# Patient Record
Sex: Male | Born: 1945 | Race: White | Hispanic: No | Marital: Married | Smoking: Former smoker
Health system: Southern US, Community
[De-identification: ages and names within clinical notes are randomized; demographics above are authoritative.]

## PROBLEM LIST (undated history)

## (undated) DIAGNOSIS — Z8711 Personal history of peptic ulcer disease: Secondary | ICD-10-CM

## (undated) DIAGNOSIS — R195 Other fecal abnormalities: Secondary | ICD-10-CM

## (undated) DIAGNOSIS — B9681 Helicobacter pylori [H. pylori] as the cause of diseases classified elsewhere: Secondary | ICD-10-CM

## (undated) DIAGNOSIS — I255 Ischemic cardiomyopathy: Secondary | ICD-10-CM

## (undated) DIAGNOSIS — Z720 Tobacco use: Secondary | ICD-10-CM

## (undated) DIAGNOSIS — Z8739 Personal history of other diseases of the musculoskeletal system and connective tissue: Secondary | ICD-10-CM

## (undated) DIAGNOSIS — G4730 Sleep apnea, unspecified: Secondary | ICD-10-CM

## (undated) DIAGNOSIS — E785 Hyperlipidemia, unspecified: Secondary | ICD-10-CM

## (undated) DIAGNOSIS — I2510 Atherosclerotic heart disease of native coronary artery without angina pectoris: Secondary | ICD-10-CM

## (undated) DIAGNOSIS — K2970 Gastritis, unspecified, without bleeding: Secondary | ICD-10-CM

## (undated) DIAGNOSIS — T7840XA Allergy, unspecified, initial encounter: Secondary | ICD-10-CM

## (undated) DIAGNOSIS — K250 Acute gastric ulcer with hemorrhage: Secondary | ICD-10-CM

## (undated) DIAGNOSIS — G4733 Obstructive sleep apnea (adult) (pediatric): Secondary | ICD-10-CM

## (undated) DIAGNOSIS — I10 Essential (primary) hypertension: Secondary | ICD-10-CM

## (undated) DIAGNOSIS — R011 Cardiac murmur, unspecified: Secondary | ICD-10-CM

## (undated) DIAGNOSIS — F1020 Alcohol dependence, uncomplicated: Secondary | ICD-10-CM

## (undated) DIAGNOSIS — F418 Other specified anxiety disorders: Secondary | ICD-10-CM

## (undated) DIAGNOSIS — C4492 Squamous cell carcinoma of skin, unspecified: Secondary | ICD-10-CM

## (undated) DIAGNOSIS — J3089 Other allergic rhinitis: Secondary | ICD-10-CM

## (undated) DIAGNOSIS — Z8679 Personal history of other diseases of the circulatory system: Secondary | ICD-10-CM

## (undated) DIAGNOSIS — Z8614 Personal history of Methicillin resistant Staphylococcus aureus infection: Secondary | ICD-10-CM

## (undated) DIAGNOSIS — Z8774 Personal history of (corrected) congenital malformations of heart and circulatory system: Secondary | ICD-10-CM

## (undated) HISTORY — DX: Alcohol dependence, uncomplicated: F10.20

## (undated) HISTORY — PX: MOHS SURGERY: SUR867

## (undated) HISTORY — DX: Sleep apnea, unspecified: G47.30

## (undated) HISTORY — DX: Other fecal abnormalities: R19.5

## (undated) HISTORY — PX: COLONOSCOPY: SHX174

## (undated) HISTORY — DX: Allergy, unspecified, initial encounter: T78.40XA

## (undated) HISTORY — DX: Personal history of other diseases of the circulatory system: Z86.79

## (undated) HISTORY — DX: Essential (primary) hypertension: I10

## (undated) HISTORY — DX: Other allergic rhinitis: J30.89

## (undated) HISTORY — DX: Obstructive sleep apnea (adult) (pediatric): G47.33

## (undated) HISTORY — DX: Tobacco use: Z72.0

## (undated) HISTORY — DX: Gastritis, unspecified, without bleeding: K29.70

## (undated) HISTORY — DX: Personal history of peptic ulcer disease: Z87.11

## (undated) HISTORY — DX: Acute gastric ulcer with hemorrhage: K25.0

## (undated) HISTORY — DX: Personal history of other diseases of the musculoskeletal system and connective tissue: Z87.39

## (undated) HISTORY — DX: Helicobacter pylori (H. pylori) as the cause of diseases classified elsewhere: B96.81

## (undated) HISTORY — DX: Other specified anxiety disorders: F41.8

## (undated) HISTORY — DX: Hyperlipidemia, unspecified: E78.5

## (undated) HISTORY — DX: Cardiac murmur, unspecified: R01.1

## (undated) HISTORY — DX: Personal history of (corrected) congenital malformations of heart and circulatory system: Z87.74

## (undated) HISTORY — DX: Atherosclerotic heart disease of native coronary artery without angina pectoris: I25.10

## (undated) HISTORY — PX: OTHER SURGICAL HISTORY: SHX169

## (undated) HISTORY — DX: Personal history of Methicillin resistant Staphylococcus aureus infection: Z86.14

## (undated) HISTORY — DX: Ischemic cardiomyopathy: I25.5

## (undated) HISTORY — PX: UVULECTOMY: SHX2631

---
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
## 1964-10-05 HISTORY — PX: ASD REPAIR, OSTIUM PRIMUM: SHX1194

## 1964-10-05 HISTORY — PX: OTHER SURGICAL HISTORY: SHX169

## 1998-10-05 HISTORY — PX: PALATE / UVULA BIOPSY / EXCISION: SUR128

## 1999-10-06 DIAGNOSIS — K250 Acute gastric ulcer with hemorrhage: Secondary | ICD-10-CM

## 1999-10-06 HISTORY — DX: Acute gastric ulcer with hemorrhage: K25.0

## 2004-09-22 ENCOUNTER — Ambulatory Visit: Payer: Self-pay | Admitting: Internal Medicine

## 2007-07-12 ENCOUNTER — Ambulatory Visit: Payer: Self-pay | Admitting: Gastroenterology

## 2008-03-27 ENCOUNTER — Ambulatory Visit: Payer: Self-pay | Admitting: Internal Medicine

## 2010-12-18 ENCOUNTER — Ambulatory Visit: Payer: Self-pay | Admitting: Gastroenterology

## 2010-12-22 LAB — PATHOLOGY REPORT

## 2011-10-06 HISTORY — PX: EAR BIOPSY: SHX1480

## 2012-06-23 DIAGNOSIS — Z85828 Personal history of other malignant neoplasm of skin: Secondary | ICD-10-CM | POA: Insufficient documentation

## 2014-03-08 ENCOUNTER — Ambulatory Visit: Payer: Self-pay | Admitting: Gastroenterology

## 2014-10-15 ENCOUNTER — Encounter: Payer: Self-pay | Admitting: Internal Medicine

## 2014-10-15 ENCOUNTER — Ambulatory Visit (INDEPENDENT_AMBULATORY_CARE_PROVIDER_SITE_OTHER): Payer: Commercial Managed Care - HMO | Admitting: Internal Medicine

## 2014-10-15 VITALS — BP 130/80 | HR 90 | Temp 97.6°F | Resp 20 | Ht 71.85 in | Wt 221.8 lb

## 2014-10-15 DIAGNOSIS — C801 Malignant (primary) neoplasm, unspecified: Secondary | ICD-10-CM

## 2014-10-15 DIAGNOSIS — K5909 Other constipation: Secondary | ICD-10-CM | POA: Insufficient documentation

## 2014-10-15 DIAGNOSIS — Z720 Tobacco use: Secondary | ICD-10-CM | POA: Insufficient documentation

## 2014-10-15 DIAGNOSIS — Z9989 Dependence on other enabling machines and devices: Secondary | ICD-10-CM

## 2014-10-15 DIAGNOSIS — I10 Essential (primary) hypertension: Secondary | ICD-10-CM

## 2014-10-15 DIAGNOSIS — R739 Hyperglycemia, unspecified: Secondary | ICD-10-CM

## 2014-10-15 DIAGNOSIS — K5900 Constipation, unspecified: Secondary | ICD-10-CM

## 2014-10-15 DIAGNOSIS — G4733 Obstructive sleep apnea (adult) (pediatric): Secondary | ICD-10-CM

## 2014-10-15 DIAGNOSIS — E785 Hyperlipidemia, unspecified: Secondary | ICD-10-CM | POA: Insufficient documentation

## 2014-10-15 DIAGNOSIS — Z23 Encounter for immunization: Secondary | ICD-10-CM

## 2014-10-15 DIAGNOSIS — IMO0002 Reserved for concepts with insufficient information to code with codable children: Secondary | ICD-10-CM | POA: Insufficient documentation

## 2014-10-15 MED ORDER — ZOSTER VACCINE LIVE 19400 UNT/0.65ML ~~LOC~~ SOLR: 0.6500 mL | Freq: Once | SUBCUTANEOUS | Status: DC

## 2014-10-15 NOTE — Patient Instructions (Signed)
Please bring me a copy of your living will and health care power of attorney and DNR forms.

## 2014-10-15 NOTE — Progress Notes (Signed)
Patient ID: Mitchell Yagi., male   DOB: Oct 21, 1945, 69 y.o.   MRN: [id number]   Location:  [facility]  Code Status: Has living will and HCPOA.    No Known Allergies  Chief Complaint  Patient presents with  . Establish Care    HPI: Patient is a 69 y.o. white male seen in the office today to establish here.    His physician moved to administration so needs new physician.  Wanted internist/geriatrician.    No concerns, but needs referral to Dr. Allyson Sabal, et al, for f/u visit in Feb/March for skin lesion on leg and has new area on right forearm.Had squamous on nose, side of eye and left upper ear.    Has been on verapamil and hctz for many years for his blood pressure--probably 30-35 years.  Had been borderline at that point.    BP stays 130/80 or lower.    Hyperlipidemia--zocor just changed from vytorin previously.  Labs had been great on the vytorin.     Sleep apnea--diagnosed 9-10 years ago.  Uses his CPAP each night.  Starts at 4, but titrates up to 6.8-30mmHg.  Seems to require less sleep since using--6.5-7 hrs.    Smokes about 1 pack every 4 days.  Has held onto this.  Previously drank heavily.  Tries to avoid using strong pain medications for pain except with surgery.  Is wintering with wife at [place] to walk 3 miles per day while there.  Also golfs in good weather.  Otherwise sedentary.  Does use stairs instead of elevator.    Diet was good until Christmas--gained 5-6 lbs over the holidays.  Tries to watch it.  Loves sweets.  No diabetes in family.  Has run at the upper limits of normal with glucose typically.  Has never been over 130.  He would like to lose 10-15 lbs.  Cscope negative for polyps last year (7/15).  Was a diagnostic cscope b/c had polyps in the past--none on the last two.  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  Goes every 3 years recently.  Dr. Candace Cruise.    Review of Systems:  Review of Systems    HENT: Positive for tinnitus. Negative for hearing loss.        Saw ENT about this and was advised probably meniere's--forgets about it so "not that bad"  Eyes: Positive for blurred vision.       Time for visit--has been 4-5 years since contact Rx changed;  Has bad astigmatism  Respiratory: Positive for wheezing.        None recently  Cardiovascular: Positive for palpitations.       Hypertension  Gastrointestinal: Positive for constipation.       Uses senna--only constipated for the past couple of years  Musculoskeletal: Positive for back pain and joint pain.       Primarily right knee after walking when golfing;  Some aches too;  L5 disc protrusion (cannot lift heavy) and goes out and down right leg sometimes--sees chiropractor--had myelogram at [facility] many years ago.    Endo/Heme/Allergies: Positive for environmental allergies.       And typically gets sinus infection also  Psychiatric/Behavioral: Negative for depression and memory loss. The patient is not nervous/anxious.        Had a bout of depression over a year ago, sought help--used zoloft low dose, then wellbutrin which he is also using for smoking cessation   Past  Medical History  Diagnosis Date  . Hypertension   . Hyperlipidemia   . Obstructive sleep apnea     on CPAP  . Gastric ulcer, acute with hemorrhage 2001    Dr. Sharlet Salina, [facility]    Past Surgical History  Procedure Laterality Date  . Palate / uvula biopsy / excision  2000    Dr.Sprenhe [facility])  . Tonsillectomy and adenoidectomy  1959  . 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 Open heart surgery  1966    Dr. Annamaria Boots ([facility]); was PFO repair--has been normal in f/u    Social History:   reports that he has been smoking.  He has quit using smokeless tobacco. He reports that he does not drink alcohol or use illicit drugs.  Family History  Problem Relation Age of Onset  . Failure to thrive Mother   . Arthritis Mother     Medications: Patient's  Medications  New Prescriptions   No medications on file  Previous Medications   HYDROCHLOROTHIAZIDE (HYDRODIURIL) 25 MG TABLET    Take 25 mg by mouth daily.   SENNA (SENOKOT) 8.6 MG TABLET    Take 1 tablet by mouth at bedtime.   SIMVASTATIN (ZOCOR) 40 MG TABLET    Take 40 mg by mouth daily.   VERAPAMIL (CALAN-SR) 240 MG CR TABLET    Take 480 mg by mouth daily.  Modified Medications   No medications on file  Discontinued Medications   BUPROPION (WELLBUTRIN XL) 150 MG 24 HR TABLET    Take 40 mg by mouth daily.      Physical Exam: Filed Vitals:   10/15/14 0911  BP: 130/80  Pulse: 90  Temp: 97.6 F (36.4 C)  TempSrc: Oral  Resp: 20  Height: 5' 11.85" (1.825 m)  Weight: 221 lb 12.8 oz (100.608 kg)  SpO2: 96%  Physical Exam  Constitutional: He is oriented to person, place, and time. He appears well-developed and well-nourished. No distress.  HENT:  Head: Normocephalic and atraumatic.  Eyes: Pupils are equal, round, and reactive to light.  wears glasses  Cardiovascular: Normal rate, regular rhythm, normal heart sounds and intact distal pulses.   Pulmonary/Chest: Effort normal and breath sounds normal. No respiratory distress.  Abdominal: Bowel sounds are normal.  Musculoskeletal: Normal range of motion.  Neurological: He is alert and oriented to person, place, and time.  Psychiatric: He has a normal mood and affect.    Labs reviewed: Has every 6 mos;  Will need to check when records received. Gets PSA annually--has been normal.  Past Procedures: Need records  Assessment/Plan 1. Essential hypertension, benign -bp at goal with current therapy, cont to monitor  2. Squamous cell carcinoma -has new swelling on right forearm of unknown etiology -has had several excisions of squamous cells before -sees Dr. Ledell Peoples group - Ambulatory referral to Dermatology  3. Hyperlipidemia -cont zocor, probably needs FLP drawn, but unknown when last done until we get labs  4.  Hyperglycemia -will need f/u hba1c next draw -is working on diet and exercise while at the beach  5. Tobacco abuse -ongoing, smoking less than he once did, but just has not reached the point where he is ready to stop -continues on wellbutrin  6. Chronic constipation -bowels have been slow-moving with hard stools-he thinks maybe it's been from stool softener abuse over the years  7. Obstructive sleep apnea on CPAP - cont cpap at hs  8. Need for zoster vaccination - zoster vaccine live,  PF, (ZOSTAVAX) 79432 UNT/0.65ML injection; Inject 19,400 Units into the skin once.  Dispense: 1 each; Refill: 0  Labs/tests ordered: will order labs for before next visit when records received.  Next appt:  3 mos with labs before  [place], D.O. [facility] [address], [place] [address] Cell Phone (Mon-Fri 8am-5pm):  [phone] On Call:  [phone] & follow prompts after 5pm & weekends Office Phone:  [phone] Office Fax:  [phone]

## 2015-01-10 ENCOUNTER — Other Ambulatory Visit: Payer: Commercial Managed Care - HMO

## 2015-01-10 DIAGNOSIS — E785 Hyperlipidemia, unspecified: Secondary | ICD-10-CM

## 2015-01-10 DIAGNOSIS — I10 Essential (primary) hypertension: Secondary | ICD-10-CM

## 2015-01-10 DIAGNOSIS — R739 Hyperglycemia, unspecified: Secondary | ICD-10-CM

## 2015-01-11 LAB — COMPREHENSIVE METABOLIC PANEL
ALT: 19 IU/L (ref 0–44)
AST: 18 IU/L (ref 0–40)
Albumin/Globulin Ratio: 2.1 (ref 1.1–2.5)
Albumin: 4.5 g/dL (ref 3.6–4.8)
Alkaline Phosphatase: 58 IU/L (ref 39–117)
BUN/Creatinine Ratio: 10 (ref 10–22)
BUN: 10 mg/dL (ref 8–27)
Bilirubin Total: 0.6 mg/dL (ref 0.0–1.2)
CO2: 26 mmol/L (ref 18–29)
Calcium: 9.4 mg/dL (ref 8.6–10.2)
Chloride: 97 mmol/L (ref 97–108)
Creatinine, Ser: 1.01 mg/dL (ref 0.76–1.27)
GFR calc Af Amer: 87 mL/min/{1.73_m2} (ref >59)
GFR calc non Af Amer: 76 mL/min/{1.73_m2} (ref >59)
Globulin, Total: 2.1 g/dL (ref 1.5–4.5)
Glucose: 106 mg/dL — ABNORMAL HIGH (ref 65–99)
Potassium: 4.1 mmol/L (ref 3.5–5.2)
Sodium: 138 mmol/L (ref 134–144)
Total Protein: 6.6 g/dL (ref 6.0–8.5)

## 2015-01-11 LAB — LIPID PANEL
Chol/HDL Ratio: 3.6 ratio units (ref 0.0–5.0)
Cholesterol, Total: 153 mg/dL (ref 100–199)
HDL: 42 mg/dL (ref >39)
LDL Calculated: 71 mg/dL (ref 0–99)
Triglycerides: 202 mg/dL — ABNORMAL HIGH (ref 0–149)
VLDL Cholesterol Cal: 40 mg/dL (ref 5–40)

## 2015-01-11 LAB — HEMOGLOBIN A1C
Est. average glucose Bld gHb Est-mCnc: 131 mg/dL
Hgb A1c MFr Bld: 6.2 % — ABNORMAL HIGH (ref 4.8–5.6)

## 2015-01-14 ENCOUNTER — Ambulatory Visit
Admission: RE | Admit: 2015-01-14 | Discharge: 2015-01-14 | Disposition: A | Discharge status: Home / Self Care | Payer: Commercial Managed Care - HMO | Source: Ambulatory Visit | Attending: Internal Medicine | Admitting: Internal Medicine

## 2015-01-14 ENCOUNTER — Ambulatory Visit (INDEPENDENT_AMBULATORY_CARE_PROVIDER_SITE_OTHER): Payer: Commercial Managed Care - HMO | Admitting: Internal Medicine

## 2015-01-14 ENCOUNTER — Encounter: Payer: Self-pay | Admitting: Internal Medicine

## 2015-01-14 VITALS — BP 118/72 | HR 82 | Temp 97.8°F | Resp 20 | Ht 72.0 in | Wt 225.4 lb

## 2015-01-14 DIAGNOSIS — G4700 Insomnia, unspecified: Secondary | ICD-10-CM | POA: Diagnosis not present

## 2015-01-14 DIAGNOSIS — R739 Hyperglycemia, unspecified: Secondary | ICD-10-CM

## 2015-01-14 DIAGNOSIS — Z23 Encounter for immunization: Secondary | ICD-10-CM

## 2015-01-14 DIAGNOSIS — I10 Essential (primary) hypertension: Secondary | ICD-10-CM | POA: Diagnosis not present

## 2015-01-14 DIAGNOSIS — M25562 Pain in left knee: Secondary | ICD-10-CM

## 2015-01-14 DIAGNOSIS — E785 Hyperlipidemia, unspecified: Secondary | ICD-10-CM

## 2015-01-14 MED ORDER — ZOSTER VACCINE LIVE 19400 UNT/0.65ML ~~LOC~~ SOLR
0.6500 mL | Freq: Once | SUBCUTANEOUS | Status: DC
Start: 2015-01-14 — End: 2015-05-09

## 2015-01-14 NOTE — Patient Instructions (Addendum)
Try melatonin 10mg .   If ineffective, I will send in a prescription for trazodone.   Cut back on your sweets and starches which have elevated your sugar, triglycerides and caused you to gain weight.  I recommend you exercise 30 mins a day 5 days per week.

## 2015-01-14 NOTE — Progress Notes (Signed)
Patient ID: Mitchell Stephens., male   DOB: Jun 06, 1946, 69 y.o.   MRN: [id number]   Location:  [facility]  Goals of Care: Advanced Directive information Does patient have an advance directive?: No, Would patient like information on creating an advanced directive?: Yes - Educational materials given (at first visit, but not yet completed)   No Known Allergies  Chief Complaint  Patient presents with  . Medical Management of Chronic Issues    3 month follow-up,discuss labs (copy printed)    HPI: Patient is a 69 y.o. white male seen in the office today for his 3 month f/u visit.    Sugar average has been elevated for a long time.  Says he has gained instead of lost weight.  Had been doing 30 mins a day of walking, but hurt back and couldn't walk then after that.    Plays golf.  When goes down steps or down a hill.  Hurts laterally and beneath patella in left knee.  Goes down sideways.  Has had difficulty sleeping in the past.  Does not like ambien.  Used to take 1/2 a zolpidem.  Is going to take a trip to [place] and plans to sleep in truck bed.  Wonders about trazodone.  Does not want addictive meds.  Normally has sinus problems--says it's amazing he's made it through March which is unusual.  Sees [place].    Notes TG probably up related to candy and sweets at [place].     Review of Systems:  Review of Systems  Constitutional: Negative for fever and chills.  HENT: Negative for hearing loss.   Eyes: Negative for blurred vision.  Respiratory: Negative for shortness of breath.   Cardiovascular: Negative for chest pain and leg swelling.  Gastrointestinal: Negative for abdominal pain, constipation, blood in stool and melena.  Genitourinary: Negative for dysuria.  Musculoskeletal: Positive for joint pain.       Left knee laterally when going downhill  Neurological: Negative for dizziness and loss of consciousness.    Psychiatric/Behavioral: Positive for depression. Negative for memory loss. The patient is nervous/anxious.     Past Medical History  Diagnosis Date  . Hypertension   . Hyperlipidemia   . Obstructive sleep apnea     on CPAP  . Gastric ulcer, acute with hemorrhage 2001    Dr. Sharlet Salina, [facility]    Past Surgical History  Procedure Laterality Date  . Palate / uvula biopsy / excision  2000    Dr.Sprenhe [facility])  . Tonsillectomy and adenoidectomy  1959  . 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 Open heart surgery  1966    Dr. Annamaria Boots ([facility]); was PFO repair--has been normal in f/u    Social History:   reports that he has been smoking.  He has quit using smokeless tobacco. He reports that he does not drink alcohol or use illicit drugs.  Family History  Problem Relation Age of Onset  . Failure to thrive Mother   . Arthritis Mother     Medications: Patient's Medications  New Prescriptions   No medications on file  Previous Medications   BUPROPION (WELLBUTRIN XL) 300 MG 24 HR TABLET    Take 300 mg by mouth daily.   COLCHICINE 0.6 MG TABLET    Take by mouth.   HYDROCHLOROTHIAZIDE (HYDRODIURIL) 25 MG TABLET    Take 25 mg by mouth daily.   SENNA (SENOKOT) 8.6 MG TABLET  Take 2 tablets by mouth at bedtime.   SIMVASTATIN (ZOCOR) 40 MG TABLET    Take 40 mg by mouth daily.   VERAPAMIL (CALAN-SR) 240 MG CR TABLET    Take 480 mg by mouth daily.  Modified Medications   Modified Medication Previous Medication   ZOSTER VACCINE LIVE, PF, (ZOSTAVAX) 00762 UNT/0.65ML INJECTION zoster vaccine live, PF, (ZOSTAVAX) 26333 UNT/0.65ML injection      Inject 19,400 Units into the skin once.    Inject 19,400 Units into the skin once.  Discontinued Medications   No medications on file     Physical Exam: Filed Vitals:   01/14/15 1315  BP: 118/72  Pulse: 82  Temp: 97.8 F (36.6 C)  TempSrc: Oral  Resp: 20  Height: 6' (1.829 m)  Weight: 225 lb 6.4 oz (102.241 kg)  SpO2: 98%   Physical Exam  Constitutional: He is oriented to person, place, and time. He appears well-developed and well-nourished. No distress.  Cardiovascular: Normal rate, regular rhythm, normal heart sounds and intact distal pulses.   Pulmonary/Chest: Effort normal and breath sounds normal. No respiratory distress.  Abdominal: Bowel sounds are normal.  Musculoskeletal: Normal range of motion. He exhibits tenderness.  Lateral aspect of left knee over tibial plateau and distal to patella  Neurological: He is alert and oriented to person, place, and time.  Skin: Skin is warm and dry.     Labs reviewed: Basic Metabolic Panel:  Recent Labs  01/10/15 0920  NA 138  K 4.1  CL 97  CO2 26  GLUCOSE 106*  BUN 10  CREATININE 1.01  CALCIUM 9.4   Liver Function Tests:  Recent Labs  01/10/15 0920  AST 18  ALT 19  ALKPHOS 58  BILITOT 0.6  PROT 6.6   No results for input(s): LIPASE, AMYLASE in the last 8760 hours. No results for input(s): AMMONIA in the last 8760 hours. CBC: No results for input(s): WBC, NEUTROABS, HGB, HCT, MCV, PLT in the last 8760 hours. Lipid Panel:  Recent Labs  01/10/15 0920  CHOL 153  HDL 42  LDLCALC 71  TRIG 202*  CHOLHDL 3.6   Lab Results  Component Value Date   HGBA1C 6.2* 01/10/2015   Assessment/Plan 1. Hyperglycemia - counseled extensively on diet and exercise today--seems motivated to make changes - Hemoglobin A1c; Future - Basic metabolic panel; Future  2. Essential hypertension, benign -bp at goal on current therapy, no changes  3. Hyperlipidemia -TG elevated this time after Easter candy -going to work on his dietary changes, add more salmon and exercise regularly - Lipid panel; Future  4. Left lateral knee pain - worst with walking downhill or down steps -?osteophytes vs. meniscus - DG Knee Complete 4 Views Left; Future  5. Insomnia -advised to try melatonin 10mg , but if ineffective, I will prescribe him trazodone for sleep when he  goes camping  6. Need for zoster vaccination -Rx given again today b/c  - zoster vaccine live, PF, (ZOSTAVAX) 54562 UNT/0.65ML injection; Inject 19,400 Units into the skin once.  Dispense: 1 each; Refill: 0    Labs/tests ordered: Next appt:  Mitchell Pinedo L. Connelly [place], D.O. [facility] [address], [place] [address] Cell Phone (Mon-Fri 8am-5pm):  [phone] On Call:  [phone] & follow prompts after 5pm & weekends Office Phone:  [phone] Office Fax:  [phone]

## 2015-02-07 ENCOUNTER — Telehealth: Payer: Self-pay

## 2015-02-07 DIAGNOSIS — Z0100 Encounter for examination of eyes and vision without abnormal findings: Secondary | ICD-10-CM

## 2015-02-07 NOTE — Telephone Encounter (Signed)
Patient needs referral to eye doctor, patient is already established with Dr.Allen Romine at [facility]

## 2015-02-11 ENCOUNTER — Other Ambulatory Visit: Payer: Self-pay

## 2015-02-11 MED ORDER — SIMVASTATIN 40 MG PO TABS: 40.0000 mg | ORAL_TABLET | Freq: Every day | ORAL | Status: DC

## 2015-02-11 MED ORDER — HYDROCHLOROTHIAZIDE 25 MG PO TABS: 25.0000 mg | ORAL_TABLET | Freq: Every day | ORAL | Status: DC

## 2015-02-11 MED ORDER — BUPROPION HCL ER (XL) 300 MG PO TB24: 300.0000 mg | ORAL_TABLET | Freq: Every day | ORAL | Status: DC

## 2015-02-11 MED ORDER — VERAPAMIL HCL ER 240 MG PO TBCR: 480.0000 mg | EXTENDED_RELEASE_TABLET | Freq: Every day | ORAL | Status: DC

## 2015-02-22 ENCOUNTER — Other Ambulatory Visit: Payer: Self-pay | Admitting: *Deleted

## 2015-02-22 MED ORDER — VERAPAMIL HCL ER 240 MG PO TBCR: 480.0000 mg | EXTENDED_RELEASE_TABLET | Freq: Every day | ORAL | Status: DC

## 2015-02-22 NOTE — Telephone Encounter (Signed)
Patient needed a local pharmacy supply due to [facility] sending his medication to wrong address.

## 2015-03-21 ENCOUNTER — Telehealth: Payer: Self-pay | Admitting: *Deleted

## 2015-03-21 MED ORDER — TRAZODONE HCL 50 MG PO TABS
ORAL_TABLET | ORAL | Status: DC
Start: 2015-03-21 — End: 2015-05-09

## 2015-03-21 NOTE — Telephone Encounter (Signed)
We can send trazodone 50mg  po qhs prn insomnia to his pharmacy [id number] with 1 refill.

## 2015-03-21 NOTE — Telephone Encounter (Signed)
Rx faxed to pharmacy and patient notified.

## 2015-03-21 NOTE — Telephone Encounter (Signed)
Patient called and stated that he is not sleeping. He has tried natural sleep aid-Melantonin with no relief. Stated that he is going out of town and wants to try the Trazodone. Stated that it was discussed at last appointment. Please Advise.

## 2015-05-06 ENCOUNTER — Other Ambulatory Visit: Payer: Commercial Managed Care - HMO

## 2015-05-06 DIAGNOSIS — E785 Hyperlipidemia, unspecified: Secondary | ICD-10-CM

## 2015-05-06 DIAGNOSIS — R739 Hyperglycemia, unspecified: Secondary | ICD-10-CM

## 2015-05-07 LAB — LIPID PANEL
Chol/HDL Ratio: 2.9 ratio units (ref 0.0–5.0)
Cholesterol, Total: 118 mg/dL (ref 100–199)
HDL: 41 mg/dL (ref >39)
LDL Calculated: 56 mg/dL (ref 0–99)
Triglycerides: 103 mg/dL (ref 0–149)
VLDL Cholesterol Cal: 21 mg/dL (ref 5–40)

## 2015-05-07 LAB — BASIC METABOLIC PANEL
BUN/Creatinine Ratio: 19 (ref 10–22)
BUN: 18 mg/dL (ref 8–27)
CO2: 24 mmol/L (ref 18–29)
Calcium: 9.6 mg/dL (ref 8.6–10.2)
Chloride: 99 mmol/L (ref 97–108)
Creatinine, Ser: 0.96 mg/dL (ref 0.76–1.27)
GFR calc Af Amer: 93 mL/min/{1.73_m2} (ref >59)
GFR calc non Af Amer: 80 mL/min/{1.73_m2} (ref >59)
Glucose: 111 mg/dL — ABNORMAL HIGH (ref 65–99)
Potassium: 3.9 mmol/L (ref 3.5–5.2)
Sodium: 140 mmol/L (ref 134–144)

## 2015-05-07 LAB — HEMOGLOBIN A1C
Est. average glucose Bld gHb Est-mCnc: 123 mg/dL
Hgb A1c MFr Bld: 5.9 % — ABNORMAL HIGH (ref 4.8–5.6)

## 2015-05-09 ENCOUNTER — Ambulatory Visit (INDEPENDENT_AMBULATORY_CARE_PROVIDER_SITE_OTHER): Payer: Commercial Managed Care - HMO | Admitting: Internal Medicine

## 2015-05-09 ENCOUNTER — Encounter: Payer: Self-pay | Admitting: Internal Medicine

## 2015-05-09 VITALS — BP 122/70 | HR 79 | Temp 97.9°F | Resp 16 | Ht 72.0 in | Wt 212.0 lb

## 2015-05-09 DIAGNOSIS — Z23 Encounter for immunization: Secondary | ICD-10-CM

## 2015-05-09 DIAGNOSIS — M79661 Pain in right lower leg: Secondary | ICD-10-CM | POA: Diagnosis not present

## 2015-05-09 DIAGNOSIS — G4700 Insomnia, unspecified: Secondary | ICD-10-CM | POA: Diagnosis not present

## 2015-05-09 DIAGNOSIS — Z0000 Encounter for general adult medical examination without abnormal findings: Secondary | ICD-10-CM | POA: Diagnosis not present

## 2015-05-09 DIAGNOSIS — R739 Hyperglycemia, unspecified: Secondary | ICD-10-CM | POA: Diagnosis not present

## 2015-05-09 DIAGNOSIS — G4733 Obstructive sleep apnea (adult) (pediatric): Secondary | ICD-10-CM

## 2015-05-09 DIAGNOSIS — Z9989 Dependence on other enabling machines and devices: Secondary | ICD-10-CM

## 2015-05-09 DIAGNOSIS — I10 Essential (primary) hypertension: Secondary | ICD-10-CM | POA: Diagnosis not present

## 2015-05-09 DIAGNOSIS — E785 Hyperlipidemia, unspecified: Secondary | ICD-10-CM

## 2015-05-09 MED ORDER — BUPROPION HCL ER (XL) 300 MG PO TB24: 300.0000 mg | ORAL_TABLET | Freq: Every day | ORAL | Status: DC

## 2015-05-09 MED ORDER — COLCHICINE 0.6 MG PO TABS: ORAL_TABLET | ORAL | Status: DC

## 2015-05-09 MED ORDER — SIMVASTATIN 40 MG PO TABS: 40.0000 mg | ORAL_TABLET | Freq: Every day | ORAL | Status: DC

## 2015-05-09 MED ORDER — VERAPAMIL HCL ER 240 MG PO TBCR: 480.0000 mg | EXTENDED_RELEASE_TABLET | Freq: Every day | ORAL | Status: DC

## 2015-05-09 MED ORDER — TRAZODONE HCL 50 MG PO TABS: ORAL_TABLET | ORAL | Status: DC

## 2015-05-09 MED ORDER — HYDROCHLOROTHIAZIDE 25 MG PO TABS: 25.0000 mg | ORAL_TABLET | Freq: Every day | ORAL | Status: DC

## 2015-05-09 MED ORDER — ZOSTER VACCINE LIVE 19400 UNT/0.65ML ~~LOC~~ SOLR: 0.6500 mL | Freq: Once | SUBCUTANEOUS | Status: DC

## 2015-05-09 NOTE — Progress Notes (Signed)
Passed clock drawing 
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
## 2015-05-14 ENCOUNTER — Ambulatory Visit
Admission: RE | Admit: 2015-05-14 | Discharge: 2015-05-14 | Disposition: A | Discharge status: Home / Self Care | Payer: Commercial Managed Care - HMO | Source: Ambulatory Visit | Attending: Internal Medicine | Admitting: Internal Medicine

## 2015-05-14 DIAGNOSIS — M79661 Pain in right lower leg: Secondary | ICD-10-CM

## 2015-08-27 ENCOUNTER — Ambulatory Visit (INDEPENDENT_AMBULATORY_CARE_PROVIDER_SITE_OTHER): Payer: Commercial Managed Care - HMO | Admitting: Nurse Practitioner

## 2015-08-27 ENCOUNTER — Encounter: Payer: Self-pay | Admitting: Nurse Practitioner

## 2015-08-27 ENCOUNTER — Telehealth: Payer: Self-pay

## 2015-08-27 VITALS — BP 128/76 | HR 82 | Temp 98.0°F | Resp 18 | Ht 72.0 in | Wt 222.2 lb

## 2015-08-27 DIAGNOSIS — Z23 Encounter for immunization: Secondary | ICD-10-CM

## 2015-08-27 DIAGNOSIS — M25561 Pain in right knee: Secondary | ICD-10-CM | POA: Diagnosis not present

## 2015-08-27 MED ORDER — TRAZODONE HCL 50 MG PO TABS: ORAL_TABLET | ORAL | Status: DC

## 2015-08-27 NOTE — Telephone Encounter (Signed)
Called patient spoke with him about his having an x-ray of his knee done before any injections or anything else could be done he said he would follow [name]'s instructions and if it did not help he would call us back for another appointment. [name] was verbally informed of this.

## 2015-08-27 NOTE — Progress Notes (Signed)
Patient ID: Mitchell Frame., male   DOB: 09/22/1946, 69 y.o.   MRN: [id number]    PCP: Hollace Kinnier, DO  Advanced Directive information Does patient have an advance directive?: Yes  No Known Allergies  Chief Complaint  Patient presents with  . Acute Visit    Patient c/o Knee pain has had for about 3 weeks and is geting worse   . OTHER    came also for flu shot today     HPI: Patient is a 69 y.o. male seen in the office today for right knee pain. Unsure of initial injury but reports he was playing golf and had to stop after 7 holes. Very active normally but stopping him from doing activity. Does not remember popping and intense pain at any point. Knee pain has been going on for about a month.  Hurting on the lateral aspect of knee. Hard to go up stairs and hard to go down stairs. Feels like the knee catches and has increased pain. Does not feel unstable but feels like knee is hyperextended compared to left leg. Still able to walk and sitting in the chair without pain Has tired antiinflammatory off and on for the first 5 days but not routinely does not feel like it helped   Review of Systems:  Review of Systems  Musculoskeletal: Positive for arthralgias (right knee pain). Negative for back pain, joint swelling and gait problem.  All other systems reviewed and are negative.   Past Medical History  Diagnosis Date  . Hypertension   . Hyperlipidemia   . Obstructive sleep apnea     on CPAP  . Gastric ulcer, acute with hemorrhage 2001    Dr. Sharlet Salina, [facility]   Past Surgical History  Procedure Laterality Date  . Palate / uvula biopsy / excision  2000    Dr.Sprenhe [facility])  . Tonsillectomy and adenoidectomy  1959  . 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 Open heart surgery  1966    Dr. Annamaria Boots ([facility]); was PFO repair--has been normal in f/u   Social History:   reports that he has been smoking.  He has quit using smokeless tobacco. He reports that he does  not drink alcohol or use illicit drugs.  Family History  Problem Relation Age of Onset  . Failure to thrive Mother   . Arthritis Mother     Medications: Patient's Medications  New Prescriptions   No medications on file  Previous Medications   BUPROPION (WELLBUTRIN XL) 300 MG 24 HR TABLET    Take 1 tablet (300 mg total) by mouth daily.   COLCHICINE 0.6 MG TABLET    1 by mouth daily as needed for acute gout attack   HYDROCHLOROTHIAZIDE (HYDRODIURIL) 25 MG TABLET    Take 1 tablet (25 mg total) by mouth daily.   SENNA (SENOKOT) 8.6 MG TABLET    Take 2 tablets by mouth at bedtime.   SIMVASTATIN (ZOCOR) 40 MG TABLET    Take 1 tablet (40 mg total) by mouth daily.   TRAZODONE (DESYREL) 50 MG TABLET    Take one tablet by mouth at bedtime as needed for insomnia   VERAPAMIL (CALAN-SR) 240 MG CR TABLET    Take 2 tablets (480 mg total) by mouth daily.   ZOSTER VACCINE LIVE, PF, (ZOSTAVAX) 60454 UNT/0.65ML INJECTION    Inject 19,400 Units into the skin once.  Modified Medications   No medications on file  Discontinued Medications  No medications on file     Physical Exam:  Filed Vitals:   08/27/15 1540  BP: 128/76  Pulse: 82  Temp: 98 F (36.7 C)  TempSrc: Oral  Resp: 18  Height: 6' (1.829 m)  Weight: 222 lb 3.2 oz (100.789 kg)  SpO2: 98%   Body mass index is 30.13 kg/(m^2).  Physical Exam  Constitutional: He is oriented to person, place, and time. He appears well-developed and well-nourished. No distress.  Musculoskeletal:       Right knee: He exhibits normal range of motion, no swelling, no effusion, no ecchymosis, no deformity, no erythema, normal alignment, no LCL laxity, no bony tenderness and normal meniscus.  Point tenderness noted to lateral lower knee, no crepitus noted to knee  Neurological: He is alert and oriented to person, place, and time.  Skin: He is not diaphoretic.    Labs reviewed: Basic Metabolic Panel:  Recent Labs  01/10/15 0920 05/06/15 1045  NA  138 140  K 4.1 3.9  CL 97 99  CO2 26 24  GLUCOSE 106* 111*  BUN 10 18  CREATININE 1.01 0.96  CALCIUM 9.4 9.6   Liver Function Tests:  Recent Labs  01/10/15 0920  AST 18  ALT 19  ALKPHOS 58  BILITOT 0.6  PROT 6.6  ALBUMIN 4.5   No results for input(s): LIPASE, AMYLASE in the last 8760 hours. No results for input(s): AMMONIA in the last 8760 hours. CBC: No results for input(s): WBC, NEUTROABS, HGB, HCT, MCV, PLT in the last 8760 hours. Lipid Panel:  Recent Labs  01/10/15 0920 05/06/15 1045  CHOL 153 118  HDL 42 41  LDLCALC 71 56  TRIG 202* 103  CHOLHDL 3.6 2.9   TSH: No results for input(s): TSH in the last 8760 hours. A1C: Lab Results  Component Value Date   HGBA1C 5.9* 05/06/2015     Assessment/Plan 1. Need for prophylactic vaccination and inoculation against influenza - Flu Vaccine QUAD 36+ mos PF IM (Fluarix & Fluzone Quad PF)   2. Knee pain, acute, right Has been ongoing for ~3-4 weeks. No known injury, possibility due to overuse.  -may use aleve twice daily for 7 days -ice ~20 mins twice daily -to use muscle rubs as needed (after ice) -discussed possible PT but pt not interested at this time for strength/stretching  -discussed with Dr Mariea Clonts pts PCP, to get xray of knee to evaluate but pt would like to hold off on this at this time and let us know if pain persist.     [name] K. Harle Battiest  [facility] [phone] 8 am - 5 pm) [phone] (after hours)

## 2015-08-27 NOTE — Patient Instructions (Signed)
To use aleve twice daily routinely for 1 week Ice knee twice daily for ~20 mins Active stretching to leg but don't over use To use muscle rubs as needed  Knee Pain Knee pain is a very common symptom and can have many causes. Knee pain often goes away when you follow your health care provider's instructions for relieving pain and discomfort at home. However, knee pain can develop into a condition that needs treatment. Some conditions may include:  Arthritis caused by wear and tear (osteoarthritis).  Arthritis caused by swelling and irritation (rheumatoid arthritis or gout).  A cyst or growth in your knee.  An infection in your knee joint.  An injury that will not heal.  Damage, swelling, or irritation of the tissues that support your knee (torn ligaments or tendinitis). If your knee pain continues, additional tests may be ordered to diagnose your condition. Tests may include X-rays or other imaging studies of your knee. You may also need to have fluid removed from your knee. Treatment for ongoing knee pain depends on the cause, but treatment may include:  Medicines to relieve pain or swelling.  Steroid injections in your knee.  Physical therapy.  Surgery. HOME CARE INSTRUCTIONS  Take medicines only as directed by your health care provider.  Rest your knee and keep it raised (elevated) while you are resting.  Do not do things that cause or worsen pain.  Avoid high-impact activities or exercises, such as running, jumping rope, or doing jumping jacks.  Apply ice to the knee area:  Put ice in a plastic bag.  Place a towel between your skin and the bag.  Leave the ice on for 20 minutes, 2-3 times a day.  Ask your health care provider if you should wear an elastic knee support.  Keep a pillow under your knee when you sleep.  Lose weight if you are overweight. Extra weight can put pressure on your knee.  Do not use any tobacco products, including cigarettes, chewing  tobacco, or electronic cigarettes. If you need help quitting, ask your health care provider. Smoking may slow the healing of any bone and joint problems that you may have. SEEK MEDICAL CARE IF:  Your knee pain continues, changes, or gets worse.  You have a fever along with knee pain.  Your knee buckles or locks up.  Your knee becomes more swollen. SEEK IMMEDIATE MEDICAL CARE IF:   Your knee joint feels hot to the touch.  You have chest pain or trouble breathing.   This information is not intended to replace advice given to you by your health care provider. Make sure you discuss any questions you have with your health care provider.   Document Released: 07/19/2007 Document Revised: 10/12/2014 Document Reviewed: 05/07/2014 Elsevier Interactive Patient Education [employer].

## 2015-11-11 ENCOUNTER — Ambulatory Visit: Payer: Commercial Managed Care - HMO | Admitting: Internal Medicine

## 2016-01-01 ENCOUNTER — Telehealth: Payer: Self-pay

## 2016-01-01 NOTE — Telephone Encounter (Signed)
Submitted [facility] referral through [employer]  Auth# [id number] Valid 01/02/16 - 06/30/16 For 6 visits Dr. Kelly Splinter patient and left a detailed message that this was taken care of and left authorization information for the patient.

## 2016-01-01 NOTE — Telephone Encounter (Signed)
Patient called and left message indicating he needs a referral to Dr.Lupton. Patient already established and has a pending appointment on 01-08-16. Dorothy (referral coordinator informed)

## 2016-01-09 ENCOUNTER — Ambulatory Visit (INDEPENDENT_AMBULATORY_CARE_PROVIDER_SITE_OTHER): Payer: Commercial Managed Care - HMO | Admitting: Internal Medicine

## 2016-01-09 ENCOUNTER — Encounter: Payer: Self-pay | Admitting: Internal Medicine

## 2016-01-09 VITALS — BP 116/72 | HR 79 | Temp 97.7°F | Resp 20 | Ht 72.0 in | Wt 215.8 lb

## 2016-01-09 DIAGNOSIS — C801 Malignant (primary) neoplasm, unspecified: Secondary | ICD-10-CM | POA: Diagnosis not present

## 2016-01-09 DIAGNOSIS — M25541 Pain in joints of right hand: Secondary | ICD-10-CM

## 2016-01-09 DIAGNOSIS — R739 Hyperglycemia, unspecified: Secondary | ICD-10-CM | POA: Diagnosis not present

## 2016-01-09 DIAGNOSIS — Z720 Tobacco use: Secondary | ICD-10-CM | POA: Diagnosis not present

## 2016-01-09 DIAGNOSIS — Z23 Encounter for immunization: Secondary | ICD-10-CM

## 2016-01-09 DIAGNOSIS — M79641 Pain in right hand: Secondary | ICD-10-CM

## 2016-01-09 DIAGNOSIS — F325 Major depressive disorder, single episode, in full remission: Secondary | ICD-10-CM | POA: Diagnosis not present

## 2016-01-09 DIAGNOSIS — E785 Hyperlipidemia, unspecified: Secondary | ICD-10-CM

## 2016-01-09 DIAGNOSIS — M25549 Pain in joints of unspecified hand: Secondary | ICD-10-CM | POA: Insufficient documentation

## 2016-01-09 DIAGNOSIS — IMO0002 Reserved for concepts with insufficient information to code with codable children: Secondary | ICD-10-CM

## 2016-01-09 DIAGNOSIS — I10 Essential (primary) hypertension: Secondary | ICD-10-CM | POA: Diagnosis not present

## 2016-01-09 MED ORDER — ZOSTER VACCINE LIVE 19400 UNT/0.65ML ~~LOC~~ SOLR: 0.6500 mL | Freq: Once | SUBCUTANEOUS | Status: DC
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
## 2016-01-10 LAB — CBC WITH DIFFERENTIAL/PLATELET
Basophils Absolute: 0.1 10*3/uL (ref 0.0–0.2)
Basos: 1 %
EOS (ABSOLUTE): 0.2 10*3/uL (ref 0.0–0.4)
Eos: 3 %
Hematocrit: 43.1 % (ref 37.5–51.0)
Hemoglobin: 14.8 g/dL (ref 12.6–17.7)
Immature Grans (Abs): 0 10*3/uL (ref 0.0–0.1)
Immature Granulocytes: 0 %
Lymphocytes Absolute: 1.9 10*3/uL (ref 0.7–3.1)
Lymphs: 29 %
MCH: 31 pg (ref 26.6–33.0)
MCHC: 34.3 g/dL (ref 31.5–35.7)
MCV: 90 fL (ref 79–97)
Monocytes Absolute: 0.6 10*3/uL (ref 0.1–0.9)
Monocytes: 9 %
Neutrophils Absolute: 3.6 10*3/uL (ref 1.4–7.0)
Neutrophils: 58 %
Platelets: 245 10*3/uL (ref 150–379)
RBC: 4.77 x10E6/uL (ref 4.14–5.80)
RDW: 13.6 % (ref 12.3–15.4)
WBC: 6.3 10*3/uL (ref 3.4–10.8)

## 2016-01-10 LAB — COMPREHENSIVE METABOLIC PANEL
ALT: 12 IU/L (ref 0–44)
AST: 13 IU/L (ref 0–40)
Albumin/Globulin Ratio: 2.3 — ABNORMAL HIGH (ref 1.2–2.2)
Albumin: 4.5 g/dL (ref 3.5–4.8)
Alkaline Phosphatase: 52 IU/L (ref 39–117)
BUN/Creatinine Ratio: 15 (ref 10–24)
BUN: 15 mg/dL (ref 8–27)
Bilirubin Total: 0.6 mg/dL (ref 0.0–1.2)
CO2: 24 mmol/L (ref 18–29)
Calcium: 9.8 mg/dL (ref 8.6–10.2)
Chloride: 99 mmol/L (ref 96–106)
Creatinine, Ser: 1.02 mg/dL (ref 0.76–1.27)
GFR calc Af Amer: 86 mL/min/{1.73_m2} (ref >59)
GFR calc non Af Amer: 74 mL/min/{1.73_m2} (ref >59)
Globulin, Total: 2 g/dL (ref 1.5–4.5)
Glucose: 102 mg/dL — ABNORMAL HIGH (ref 65–99)
Potassium: 4.4 mmol/L (ref 3.5–5.2)
Sodium: 139 mmol/L (ref 134–144)
Total Protein: 6.5 g/dL (ref 6.0–8.5)

## 2016-01-10 LAB — HEMOGLOBIN A1C
Est. average glucose Bld gHb Est-mCnc: 126 mg/dL
Hgb A1c MFr Bld: 6 % — ABNORMAL HIGH (ref 4.8–5.6)

## 2016-01-10 LAB — LIPID PANEL
Chol/HDL Ratio: 3 ratio units (ref 0.0–5.0)
Cholesterol, Total: 128 mg/dL (ref 100–199)
HDL: 43 mg/dL (ref >39)
LDL Calculated: 58 mg/dL (ref 0–99)
Triglycerides: 133 mg/dL (ref 0–149)
VLDL Cholesterol Cal: 27 mg/dL (ref 5–40)

## 2016-01-13 ENCOUNTER — Telehealth: Payer: Self-pay | Admitting: *Deleted

## 2016-01-13 DIAGNOSIS — M25541 Pain in joints of right hand: Secondary | ICD-10-CM

## 2016-01-13 NOTE — Telephone Encounter (Signed)
Patient called and stated that he needs a referral to Dr. Earnestine Leys (Orthopedic)  in [place] [phone] for his swelling in his joints in his hands. He stated that this was discussed in last OV. Please Advise.

## 2016-01-14 NOTE — Telephone Encounter (Signed)
The referral is there.  I see it.

## 2016-01-14 NOTE — Telephone Encounter (Signed)
I put the referral in in that same encounter I sent you.  Unless it disappeared.
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
## 2016-01-14 NOTE — Telephone Encounter (Signed)
Confused as to why this was forwarded to me there was no referral placed. If this was forwarded in order for me to do a referral that one needs to be entered into the system please.  Thank you

## 2016-01-17 DIAGNOSIS — M19049 Primary osteoarthritis, unspecified hand: Secondary | ICD-10-CM | POA: Insufficient documentation

## 2016-01-27 ENCOUNTER — Other Ambulatory Visit: Payer: Self-pay | Admitting: Internal Medicine

## 2016-02-06 ENCOUNTER — Encounter: Payer: Self-pay | Admitting: *Deleted

## 2016-02-20 ENCOUNTER — Encounter: Payer: Self-pay | Admitting: Internal Medicine

## 2016-03-18 ENCOUNTER — Telehealth: Payer: Self-pay | Admitting: *Deleted

## 2016-03-18 NOTE — Telephone Encounter (Signed)
Patient called and left message on voicemail stating that he needed a referral to [facility] [phone]. For a routine visit.   I called patient back and he stated to disregard the message because the visit was a NO CHARGE visit.

## 2016-03-26 ENCOUNTER — Other Ambulatory Visit: Payer: Self-pay | Admitting: Internal Medicine

## 2016-04-08 ENCOUNTER — Telehealth: Payer: Self-pay | Admitting: *Deleted

## 2016-04-08 NOTE — Telephone Encounter (Signed)
That is fine.  Give him a 21 day supply.

## 2016-04-08 NOTE — Telephone Encounter (Signed)
Spoke with Leroy Sea the pharmacist , if stated that he would take care of the order.

## 2016-04-08 NOTE — Telephone Encounter (Signed)
Patient called regarding having his Wellbutrin  XL 300 mg medication, he stated that he forgot this medication and he is on vacation in [place] for 21 mores days and requested if this medication can be called or faxed to the pharmacy in [place]. Please Advise!

## 2016-05-11 ENCOUNTER — Telehealth: Payer: Self-pay

## 2016-05-11 DIAGNOSIS — H6123 Impacted cerumen, bilateral: Secondary | ICD-10-CM

## 2016-05-11 NOTE — Telephone Encounter (Signed)
If he's agreeable, he can be seen here for exam and ear irrigation/cerumen removal.

## 2016-05-11 NOTE — Telephone Encounter (Signed)
.  left message to have patient return my call.  

## 2016-05-11 NOTE — Telephone Encounter (Signed)
Message left on triage voicemail: patient is returning Dee's phone call

## 2016-05-11 NOTE — Telephone Encounter (Signed)
Spoke with patient and he would like referral to [facility] they have been cleaning out his ear for years and they can also address the sinus infection. Per pt they are a mile from him and we are 22 miles.  Referral entered

## 2016-05-11 NOTE — Telephone Encounter (Signed)
Message left on triage voicemail: Patient with wax impaction in bot ears and ongoing sinus infection. Patient would like a referral to [facility] [place].  I reviewed chart and patient not seen recently for these concerns. Please advise if patient needs OV here first or if ok to place referral order.

## 2016-05-20 ENCOUNTER — Other Ambulatory Visit: Payer: Self-pay | Admitting: Internal Medicine

## 2016-05-22 ENCOUNTER — Encounter: Payer: Self-pay | Admitting: Internal Medicine

## 2016-05-22 ENCOUNTER — Ambulatory Visit (INDEPENDENT_AMBULATORY_CARE_PROVIDER_SITE_OTHER): Payer: Commercial Managed Care - HMO | Admitting: Internal Medicine

## 2016-05-22 VITALS — HR 79 | Temp 97.7°F | Wt 215.0 lb

## 2016-05-22 DIAGNOSIS — H6123 Impacted cerumen, bilateral: Secondary | ICD-10-CM

## 2016-05-22 MED ORDER — BUPROPION HCL ER (XL) 300 MG PO TB24: 300.0000 mg | ORAL_TABLET | Freq: Every day | ORAL | 0 refills | Status: DC

## 2016-05-22 NOTE — Progress Notes (Signed)
Patient ID: Mitchell Frame., male   DOB: [birth year], 70 y.o.   MRN: [id number] Patient came in today for removal of ear wax in both ears at a nurse visit--flushing was performed.

## 2016-06-09 ENCOUNTER — Other Ambulatory Visit: Payer: Self-pay | Admitting: Internal Medicine

## 2016-07-10 ENCOUNTER — Telehealth: Payer: Self-pay

## 2016-07-10 ENCOUNTER — Ambulatory Visit (INDEPENDENT_AMBULATORY_CARE_PROVIDER_SITE_OTHER): Payer: Commercial Managed Care - HMO

## 2016-07-10 DIAGNOSIS — Z23 Encounter for immunization: Secondary | ICD-10-CM

## 2016-07-10 MED ORDER — ZOSTER VACCINE LIVE 19400 UNT/0.65ML ~~LOC~~ SUSR: 0.6500 mL | Freq: Once | SUBCUTANEOUS | 0 refills | Status: AC

## 2016-07-10 NOTE — Telephone Encounter (Signed)
RX printed and given to patient

## 2016-07-29 ENCOUNTER — Telehealth: Payer: Self-pay | Admitting: Internal Medicine

## 2016-07-29 NOTE — Telephone Encounter (Signed)
left msg asking if pt can schedule AWV at 9:30 on 11/3 after OV. VDM (Dee-Dee)

## 2016-07-31 ENCOUNTER — Encounter: Payer: Commercial Managed Care - HMO | Admitting: Internal Medicine

## 2016-08-07 ENCOUNTER — Ambulatory Visit (INDEPENDENT_AMBULATORY_CARE_PROVIDER_SITE_OTHER): Payer: Commercial Managed Care - HMO | Admitting: Internal Medicine

## 2016-08-07 ENCOUNTER — Encounter: Payer: Self-pay | Admitting: Internal Medicine

## 2016-08-07 VITALS — BP 140/70 | HR 69 | Temp 97.6°F | Ht 72.0 in | Wt 211.0 lb

## 2016-08-07 DIAGNOSIS — I10 Essential (primary) hypertension: Secondary | ICD-10-CM | POA: Diagnosis not present

## 2016-08-07 DIAGNOSIS — Z6828 Body mass index (BMI) 28.0-28.9, adult: Secondary | ICD-10-CM

## 2016-08-07 DIAGNOSIS — E782 Mixed hyperlipidemia: Secondary | ICD-10-CM

## 2016-08-07 DIAGNOSIS — R739 Hyperglycemia, unspecified: Secondary | ICD-10-CM

## 2016-08-07 DIAGNOSIS — Z0000 Encounter for general adult medical examination without abnormal findings: Secondary | ICD-10-CM

## 2016-08-07 DIAGNOSIS — Z23 Encounter for immunization: Secondary | ICD-10-CM | POA: Diagnosis not present

## 2016-08-07 DIAGNOSIS — Z720 Tobacco use: Secondary | ICD-10-CM | POA: Diagnosis not present

## 2016-08-07 LAB — LIPID PANEL
Cholesterol: 125 mg/dL (ref 125–200)
HDL: 45 mg/dL (ref >=40)
LDL Cholesterol: 60 mg/dL (ref <130)
Total CHOL/HDL Ratio: 2.8 Ratio (ref <=5.0)
Triglycerides: 100 mg/dL (ref <150)
VLDL: 20 mg/dL (ref <30)

## 2016-08-07 LAB — CBC WITH DIFFERENTIAL/PLATELET
Basophils Absolute: 74 cells/uL (ref 0–200)
Basophils Relative: 1 %
Eosinophils Absolute: 296 cells/uL (ref 15–500)
Eosinophils Relative: 4 %
HCT: 42.9 % (ref 38.5–50.0)
Hemoglobin: 14.6 g/dL (ref 13.2–17.1)
Lymphocytes Relative: 26 %
Lymphs Abs: 1924 cells/uL (ref 850–3900)
MCH: 31.4 pg (ref 27.0–33.0)
MCHC: 34 g/dL (ref 32.0–36.0)
MCV: 92.3 fL (ref 80.0–100.0)
MPV: 10.2 fL (ref 7.5–12.5)
Monocytes Absolute: 592 cells/uL (ref 200–950)
Monocytes Relative: 8 %
Neutro Abs: 4514 cells/uL (ref 1500–7800)
Neutrophils Relative %: 61 %
Platelets: 268 10*3/uL (ref 140–400)
RBC: 4.65 MIL/uL (ref 4.20–5.80)
RDW: 13.5 % (ref 11.0–15.0)
WBC: 7.4 10*3/uL (ref 3.8–10.8)

## 2016-08-07 LAB — COMPLETE METABOLIC PANEL WITH GFR
ALT: 13 U/L (ref 9–46)
AST: 16 U/L (ref 10–35)
Albumin: 4.4 g/dL (ref 3.6–5.1)
Alkaline Phosphatase: 40 U/L (ref 40–115)
BUN: 22 mg/dL (ref 7–25)
CO2: 24 mmol/L (ref 20–31)
Calcium: 9.5 mg/dL (ref 8.6–10.3)
Chloride: 102 mmol/L (ref 98–110)
Creat: 1.04 mg/dL (ref 0.70–1.18)
GFR, Est African American: 83 mL/min (ref >=60)
GFR, Est Non African American: 72 mL/min (ref >=60)
Glucose, Bld: 95 mg/dL (ref 65–99)
Potassium: 3.8 mmol/L (ref 3.5–5.3)
Sodium: 138 mmol/L (ref 135–146)
Total Bilirubin: 0.7 mg/dL (ref 0.2–1.2)
Total Protein: 6.8 g/dL (ref 6.1–8.1)

## 2016-08-07 LAB — HEMOGLOBIN A1C
Hgb A1c MFr Bld: 5.5 % (ref <5.7)
Mean Plasma Glucose: 111 mg/dL

## 2016-08-07 NOTE — Progress Notes (Signed)
Location:  [facility] clinic Provider: Arren Laminack L. Mariea Clonts, D.O., C.M.D.  Patient Care Team: Gayland Curry, DO as PCP - General (Geriatric Medicine) [name], PA as Referring Physician (Dermatology) [name], MD as Referring Physician (Otolaryngology)  Extended Emergency Contact Information Primary Emergency Contact: [facility] S Address: [address]          [place], [place] [address] [name] of [place] Phone: [phone] Mobile Phone: [phone] Relation: Spouse  Code Status: DNR Goals of Care: Advanced Directive information Advanced Directives 08/07/2016  Does patient have an advance directive? Yes  Type of Advance Directive Living will  Does patient want to make changes to advanced directive? -  Copy of advanced directive(s) in chart? Yes  Would patient like information on creating an advanced directive? -   Chief Complaint  Patient presents with  . Annual Exam    physical exam  . MMSE    30/30 passed clock    HPI: Patient is a 70 y.o. male seen in today for an annual wellness exam.    Depression screen [facility] 2/9 08/07/2016 05/09/2015 01/14/2015  Decreased Interest 0 0 0  Down, Depressed, Hopeless 0 0 0  PHQ - 2 Score 0 0 0    Fall Risk  08/07/2016 01/09/2016 08/27/2015 05/09/2015 01/14/2015  Falls in the past year? No No No No No   MMSE - Mini Mental State Exam 08/07/2016 05/09/2015  Orientation to time 5 4  Orientation to Place 5 5  Registration 3 3  Attention/ Calculation 5 5  Recall 3 1  Language- name 2 objects 2 2  Language- repeat 1 1  Language- follow 3 step command 3 3  Language- read & follow direction 1 1  Write a sentence 1 1  Copy design 1 1  Total score 30 27  passed.   Health Maintenance  Topic Date Due  . Hepatitis C Screening  12-01-1945  . TETANUS/TDAP  07/31/1964  . ZOSTAVAX  07/31/2005  . PNA vac Low Risk Adult (2 of 2 - PPSV23) 10/05/2014  . COLONOSCOPY  03/22/2019  . INFLUENZA VACCINE  Completed    Functional Status Survey: Is the patient deaf or have difficulty hearing?: No (aware of some hearing loss (with a lot of other noise)) Does the patient have difficulty seeing, even when wearing glasses/contacts?: No Does the patient have difficulty concentrating, remembering, or making decisions?: No Does the patient have difficulty walking or climbing stairs?: No Does the patient have difficulty dressing or bathing?: No Does the patient have difficulty doing errands alone such as visiting a doctor's office or shopping?: No Current Exercise Habits: The patient has a physically strenous job, but has no regular exercise apart from work. Exercise limited by: None identified  Diet?  Does follow a balanced diet No exam data present  Had an eye exam in May of this year Hearing:  Slight hearing loss in public places but does not feel like he needs hearing aides at this point.   Dentition:  No problems  Still smokes occasionally.  Does not want to quit just yet. He says he will by the next visit.  Past Medical History:  Diagnosis Date  . Depression with anxiety   . Gastric ulcer, acute with hemorrhage 2001   Dr. Sharlet Salina, [facility]  . Helicobacter pylori gastritis   . Heme positive stool   . History of gout   . History of peptic ulcer disease   . History of PSVT (paroxysmal supraventricular tachycardia)   .  Hyperlipidemia   . Hypertension   . Obstructive sleep apnea    on CPAP  . Perennial allergic rhinitis   . Tobacco abuse     Past Surgical History:  Procedure Laterality Date  . ASD REPAIR, OSTIUM PRIMUM  1966  . 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 open heart surgery  1966   Dr. Annamaria Boots ([facility]); was PFO repair--has been normal in f/u  . PALATE / UVULA BIOPSY / EXCISION  2000   Dr.Sprenhe [facility])  . TONSILLECTOMY AND ADENOIDECTOMY  1959    Family History  Problem Relation Age of Onset  . Failure to thrive Mother   . Arthritis Mother     Social History    Social History  . Marital status: Married    Spouse name: N/A  . Number of children: N/A  . Years of education: N/A   Social History Main Topics  . Smoking status: Current Some Day Smoker    Years: 40.00  . Smokeless tobacco: Former Systems developer  . Alcohol use No     Comment: previously did drink alcohol heavily  . Drug use: No     Comment: likes to avoid pain meds, no illicit drugs used  . Sexual activity: Not Asked   Other Topics Concern  . None   Social History Narrative   Drinks caffeine   Married in [place] in house   Two story home, 2 live in home   [place] and does yardwork   Has living will and [place]       reports that he has been smoking.  He has smoked for the past 40.00 years. He has quit using smokeless tobacco. He reports that he does not drink alcohol or use drugs.  No Known Allergies    Medication List       Accurate as of 08/07/16  9:35 AM. Always use your most recent med list.          buPROPion 300 MG 24 hr tablet Commonly known as:  WELLBUTRIN XL Take 1 tablet (300 mg total) by mouth daily.   colchicine 0.6 MG tablet 1 by mouth daily as needed for acute gout attack   hydrochlorothiazide 25 MG tablet Commonly known as:  HYDRODIURIL TAKE 1 TABLET EVERY DAY   meloxicam 15 MG tablet Commonly known as:  MOBIC   senna 8.6 MG tablet Commonly known as:  SENOKOT Take 2 tablets by mouth at bedtime.   simvastatin 40 MG tablet Commonly known as:  ZOCOR TAKE 1 TABLET EVERY DAY   traZODone 50 MG tablet Commonly known as:  DESYREL TAKE 1 TABLET AT BEDTIME AS NEEDED  FOR  INSOMNIA   verapamil 240 MG CR tablet Commonly known as:  CALAN-SR TAKE 2 TABLETS EVERY DAY      Review of Systems:  Review of Systems  Constitutional: Negative for chills, fever and malaise/fatigue.  HENT: Positive for hearing loss. Negative for congestion.   Eyes: Negative for blurred vision.       Glasses  Respiratory: Negative for cough and  shortness of breath.   Cardiovascular: Negative for chest pain, palpitations and leg swelling.  Gastrointestinal: Negative for abdominal pain, blood in stool, constipation, heartburn and melena.  Genitourinary: Negative for dysuria, frequency and urgency.  Musculoskeletal: Negative for falls, joint pain and myalgias.  Skin: Negative for itching and rash.  Neurological: Negative for dizziness, loss of consciousness and weakness.  Endo/Heme/Allergies: Does not bruise/bleed  easily.  Psychiatric/Behavioral: Negative for depression and memory loss. The patient does not have insomnia.     Physical Exam: Vitals:   08/07/16 0855  BP: 140/70  Pulse: 69  Temp: 97.6 F (36.4 C)  TempSrc: Oral  SpO2: 96%  Weight: 211 lb (95.7 kg)  Height: 6' (1.829 m)   Body mass index is 28.62 kg/m. Physical Exam  Constitutional: He is oriented to person, place, and time. He appears well-developed and well-nourished. No distress.  HENT:  Head: Normocephalic and atraumatic.  Right Ear: External ear normal.  Left Ear: External ear normal.  Nose: Nose normal.  Mouth/Throat: Oropharynx is clear and moist. No oropharyngeal exudate.  Eyes: Conjunctivae and EOM are normal. Pupils are equal, round, and reactive to light.  glasses  Neck: Normal range of motion. Neck supple. No JVD present.  Cardiovascular: Normal rate, regular rhythm, normal heart sounds and intact distal pulses.   No murmur heard. Pulmonary/Chest: Effort normal and breath sounds normal. No respiratory distress.  Abdominal: Soft. Bowel sounds are normal. He exhibits no distension. There is no tenderness.  Musculoskeletal: Normal range of motion. He exhibits no edema or tenderness.  Lymphadenopathy:    He has no cervical adenopathy.  Neurological: He is alert and oriented to person, place, and time. He has normal reflexes.  Skin: Skin is warm and dry. Capillary refill takes less than 2 seconds.  Psychiatric: He has a normal mood and affect.  His behavior is normal. Judgment and thought content normal.    Labs reviewed: Basic Metabolic Panel:  Recent Labs  01/09/16 0825  NA 139  K 4.4  CL 99  CO2 24  GLUCOSE 102*  BUN 15  CREATININE 1.02  CALCIUM 9.8   Liver Function Tests:  Recent Labs  01/09/16 0825  AST 13  ALT 12  ALKPHOS 52  BILITOT 0.6  PROT 6.5  ALBUMIN 4.5   No results for input(s): LIPASE, AMYLASE in the last 8760 hours. No results for input(s): AMMONIA in the last 8760 hours. CBC:  Recent Labs  01/09/16 0825  WBC 6.3  NEUTROABS 3.6  HCT 43.1  MCV 90  PLT 245   Lipid Panel:  Recent Labs  01/09/16 0825  CHOL 128  HDL 43  LDLCALC 58  TRIG 133  CHOLHDL 3.0   Lab Results  Component Value Date   HGBA1C 6.0 (H) 01/09/2016    Assessment/Plan 1. Medicare annual wellness visit, initial -performed today, see hpi and flowsheets -pneumovax given today and pt completed record release again for his records from [facility] to be sent here   2. Essential hypertension, benign - bp at goal so cont hctz, verapamil  - EKG 12-Lead - DNR (Do Not Resuscitate) - COMPLETE METABOLIC PANEL WITH GFR  3. Hyperglycemia - f/u labs to see where this stands -I extensively encouraged him to begin a walking program for weight loss and improvements in glucose and lipids - DNR (Do Not Resuscitate) - COMPLETE METABOLIC PANEL WITH GFR - Hemoglobin A1c - Lipid panel  4. Mixed hyperlipidemia - f/u labs, cont simvastatin  - DNR (Do Not Resuscitate) - Lipid panel  5. Tobacco abuse - reviewed with him today--he is not ready to quit immediately but does hope to quit by his next visit - DNR (Do Not Resuscitate) - CBC with Differential/Platelet - COMPLETE METABOLIC PANEL WITH GFR  6. Body mass index 28.0-28.9, adult - see hyperglycemia - DNR (Do Not Resuscitate) - CBC with Differential/Platelet - COMPLETE METABOLIC PANEL WITH GFR -  Hemoglobin A1c - Lipid panel  7. Need for prophylactic  vaccination against Streptococcus pneumoniae (pneumococcus) - Pneumococcal polysaccharide vaccine 23-valent greater than or equal to 2yo subcutaneous/IM  Labs/tests ordered:   Orders Placed This Encounter  Procedures  . Pneumococcal polysaccharide vaccine 23-valent greater than or equal to 2yo subcutaneous/IM  . CBC with Differential/Platelet  . COMPLETE METABOLIC PANEL WITH GFR    [facility]  . Hemoglobin A1c  . Lipid panel    Order Specific Question:   Has the patient fasted?    Answer:   Yes  . DNR (Do Not Resuscitate)    Reviewed with pt 08/07/16    Order Specific Question:   In the event of cardiac or respiratory ARREST    Answer:   Do not call a "code blue"    Order Specific Question:   In the event of cardiac or respiratory ARREST    Answer:   Do not perform Intubation, CPR, defibrillation or ACLS    Order Specific Question:   In the event of cardiac or respiratory ARREST    Answer:   Use medication by any route, position, wound care, and other measures to relive pain and suffering. May use oxygen, suction and manual treatment of airway obstruction as needed for comfort.  . EKG 12-Lead   Next appt:  6 mos for med mgt  Jayleana Colberg L. Tashari Schoenfelder, D.O. [facility] [address], [place] [address] Cell Phone (Mon-Fri 8am-5pm):  [phone] On Call:  [phone] & follow prompts after 5pm & weekends Office Phone:  [phone] Office Fax:  [phone]

## 2016-08-07 NOTE — Patient Instructions (Addendum)
Please begin walking with a goal of 30 minutes per day 5 days per week.  Also, continue to cut back on cigarettes and try to quit by the time I see you again in 6 months.  Fat and Cholesterol Restricted Diet Getting too much fat and cholesterol in your diet may cause health problems. Following this diet helps keep your fat and cholesterol at normal levels. This can keep you from getting sick. WHAT TYPES OF FAT SHOULD I CHOOSE?  Choose monosaturated and polyunsaturated fats. These are found in foods such as olive oil, canola oil, flaxseeds, walnuts, almonds, and seeds.  Eat more omega-3 fats. Good choices include salmon, mackerel, sardines, tuna, flaxseed oil, and ground flaxseeds.  Limit saturated fats. These are in animal products such as meats, butter, and cream. They can also be in plant products such as palm oil, palm kernel oil, and coconut oil.   Avoid foods with partially hydrogenated oils in them. These contain trans fats. Examples of foods that have trans fats are stick margarine, some tub margarines, cookies, crackers, and other baked goods. WHAT GENERAL GUIDELINES DO I NEED TO FOLLOW?   Check food labels. Look for the words "trans fat" and "saturated fat."  When preparing a meal:  Fill half of your plate with vegetables and green salads.  Fill one fourth of your plate with whole grains. Look for the word "whole" as the first word in the ingredient list.  Fill one fourth of your plate with lean protein foods.  Limit fruit to two servings a day. Choose fruit instead of juice.  Eat more foods with soluble fiber. Examples of foods with this type of fiber are apples, broccoli, carrots, beans, peas, and barley. Try to get 20-30 g (grams) of fiber per day.  Eat more home-cooked foods. Eat less at restaurants and buffets.  Limit or avoid alcohol.  Limit foods high in starch and sugar.  Limit fried foods.  Cook foods without frying them. Baking, boiling, grilling, and broiling  are all great options.  Lose weight if you are overweight. Losing even a small amount of weight can help your overall health. It can also help prevent diseases such as diabetes and heart disease. WHAT FOODS CAN I EAT? Grains Whole grains, such as whole wheat or whole grain breads, crackers, cereals, and pasta. Unsweetened oatmeal, bulgur, barley, quinoa, or brown rice. Corn or whole wheat flour tortillas. Vegetables Fresh or frozen vegetables (raw, steamed, roasted, or grilled). Green salads. Fruits All fresh, canned (in natural juice), or frozen fruits. Meat and Other Protein Products Ground beef (85% or leaner), grass-fed beef, or beef trimmed of fat. Skinless chicken or [place]. Ground chicken or [place]. Pork trimmed of fat. All fish and seafood. Eggs. Dried beans, peas, or lentils. Unsalted nuts or seeds. Unsalted canned or dry beans. Dairy Low-fat dairy products, such as skim or 1% milk, 2% or reduced-fat cheeses, low-fat ricotta or cottage cheese, or plain low-fat yogurt. Fats and Oils Tub margarines without trans fats. Light or reduced-fat mayonnaise and salad dressings. Avocado. Olive, canola, sesame, or safflower oils. Natural peanut or almond butter (choose ones without added sugar and oil). The items listed above may not be a complete list of recommended foods or beverages. Contact your dietitian for more options. WHAT FOODS ARE NOT RECOMMENDED? Grains White bread. White pasta. White rice. Cornbread. Bagels, pastries, and croissants. Crackers that contain trans fat. Vegetables White potatoes. Corn. Creamed or fried vegetables. Vegetables in a cheese sauce. Fruits Dried fruits. Canned  fruit in light or heavy syrup. Fruit juice. Meat and Other Protein Products Fatty cuts of meat. Ribs, chicken wings, bacon, sausage, bologna, salami, chitterlings, fatback, hot dogs, bratwurst, and packaged luncheon meats. Liver and organ meats. Dairy Whole or 2% milk, cream, half-and-half, and cream  cheese. Whole milk cheeses. Whole-fat or sweetened yogurt. Full-fat cheeses. Nondairy creamers and whipped toppings. Processed cheese, cheese spreads, or cheese curds. Sweets and Desserts Corn syrup, sugars, honey, and molasses. Candy. Jam and jelly. Syrup. Sweetened cereals. Cookies, pies, cakes, donuts, muffins, and ice cream. Fats and Oils Butter, stick margarine, lard, shortening, ghee, or bacon fat. Coconut, palm kernel, or palm oils. Beverages Alcohol. Sweetened drinks (such as sodas, lemonade, and fruit drinks or punches). The items listed above may not be a complete list of foods and beverages to avoid. Contact your dietitian for more information.   This information is not intended to replace advice given to you by your health care provider. Make sure you discuss any questions you have with your health care provider.   Document Released: 03/22/2012 Document Revised: 10/12/2014 Document Reviewed: 12/21/2013 Elsevier Interactive Patient Education [employer].

## 2016-08-10 ENCOUNTER — Encounter: Payer: Self-pay | Admitting: *Deleted

## 2016-08-31 ENCOUNTER — Other Ambulatory Visit: Payer: Self-pay | Admitting: *Deleted

## 2016-08-31 MED ORDER — SIMVASTATIN 40 MG PO TABS: ORAL_TABLET | ORAL | 3 refills | Status: DC

## 2016-08-31 NOTE — Telephone Encounter (Signed)
Patient requested Rx to be faxed to [facility] 

## 2016-10-05 DIAGNOSIS — I219 Acute myocardial infarction, unspecified: Secondary | ICD-10-CM

## 2016-10-05 HISTORY — DX: Acute myocardial infarction, unspecified: I21.9

## 2016-10-07 ENCOUNTER — Telehealth: Payer: Self-pay | Admitting: *Deleted

## 2016-10-07 MED ORDER — HYDROCHLOROTHIAZIDE 25 MG PO TABS: ORAL_TABLET | ORAL | 1 refills | Status: DC

## 2016-10-07 NOTE — Telephone Encounter (Signed)
Patient stated that he needs a Rx for Pre-Medication for dental surgery -Amoxicillin 250mg  #4 take one hour prior to dental procedure. Also needed a refill on his HCTZ (faxed) Needs faxed to [facility]. Please Advise.

## 2016-10-08 ENCOUNTER — Encounter: Payer: Self-pay | Admitting: Internal Medicine

## 2016-10-08 DIAGNOSIS — Z8774 Personal history of (corrected) congenital malformations of heart and circulatory system: Secondary | ICD-10-CM | POA: Insufficient documentation

## 2016-10-08 HISTORY — DX: Personal history of (corrected) congenital malformations of heart and circulatory system: Z87.74

## 2016-10-08 MED ORDER — AMOXICILLIN 250 MG PO CAPS: ORAL_CAPSULE | ORAL | 0 refills | Status: DC

## 2016-10-08 NOTE — Telephone Encounter (Signed)
These Rxs are fine to fill.  He has a history of an atrial septal defect that was repaired many years ago so he does actually still require the antibiotic prophylaxis.

## 2016-10-08 NOTE — Telephone Encounter (Signed)
HCTZ was sent yesterday, amoxicillin sent today

## 2016-11-25 ENCOUNTER — Other Ambulatory Visit: Payer: Self-pay

## 2016-11-25 DIAGNOSIS — M5414 Radiculopathy, thoracic region: Secondary | ICD-10-CM | POA: Diagnosis not present

## 2016-11-25 DIAGNOSIS — M9902 Segmental and somatic dysfunction of thoracic region: Secondary | ICD-10-CM | POA: Diagnosis not present

## 2016-11-25 MED ORDER — SIMVASTATIN 40 MG PO TABS: ORAL_TABLET | ORAL | 2 refills | Status: DC

## 2016-11-25 MED ORDER — MELOXICAM 15 MG PO TABS
15.0000 mg | ORAL_TABLET | ORAL | 0 refills | Status: DC | PRN
Start: 2016-11-25 — End: 2017-02-22

## 2016-11-25 MED ORDER — VERAPAMIL HCL ER 240 MG PO TBCR: 480.0000 mg | EXTENDED_RELEASE_TABLET | Freq: Every day | ORAL | 2 refills | Status: DC

## 2016-11-25 MED ORDER — BUPROPION HCL ER (XL) 300 MG PO TB24: 300.0000 mg | ORAL_TABLET | Freq: Every day | ORAL | 2 refills | Status: DC

## 2016-11-25 MED ORDER — HYDROCHLOROTHIAZIDE 25 MG PO TABS: ORAL_TABLET | ORAL | 2 refills | Status: DC

## 2016-11-25 MED ORDER — TRAZODONE HCL 50 MG PO TABS: ORAL_TABLET | ORAL | 2 refills | Status: DC

## 2016-11-25 NOTE — Telephone Encounter (Signed)
Patient called requesting refill for all medications to [facility] EXCEPT Wellbutrin to be sent to [facility]. Patient requested 90 day supply

## 2016-11-27 DIAGNOSIS — M9902 Segmental and somatic dysfunction of thoracic region: Secondary | ICD-10-CM | POA: Diagnosis not present

## 2016-11-27 DIAGNOSIS — M5414 Radiculopathy, thoracic region: Secondary | ICD-10-CM | POA: Diagnosis not present

## 2016-12-01 DIAGNOSIS — M5414 Radiculopathy, thoracic region: Secondary | ICD-10-CM | POA: Diagnosis not present

## 2016-12-01 DIAGNOSIS — M9902 Segmental and somatic dysfunction of thoracic region: Secondary | ICD-10-CM | POA: Diagnosis not present

## 2017-02-11 ENCOUNTER — Ambulatory Visit (INDEPENDENT_AMBULATORY_CARE_PROVIDER_SITE_OTHER): Payer: Medicare HMO | Admitting: Internal Medicine

## 2017-02-11 ENCOUNTER — Encounter: Payer: Self-pay | Admitting: Internal Medicine

## 2017-02-11 VITALS — BP 110/80 | HR 71 | Temp 97.5°F | Wt 226.0 lb

## 2017-02-11 DIAGNOSIS — Z8774 Personal history of (corrected) congenital malformations of heart and circulatory system: Secondary | ICD-10-CM | POA: Diagnosis not present

## 2017-02-11 DIAGNOSIS — I10 Essential (primary) hypertension: Secondary | ICD-10-CM

## 2017-02-11 DIAGNOSIS — Z9989 Dependence on other enabling machines and devices: Secondary | ICD-10-CM | POA: Diagnosis not present

## 2017-02-11 DIAGNOSIS — Z716 Tobacco abuse counseling: Secondary | ICD-10-CM | POA: Diagnosis not present

## 2017-02-11 DIAGNOSIS — G4733 Obstructive sleep apnea (adult) (pediatric): Secondary | ICD-10-CM

## 2017-02-11 DIAGNOSIS — Z720 Tobacco use: Secondary | ICD-10-CM

## 2017-02-11 DIAGNOSIS — R635 Abnormal weight gain: Secondary | ICD-10-CM | POA: Diagnosis not present

## 2017-02-11 DIAGNOSIS — K5909 Other constipation: Secondary | ICD-10-CM

## 2017-02-11 NOTE — Patient Instructions (Signed)
Continue off cigarettes!  Glad you are recently breathing better!

## 2017-02-11 NOTE — Progress Notes (Signed)
Location:  [facility] clinic Provider:  Inell Mimbs L. Mariea Clonts, D.O., C.M.D.  Code Status: DNR Goals of Care:  Advanced Directives 02/11/2017  Does Patient Have a Medical Advance Directive? Yes  Type of Paramedic of [place];Living will  Does patient want to make changes to medical advance directive? -  Copy of [place] in Chart? Yes  Would patient like information on creating a medical advance directive? -   Chief Complaint  Patient presents with  . Medical Management of Chronic Issues    79mth follow-up    HPI: Patient is a 71 y.o. male seen today for medical management of chronic diseases.    He's worried about his wife's palsy of her nerve after neck surgery.  Otherwise spirits are good.    His weight is up, but he has not smoked since November of last year.  Had trended down and then got a cold and quit.  Up 15 lbs in 6 months.  Has been eating what he wants lately.  He is active in terms of doing all of his own yard work, using a Chiropractor not riding mower, but only walking about 2x per week.  Always takes stairs not elevator/escalators.  Says he's constipated off and on.  Has begun a regime of miralax in his morning coffee for 3-4 days.  He did warm prune juice and put miralax in it.  Had a period of 3-4 days w/o a BM--then very hard.  Discussed hydration, physical activity.  Has not been walking like he should be.  Senokot had stopped working.    Knees hurt a little, not badly.  Bother him going up steps.  No falls.  Says not as limber.  Skin is thin and bruises more easily.  Using his CPAP for OSA--did not fill Rx for nasal cpap pillows and tubing due to wear and tear.    He has a h/o ASD repair many years ago at [facility].  There were initially some concerns about his conduction system.  He's had zero problems since in this regard.    Has not smoked since Nov last year.  Says he can't guarantee he'll never smoke again.  We reviewed his current  improved respiratory status (he's less winded when mowing).  He is aware of other benefits including reduced risk of cancer and heart disease and lung disease.    He is due 2020 for f/u colon cancer screening.  Past Medical History:  Diagnosis Date  . Depression with anxiety   . Gastric ulcer, acute with hemorrhage 2001   Dr. Sharlet Salina, [facility]  . Helicobacter pylori gastritis   . Heme positive stool   . History of gout   . History of peptic ulcer disease   . History of PSVT (paroxysmal supraventricular tachycardia)   . Hyperlipidemia   . Hypertension   . Obstructive sleep apnea    on CPAP  . Perennial allergic rhinitis   . Tobacco abuse     Past Surgical History:  Procedure Laterality Date  . ASD REPAIR, OSTIUM PRIMUM  1966  . 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 open heart surgery  1966   Dr. Annamaria Boots ([facility]); was PFO repair--has been normal in f/u  . PALATE / UVULA BIOPSY / EXCISION  2000   Dr.Sprenhe [facility])  . TONSILLECTOMY AND ADENOIDECTOMY  1959    Allergies  Allergen Reactions  . Pollen Extract     Allergies as of 02/11/2017  Reactions   Pollen Extract       Medication List       Accurate as of 02/11/17 10:49 AM. Always use your most recent med list.          amoxicillin 250 MG capsule Commonly known as:  AMOXIL Take 4 tablets 1 hour prior to dental procedure   buPROPion 300 MG 24 hr tablet Commonly known as:  WELLBUTRIN XL Take 1 tablet (300 mg total) by mouth daily.   colchicine 0.6 MG tablet 1 by mouth daily as needed for acute gout attack   hydrochlorothiazide 25 MG tablet Commonly known as:  HYDRODIURIL Take one tablet by mouth once daily   meloxicam 15 MG tablet Commonly known as:  MOBIC Take 1 tablet (15 mg total) by mouth as needed for pain.   senna 8.6 MG tablet Commonly known as:  SENOKOT Take 2 tablets by mouth at bedtime.   simvastatin 40 MG tablet Commonly known as:  ZOCOR Take one tablet by mouth once  daily   traZODone 50 MG tablet Commonly known as:  DESYREL TAKE 1 TABLET AT BEDTIME AS NEEDED  FOR  INSOMNIA   verapamil 240 MG CR tablet Commonly known as:  CALAN-SR Take 2 tablets (480 mg total) by mouth daily.       Review of Systems:  Review of Systems  Constitutional: Negative for chills, diaphoresis, fever, malaise/fatigue and weight loss.  HENT: Negative for congestion and hearing loss.   Respiratory: Negative for cough and shortness of breath.        Not smoking since late last year  Cardiovascular: Negative for chest pain, palpitations and leg swelling.  Gastrointestinal: Positive for constipation. Negative for abdominal pain, blood in stool, diarrhea, heartburn, melena, nausea and vomiting.  Genitourinary: Negative for dysuria.  Musculoskeletal: Positive for joint pain. Negative for falls.  Skin: Negative for itching and rash.  Neurological: Negative for dizziness, loss of consciousness and weakness.  Endo/Heme/Allergies: Bruises/bleeds easily.  Psychiatric/Behavioral: Negative for depression and memory loss. The patient is not nervous/anxious and does not have insomnia.     Health Maintenance  Topic Date Due  . Hepatitis C Screening  [birth year]  . TETANUS/TDAP  07/31/1964  . INFLUENZA VACCINE  05/05/2017  . COLONOSCOPY  03/22/2019  . PNA vac Low Risk Adult  Completed    Physical Exam: Vitals:   02/11/17 1030  BP: 110/80  Pulse: 71  Temp: 97.5 F (36.4 C)  TempSrc: Oral  SpO2: 97%  Weight: 226 lb (102.5 kg)   Body mass index is 30.65 kg/m. Physical Exam  Constitutional: He is oriented to person, place, and time. He appears well-developed and well-nourished. No distress.  HENT:  Head: Normocephalic and atraumatic.  Eyes:  glasses  Cardiovascular: Normal rate, regular rhythm and intact distal pulses.   Murmur heard. Pulmonary/Chest: Effort normal and breath sounds normal. No respiratory distress.  Abdominal: Soft. Bowel sounds are normal. He  exhibits no distension and no mass. There is no tenderness. There is no rebound and no guarding. No hernia.  Musculoskeletal: Normal range of motion.  Neurological: He is alert and oriented to person, place, and time.  Skin: Skin is warm and dry. Capillary refill takes less than 2 seconds.  Psychiatric: He has a normal mood and affect. His behavior is normal. Judgment and thought content normal.    Labs reviewed: Basic Metabolic Panel:  Recent Labs  08/07/16 1005  NA 138  K 3.8  CL 102  CO2 24  GLUCOSE 95  BUN 22  CREATININE 1.04  CALCIUM 9.5   Liver Function Tests:  Recent Labs  08/07/16 1005  AST 16  ALT 13  ALKPHOS 40  BILITOT 0.7  PROT 6.8  ALBUMIN 4.4   No results for input(s): LIPASE, AMYLASE in the last 8760 hours. No results for input(s): AMMONIA in the last 8760 hours. CBC:  Recent Labs  08/07/16 1005  WBC 7.4  NEUTROABS 4,514  HGB 14.6  HCT 42.9  MCV 92.3  PLT 268   Lipid Panel:  Recent Labs  08/07/16 1005  CHOL 125  HDL 45  LDLCALC 60  TRIG 100  CHOLHDL 2.8   Lab Results  Component Value Date   HGBA1C 5.5 08/07/2016    Assessment/Plan 1. Encounter for smoking cessation counseling -continues off cigarettes, spent more than 3 minutes today discussing this  2. Essential hypertension, benign -bp well controlled on hctz, verapamil - COMPLETE METABOLIC PANEL WITH GFR; Future  3. Chronic constipation -cont new miralax and prune juice regimen, but encouraged better hydration with water and increased cardiovascular/aerobic activity  4. Obstructive sleep apnea on CPAP -cont CPAP therapy--new Rx for replacement nasal pillows and tubing was provided  5. Weight gain - up 15 lbs in 6 months due to "eating what he wanted" - CBC with Differential/Platelet; Future - COMPLETE METABOLIC PANEL WITH GFR; Future - Lipid panel; Future - Hemoglobin A1c; Future  6. History of atrial septal defect repair - no further problems since besides slight  murmur on exam  Labs/tests ordered:   Orders Placed This Encounter  Procedures  . CBC with Differential/Platelet    Standing Status:   Future    Standing Expiration Date:   02/11/2018  . COMPLETE METABOLIC PANEL WITH GFR    Standing Status:   Future    Standing Expiration Date:   02/11/2018  . Lipid panel    Standing Status:   Future    Standing Expiration Date:   02/11/2018  . Hemoglobin A1c    Standing Status:   Future    Standing Expiration Date:   02/11/2018   Next appt:  6 mos CPE, AWV    Natha Guin L. Rueben Kassim, D.O. [facility] [address], [place] [address] Cell Phone (Mon-Fri 8am-5pm):  [phone] On Call:  [phone] & follow prompts after 5pm & weekends Office Phone:  [phone] Office Fax:  [phone]

## 2017-02-22 ENCOUNTER — Ambulatory Visit (INDEPENDENT_AMBULATORY_CARE_PROVIDER_SITE_OTHER): Payer: Medicare HMO | Admitting: Internal Medicine

## 2017-02-22 ENCOUNTER — Encounter: Payer: Self-pay | Admitting: Internal Medicine

## 2017-02-22 VITALS — BP 130/80 | HR 84 | Temp 97.9°F | Wt 226.0 lb

## 2017-02-22 DIAGNOSIS — J301 Allergic rhinitis due to pollen: Secondary | ICD-10-CM | POA: Diagnosis not present

## 2017-02-22 DIAGNOSIS — J0100 Acute maxillary sinusitis, unspecified: Secondary | ICD-10-CM | POA: Diagnosis not present

## 2017-02-22 DIAGNOSIS — J209 Acute bronchitis, unspecified: Secondary | ICD-10-CM

## 2017-02-22 MED ORDER — BUPROPION HCL ER (XL) 150 MG PO TB24: 150.0000 mg | ORAL_TABLET | Freq: Every day | ORAL | 3 refills | Status: DC

## 2017-02-22 MED ORDER — HYDROCOD POLST-CPM POLST ER 10-8 MG/5ML PO SUER: 5.0000 mL | Freq: Every evening | ORAL | 0 refills | Status: DC | PRN

## 2017-02-22 MED ORDER — AZITHROMYCIN 250 MG PO TABS: ORAL_TABLET | ORAL | 0 refills | Status: DC

## 2017-02-22 NOTE — Patient Instructions (Signed)
Push fluids--drink plenty of water.

## 2017-02-22 NOTE — Progress Notes (Signed)
Location:  [facility] clinic Provider: Lucine Bilski L. Mariea Clonts, D.O., C.M.D.  Code Status: DNR Goals of Care:  Advanced Directives 02/11/2017  Does Patient Have a Medical Advance Directive? Yes  Type of Paramedic of [place];Living will  Does patient want to make changes to medical advance directive? -  Copy of [place] in Chart? Yes  Would patient like information on creating a medical advance directive? -     Chief Complaint  Patient presents with  . Acute Visit    head, chest congestion, cough and sinus pressure x2weeks    HPI: Patient is a 71 y.o. male seen today for an acute visit for head and chest congestion, cough and sinus pressure x 2 weeks.  Some sore throat and drainage.  Felt like he couldn't get any oxygen.  Much better today.  Head still stopped up--he's pretty sure it's a sinus infection.  Has some pollen allergy, but this time of year, he gets a little asthma.  He's also been working outside.  It started with a tickle in the chest and progressed to a full fledged cold--did airborne, zinc, mucinex.    Wants to taper down his wellbutrin to off if he can tolerate.  It helped him to quit smoking.  He hasn't been depressed in 3 years.  Past Medical History:  Diagnosis Date  . Depression with anxiety   . Gastric ulcer, acute with hemorrhage 2001   Dr. Sharlet Salina, [facility]  . Helicobacter pylori gastritis   . Heme positive stool   . History of gout   . History of peptic ulcer disease   . History of PSVT (paroxysmal supraventricular tachycardia)   . Hyperlipidemia   . Hypertension   . Obstructive sleep apnea    on CPAP  . Perennial allergic rhinitis   . Tobacco abuse     Past Surgical History:  Procedure Laterality Date  . ASD REPAIR, OSTIUM PRIMUM  1966  . 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 open heart surgery  1966   Dr. Annamaria Boots ([facility]); was PFO repair--has been normal in f/u  . PALATE / UVULA BIOPSY / EXCISION   2000   Dr.Sprenhe [facility])  . TONSILLECTOMY AND ADENOIDECTOMY  1959    Allergies  Allergen Reactions  . Pollen Extract     Allergies as of 02/22/2017      Reactions   Pollen Extract       Medication List       Accurate as of 02/22/17  3:22 PM. Always use your most recent med list.          amoxicillin 250 MG capsule Commonly known as:  AMOXIL Take 4 tablets 1 hour prior to dental procedure   buPROPion 300 MG 24 hr tablet Commonly known as:  WELLBUTRIN XL Take 1 tablet (300 mg total) by mouth daily.   colchicine 0.6 MG tablet 1 by mouth daily as needed for acute gout attack   hydrochlorothiazide 25 MG tablet Commonly known as:  HYDRODIURIL Take one tablet by mouth once daily   meloxicam 15 MG tablet Commonly known as:  MOBIC Take 1 tablet (15 mg total) by mouth as needed for pain.   senna 8.6 MG tablet Commonly known as:  SENOKOT Take 2 tablets by mouth at bedtime.   simvastatin 40 MG tablet Commonly known as:  ZOCOR Take one tablet by mouth once daily   traZODone 50 MG tablet Commonly known as:  DESYREL TAKE  1 TABLET AT BEDTIME AS NEEDED  FOR  INSOMNIA   verapamil 240 MG CR tablet Commonly known as:  CALAN-SR Take 2 tablets (480 mg total) by mouth daily.       Review of Systems:  Review of Systems  Constitutional: Negative for chills, fever and malaise/fatigue.  HENT: Positive for congestion, ear pain, sinus pain and sore throat.        Right ear with dried secretions   Eyes: Negative for blurred vision.  Respiratory: Positive for cough, sputum production, shortness of breath and wheezing. Negative for stridor.   Cardiovascular: Negative for chest pain, palpitations and leg swelling.  Gastrointestinal: Negative for abdominal pain.  Genitourinary: Negative for dysuria.  Musculoskeletal: Negative for falls.  Skin: Negative for itching and rash.  Neurological: Negative for dizziness, loss of consciousness and weakness.  Endo/Heme/Allergies: Does not  bruise/bleed easily.  Psychiatric/Behavioral: Negative for depression and memory loss.    Health Maintenance  Topic Date Due  . Hepatitis C Screening  1946/05/17  . TETANUS/TDAP  07/31/1964  . INFLUENZA VACCINE  05/05/2017  . COLONOSCOPY  03/22/2019  . PNA vac Low Risk Adult  Completed    Physical Exam: Vitals:   02/22/17 1501  BP: 130/80  Pulse: 84  Temp: 97.9 F (36.6 C)  TempSrc: Oral  SpO2: 96%  Weight: 226 lb (102.5 kg)   Body mass index is 30.65 kg/m. Physical Exam  Constitutional: He is oriented to person, place, and time. He appears well-developed and well-nourished. No distress.  HENT:  Head: Normocephalic and atraumatic.  Right Ear: External ear normal.  Left Ear: External ear normal.  Nose: Nose normal.  Mouth/Throat: No oropharyngeal exudate.  Dry with postnasal drip; right ear with dried discharge  Eyes: Conjunctivae and EOM are normal. Pupils are equal, round, and reactive to light.  Neck: Neck supple. No JVD present. No thyromegaly present.  Cardiovascular: Normal rate, regular rhythm, normal heart sounds and intact distal pulses.   Pulmonary/Chest: Effort normal. No respiratory distress. He has no wheezes. He has no rales.  rhonchi  Musculoskeletal: Normal range of motion.  Lymphadenopathy:    He has cervical adenopathy.  Neurological: He is alert and oriented to person, place, and time.  Skin: Skin is warm and dry.  Psychiatric: He has a normal mood and affect.    Labs reviewed: Basic Metabolic Panel:  Recent Labs  08/07/16 1005  NA 138  K 3.8  CL 102  CO2 24  GLUCOSE 95  BUN 22  CREATININE 1.04  CALCIUM 9.5   Liver Function Tests:  Recent Labs  08/07/16 1005  AST 16  ALT 13  ALKPHOS 40  BILITOT 0.7  PROT 6.8  ALBUMIN 4.4   No results for input(s): LIPASE, AMYLASE in the last 8760 hours. No results for input(s): AMMONIA in the last 8760 hours. CBC:  Recent Labs  08/07/16 1005  WBC 7.4  NEUTROABS 4,514  HGB 14.6  HCT  42.9  MCV 92.3  PLT 268   Lipid Panel:  Recent Labs  08/07/16 1005  CHOL 125  HDL 45  LDLCALC 60  TRIG 100  CHOLHDL 2.8   Lab Results  Component Value Date   HGBA1C 5.5 08/07/2016   Assessment/Plan 1. Acute non-recurrent maxillary sinusitis - azithromycin (ZITHROMAX) 250 MG tablet; 2 tabs today and 1 tab daily for 4 days  Dispense: 6 tablet; Refill: 0 - push fluids -if no better in a couple weeks, call back--will consider singulair or zyrtec -also given one bottle  of tussionex to help with sleep from cough -add humidity to cpap at hs  2. Seasonal allergic rhinitis due to pollen - does not want allergy meds unless absolutely necessary  3. Acute bronchitis, unspecified organism - azithromycin (ZITHROMAX) 250 MG tablet; 2 tabs today and 1 tab daily for 4 days  Dispense: 6 tablet; Refill: 0  Labs/tests ordered:  Next appt:  08/05/2017  Kyliyah Stirn L. Monnie Anspach, D.O. [facility] [address], [place] [address] Cell Phone (Mon-Fri 8am-5pm):  [phone] On Call:  [phone] & follow prompts after 5pm & weekends Office Phone:  [phone] Office Fax:  [phone]

## 2017-02-26 ENCOUNTER — Other Ambulatory Visit: Payer: Self-pay | Admitting: Internal Medicine

## 2017-03-19 ENCOUNTER — Other Ambulatory Visit: Payer: Self-pay | Admitting: Internal Medicine

## 2017-03-26 ENCOUNTER — Other Ambulatory Visit: Payer: Self-pay | Admitting: *Deleted

## 2017-03-26 MED ORDER — MELOXICAM 15 MG PO TABS: 15.0000 mg | ORAL_TABLET | ORAL | 0 refills | Status: DC | PRN

## 2017-03-26 NOTE — Telephone Encounter (Signed)
[facility]

## 2017-04-05 ENCOUNTER — Other Ambulatory Visit: Payer: Self-pay | Admitting: Internal Medicine

## 2017-04-08 ENCOUNTER — Encounter: Payer: Self-pay | Admitting: Nurse Practitioner

## 2017-04-08 ENCOUNTER — Telehealth: Payer: Self-pay

## 2017-04-08 ENCOUNTER — Ambulatory Visit (INDEPENDENT_AMBULATORY_CARE_PROVIDER_SITE_OTHER): Payer: Medicare HMO | Admitting: Nurse Practitioner

## 2017-04-08 VITALS — BP 116/68 | HR 62 | Temp 98.1°F | Resp 17 | Ht 72.0 in | Wt 226.0 lb

## 2017-04-08 DIAGNOSIS — R0789 Other chest pain: Secondary | ICD-10-CM

## 2017-04-08 NOTE — Telephone Encounter (Signed)
Noted, will see pt today, will need to get EKG

## 2017-04-08 NOTE — Telephone Encounter (Signed)
Patient called c/o of chest pain   1. Where is the pain located? Center of chest, only happens when mowing grass (push mower)  2. Does the pain radiate? (left arm or jaw), No   3. Any associated symptoms like shortness of breath,sweating,indigestion, or anxiety? Slight SOB    4. If patient with history of myocardial infarction/coronary artery disease does the pain feel like your previous episode/heart attack? No   5. How long have you had the pain? Since Spring of 2018  Scheduled appointment for today with [name], NP   I will forward your responses to your provider and call you with instructions, if your symptoms persist or progress call 911 to seek immediate medical attention.

## 2017-04-08 NOTE — Progress Notes (Signed)
Careteam: Patient Care Team: Gayland Curry, DO as PCP - General (Geriatric Medicine) [name], [place] as Referring Physician (Dermatology) [name], MD as Referring Physician (Otolaryngology)  Advanced Directive information Does Patient Have a Medical Advance Directive?: Yes, Type of Advance Directive: [place];Living will  Allergies  Allergen Reactions  . Pollen Extract     Chief Complaint  Patient presents with  . Acute Visit    Pt is being seen due to having chest discomfort (center mass) when he mowes the yard since spring 2018. Discomfort does not occur any other time.      HPI: Patient is a 71 y.o. male seen in the office today due to chest discomfort.  Pt with hx of chronic sinusitis,hyperlipidemia, gout, htn. Pt states he has not been able to get his old records to our office despite multiple attempts from our office and himself. He has even went up to the practice and they would not give them to him.  Pt reports Hx of PFO at birth was corrected in the 6s. Also reports Hx of "abnormal tricuspid valve and enlarged heart".  He states he has Hx of tachycardia when he was drinking but quit ETOH 10 years ago and no tachycardia since. Stopped smoking in November of last year. Has had cardiologist in the past but he has since retired.   Pt has been having chest discomfort that he noticed starting in March of this year when he was mowing the grass. He notes he became short of breath with chest pain causing him to stop mowing and then it would go away.  Pushed on through many times but is always aware of it.  No dizziness, lightheadness, no diaphoresis  Does not have any radiating pain.  No routine exercise so mowing the grass is the most physical activity that he gets  Walks up and down the stairs at home. Does not get short of breath when he does this.  Taking ASA  81 mgEC daily  No chest congestion. Finally over his "crud"  No current  discomfort  Review of Systems:  Review of Systems  Constitutional: Negative for chills, diaphoresis, fever, malaise/fatigue and weight loss.  HENT: Negative for congestion and hearing loss.   Respiratory: Positive for shortness of breath (when mowing). Negative for cough.        Not smoking since late last year  Cardiovascular: Positive for chest pain (chest discomfort. ). Negative for palpitations and leg swelling.  Gastrointestinal: Negative for abdominal pain, blood in stool, constipation, diarrhea, heartburn, melena, nausea and vomiting.  Genitourinary: Negative for dysuria.  Musculoskeletal: Negative for falls and joint pain.  Skin: Negative for itching and rash.  Neurological: Negative for dizziness, loss of consciousness and weakness.  Endo/Heme/Allergies: Bruises/bleeds easily.  Psychiatric/Behavioral: Negative for depression and memory loss. The patient is not nervous/anxious and does not have insomnia.    Past Medical History:  Diagnosis Date  . Depression with anxiety   . Gastric ulcer, acute with hemorrhage 2001   Dr. Sharlet Salina, [facility]  . Helicobacter pylori gastritis   . Heme positive stool   . History of gout   . History of peptic ulcer disease   . History of PSVT (paroxysmal supraventricular tachycardia)   . Hyperlipidemia   . Hypertension   . Obstructive sleep apnea    on CPAP  . Perennial allergic rhinitis   . Tobacco abuse    Past Surgical History:  Procedure Laterality Date  . ASD REPAIR,  OSTIUM PRIMUM  1966  . 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 open heart surgery  1966   Dr. Annamaria Boots ([facility]); was PFO repair--has been normal in f/u  . PALATE / UVULA BIOPSY / EXCISION  2000   Dr.Sprenhe [facility])  . TONSILLECTOMY AND ADENOIDECTOMY  1959   Social History:   reports that he quit smoking about 19 months ago. He quit after 40.00 years of use. He has quit using smokeless tobacco. He reports that he does not drink alcohol or use drugs.  Family  History  Problem Relation Age of Onset  . Failure to thrive Mother   . Arthritis Mother     Medications: Patient's Medications  New Prescriptions   No medications on file  Previous Medications   BUPROPION (WELLBUTRIN XL) 300 MG 24 HR TABLET    Take 300 mg by mouth daily.   HYDROCHLOROTHIAZIDE (HYDRODIURIL) 25 MG TABLET    Take one tablet by mouth once daily   MELOXICAM (MOBIC) 15 MG TABLET    Take 1 tablet (15 mg total) by mouth as needed for pain.   SENNA (SENOKOT) 8.6 MG TABLET    Take 2 tablets by mouth at bedtime.   SIMVASTATIN (ZOCOR) 40 MG TABLET    Take one tablet by mouth once daily   TRAZODONE (DESYREL) 50 MG TABLET    TAKE 1 TABLET AT BEDTIME AS NEEDED  FOR  INSOMNIA   VERAPAMIL (CALAN-SR) 240 MG CR TABLET    Take 2 tablets (480 mg total) by mouth daily.  Modified Medications   No medications on file  Discontinued Medications   AMOXICILLIN (AMOXIL) 250 MG CAPSULE    Take 4 tablets 1 hour prior to dental procedure   AZITHROMYCIN (ZITHROMAX) 250 MG TABLET    2 tabs today and 1 tab daily for 4 days   BUPROPION (WELLBUTRIN XL) 150 MG 24 HR TABLET    Take 1 tablet (150 mg total) by mouth daily.   CHLORPHENIRAMINE-HYDROCODONE (TUSSIONEX PENNKINETIC ER) 10-8 MG/5ML SUER    Take 5 mLs by mouth at bedtime as needed for cough.   COLCHICINE 0.6 MG TABLET    1 by mouth daily as needed for acute gout attack     Physical Exam:  Vitals:   04/08/17 1521  BP: 116/68  Pulse: 62  Resp: 17  Temp: 98.1 F (36.7 C)  TempSrc: Oral  SpO2: 98%  Weight: 226 lb (102.5 kg)  Height: 6' (1.829 m)   Body mass index is 30.65 kg/m.  Physical Exam  Constitutional: He is oriented to person, place, and time. He appears well-developed and well-nourished. No distress.  HENT:  Head: Normocephalic and atraumatic.  Eyes:  glasses  Cardiovascular: Normal rate, regular rhythm and intact distal pulses.   Murmur heard.  Systolic murmur is present with a grade of 1/6  Pulmonary/Chest: Effort  normal and breath sounds normal. No respiratory distress.  Abdominal: Soft. Bowel sounds are normal. He exhibits no distension and no mass. There is no tenderness. There is no rebound and no guarding. No hernia.  Musculoskeletal: Normal range of motion.  Neurological: He is alert and oriented to person, place, and time.  Skin: Skin is warm and dry. Capillary refill takes less than 2 seconds.  Psychiatric: He has a normal mood and affect. His behavior is normal. Judgment and thought content normal.    Labs reviewed: Basic Metabolic Panel:  Recent Labs  08/07/16 1005  NA 138  K 3.8  CL 102  CO2 24  GLUCOSE 95  BUN 22  CREATININE 1.04  CALCIUM 9.5   Liver Function Tests:  Recent Labs  08/07/16 1005  AST 16  ALT 13  ALKPHOS 40  BILITOT 0.7  PROT 6.8  ALBUMIN 4.4   No results for input(s): LIPASE, AMYLASE in the last 8760 hours. No results for input(s): AMMONIA in the last 8760 hours. CBC:  Recent Labs  08/07/16 1005  WBC 7.4  NEUTROABS 4,514  HGB 14.6  HCT 42.9  MCV 92.3  PLT 268   Lipid Panel:  Recent Labs  08/07/16 1005  CHOL 125  HDL 45  LDLCALC 60  TRIG 100  CHOLHDL 2.8   TSH: No results for input(s): TSH in the last 8760 hours. A1C: Lab Results  Component Value Date   HGBA1C 5.5 08/07/2016     Assessment/Plan 1. Chest discomfort -chest pains since March of 2018, hx of PFO s/p repair, abnormal EKG, and murmur. Pt reports he was following with cardiology in the past but since his cardiologist retired and he did not find a new cardiologist to follow up.  -now with chest discomfort with activity.  - EKG 12-Lead without ischemia at this time but with poor R wave progression and first degree block. - Ambulatory referral to Cardiology due to chest discomfort with activity. We do not have pts records of previous EKG or Echos however states he has "enlarged heart" -to not push through pain, education given to seek medical attention if persist or  worsen. To limit strenuous activity at this time.   Carlos American. Harle Battiest  [facility] [phone] 8 am - 5 pm) [phone] (after hours)

## 2017-04-09 ENCOUNTER — Telehealth: Payer: Self-pay

## 2017-04-09 NOTE — Telephone Encounter (Signed)
Message left on clinical intake voicemail:   Patient called to check on status of Cardiology referral, Dr.Gollan [facility] in [place])   Referral order placed yesterday, not marked as urgent, confirmed with [name]. Patient aware. Patient states he lives in [place] and prefers Dr.Gollan   Dorothy (referral coordinator), I tried to add this information to the referral and got an alert that I do not have security to edit referral

## 2017-04-12 NOTE — Telephone Encounter (Signed)
Noted.  I see he was referred to cardiology in [place].

## 2017-04-22 DIAGNOSIS — M5414 Radiculopathy, thoracic region: Secondary | ICD-10-CM | POA: Diagnosis not present

## 2017-04-22 DIAGNOSIS — M9901 Segmental and somatic dysfunction of cervical region: Secondary | ICD-10-CM | POA: Diagnosis not present

## 2017-04-22 DIAGNOSIS — M5413 Radiculopathy, cervicothoracic region: Secondary | ICD-10-CM | POA: Diagnosis not present

## 2017-04-22 DIAGNOSIS — M9902 Segmental and somatic dysfunction of thoracic region: Secondary | ICD-10-CM | POA: Diagnosis not present

## 2017-04-26 DIAGNOSIS — M9902 Segmental and somatic dysfunction of thoracic region: Secondary | ICD-10-CM | POA: Diagnosis not present

## 2017-04-26 DIAGNOSIS — M5413 Radiculopathy, cervicothoracic region: Secondary | ICD-10-CM | POA: Diagnosis not present

## 2017-04-26 DIAGNOSIS — M5414 Radiculopathy, thoracic region: Secondary | ICD-10-CM | POA: Diagnosis not present

## 2017-04-26 DIAGNOSIS — M9901 Segmental and somatic dysfunction of cervical region: Secondary | ICD-10-CM | POA: Diagnosis not present

## 2017-06-03 NOTE — Progress Notes (Signed)
Cardiology Office Note  Date:  06/04/2017   ID:  Mitchell Everett., DOB 15-May-1946, MRN [id number]  PCP:  Gayland Curry, DO   Chief Complaint  Patient presents with  . OTHER    Chest discomfort. Meds reviewed verbally with pt.    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, Prior episodes of tachycardia associated with alcohol, none recently asd repair, age 30 Who presents by referral from Hollace Kinnier for evaluation of chest pain symptoms  He reports typically very active with no complaints Last year height in [place] with no symptoms  In March 2018 started developing exertional chest pain with shortness of breath Several times had to stop himself to catch his breath, weight for chest discomfort to resolve Always exertional related  Had episodes April him a May Chest pain episodes with breathing seemed to improve in the summer  He is concerned about underlying cardiac disease  No regular exercise program, does push mowing for activity  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  PSH:    Past Surgical History:  Procedure Laterality Date  . ASD REPAIR, OSTIUM PRIMUM  1966  . 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 open heart surgery  1966   Dr. Annamaria Boots ([facility]); was PFO  repair--has been normal in f/u  . PALATE / UVULA BIOPSY / EXCISION  2000   Dr.Sprenhe [facility])  . TONSILLECTOMY AND ADENOIDECTOMY  1959    Current Outpatient Prescriptions  Medication Sig Dispense Refill  . buPROPion (WELLBUTRIN XL) 300 MG 24 hr tablet Take 300 mg by mouth daily.    . hydrochlorothiazide (HYDRODIURIL) 25 MG tablet Take one tablet by mouth once daily 90 tablet 2  . meloxicam (MOBIC) 15 MG tablet Take 1 tablet (15 mg total) by mouth as needed for pain. 90 tablet 0  . senna (SENOKOT) 8.6 MG tablet Take 2 tablets by mouth at bedtime.    . simvastatin (ZOCOR) 40 MG tablet Take one tablet by mouth once daily 90 tablet 2  . traZODone (DESYREL) 50 MG tablet TAKE 1 TABLET AT BEDTIME AS NEEDED  FOR  INSOMNIA 90 tablet 2  . verapamil (CALAN-SR) 240 MG CR tablet Take 2 tablets (480 mg total) by mouth daily. 180 tablet 2   No current facility-administered medications for this visit.      Allergies:   Pollen extract   Social History:  The patient  reports that he quit smoking about 21 months ago. He quit after 40.00 years of use. He has quit using smokeless tobacco. He reports that he does not drink alcohol or use drugs.   Family History:   family history includes Arthritis in his mother; Failure to thrive in his mother.    Review of Systems: Review of Systems  Constitutional: Negative.   Respiratory: Negative.   Cardiovascular: Positive for chest pain.  Gastrointestinal: Negative.  Musculoskeletal: Negative.   Neurological: Negative.   Psychiatric/Behavioral: Negative.   All other systems reviewed and are negative.    PHYSICAL EXAM: VS:  BP 134/81 (BP Location: Right Arm, Patient Position: Sitting, Cuff Size: Large)   Pulse 79   Ht 6' (1.829 m)   Wt 221 lb 8 oz (100.5 kg)   BMI 30.04 kg/m  , BMI Body mass index is 30.04 kg/m. GEN: Well nourished, well developed, in no acute distress  HEENT: normal  Neck: no JVD, carotid bruits, or masses Cardiac: RRR; no murmurs,  rubs, or gallops,no edema  Respiratory:  clear to auscultation bilaterally, normal work of breathing GI: soft, nontender, nondistended, + BS MS: no deformity or atrophy  Skin: warm and dry, no rash Neuro:  Strength and sensation are intact Psych: euthymic mood, full affect    Recent Labs: 08/07/2016: ALT 13; BUN 22; Creat 1.04; Hemoglobin 14.6; Platelets 268; Potassium 3.8; Sodium 138    Lipid Panel Lab Results  Component Value Date   CHOL 125 08/07/2016   HDL 45 08/07/2016   LDLCALC 60 08/07/2016   TRIG 100 08/07/2016      Wt Readings from Last 3 Encounters:  06/04/17 221 lb 8 oz (100.5 kg)  04/08/17 226 lb (102.5 kg)  02/22/17 226 lb (102.5 kg)       ASSESSMENT AND PLAN:  Chest pain, unspecified type - Plan: EKG 12-Lead Symptoms concerning for stable angina Risk factors including long history of smoking, age, hyperlipidemia Moderate risk of coronary ischemia Discussed various options, after long discussion recommended treadmill Myoview This will be scheduled next week, we'll call him with the results Recommended he call us if his chest pain symptoms return, change in intensity, frequency  Tobacco abuse - Plan: EKG 12-Lead Reports that he stopped smoking November 2017   recommended continued smoking cessation  Hyperglycemia - Plan: EKG 12-Lead We have encouraged continued exercise, careful diet management in an effort to lose weight.  Essential hypertension, benign - Plan: EKG 12-Lead Blood pressure is well controlled on today's visit. No changes made to the medications.  Mixed hyperlipidemia - Plan: EKG 12-Lead Cholesterol is at goal on the current lipid regimen. No changes to the medications were made.  History of atrial septal defect repair - Plan: EKG 12-Lead  repair at age 47 . No indication of residual defect  Obstructive sleep apnea on CPAP - Plan: EKG 12-Lead   Disposition:   F/U  As needed    Total encounter time more than 45 minutes  Greater  than 50% was spent in counseling and coordination of care with the patient \   Orders Placed This Encounter  Procedures  . EKG 12-Lead     Signed, Esmond Plants, M.D., Ph.D. 06/04/2017  [place], [place]

## 2017-06-04 ENCOUNTER — Encounter: Payer: Self-pay | Admitting: Cardiovascular Disease

## 2017-06-04 ENCOUNTER — Ambulatory Visit (INDEPENDENT_AMBULATORY_CARE_PROVIDER_SITE_OTHER): Payer: Medicare HMO | Admitting: Cardiovascular Disease

## 2017-06-04 VITALS — BP 134/81 | HR 79 | Ht 72.0 in | Wt 221.5 lb

## 2017-06-04 DIAGNOSIS — I10 Essential (primary) hypertension: Secondary | ICD-10-CM

## 2017-06-04 DIAGNOSIS — E782 Mixed hyperlipidemia: Secondary | ICD-10-CM

## 2017-06-04 DIAGNOSIS — Z8774 Personal history of (corrected) congenital malformations of heart and circulatory system: Secondary | ICD-10-CM | POA: Diagnosis not present

## 2017-06-04 DIAGNOSIS — Z9989 Dependence on other enabling machines and devices: Secondary | ICD-10-CM

## 2017-06-04 DIAGNOSIS — R739 Hyperglycemia, unspecified: Secondary | ICD-10-CM | POA: Diagnosis not present

## 2017-06-04 DIAGNOSIS — R079 Chest pain, unspecified: Secondary | ICD-10-CM

## 2017-06-04 DIAGNOSIS — G4733 Obstructive sleep apnea (adult) (pediatric): Secondary | ICD-10-CM | POA: Diagnosis not present

## 2017-06-04 DIAGNOSIS — Z720 Tobacco use: Secondary | ICD-10-CM

## 2017-06-04 MED ORDER — VERAPAMIL HCL ER 180 MG PO TBCR: 180.0000 mg | EXTENDED_RELEASE_TABLET | Freq: Two times a day (BID) | ORAL | 3 refills | Status: DC

## 2017-06-04 NOTE — Patient Instructions (Addendum)
Medication Instructions:   Decrease verapamil to 180 mg twice daily  Labwork:  No new labs needed  Testing/Procedures:  We will order a treadmill myoview for chest pain Mitchell Stephens caregiver has ordered a Stress Test with nuclear imaging. The purpose of this test is to evaluate the blood supply to your heart muscle. This procedure is referred to as a "Non-Invasive Stress Test." This is because other than having an IV started in your vein, nothing is inserted or "invades" your body. Cardiac stress tests are done to find areas of poor blood flow to the heart by determining the extent of coronary artery disease (CAD). Some patients exercise on a treadmill, which naturally increases the blood flow to your heart, while others who are  unable to walk on a treadmill due to physical limitations have a pharmacologic/chemical stress agent called Lexiscan . This medicine will mimic walking on a treadmill by temporarily increasing your coronary blood flow.   Please note: these test may take anywhere between 2-4 hours to complete  PLEASE REPORT TO [facility] AT THE FIRST DESK WILL DIRECT YOU WHERE TO GO  Date of Procedure:_____Wednesday, September 19______  Arrival Time for Procedure:_____7:15 am_____________  Instructions regarding medication:    How to prepare for your Myoview test:  1. Do not eat or drink after midnight 2. No caffeine for 24 hours prior to test 3. No smoking 24 hours prior to test. 4. Your medication may be taken with water.   5.  Please wear a short sleeve shirt. 6. No perfume, cologne or lotion. 7. Wear comfortable walking shoes. No heels!   Follow-Up: It was a pleasure seeing you in the office today. Please call us if you have new issues that need to be addressed before your next appt.  [phone]  Your physician wants you to follow-up in: as needed  If you need a refill on your cardiac medications before your next  appointment, please call your pharmacy.    Cardiac Nuclear Scan A cardiac nuclear scan is a test that measures blood flow to the heart when a person is resting and when he or she is exercising. The test looks for problems such as:  Not enough blood reaching a portion of the heart.  The heart muscle not working normally.  You may need this test if:  You have heart disease.  You have had abnormal lab results.  You have had heart surgery or angioplasty.  You have chest pain.  You have shortness of breath.  In this test, a radioactive dye (tracer) is injected into your bloodstream. After the tracer has traveled to your heart, an imaging device is used to measure how much of the tracer is absorbed by or distributed to various areas of your heart. This procedure is usually done at a hospital and takes 2-4 hours. Tell a health care provider about:  Any allergies you have.  All medicines you are taking, including vitamins, herbs, eye drops, creams, and over-the-counter medicines.  Any problems you or family members have had with the use of anesthetic medicines.  Any blood disorders you have.  Any surgeries you have had.  Any medical conditions you have.  Whether you are pregnant or may be pregnant. What are the risks? Generally, this is a safe procedure. However, problems may occur, including:  Serious chest pain and heart attack. This is only a risk if the stress portion of the test is done.  Rapid heartbeat.  Sensation of warmth in your chest. This usually passes quickly.  What happens before the procedure?  Ask your health care provider about changing or stopping your regular medicines. This is especially important if you are taking diabetes medicines or blood thinners.  Remove your jewelry on the day of the procedure. What happens during the procedure?  An IV tube will be inserted into one of your veins.  Your health care provider will inject a small amount of  radioactive tracer through the tube.  You will wait for 20-40 minutes while the tracer travels through your bloodstream.  Your heart activity will be monitored with an electrocardiogram (ECG).  You will lie down on an exam table.  Images of your heart will be taken for about 15-20 minutes.  You may be asked to exercise on a treadmill or stationary bike. While you exercise, your heart's activity will be monitored with an ECG, and your blood pressure will be checked. If you are unable to exercise, you may be given a medicine to increase blood flow to parts of your heart.  When blood flow to your heart has peaked, a tracer will again be injected through the IV tube.  After 20-40 minutes, you will get back on the exam table and have more images taken of your heart.  When the procedure is over, your IV tube will be removed. The procedure may vary among health care providers and hospitals. Depending on the type of tracer used, scans may need to be repeated 3-4 hours later. What happens after the procedure?  Unless your health care provider tells you otherwise, you may return to your normal schedule, including diet, activities, and medicines.  Unless your health care provider tells you otherwise, you may increase your fluid intake. This will help flush the contrast dye from your body. Drink enough fluid to keep your urine clear or pale yellow.  It is up to you to get your test results. Ask your health care provider, or the department that is doing the test, when your results will be ready. Summary  A cardiac nuclear scan measures the blood flow to the heart when a person is resting and when he or she is exercising.  You may need this test if you are at risk for heart disease.  Tell your health care provider if you are pregnant.  Unless your health care provider tells you otherwise, increase your fluid intake. This will help flush the contrast dye from your body. Drink enough fluid to keep  your urine clear or pale yellow. This information is not intended to replace advice given to you by your health care provider. Make sure you discuss any questions you have with your health care provider. Document Released: 10/16/2004 Document Revised: 09/23/2016 Document Reviewed: 08/30/2013 Elsevier Interactive Patient Education  2017 [employer].

## 2017-06-23 ENCOUNTER — Ambulatory Visit
Admission: RE | Admit: 2017-06-23 | Discharge: 2017-06-23 | Disposition: A | Discharge status: Home / Self Care | Payer: Medicare HMO | Source: Ambulatory Visit | Attending: Cardiovascular Disease | Admitting: Cardiovascular Disease

## 2017-06-23 DIAGNOSIS — R9439 Abnormal result of other cardiovascular function study: Secondary | ICD-10-CM | POA: Insufficient documentation

## 2017-06-23 DIAGNOSIS — R079 Chest pain, unspecified: Secondary | ICD-10-CM | POA: Diagnosis not present

## 2017-06-23 LAB — NM MYOCAR MULTI W/SPECT W/WALL MOTION / EF
CHL CUP NUCLEAR SDS: 3
CHL CUP NUCLEAR SRS: 21
CHL CUP RESTING HR STRESS: 99 {beats}/min
CSEPED: 6 min
CSEPEDS: 0 s
CSEPEW: 7 METS
CSEPHR: 99 %
CSEPPHR: 148 {beats}/min
LV dias vol: 105 mL (ref 62–150)
LV sys vol: 39 mL
SSS: 24
TID: 1.21

## 2017-06-23 MED ORDER — TECHNETIUM TC 99M TETROFOSMIN IV KIT
13.0000 | PACK | Freq: Once | INTRAVENOUS | Status: AC | PRN
  Administered 2017-06-23: 13.8 via INTRAVENOUS

## 2017-06-23 MED ORDER — TECHNETIUM TC 99M TETROFOSMIN IV KIT
30.0000 | PACK | Freq: Once | INTRAVENOUS | Status: AC | PRN
  Administered 2017-06-23: 31.45 via INTRAVENOUS

## 2017-06-24 ENCOUNTER — Telehealth: Payer: Self-pay | Admitting: Cardiovascular Disease

## 2017-06-24 DIAGNOSIS — R9439 Abnormal result of other cardiovascular function study: Secondary | ICD-10-CM

## 2017-06-24 DIAGNOSIS — Z01810 Encounter for preprocedural cardiovascular examination: Secondary | ICD-10-CM

## 2017-06-24 NOTE — Telephone Encounter (Signed)
Patient given results of stress test. He agreed to go ahead and have heart cath scheduled. Patient will go to [facility] tomorrow for lab work and CXR. Patient verbalized understanding to the following instructions. Message sent to precert.    [facility] Cardiac Cath Instructions   You are scheduled for a Cardiac Cath on:_____9/26/18_____  Please arrive at _7:30am__am on the day of your procedure  Please expect a call from our [facility] to pre-register you  Do not eat/drink anything after midnight  Someone will need to drive you home  It is recommended someone be with you for the first 24 hours after your procedure  Wear clothes that are easy to get on/off and wear slip on shoes if possible   Medications bring a current list of all medications with you  _X_ You may take all of your medications the morning of your procedure with enough water to swallow safely   Day of your procedure: Arrive at the [facility] entrance.  Free valet service is available.  After entering the [facility] please check-in at the registration desk (1st desk on your right) to receive your armband. After receiving your armband someone will escort you to the cardiac cath/special procedures waiting area.  The usual length of stay after your procedure is about 2 to 3 hours.  This can vary.  If you have any questions, please call our office at [phone], or you may call the cardiac cath lab at [facility] directly at [phone]

## 2017-06-24 NOTE — Telephone Encounter (Signed)
Pt returning our call for test he did   Please call back

## 2017-06-25 ENCOUNTER — Ambulatory Visit
Admission: RE | Admit: 2017-06-25 | Discharge: 2017-06-25 | Disposition: A | Discharge status: Home / Self Care | Payer: Medicare HMO | Source: Ambulatory Visit | Attending: Cardiovascular Disease | Admitting: Cardiovascular Disease

## 2017-06-25 ENCOUNTER — Other Ambulatory Visit
Admission: RE | Admit: 2017-06-25 | Discharge: 2017-06-25 | Disposition: A | Discharge status: Home / Self Care | Payer: Medicare HMO | Source: Ambulatory Visit | Attending: Cardiovascular Disease | Admitting: Cardiovascular Disease

## 2017-06-25 ENCOUNTER — Ambulatory Visit (INDEPENDENT_AMBULATORY_CARE_PROVIDER_SITE_OTHER): Payer: Medicare HMO | Admitting: *Deleted

## 2017-06-25 DIAGNOSIS — R918 Other nonspecific abnormal finding of lung field: Secondary | ICD-10-CM | POA: Diagnosis not present

## 2017-06-25 DIAGNOSIS — R9439 Abnormal result of other cardiovascular function study: Secondary | ICD-10-CM

## 2017-06-25 DIAGNOSIS — Z01810 Encounter for preprocedural cardiovascular examination: Secondary | ICD-10-CM | POA: Insufficient documentation

## 2017-06-25 DIAGNOSIS — Z23 Encounter for immunization: Secondary | ICD-10-CM | POA: Diagnosis not present

## 2017-06-25 LAB — BASIC METABOLIC PANEL
ANION GAP: 11 (ref 5–15)
BUN: 15 mg/dL (ref 6–20)
CHLORIDE: 100 mmol/L — AB (ref 101–111)
CO2: 24 mmol/L (ref 22–32)
Calcium: 9.2 mg/dL (ref 8.9–10.3)
Creatinine, Ser: 0.93 mg/dL (ref 0.61–1.24)
GFR calc Af Amer: >60 mL/min (ref >60)
GFR calc non Af Amer: >60 mL/min (ref >60)
GLUCOSE: 117 mg/dL — AB (ref 65–99)
POTASSIUM: 3.3 mmol/L — AB (ref 3.5–5.1)
Sodium: 135 mmol/L (ref 135–145)

## 2017-06-25 LAB — CBC WITH DIFFERENTIAL/PLATELET
BASOS ABS: 0.1 10*3/uL (ref 0–0.1)
Basophils Relative: 1 %
Eosinophils Absolute: 0.3 10*3/uL (ref 0–0.7)
Eosinophils Relative: 5 %
HCT: 39.6 % — ABNORMAL LOW (ref 40.0–52.0)
HEMOGLOBIN: 14 g/dL (ref 13.0–18.0)
LYMPHS ABS: 2.2 10*3/uL (ref 1.0–3.6)
LYMPHS PCT: 30 %
MCH: 31.8 pg (ref 26.0–34.0)
MCHC: 35.4 g/dL (ref 32.0–36.0)
MCV: 89.9 fL (ref 80.0–100.0)
Monocytes Absolute: 0.6 10*3/uL (ref 0.2–1.0)
Monocytes Relative: 7 %
NEUTROS ABS: 4.4 10*3/uL (ref 1.4–6.5)
NEUTROS PCT: 57 %
Platelets: 233 10*3/uL (ref 150–440)
RBC: 4.4 MIL/uL (ref 4.40–5.90)
RDW: 13.8 % (ref 11.5–14.5)
WBC: 7.6 10*3/uL (ref 3.8–10.6)

## 2017-06-25 LAB — PROTIME-INR
INR: 1.01
Prothrombin Time: 13.2 seconds (ref 11.4–15.2)

## 2017-06-29 ENCOUNTER — Other Ambulatory Visit: Payer: Self-pay | Admitting: Cardiovascular Disease

## 2017-06-29 DIAGNOSIS — L821 Other seborrheic keratosis: Secondary | ICD-10-CM | POA: Diagnosis not present

## 2017-06-29 DIAGNOSIS — D229 Melanocytic nevi, unspecified: Secondary | ICD-10-CM | POA: Diagnosis not present

## 2017-06-29 DIAGNOSIS — I200 Unstable angina: Secondary | ICD-10-CM

## 2017-06-29 DIAGNOSIS — L814 Other melanin hyperpigmentation: Secondary | ICD-10-CM | POA: Diagnosis not present

## 2017-06-30 ENCOUNTER — Encounter
Admission: RE | Disposition: A | Discharge status: Home / Self Care | Payer: Self-pay | Source: Ambulatory Visit | Attending: Cardiovascular Disease

## 2017-06-30 ENCOUNTER — Other Ambulatory Visit: Payer: Self-pay | Admitting: Cardiovascular Disease

## 2017-06-30 ENCOUNTER — Ambulatory Visit
Admission: RE | Admit: 2017-06-30 | Discharge: 2017-06-30 | Disposition: A | Discharge status: Home / Self Care | Payer: Medicare HMO | Source: Ambulatory Visit | Attending: Cardiovascular Disease | Admitting: Cardiovascular Disease

## 2017-06-30 DIAGNOSIS — G4733 Obstructive sleep apnea (adult) (pediatric): Secondary | ICD-10-CM | POA: Insufficient documentation

## 2017-06-30 DIAGNOSIS — I2582 Chronic total occlusion of coronary artery: Secondary | ICD-10-CM | POA: Insufficient documentation

## 2017-06-30 DIAGNOSIS — I200 Unstable angina: Secondary | ICD-10-CM

## 2017-06-30 DIAGNOSIS — Z8774 Personal history of (corrected) congenital malformations of heart and circulatory system: Secondary | ICD-10-CM | POA: Diagnosis not present

## 2017-06-30 DIAGNOSIS — E785 Hyperlipidemia, unspecified: Secondary | ICD-10-CM | POA: Diagnosis not present

## 2017-06-30 DIAGNOSIS — Z87891 Personal history of nicotine dependence: Secondary | ICD-10-CM | POA: Diagnosis not present

## 2017-06-30 DIAGNOSIS — I10 Essential (primary) hypertension: Secondary | ICD-10-CM | POA: Diagnosis not present

## 2017-06-30 DIAGNOSIS — I2510 Atherosclerotic heart disease of native coronary artery without angina pectoris: Secondary | ICD-10-CM | POA: Diagnosis present

## 2017-06-30 DIAGNOSIS — I25110 Atherosclerotic heart disease of native coronary artery with unstable angina pectoris: Secondary | ICD-10-CM | POA: Insufficient documentation

## 2017-06-30 DIAGNOSIS — R9439 Abnormal result of other cardiovascular function study: Secondary | ICD-10-CM | POA: Diagnosis present

## 2017-06-30 HISTORY — PX: LEFT HEART CATH AND CORONARY ANGIOGRAPHY: CATH118249

## 2017-06-30 LAB — CARDIAC CATHETERIZATION: Cath EF Quantitative: 40 %

## 2017-06-30 SURGERY — LEFT HEART CATH AND CORONARY ANGIOGRAPHY
Anesthesia: Moderate Sedation | Laterality: Left

## 2017-06-30 SURGERY — LEFT HEART CATH
Anesthesia: Moderate Sedation

## 2017-06-30 MED ORDER — MIDAZOLAM HCL 2 MG/2ML IJ SOLN
INTRAMUSCULAR | Status: AC
  Filled 2017-06-30: qty 2

## 2017-06-30 MED ORDER — FENTANYL CITRATE (PF) 100 MCG/2ML IJ SOLN
INTRAMUSCULAR | Status: AC
  Filled 2017-06-30: qty 2

## 2017-06-30 MED ORDER — SODIUM CHLORIDE 0.9% FLUSH: 3.0000 mL | Freq: Two times a day (BID) | INTRAVENOUS | Status: DC

## 2017-06-30 MED ORDER — ASPIRIN 81 MG PO CHEW
81.0000 mg | CHEWABLE_TABLET | ORAL | Status: AC
Start: 2017-07-01 — End: 2017-06-30
  Administered 2017-06-30: 81 mg via ORAL

## 2017-06-30 MED ORDER — MIDAZOLAM HCL 2 MG/2ML IJ SOLN
INTRAMUSCULAR | Status: DC | PRN
  Administered 2017-06-30: 1 mg via INTRAVENOUS

## 2017-06-30 MED ORDER — ONDANSETRON HCL 4 MG/2ML IJ SOLN: 4.0000 mg | Freq: Four times a day (QID) | INTRAMUSCULAR | Status: DC | PRN

## 2017-06-30 MED ORDER — ACETAMINOPHEN 325 MG PO TABS
ORAL_TABLET | ORAL | Status: AC
  Filled 2017-06-30: qty 2

## 2017-06-30 MED ORDER — ACETAMINOPHEN 325 MG PO TABS
650.0000 mg | ORAL_TABLET | ORAL | Status: DC | PRN
  Administered 2017-06-30: 650 mg via ORAL

## 2017-06-30 MED ORDER — SODIUM CHLORIDE 0.9 % WEIGHT BASED INFUSION: 1.0000 mL/kg/h | INTRAVENOUS | Status: DC

## 2017-06-30 MED ORDER — FENTANYL CITRATE (PF) 100 MCG/2ML IJ SOLN
INTRAMUSCULAR | Status: DC | PRN
  Administered 2017-06-30: 25 ug via INTRAVENOUS

## 2017-06-30 MED ORDER — HEPARIN (PORCINE) IN NACL 2-0.9 UNIT/ML-% IJ SOLN
INTRAMUSCULAR | Status: AC
  Filled 2017-06-30: qty 500

## 2017-06-30 MED ORDER — ASPIRIN 81 MG PO CHEW
CHEWABLE_TABLET | ORAL | Status: AC
  Filled 2017-06-30: qty 1

## 2017-06-30 MED ORDER — SODIUM CHLORIDE 0.9 % WEIGHT BASED INFUSION
3.0000 mL/kg/h | INTRAVENOUS | Status: AC
  Administered 2017-06-30: 3 mL/kg/h via INTRAVENOUS

## 2017-06-30 MED ORDER — METOPROLOL SUCCINATE ER 25 MG PO TB24: 25.0000 mg | ORAL_TABLET | Freq: Every day | ORAL | 3 refills | Status: DC

## 2017-06-30 MED ORDER — SODIUM CHLORIDE 0.9% FLUSH: 3.0000 mL | INTRAVENOUS | Status: DC | PRN

## 2017-06-30 MED ORDER — SODIUM CHLORIDE 0.9 % IV SOLN: 250.0000 mL | INTRAVENOUS | Status: DC | PRN

## 2017-06-30 MED ORDER — IOPAMIDOL (ISOVUE-300) INJECTION 61%
INTRAVENOUS | Status: DC | PRN
  Administered 2017-06-30: 125 mL via INTRA_ARTERIAL

## 2017-06-30 SURGICAL SUPPLY — 10 items
CATH INFINITI 5FR ANG PIGTAIL (CATHETERS) ×2 IMPLANT
CATH INFINITI 5FR JL4 (CATHETERS) ×2 IMPLANT
CATH INFINITI JR4 5F (CATHETERS) ×2 IMPLANT
DEVICE CLOSURE MYNXGRIP 5F (Vascular Products) ×2 IMPLANT
KIT MANI 3VAL PERCEP (MISCELLANEOUS) ×2 IMPLANT
NEEDLE PERC 18GX7CM (NEEDLE) ×2 IMPLANT
NEEDLE SMART 18G ACCESS (NEEDLE) ×2 IMPLANT
PACK CARDIAC CATH (CUSTOM PROCEDURE TRAY) ×2 IMPLANT
SHEATH AVANTI 5FR X 11CM (SHEATH) ×2 IMPLANT
WIRE EMERALD 3MM-J .035X150CM (WIRE) ×2 IMPLANT

## 2017-06-30 NOTE — Discharge Instructions (Signed)
1. Adding low dose Metoprolol to start TODAY - pick up from your pharmacy 2. Cardiac Rehab - expect a call from Cardiac Rehab to schedule visits  Dr. Rockey Situ recommends waiting to drive for 5 days post procedure

## 2017-07-01 NOTE — H&P (Signed)
H&P Addendum, precardiac catheterization  Patient was seen and evaluated prior to Cardiac catheterization procedure Symptoms, prior testing details again confirmed with the patient Patient examined, no significant change from prior exam Lab work reviewed in detail personally by myself Patient understands risk and benefit of the procedure, willing to proceed  Signed, Tim Gollan, MD, Ph.D [facility]    

## 2017-07-06 ENCOUNTER — Encounter: Payer: Self-pay | Admitting: Nurse Practitioner

## 2017-07-06 ENCOUNTER — Ambulatory Visit (INDEPENDENT_AMBULATORY_CARE_PROVIDER_SITE_OTHER): Payer: Medicare HMO | Admitting: Nurse Practitioner

## 2017-07-06 VITALS — BP 124/70 | HR 74 | Ht 73.0 in | Wt 219.5 lb

## 2017-07-06 DIAGNOSIS — I25118 Atherosclerotic heart disease of native coronary artery with other forms of angina pectoris: Secondary | ICD-10-CM | POA: Diagnosis not present

## 2017-07-06 DIAGNOSIS — E782 Mixed hyperlipidemia: Secondary | ICD-10-CM | POA: Diagnosis not present

## 2017-07-06 DIAGNOSIS — I10 Essential (primary) hypertension: Secondary | ICD-10-CM | POA: Diagnosis not present

## 2017-07-06 DIAGNOSIS — I255 Ischemic cardiomyopathy: Secondary | ICD-10-CM

## 2017-07-06 MED ORDER — VERAPAMIL HCL ER 240 MG PO TBCR: 240.0000 mg | EXTENDED_RELEASE_TABLET | Freq: Every day | ORAL | 6 refills | Status: DC

## 2017-07-06 MED ORDER — NITROGLYCERIN 0.4 MG SL SUBL: 0.4000 mg | SUBLINGUAL_TABLET | SUBLINGUAL | 6 refills | Status: DC | PRN

## 2017-07-06 MED ORDER — METOPROLOL SUCCINATE ER 50 MG PO TB24: 50.0000 mg | ORAL_TABLET | Freq: Every day | ORAL | 6 refills | Status: DC

## 2017-07-06 MED ORDER — ISOSORBIDE MONONITRATE ER 30 MG PO TB24: 15.0000 mg | ORAL_TABLET | Freq: Every day | ORAL | 6 refills | Status: DC

## 2017-07-06 NOTE — Progress Notes (Signed)
Office Visit    Patient Name: Mitchell Stephens. Date of Encounter: 07/06/2017  Primary Care Provider:  Gayland Curry, DO Primary Cardiologist:  Johnny Bridge, MD   Chief Complaint    71 year old male with recent finding of coronary artery disease and ischemic cardiomyopathy and prior history of hypertension, hyperlipidemia, PSVT, atrial septal defect repair, and sleep apnea, who presents for follow-up.  Past Medical History    Past Medical History:  Diagnosis Date  . Coronary artery disease    a. 06/2017 MV: EF 45%, large inf and apical defect w/o ischemia;  b. 06/2017 Cath: LM nl, LAD 30ost, LCX 50p, OM1/2 ok, RCA 100p w/ L->R collats. EF 35%.  . Depression with anxiety   . Gastric ulcer, acute with hemorrhage 2001   Dr. Sharlet Salina, [facility]  . H/O congenital atrial septal defect (ASD) repair   . Helicobacter pylori gastritis   . Heme positive stool   . History of gout   . History of peptic ulcer disease   . History of PSVT (paroxysmal supraventricular tachycardia)   . Hyperlipidemia   . Hypertension   . Ischemic cardiomyopathy    a. 06/2017 LV gram: EF 35%.  . Obstructive sleep apnea    on CPAP  . Perennial allergic rhinitis   . Tobacco abuse    Past Surgical History:  Procedure Laterality Date  . ASD REPAIR, OSTIUM PRIMUM  1966  . 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 LEFT HEART CATH AND CORONARY ANGIOGRAPHY Left 06/30/2017   Procedure: LEFT HEART CATH AND CORONARY ANGIOGRAPHY;  Surgeon: Minna Merritts, MD;  Location: [place] [facility];  Service: Cardiovascular;  Laterality: Left;  . open heart surgery  1966   Dr. Annamaria Boots ([facility]); was PFO repair--has been normal in f/u  . PALATE / UVULA BIOPSY / EXCISION  2000   Dr.Sprenhe [facility])  . TONSILLECTOMY AND ADENOIDECTOMY  1959    Allergies  Allergies  Allergen Reactions  . Pollen Extract Other (See Comments)    Stuffy nose/itchy throat (Seasonal allergies)    History of Present Illness      71 year old male with the above complex past medical history including remote atrial septal defect repair, hypertension, hyperlipidemia, sleep apnea, and remote tobacco and alcohol use. He was recently evaluated in clinic and due to exertional angina and dyspnea. Stress testing was undertaken and was abnormal with a finding of a large area of inferior apical scar without ischemia. EF was 45%. 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 EF 35%. Following catheterization, he was placed on beta blocker therapy and his verapamil dose was supposed to be reduced from 480 mg daily to 360 mg daily. He has not yet picked up the prescription for 360 mg and instead has been continuing to take 480 mg. He is tolerating beta blocker therapy. He has not been having any significant chest pain but also has not been pushing himself. He did note dyspnea on exertion on a few occasions since his catheterization but nothing particularly profound. He has many questions today and we have spent 1 hour discussing his case and answering questions. His wife is with him as well. He denies palpitations, PND, orthopnea, dizziness, syncope, edema, or early satiety.  Home Medications    Prior to Admission medications   Medication Sig Start Date End Date Taking? Authorizing Provider  aspirin EC 81 MG tablet Take 81 mg by  mouth daily.   Yes [provider]  buPROPion (WELLBUTRIN XL) 300 MG 24 hr tablet Take 300 mg by mouth daily.   Yes [provider]  hydrochlorothiazide (HYDRODIURIL) 25 MG tablet Take one tablet by mouth once daily 11/25/16  Yes Reed, Tiffany L, DO  meloxicam (MOBIC) 15 MG tablet Take 1 tablet (15 mg total) by mouth as needed for pain. Patient taking differently: Take 15 mg by mouth daily.  03/26/17  Yes Reed, Tiffany L, DO  metoprolol succinate (TOPROL-XL) 50 MG 24 hr tablet Take 1 tablet (50 mg total) by mouth  daily. 07/06/17  Yes Rogelia Mire, NP  naphazoline-glycerin (CLEAR EYES) 0.012-0.2 % SOLN Place 1-2 drops into both eyes 4 (four) times daily as needed (for redness).   Yes [provider]  oxymetazoline (AFRIN) 0.05 % nasal spray Place 1 spray into both nostrils 2 (two) times daily as needed for congestion.   Yes [provider]  senna (SENOKOT) 8.6 MG tablet Take 2 tablets by mouth at bedtime.   Yes [provider]  simvastatin (ZOCOR) 40 MG tablet Take one tablet by mouth once daily Patient taking differently: Take 40 mg by mouth at bedtime. Take one tablet by mouth once daily 11/25/16  Yes Reed, Tiffany L, DO  traZODone (DESYREL) 50 MG tablet TAKE 1 TABLET AT BEDTIME AS NEEDED  FOR  INSOMNIA Patient taking differently: Take 50 mg by mouth at bedtime as needed for sleep. TAKE 1 TABLET AT BEDTIME AS NEEDED  FOR  INSOMNIA 11/25/16  Yes Reed, Tiffany L, DO  verapamil (CALAN-SR) 240 MG CR tablet Take 1 tablet (240 mg total) by mouth daily. 07/06/17  Yes Rogelia Mire, NP  isosorbide mononitrate (IMDUR) 30 MG 24 hr tablet Take 0.5 tablets (15 mg total) by mouth daily. 07/06/17 10/04/17  Rogelia Mire, NP  nitroGLYCERIN (NITROSTAT) 0.4 MG SL tablet Place 1 tablet (0.4 mg total) under the tongue every 5 (five) minutes as needed for chest pain. 07/06/17 07/31/17  Rogelia Mire, NP    Review of Systems    He has had some dyspnea on exertion. He has not had recurrence of chest discomfort and denies PND, orthopnea, dizziness, syncope, edema, early satiety, or palpitations.  All other systems reviewed and are otherwise negative except as noted above.  Physical Exam    VS:  BP 124/70 (BP Location: Left Arm, Patient Position: Sitting, Cuff Size: Normal)   Pulse 74   Ht 6\' 1"  (1.854 m)   Wt 219 lb 8 oz (99.6 kg)   BMI 28.96 kg/m  , BMI Body mass index is 28.96 kg/m. GEN: Well nourished, well developed, in no acute distress.  HEENT: normal.  Neck:  Supple, no JVD, carotid bruits, or masses. Cardiac: RRR, no murmurs, rubs, or gallops. No clubbing, cyanosis, edema.  Radials/DP/PT 2+ and equal bilaterally. Right groin catheterization site is ecchymotic without bleeding, bruit, or hematoma. Respiratory:  Respirations regular and unlabored, clear to auscultation bilaterally. GI: Soft, nontender, nondistended, BS + x 4. MS: no deformity or atrophy. Skin: warm and dry, no rash. Neuro:  Strength and sensation are intact. Psych: Normal affect.  Accessory Clinical Findings    ECG - Regular sinus rhythm, 74, prior inferior infarct, no acute ST or T changes.  Assessment & Plan    1.  Coronary artery disease/stable angina: Patient was recently found to have an occluded right coronary artery with left-to-right collaterals. He does have moderate LV dysfunction with EF of 35% by  left ventricular. Since his catheterization, he has not had any chest discomfort but has noticed some dyspnea on exertion. In the setting of cardiomyopathy, we are in the process of transitioning off of calcium channel blocker therapy on to beta blocker therapy. I will increase his Toprol-XL to 50 mg today. In so doing, I have asked him to reduce his verapamil 240 mg. He remains on aspirin and statin therapy. I'm adding low-dose long-acting nitrate therapy in the setting of dyspnea on exertion. I have also provided him a prescription for short-acting nitroglycerin glycerin. If patient develops recurrent angina despite maximal medical therapy over time, we can consider referral to Dr. Martinique or [place] Lack for PCI of chronic total occlusion.    2. Ischemic Cardiomyopathy: EF 35% by ventriculogram. He is euvolemic on exam. As above, titrating beta blocker while we titrate his calcium channel blocker down with a plan to discontinue. I will plan to see him back in approximately 3 weeks at which point we can hopefully discontinue calcium channel blocker, titrating beta blocker further, and  add ARB. Provided he tolerates that and labs are stable, we can look add spironolactone in the future. Ultimately, he will require follow-up echocardiography to reevaluate LV function once he has been on maximal medical therapy.  We discussed the importance of daily weights, sodium restriction, medication compliance, and symptom reporting and he verbalizes understanding.     3. Essential hypertension: Stable.  4. Hyperlipidemia: LDL of 60 in November 2017. He remains on statin therapy.  5. Morbid obesity: Patient is interested in exercise regimen I have encouraged him to walk regularly.  6. Disposition: We spent a total of 1 hour discussing his previous symptoms, stress test findings, catheterization results and anatomy, LV dysfunction, heart failure education, prognosis, and future plans for therapy.  I will see him back in a few weeks for further titration of medications.   Murray Hodgkins, NP 07/06/2017, 5:59 PM

## 2017-07-06 NOTE — Patient Instructions (Signed)
Medication Instructions:  Please DECREASE Verapamil to 240 mg once daily Please INCREASE Toprol to 50 mg once daily Please START Imdur 15 mg once daily (1/2 tab)  Please pick up Nitro SL to be used as needed for chest pain  Labwork: None  Testing/Procedures: None  Follow-Up: 3-4 weeks    If you need a refill on your cardiac medications before your next appointment, please call your pharmacy.

## 2017-07-27 ENCOUNTER — Encounter: Payer: Self-pay | Admitting: Nurse Practitioner

## 2017-07-27 ENCOUNTER — Ambulatory Visit (INDEPENDENT_AMBULATORY_CARE_PROVIDER_SITE_OTHER): Payer: Medicare HMO | Admitting: Nurse Practitioner

## 2017-07-27 VITALS — BP 102/70 | HR 61 | Ht 72.0 in | Wt 214.5 lb

## 2017-07-27 DIAGNOSIS — I255 Ischemic cardiomyopathy: Secondary | ICD-10-CM

## 2017-07-27 DIAGNOSIS — E785 Hyperlipidemia, unspecified: Secondary | ICD-10-CM | POA: Diagnosis not present

## 2017-07-27 DIAGNOSIS — I10 Essential (primary) hypertension: Secondary | ICD-10-CM

## 2017-07-27 DIAGNOSIS — I25118 Atherosclerotic heart disease of native coronary artery with other forms of angina pectoris: Secondary | ICD-10-CM

## 2017-07-27 MED ORDER — LOSARTAN POTASSIUM 25 MG PO TABS: 25.0000 mg | ORAL_TABLET | Freq: Every day | ORAL | 3 refills | Status: DC

## 2017-07-27 MED ORDER — METOPROLOL SUCCINATE ER 100 MG PO TB24: 100.0000 mg | ORAL_TABLET | Freq: Every day | ORAL | 3 refills | Status: DC

## 2017-07-27 NOTE — Patient Instructions (Signed)
Medication Instructions:  Please STOP verapamil Please INCREASE Toprol to 100 mg once daily Please START Losartan 25 mg once daily  Labwork: Nurse visit for BP check and BMET in 1 week  Testing/Procedures: None  Follow-Up: 4-6 weeks with Ignacia Bayley, NP or Dr. Rockey Situ  If you need a refill on your cardiac medications before your next appointment, please call your pharmacy.

## 2017-07-27 NOTE — Progress Notes (Addendum)
Office Visit    Patient Name: Mitchell Stephens. Date of Encounter: 07/27/2017  Primary Care Provider:  Gayland Curry, DO Primary Cardiologist:  Johnny Bridge, MD   Chief Complaint    71 y/o ? with recent finding of CAD and ischemic cardia myopathy and prior history of hypertension, hyperlipidemia, PSVT, atrial septal defect status post repair, and sleep apnea, who presents for follow-up.  Past Medical History    Past Medical History:  Diagnosis Date  . Coronary artery disease    a. 06/2017 MV: EF 45%, large inf and apical defect w/o ischemia;  b. 06/2017 Cath: LM nl, LAD 30ost, LCX 50p, OM1/2 ok, RCA 100p w/ L->R collats. EF 35%.  . Depression with anxiety   . Gastric ulcer, acute with hemorrhage 2001   Dr. Sharlet Salina, [facility]  . H/O congenital atrial septal defect (ASD) repair   . Helicobacter pylori gastritis   . Heme positive stool   . History of gout   . History of peptic ulcer disease   . History of PSVT (paroxysmal supraventricular tachycardia)   . Hyperlipidemia   . Hypertension   . Ischemic cardiomyopathy    a. 06/2017 LV gram: EF 35%.  . Obstructive sleep apnea    on CPAP  . Perennial allergic rhinitis   . Tobacco abuse    Past Surgical History:  Procedure Laterality Date  . ASD REPAIR, OSTIUM PRIMUM  1966  . 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 LEFT HEART CATH AND CORONARY ANGIOGRAPHY Left 06/30/2017   Procedure: LEFT HEART CATH AND CORONARY ANGIOGRAPHY;  Surgeon: Minna Merritts, MD;  Location: [place] [facility];  Service: Cardiovascular;  Laterality: Left;  . open heart surgery  1966   Dr. Annamaria Boots ([facility]); was PFO repair--has been normal in f/u  . PALATE / UVULA BIOPSY / EXCISION  2000   Dr.Sprenhe [facility])  . TONSILLECTOMY AND ADENOIDECTOMY  1959    Allergies  Allergies  Allergen Reactions  . Pollen Extract Other (See Comments)    Stuffy nose/itchy throat (Seasonal allergies)    History of Present Illness    71 year old ?  with the above complex past medical history including remote atrial septal defect repair, hypertension, hyperlipidemia, sleep apnea, and remote tobacco and alcohol abuse.  Recent evaluation for exertional angina and dyspnea included stress testing which showed a large area of inferior apical scar without ischemia and an EF of 45%.  Diagnostic catheterization then showed an occluded proximal right coronary artery with left to right collaterals and EF of 35%.  Following this finding, we have been working to switch him from high dose of verapamil, which he had been taking in the past for PSVT, over to beta-blocker therapy.  I saw him on October 2, at which time he was for the most part doing well but was having some exertional angina.  I added low-dose nitrate therapy and titrate his beta-blocker while reducing his calcium channel blocker dose.  Since his last visit, he has done reasonably well.  He is walking most days of the week, often at 4 mph.  It is not uncommon after walking for about 1/2 mile or so, for him to experience some substernal chest discomfort.  In that case, he will reduce his pace very slightly with resolution of chest discomfort and just a few minutes.  He will complete his exercise, which is typically walking for about 1-1/2 miles.  At more normal levels of activity, he  has not been having any symptoms or limitations.  He denies PND, orthopnea, dizziness, syncope, edema, or early satiety.  His weight has been stable at home.  Home Medications    Prior to Admission medications   Medication Sig Start Date End Date Taking? Authorizing Provider  aspirin EC 81 MG tablet Take 81 mg by mouth daily.   Yes [provider]  buPROPion (WELLBUTRIN XL) 300 MG 24 hr tablet Take 300 mg by mouth daily.   Yes [provider]  hydrochlorothiazide (HYDRODIURIL) 25 MG tablet Take one tablet by mouth once daily 11/25/16  Yes Reed, Tiffany L, DO  isosorbide mononitrate (IMDUR) 30 MG 24 hr  tablet Take 0.5 tablets (15 mg total) by mouth daily. 07/06/17 10/04/17 Yes Rogelia Mire, NP  meloxicam (MOBIC) 15 MG tablet Take 1 tablet (15 mg total) by mouth as needed for pain. Patient taking differently: Take 15 mg by mouth daily.  03/26/17  Yes Reed, Tiffany L, DO  metoprolol succinate (TOPROL-XL) 100 MG 24 hr tablet Take 1 tablet (100 mg total) by mouth daily. 07/27/17  Yes Rogelia Mire, NP  naphazoline-glycerin (CLEAR EYES) 0.012-0.2 % SOLN Place 1-2 drops into both eyes 4 (four) times daily as needed (for redness).   Yes [provider]  nitroGLYCERIN (NITROSTAT) 0.4 MG SL tablet Place 1 tablet (0.4 mg total) under the tongue every 5 (five) minutes as needed for chest pain. 07/06/17 07/31/17 Yes Rogelia Mire, NP  oxymetazoline (AFRIN) 0.05 % nasal spray Place 1 spray into both nostrils 2 (two) times daily as needed for congestion.   Yes [provider]  senna (SENOKOT) 8.6 MG tablet Take 2 tablets by mouth at bedtime.   Yes [provider]  simvastatin (ZOCOR) 40 MG tablet Take one tablet by mouth once daily Patient taking differently: Take 40 mg by mouth at bedtime. Take one tablet by mouth once daily 11/25/16  Yes Reed, Tiffany L, DO  traZODone (DESYREL) 50 MG tablet TAKE 1 TABLET AT BEDTIME AS NEEDED  FOR  INSOMNIA Patient taking differently: Take 50 mg by mouth at bedtime as needed for sleep. TAKE 1 TABLET AT BEDTIME AS NEEDED  FOR  INSOMNIA 11/25/16  Yes Reed, Tiffany L, DO  losartan (COZAAR) 25 MG tablet Take 1 tablet (25 mg total) by mouth daily. 07/27/17 10/25/17  Rogelia Mire, NP    Review of Systems    He has noted exertional angina at high levels of activity.  He otherwise denies dyspnea on exertion, palpitations, PND, orthopnea, dizziness, syncope, edema, or early satiety.  All other systems reviewed and are otherwise negative except as noted above.  Physical Exam    VS:  BP 102/70 (BP Location: Left Arm, Patient  Position: Sitting, Cuff Size: Normal)   Pulse 61   Ht 6' (1.829 m)   Wt 214 lb 8 oz (97.3 kg)   BMI 29.09 kg/m  , BMI Body mass index is 29.09 kg/m. GEN: Well nourished, well developed, in no acute distress.  HEENT: normal.  Neck: Supple, no JVD, carotid bruits, or masses. Cardiac: RRR, no murmurs, rubs, or gallops. No clubbing, cyanosis, edema.  Radials/DP/PT 2+ and equal bilaterally.  Respiratory:  Respirations regular and unlabored, clear to auscultation bilaterally. GI: Soft, nontender, nondistended, BS + x 4. MS: no deformity or atrophy. Skin: warm and dry, no rash. Neuro:  Strength and sensation are intact. Psych: Normal affect.  Accessory Clinical Findings    BMET in 1 wk w/ BP check  ECG: Regular sinus rhythm, 61, first-degree AV block, IVCD.  No acute changes.  Assessment & Plan    1.  Coronary artery disease/unstable angina: Patient recently found to have an occluded right coronary artery with left-to-right collaterals.  He has moderate LV dysfunction with an EF 35% by left ventriculography.  At his last visit, he reported some dyspnea on exertion and I added low-dose nitrate therapy.  He has had resolution of dyspnea on exertion and is walking up to 4 mph over a period of about an hour and 15 minutes or so.  When he hits the top speed, he does sometimes have exertional angina and will slow down some.  He has not had any symptoms at rest or at more normal levels of activity.  I am going to titrate his beta-blocker today and discontinue verapamil.  Continue current dose of nitrate therapy given soft blood pressure.  He is advised to contact us for any worsening of angina as his occluded right coronary artery is likely amenable for CTO PCI per Drs. [place] and [place].  He will begin cardiac rehab at [facility] later this week.  2.  Ischemic cardiomyopathy: EF 35% by ventriculogram.  Euvolemic on exam.  He is not having dyspnea on exertion.  As above, I will titrate  beta-blocker further today and also discontinue verapamil altogether.  Adding low-dose losartan.  Plan to follow-up a basic metabolic panel and blood pressure check in 1 week.  If both are stable, I would look to add Spironolactone therapy next.  If pressure remains stable on ARB and spinal lactone, we can look to switch him over to [facility] for EF of less than 40% with long-term plan of following up echocardiogram once on optimal medical therapy.  3.  Essential hypertension: Blood pressure is soft today.  4.  Hyperlipidemia: LDL of 60 November 2017.  He remains on statin therapy.  5.  Morbid obesity: He is walking regularly and will enroll in cardiac rehab as of this Thursday.  6.  Disposition: Basic metabolic panel and blood pressure check in 1 week.  Look to add spironolactone if stable.  Follow-up in clinic in 4-6 weeks.  Murray Hodgkins, NP 07/27/2017, 6:15 PM

## 2017-07-29 ENCOUNTER — Encounter: Payer: Medicare HMO | Attending: Cardiovascular Disease | Admitting: *Deleted

## 2017-07-29 VITALS — Ht 72.5 in | Wt 220.3 lb

## 2017-07-29 DIAGNOSIS — I5022 Chronic systolic (congestive) heart failure: Secondary | ICD-10-CM | POA: Diagnosis not present

## 2017-07-29 DIAGNOSIS — I214 Non-ST elevation (NSTEMI) myocardial infarction: Secondary | ICD-10-CM | POA: Diagnosis not present

## 2017-07-29 NOTE — Patient Instructions (Signed)
Patient Instructions  Patient Details  Name: Mitchell Stephens. MRN: [id number] Date of Birth: 10-Dec-1945 Referring Provider:  Minna Merritts, MD  Below are the personal goals you chose as well as exercise and nutrition goals. Our goal is to help you keep on track towards obtaining and maintaining your goals. We will be discussing your progress on these goals with you throughout the program.  Initial Exercise Prescription:     Initial Exercise Prescription - 07/29/17 1400      Date of Initial Exercise RX and Referring Provider   Date 07/29/17   Referring Provider Gollan     Treadmill   MPH 2.4   Grade 0   Minutes 15   METs 2.8     Recumbant Bike   Level 3   RPM 60   Watts 28   Minutes 15   METs 2.8     T5 Nustep   Level 3   SPM 80   Minutes 15   METs 2.8     Prescription Details   Frequency (times per week) 3   Duration Progress to 45 minutes of aerobic exercise without signs/symptoms of physical distress     Intensity   THRR 40-80% of Max Heartrate 97-131   Ratings of Perceived Exertion 11-13   Perceived Dyspnea 0-4     Resistance Training   Training Prescription Yes   Weight 4 lb   Reps 10-15      Exercise Goals: Frequency: Be able to perform aerobic exercise three times per week working toward 3-5 days per week.  Intensity: Work with a perceived exertion of 11 (fairly light) - 15 (hard) as tolerated. Follow your new exercise prescription and watch for changes in prescription as you progress with the program. Changes will be reviewed with you when they are made.  Duration: You should be able to do 30 minutes of continuous aerobic exercise in addition to a 5 minute warm-up and a 5 minute cool-down routine.  Nutrition Goals: Your personal nutrition goals will be established when you do your nutrition analysis with the dietician.  The following are nutrition guidelines to follow: Cholesterol < 200mg /day Sodium < 1500mg /day Fiber: Men over 50  yrs - 30 grams per day  Personal Goals:     Personal Goals and Risk Factors at Admission - 07/29/17 1420      Core Components/Risk Factors/Patient Goals on Admission    Weight Management Yes;Weight Loss   Intervention Obesity: Provide education and appropriate resources to help participant work on and attain dietary goals.;Weight Management/Obesity: Establish reasonable short term and long term weight goals.   Admit Weight 220 lb 4.8 oz (99.9 kg)   Expected Outcomes Short Term: Continue to assess and modify interventions until short term weight is achieved;Long Term: Adherence to nutrition and physical activity/exercise program aimed toward attainment of established weight goal;Weight Loss: Understanding of general recommendations for a balanced deficit meal plan, which promotes 1-2 lb weight loss per week and includes a negative energy balance of [phone] kcal/d;Understanding recommendations for meals to include 15-35% energy as protein, 25-35% energy from fat, 35-60% energy from carbohydrates, less than 200mg  of dietary cholesterol, 20-35 gm of total fiber daily;Understanding of distribution of calorie intake throughout the day with the consumption of 4-5 meals/snacks   Heart Failure Yes   Intervention Provide a combined exercise and nutrition program that is supplemented with education, support and counseling about heart failure. Directed toward relieving symptoms such as shortness of breath, decreased exercise tolerance, and  extremity edema.   Expected Outcomes Improve functional capacity of life;Short term: Attendance in program 2-3 days a week with increased exercise capacity. Reported lower sodium intake. Reported increased fruit and vegetable intake. Reports medication compliance.;Short term: Daily weights obtained and reported for increase. Utilizing diuretic protocols set by physician.;Long term: Adoption of self-care skills and reduction of barriers for early signs and symptoms recognition  and intervention leading to self-care maintenance.   Hypertension Yes   Intervention Provide education on lifestyle modifcations including regular physical activity/exercise, weight management, moderate sodium restriction and increased consumption of fresh fruit, vegetables, and low fat dairy, alcohol moderation, and smoking cessation.;Monitor prescription use compliance.   Expected Outcomes Short Term: Continued assessment and intervention until BP is < 140/65mm HG in hypertensive participants. < 130/94mm HG in hypertensive participants with diabetes, heart failure or chronic kidney disease.;Long Term: Maintenance of blood pressure at goal levels.   Lipids Yes   Intervention Provide education and support for participant on nutrition & aerobic/resistive exercise along with prescribed medications to achieve LDL 70mg , HDL >40mg .   Expected Outcomes Short Term: Participant states understanding of desired cholesterol values and is compliant with medications prescribed. Participant is following exercise prescription and nutrition guidelines.;Long Term: Cholesterol controlled with medications as prescribed, with individualized exercise RX and with personalized nutrition plan. Value goals: LDL < 70mg , HDL > 40 mg.   Stress Yes  Financial, a lot has happened this past year with his health and his wife's. Insurance is proving to be difficult to deal with and is causing lots of stress   Intervention Offer individual and/or small group education and counseling on adjustment to heart disease, stress management and health-related lifestyle change. Teach and support self-help strategies.;Refer participants experiencing significant psychosocial distress to appropriate mental health specialists for further evaluation and treatment. When possible, include family members and significant others in education/counseling sessions.   Expected Outcomes Short Term: Participant demonstrates changes in health-related behavior,  relaxation and other stress management skills, ability to obtain effective social support, and compliance with psychotropic medications if prescribed.;Long Term: Emotional wellbeing is indicated by absence of clinically significant psychosocial distress or social isolation.      Tobacco Use Initial Evaluation: History  Smoking Status  . Former Smoker  . Years: 40.00  . Quit date: 09/04/2015  Smokeless Tobacco  . Former Systems developer    Exercise Goals and Review:     Exercise Goals    Row Name 07/29/17 1430             Exercise Goals   Increase Physical Activity Yes       Intervention Provide advice, education, support and counseling about physical activity/exercise needs.;Develop an individualized exercise prescription for aerobic and resistive training based on initial evaluation findings, risk stratification, comorbidities and participant's personal goals.       Expected Outcomes Achievement of increased cardiorespiratory fitness and enhanced flexibility, muscular endurance and strength shown through measurements of functional capacity and personal statement of participant.       Increase Strength and Stamina Yes       Intervention Provide advice, education, support and counseling about physical activity/exercise needs.;Develop an individualized exercise prescription for aerobic and resistive training based on initial evaluation findings, risk stratification, comorbidities and participant's personal goals.       Expected Outcomes Achievement of increased cardiorespiratory fitness and enhanced flexibility, muscular endurance and strength shown through measurements of functional capacity and personal statement of participant.       Able to understand and use  rate of perceived exertion (RPE) scale Yes       Intervention Provide education and explanation on how to use RPE scale       Expected Outcomes Short Term: Able to use RPE daily in rehab to express subjective intensity level;Long Term:   Able to use RPE to guide intensity level when exercising independently       Able to understand and use Dyspnea scale Yes       Intervention Provide education and explanation on how to use Dyspnea scale       Expected Outcomes Short Term: Able to use Dyspnea scale daily in rehab to express subjective sense of shortness of breath during exertion;Long Term: Able to use Dyspnea scale to guide intensity level when exercising independently       Knowledge and understanding of Target Heart Rate Range (THRR) Yes       Intervention Provide education and explanation of THRR including how the numbers were predicted and where they are located for reference       Expected Outcomes Short Term: Able to state/look up THRR;Long Term: Able to use THRR to govern intensity when exercising independently;Short Term: Able to use daily as guideline for intensity in rehab       Able to check pulse independently Yes       Intervention Provide education and demonstration on how to check pulse in carotid and radial arteries.;Review the importance of being able to check your own pulse for safety during independent exercise       Expected Outcomes Short Term: Able to explain why pulse checking is important during independent exercise;Long Term: Able to check pulse independently and accurately       Understanding of Exercise Prescription Yes       Intervention Provide education, explanation, and written materials on patient's individual exercise prescription       Expected Outcomes Short Term: Able to explain program exercise prescription;Long Term: Able to explain home exercise prescription to exercise independently          Copy of goals given to participant.

## 2017-07-29 NOTE — Progress Notes (Signed)
Cardiac Individual Treatment Plan  Patient Details  Name: Mitchell Stephens. MRN: [id number] Date of Birth: 03-Mar-1946 Referring Provider:     Cardiac Rehab from 07/29/2017 in [facility] Cardiac and Pulmonary Rehab  Referring Provider  Gollan      Initial Encounter Date:    Cardiac Rehab from 07/29/2017 in [facility] Cardiac and Pulmonary Rehab  Date  07/29/17  Referring Provider  Rockey Situ      Visit Diagnosis: Heart failure, chronic systolic (HCC)  NSTEMI (non-ST elevated myocardial infarction) (New Site)  Patient's Home Medications on Admission:  Current Outpatient Prescriptions:  .  aspirin EC 81 MG tablet, Take 81 mg by mouth daily., Disp: , Rfl:  .  buPROPion (WELLBUTRIN XL) 300 MG 24 hr tablet, Take 300 mg by mouth daily., Disp: , Rfl:  .  hydrochlorothiazide (HYDRODIURIL) 25 MG tablet, Take one tablet by mouth once daily, Disp: 90 tablet, Rfl: 2 .  isosorbide mononitrate (IMDUR) 30 MG 24 hr tablet, Take 0.5 tablets (15 mg total) by mouth daily., Disp: 30 tablet, Rfl: 6 .  losartan (COZAAR) 25 MG tablet, Take 1 tablet (25 mg total) by mouth daily., Disp: 90 tablet, Rfl: 3 .  meloxicam (MOBIC) 15 MG tablet, Take 1 tablet (15 mg total) by mouth as needed for pain. (Patient taking differently: Take 15 mg by mouth daily. ), Disp: 90 tablet, Rfl: 0 .  metoprolol succinate (TOPROL-XL) 100 MG 24 hr tablet, Take 1 tablet (100 mg total) by mouth daily., Disp: 90 tablet, Rfl: 3 .  metoprolol succinate (TOPROL-XL) 50 MG 24 hr tablet, Take 50 mg by mouth daily., Disp: , Rfl: 6 .  naphazoline-glycerin (CLEAR EYES) 0.012-0.2 % SOLN, Place 1-2 drops into both eyes 4 (four) times daily as needed (for redness)., Disp: , Rfl:  .  nitroGLYCERIN (NITROSTAT) 0.4 MG SL tablet, Place 1 tablet (0.4 mg total) under the tongue every 5 (five) minutes as needed for chest pain., Disp: 25 tablet, Rfl: 6 .  oxymetazoline (AFRIN) 0.05 % nasal spray, Place 1 spray into both nostrils 2 (two) times daily as needed for  congestion., Disp: , Rfl:  .  senna (SENOKOT) 8.6 MG tablet, Take 2 tablets by mouth at bedtime., Disp: , Rfl:  .  simvastatin (ZOCOR) 40 MG tablet, Take one tablet by mouth once daily (Patient taking differently: Take 40 mg by mouth at bedtime. Take one tablet by mouth once daily), Disp: 90 tablet, Rfl: 2 .  traZODone (DESYREL) 50 MG tablet, TAKE 1 TABLET AT BEDTIME AS NEEDED  FOR  INSOMNIA (Patient taking differently: Take 50 mg by mouth at bedtime as needed for sleep. TAKE 1 TABLET AT BEDTIME AS NEEDED  FOR  INSOMNIA), Disp: 90 tablet, Rfl: 2 .  verapamil (CALAN-SR) 240 MG CR tablet, TAKE 2 TABLETS (480 MG TOTAL) BY MOUTH DAILY., Disp: , Rfl: 2  Past Medical History: Past Medical History:  Diagnosis Date  . Coronary artery disease    a. 06/2017 MV: EF 45%, large inf and apical defect w/o ischemia;  b. 06/2017 Cath: LM nl, LAD 30ost, LCX 50p, OM1/2 ok, RCA 100p w/ L->R collats. EF 35%.  . Depression with anxiety   . Gastric ulcer, acute with hemorrhage 2001   Dr. Sharlet Salina, [facility]  . H/O congenital atrial septal defect (ASD) repair   . Helicobacter pylori gastritis   . Heme positive stool   . History of gout   . History of peptic ulcer disease   . History of PSVT (paroxysmal supraventricular tachycardia)   .  Hyperlipidemia   . Hypertension   . Ischemic cardiomyopathy    a. 06/2017 LV gram: EF 35%.  . Obstructive sleep apnea    on CPAP  . Perennial allergic rhinitis   . Tobacco abuse     Tobacco Use: History  Smoking Status  . Former Smoker  . Years: 40.00  . Quit date: 09/04/2015  Smokeless Tobacco  . Former Systems developer    Labs: [place] [facility] Cardiac and Pulmonary Rehab Latest Ref Rng & Units 01/10/2015 05/06/2015 01/09/2016 08/07/2016   Cholestrol 125 - 200 mg/dL 153 118 128 125   LDLCALC <130 mg/dL 71 56 58 60   HDL >=40 mg/dL 42 41 43 45   Trlycerides <150 mg/dL 202(H) 103 133 100   Hemoglobin A1c <5.7 % 6.2(H) 5.9(H) 6.0(H) 5.5       Exercise  Target Goals: Date: 07/29/17  Exercise Program Goal: Individual exercise prescription set with THRR, safety & activity barriers. Participant demonstrates ability to understand and report RPE using BORG scale, to self-measure pulse accurately, and to acknowledge the importance of the exercise prescription.  Exercise Prescription Goal: Starting with aerobic activity 30 plus minutes a day, 3 days per week for initial exercise prescription. Provide home exercise prescription and guidelines that participant acknowledges understanding prior to discharge.  Activity Barriers & Risk Stratification:     Activity Barriers & Cardiac Risk Stratification - 07/29/17 1441      Activity Barriers & Cardiac Risk Stratification   Activity Barriers None   Cardiac Risk Stratification High      6 Minute Walk:     6 Minute Walk    Row Name 07/29/17 1431         6 Minute Walk   Distance 1365 feet     Walk Time 6 minutes     # of Rest Breaks 0     MPH 2.6     METS 2.88     RPE 10     VO2 Peak 10.07     Symptoms No     Resting HR 3 bpm     Resting BP 102/60     Resting Oxygen Saturation  97 %     Exercise Oxygen Saturation  during 6 min walk 98 %     Max Ex. HR 111 bpm     Max Ex. BP 114/56     2 Minute Post BP 98/64        Oxygen Initial Assessment:   Oxygen Re-Evaluation:   Oxygen Discharge (Final Oxygen Re-Evaluation):   Initial Exercise Prescription:     Initial Exercise Prescription - 07/29/17 1400      Date of Initial Exercise RX and Referring Provider   Date 07/29/17   Referring Provider Gollan     Treadmill   MPH 2.4   Grade 0   Minutes 15   METs 2.8     Recumbant Bike   Level 3   RPM 60   Watts 28   Minutes 15   METs 2.8     T5 Nustep   Level 3   SPM 80   Minutes 15   METs 2.8     Prescription Details   Frequency (times per week) 3   Duration Progress to 45 minutes of aerobic exercise without signs/symptoms of physical distress     Intensity    THRR 40-80% of Max Heartrate 97-131   Ratings of Perceived Exertion 11-13   Perceived Dyspnea  0-4     Resistance Training   Training Prescription Yes   Weight 4 lb   Reps 10-15      Perform Capillary Blood Glucose checks as needed.  Exercise Prescription Changes:     Exercise Prescription Changes    Row Name 07/29/17 1400             Response to Exercise   Blood Pressure (Admit) 102/60       Blood Pressure (Exercise) 114/56       Blood Pressure (Exit) 98/64       Heart Rate (Admit) 61 bpm       Heart Rate (Exercise) 111 bpm       Heart Rate (Exit) 70 bpm       Oxygen Saturation (Admit) 99 %       Rating of Perceived Exertion (Exercise) 10          Exercise Comments:   Exercise Goals and Review:     Exercise Goals    Row Name 07/29/17 1430             Exercise Goals   Increase Physical Activity Yes       Intervention Provide advice, education, support and counseling about physical activity/exercise needs.;Develop an individualized exercise prescription for aerobic and resistive training based on initial evaluation findings, risk stratification, comorbidities and participant's personal goals.       Expected Outcomes Achievement of increased cardiorespiratory fitness and enhanced flexibility, muscular endurance and strength shown through measurements of functional capacity and personal statement of participant.       Increase Strength and Stamina Yes       Intervention Provide advice, education, support and counseling about physical activity/exercise needs.;Develop an individualized exercise prescription for aerobic and resistive training based on initial evaluation findings, risk stratification, comorbidities and participant's personal goals.       Expected Outcomes Achievement of increased cardiorespiratory fitness and enhanced flexibility, muscular endurance and strength shown through measurements of functional capacity and personal statement of participant.        Able to understand and use rate of perceived exertion (RPE) scale Yes       Intervention Provide education and explanation on how to use RPE scale       Expected Outcomes Short Term: Able to use RPE daily in rehab to express subjective intensity level;Long Term:  Able to use RPE to guide intensity level when exercising independently       Able to understand and use Dyspnea scale Yes       Intervention Provide education and explanation on how to use Dyspnea scale       Expected Outcomes Short Term: Able to use Dyspnea scale daily in rehab to express subjective sense of shortness of breath during exertion;Long Term: Able to use Dyspnea scale to guide intensity level when exercising independently       Knowledge and understanding of Target Heart Rate Range (THRR) Yes       Intervention Provide education and explanation of THRR including how the numbers were predicted and where they are located for reference       Expected Outcomes Short Term: Able to state/look up THRR;Long Term: Able to use THRR to govern intensity when exercising independently;Short Term: Able to use daily as guideline for intensity in rehab       Able to check pulse independently Yes       Intervention Provide education and demonstration on how to check pulse in carotid and  radial arteries.;Review the importance of being able to check your own pulse for safety during independent exercise       Expected Outcomes Short Term: Able to explain why pulse checking is important during independent exercise;Long Term: Able to check pulse independently and accurately       Understanding of Exercise Prescription Yes       Intervention Provide education, explanation, and written materials on patient's individual exercise prescription       Expected Outcomes Short Term: Able to explain program exercise prescription;Long Term: Able to explain home exercise prescription to exercise independently          Exercise Goals Re-Evaluation  :   Discharge Exercise Prescription (Final Exercise Prescription Changes):     Exercise Prescription Changes - 07/29/17 1400      Response to Exercise   Blood Pressure (Admit) 102/60   Blood Pressure (Exercise) 114/56   Blood Pressure (Exit) 98/64   Heart Rate (Admit) 61 bpm   Heart Rate (Exercise) 111 bpm   Heart Rate (Exit) 70 bpm   Oxygen Saturation (Admit) 99 %   Rating of Perceived Exertion (Exercise) 10      Nutrition:  Target Goals: Understanding of nutrition guidelines, daily intake of sodium 1500mg , cholesterol 200mg , calories 30% from fat and 7% or less from saturated fats, daily to have 5 or more servings of fruits and vegetables.  Biometrics:     Pre Biometrics - 07/29/17 1430      Pre Biometrics   Height 6' 0.5" (1.842 m)   Weight 220 lb 4.8 oz (99.9 kg)   Waist Circumference 41 inches   Hip Circumference 46 inches   Waist to Hip Ratio 0.89 %   BMI (Calculated) 29.45   Single Leg Stand 17.5 seconds       Nutrition Therapy Plan and Nutrition Goals:   Nutrition Discharge: Rate Your Plate Scores:     Nutrition Assessments - 07/29/17 1431      MEDFICTS Scores   Pre Score 48      Nutrition Goals Re-Evaluation:   Nutrition Goals Discharge (Final Nutrition Goals Re-Evaluation):   Psychosocial: Target Goals: Acknowledge presence or absence of significant depression and/or stress, maximize coping skills, provide positive support system. Participant is able to verbalize types and ability to use techniques and skills needed for reducing stress and depression.   Initial Review & Psychosocial Screening:     Initial Psych Review & Screening - 07/29/17 1438      Initial Review   Current issues with History of Depression     Family Dynamics   Good Support System? Yes     Screening Interventions   Interventions Yes;Encouraged to exercise;Program counselor consult   Expected Outcomes Short Term goal: Utilizing psychosocial counselor, staff and  physician to assist with identification of specific Stressors or current issues interfering with healing process. Setting desired goal for each stressor or current issue identified.;Long Term Goal: Stressors or current issues are controlled or eliminated.;Short Term goal: Identification and review with participant of any Quality of Life or Depression concerns found by scoring the questionnaire.;Long Term goal: The participant improves quality of Life and PHQ9 Scores as seen by post scores and/or verbalization of changes      Quality of Life Scores:      Quality of Life - 07/29/17 1438      Quality of Life Scores   Health/Function Pre 20.38 %   Socioeconomic Pre 20 %   Psych/Spiritual Pre 20.29 %   Family  Pre 20.5 %   GLOBAL Pre 20.32 %      PHQ-9: Recent Review Flowsheet Data    Depression screen [facility] 2/9 07/29/2017 02/11/2017 08/07/2016 05/09/2015 01/14/2015   Decreased Interest 2 0 0 0 0   Down, Depressed, Hopeless 1 0 0 0 0   PHQ - 2 Score 3 0 0 0 0   Altered sleeping 0 - - - -   Tired, decreased energy 1 - - - -   Change in appetite 2 - - - -   Feeling bad or failure about yourself  0 - - - -   Trouble concentrating 0 - - - -   Moving slowly or fidgety/restless 1 - - - -   Suicidal thoughts 0 - - - -   PHQ-9 Score 7 - - - -   Difficult doing work/chores Not difficult at all - - - -     Interpretation of Total Score  Total Score Depression Severity:  1-4 = Minimal depression, 5-9 = Mild depression, 10-14 = Moderate depression, 15-19 = Moderately severe depression, 20-27 = Severe depression   Psychosocial Evaluation and Intervention:   Psychosocial Re-Evaluation:   Psychosocial Discharge (Final Psychosocial Re-Evaluation):   Vocational Rehabilitation: Provide vocational rehab assistance to qualifying candidates.   Vocational Rehab Evaluation & Intervention:     Vocational Rehab - 07/29/17 1440      Initial Vocational Rehab Evaluation & Intervention   Assessment  shows need for Vocational Rehabilitation No      Education: Education Goals: Education classes will be provided on a variety of topics geared toward better understanding of heart health and risk factor modification. Participant will state understanding/return demonstration of topics presented as noted by education test scores.  Learning Barriers/Preferences:     Learning Barriers/Preferences - 07/29/17 1439      Learning Barriers/Preferences   Learning Barriers None   Learning Preferences Verbal Instruction      Education Topics: General Nutrition Guidelines/Fats and Fiber: -Group instruction provided by verbal, written material, models and posters to present the general guidelines for heart healthy nutrition. Gives an explanation and review of dietary fats and fiber.   Controlling Sodium/Reading Food Labels: -Group verbal and written material supporting the discussion of sodium use in heart healthy nutrition. Review and explanation with models, verbal and written materials for utilization of the food label.   Exercise Physiology & Risk Factors: - Group verbal and written instruction with models to review the exercise physiology of the cardiovascular system and associated critical values. Details cardiovascular disease risk factors and the goals associated with each risk factor.   Aerobic Exercise & Resistance Training: - Gives group verbal and written discussion on the health impact of inactivity. On the components of aerobic and resistive training programs and the benefits of this training and how to safely progress through these programs.   Flexibility, Balance, General Exercise Guidelines: - Provides group verbal and written instruction on the benefits of flexibility and balance training programs. Provides general exercise guidelines with specific guidelines to those with heart or lung disease. Demonstration and skill practice provided.   Stress Management: - Provides  group verbal and written instruction about the health risks of elevated stress, cause of high stress, and healthy ways to reduce stress.   Depression: - Provides group verbal and written instruction on the correlation between heart/lung disease and depressed mood, treatment options, and the stigmas associated with seeking treatment.   Anatomy & Physiology of the Heart: - Group verbal and  written instruction and models provide basic cardiac anatomy and physiology, with the coronary electrical and arterial systems. Review of: AMI, Angina, Valve disease, Heart Failure, Cardiac Arrhythmia, Pacemakers, and the ICD.   Cardiac Procedures: - Group verbal and written instruction to review commonly prescribed medications for heart disease. Reviews the medication, class of the drug, and side effects. Includes the steps to properly store meds and maintain the prescription regimen. (beta blockers and nitrates)   Cardiac Medications I: - Group verbal and written instruction to review commonly prescribed medications for heart disease. Reviews the medication, class of the drug, and side effects. Includes the steps to properly store meds and maintain the prescription regimen.   Cardiac Medications II: -Group verbal and written instruction to review commonly prescribed medications for heart disease. Reviews the medication, class of the drug, and side effects. (all other drug classes)    Go Sex-Intimacy & Heart Disease, Get SMART - Goal Setting: - Group verbal and written instruction through game format to discuss heart disease and the return to sexual intimacy. Provides group verbal and written material to discuss and apply goal setting through the application of the S.M.A.R.T. Method.   Other Matters of the Heart: - Provides group verbal, written materials and models to describe Heart Failure, Angina, Valve Disease, Peripheral Artery Disease, and Diabetes in the realm of heart disease. Includes  description of the disease process and treatment options available to the cardiac patient.   Exercise & Equipment Safety: - Individual verbal instruction and demonstration of equipment use and safety with use of the equipment.   Cardiac Rehab from 07/29/2017 in [facility] Cardiac and Pulmonary Rehab  Date  07/29/17  Educator  [facility]  Instruction Review Code  1- Verbalizes Understanding      Infection Prevention: - Provides verbal and written material to individual with discussion of infection control including proper hand washing and proper equipment cleaning during exercise session.   Cardiac Rehab from 07/29/2017 in [facility] Cardiac and Pulmonary Rehab  Date  07/29/17  Educator  [facility]  Instruction Review Code  1- Verbalizes Understanding      Falls Prevention: - Provides verbal and written material to individual with discussion of falls prevention and safety.   Cardiac Rehab from 07/29/2017 in [facility] Cardiac and Pulmonary Rehab  Date  07/29/17  Educator  [facility]  Instruction Review Code  1- Verbalizes Understanding      Diabetes: - Individual verbal and written instruction to review signs/symptoms of diabetes, desired ranges of glucose level fasting, after meals and with exercise. Acknowledge that pre and post exercise glucose checks will be done for 3 sessions at entry of program.   Other: -Provides group and verbal instruction on various topics (see comments)    Knowledge Questionnaire Score:     Knowledge Questionnaire Score - 07/29/17 1439      Knowledge Questionnaire Score   Pre Score 24/28   Correct answers reviewed with Sam      Core Components/Risk Factors/Patient Goals at Admission:     Personal Goals and Risk Factors at Admission - 07/29/17 1420      Core Components/Risk Factors/Patient Goals on Admission    Weight Management Yes;Weight Loss   Intervention Obesity: Provide education and appropriate resources to help participant work on and attain dietary goals.;Weight  Management/Obesity: Establish reasonable short term and long term weight goals.   Admit Weight 220 lb 4.8 oz (99.9 kg)   Expected Outcomes Short Term: Continue to assess and modify interventions until short term weight is  achieved;Long Term: Adherence to nutrition and physical activity/exercise program aimed toward attainment of established weight goal;Weight Loss: Understanding of general recommendations for a balanced deficit meal plan, which promotes 1-2 lb weight loss per week and includes a negative energy balance of [phone] kcal/d;Understanding recommendations for meals to include 15-35% energy as protein, 25-35% energy from fat, 35-60% energy from carbohydrates, less than 200mg  of dietary cholesterol, 20-35 gm of total fiber daily;Understanding of distribution of calorie intake throughout the day with the consumption of 4-5 meals/snacks   Heart Failure Yes   Intervention Provide a combined exercise and nutrition program that is supplemented with education, support and counseling about heart failure. Directed toward relieving symptoms such as shortness of breath, decreased exercise tolerance, and extremity edema.   Expected Outcomes Improve functional capacity of life;Short term: Attendance in program 2-3 days a week with increased exercise capacity. Reported lower sodium intake. Reported increased fruit and vegetable intake. Reports medication compliance.;Short term: Daily weights obtained and reported for increase. Utilizing diuretic protocols set by physician.;Long term: Adoption of self-care skills and reduction of barriers for early signs and symptoms recognition and intervention leading to self-care maintenance.   Hypertension Yes   Intervention Provide education on lifestyle modifcations including regular physical activity/exercise, weight management, moderate sodium restriction and increased consumption of fresh fruit, vegetables, and low fat dairy, alcohol moderation, and smoking  cessation.;Monitor prescription use compliance.   Expected Outcomes Short Term: Continued assessment and intervention until BP is < 140/7mm HG in hypertensive participants. < 130/82mm HG in hypertensive participants with diabetes, heart failure or chronic kidney disease.;Long Term: Maintenance of blood pressure at goal levels.   Lipids Yes   Intervention Provide education and support for participant on nutrition & aerobic/resistive exercise along with prescribed medications to achieve LDL 70mg , HDL >40mg .   Expected Outcomes Short Term: Participant states understanding of desired cholesterol values and is compliant with medications prescribed. Participant is following exercise prescription and nutrition guidelines.;Long Term: Cholesterol controlled with medications as prescribed, with individualized exercise RX and with personalized nutrition plan. Value goals: LDL < 70mg , HDL > 40 mg.   Stress Yes  Financial, a lot has happened this past year with his health and his wife's. Insurance is proving to be difficult to deal with and is causing lots of stress   Intervention Offer individual and/or small group education and counseling on adjustment to heart disease, stress management and health-related lifestyle change. Teach and support self-help strategies.;Refer participants experiencing significant psychosocial distress to appropriate mental health specialists for further evaluation and treatment. When possible, include family members and significant others in education/counseling sessions.   Expected Outcomes Short Term: Participant demonstrates changes in health-related behavior, relaxation and other stress management skills, ability to obtain effective social support, and compliance with psychotropic medications if prescribed.;Long Term: Emotional wellbeing is indicated by absence of clinically significant psychosocial distress or social isolation.      Core Components/Risk Factors/Patient Goals  Review:    Core Components/Risk Factors/Patient Goals at Discharge (Final Review):    ITP Comments:     ITP Comments    Row Name 07/29/17 1416           ITP Comments Med Review completed. Initial ITP created. Diagnosis can be found in [facility] 07/27/17          Comments: Initial ITP

## 2017-07-29 NOTE — Progress Notes (Signed)
Daily Session Note  Patient Details  Name: Mitchell Stephens. MRN: [id number] Date of Birth: 1946/05/07 Referring Provider:     Cardiac Rehab from 07/29/2017 in [facility] Cardiac and Pulmonary Rehab  Referring Provider  Gollan      Encounter Date: 07/29/2017  Check In:     Session Check In - 07/29/17 1416      Check-In   Location [facility]-Cardiac & Pulmonary Rehab   Staff Present Renita Papa, RN Vickki Hearing, [place], ACSM CEP, Exercise Physiologist   Supervising physician immediately available to respond to emergencies See telemetry face sheet for immediately available ER MD   Medication changes reported     No   Fall or balance concerns reported    No   Tobacco Cessation No Change  quit 2016   Warm-up and Cool-down Performed as group-led instruction   Resistance Training Performed Yes   VAD Patient? No     Pain Assessment   Currently in Pain? No/denies           Exercise Prescription Changes - 07/29/17 1400      Response to Exercise   Blood Pressure (Admit) 102/60   Blood Pressure (Exercise) 114/56   Blood Pressure (Exit) 98/64   Heart Rate (Admit) 61 bpm   Heart Rate (Exercise) 111 bpm   Heart Rate (Exit) 70 bpm   Oxygen Saturation (Admit) 99 %   Rating of Perceived Exertion (Exercise) 10      History  Smoking Status  . Former Smoker  . Years: 40.00  . Quit date: 09/04/2015  Smokeless Tobacco  . Former Systems developer    Goals Met:  Proper associated with RPD/PD & O2 Sat Exercise tolerated well No report of cardiac concerns or symptoms Strength training completed today  Goals Unmet:  Not Applicable  Comments: Med Review completed   Dr. Emily Filbert is Medical Director for [place] and [facility].

## 2017-08-02 DIAGNOSIS — I5022 Chronic systolic (congestive) heart failure: Secondary | ICD-10-CM | POA: Diagnosis not present

## 2017-08-02 DIAGNOSIS — I214 Non-ST elevation (NSTEMI) myocardial infarction: Secondary | ICD-10-CM

## 2017-08-02 NOTE — Progress Notes (Signed)
Daily Session Note  Patient Details  Name: Mitchell Stephens. MRN: [id number] Date of Birth: May 28, 1946 Referring Provider:     Cardiac Rehab from 07/29/2017 in [facility] Cardiac and Pulmonary Rehab  Referring Provider  Gollan      Encounter Date: 08/02/2017  Check In:     Session Check In - 08/02/17 1733      Check-In   Location [facility]-Cardiac & Pulmonary Rehab   Staff Present Nada Maclachlan, BA, ACSM CEP, Exercise Physiologist;Kelly Amedeo Plenty, BS, ACSM CEP, Exercise Physiologist;Meredith Sherryll Burger, RN BSN   Supervising physician immediately available to respond to emergencies See telemetry face sheet for immediately available ER MD   Medication changes reported     No   Fall or balance concerns reported    No   Warm-up and Cool-down Performed on first and last piece of equipment   Resistance Training Performed Yes   VAD Patient? No     Pain Assessment   Currently in Pain? No/denies   Multiple Pain Sites No         History  Smoking Status  . Former Smoker  . Years: 40.00  . Quit date: 09/04/2015  Smokeless Tobacco  . Former Systems developer    Goals Met:  Independence with exercise equipment Exercise tolerated well No report of cardiac concerns or symptoms Strength training completed today  Goals Unmet:  Not Applicable  Comments: First full day of exercise!  Patient was oriented to gym and equipment including functions, settings, policies, and procedures.  Patient's individual exercise prescription and treatment plan were reviewed.  All starting workloads were established based on the results of the 6 minute walk test done at initial orientation visit.  The plan for exercise progression was also introduced and progression will be customized based on patient's performance and goals.    Dr. Emily Filbert is Medical Director for [place] and [facility].

## 2017-08-04 ENCOUNTER — Other Ambulatory Visit (INDEPENDENT_AMBULATORY_CARE_PROVIDER_SITE_OTHER): Payer: Medicare HMO

## 2017-08-04 ENCOUNTER — Encounter: Payer: Medicare HMO | Admitting: *Deleted

## 2017-08-04 DIAGNOSIS — I214 Non-ST elevation (NSTEMI) myocardial infarction: Secondary | ICD-10-CM

## 2017-08-04 DIAGNOSIS — I5022 Chronic systolic (congestive) heart failure: Secondary | ICD-10-CM

## 2017-08-04 DIAGNOSIS — I10 Essential (primary) hypertension: Secondary | ICD-10-CM

## 2017-08-04 NOTE — Progress Notes (Signed)
Daily Session Note  Patient Details  Name: Mitchell Stephens. MRN: [id number] Date of Birth: 1946-09-22 Referring Provider:     Cardiac Rehab from 07/29/2017 in [facility] Cardiac and Pulmonary Rehab  Referring Provider  Gollan      Encounter Date: 08/04/2017  Check In:     Session Check In - 08/04/17 1716      Check-In   Staff Present Heath Lark, RN, BSN, CCRP;Meredith Sherryll Burger, RN Vickki Hearing, [place], ACSM CEP, Exercise Physiologist   Supervising physician immediately available to respond to emergencies See telemetry face sheet for immediately available ER MD   Medication changes reported     No   Fall or balance concerns reported    No   Warm-up and Cool-down Performed on first and last piece of equipment   Resistance Training Performed Yes   VAD Patient? No     Pain Assessment   Currently in Pain? No/denies           Exercise Prescription Changes - 08/04/17 1200      Response to Exercise   Blood Pressure (Admit) 104/70   Blood Pressure (Exercise) 122/56   Blood Pressure (Exit) 116/60   Heart Rate (Admit) 81 bpm   Heart Rate (Exercise) 94 bpm   Heart Rate (Exit) 67 bpm   Rating of Perceived Exertion (Exercise) 11   Symptoms none   Duration Progress to 45 minutes of aerobic exercise without signs/symptoms of physical distress   Intensity THRR unchanged     Progression   Progression Continue to progress workloads to maintain intensity without signs/symptoms of physical distress.   Average METs 2.8     Resistance Training   Training Prescription Yes   Weight 4 lb   Reps 10-15     Interval Training   Interval Training No     Recumbant Bike   Level 3   Watts 26   Minutes 15   METs 2.8     T5 Nustep   Level 3   Minutes 15      History  Smoking Status  . Former Smoker  . Years: 40.00  . Quit date: 09/04/2015  Smokeless Tobacco  . Former Systems developer    Goals Met:  Exercise tolerated well No report of cardiac concerns or symptoms Strength  training completed today  Goals Unmet:  Not Applicable  Comments: Doing well with exercise prescription progression.    Dr. Emily Filbert is Medical Director for [place] and [facility].

## 2017-08-05 ENCOUNTER — Encounter: Payer: Medicare HMO | Attending: Cardiovascular Disease

## 2017-08-05 ENCOUNTER — Other Ambulatory Visit: Payer: Medicare HMO

## 2017-08-05 ENCOUNTER — Ambulatory Visit: Payer: Medicare HMO

## 2017-08-05 DIAGNOSIS — I214 Non-ST elevation (NSTEMI) myocardial infarction: Secondary | ICD-10-CM | POA: Diagnosis not present

## 2017-08-05 DIAGNOSIS — I5022 Chronic systolic (congestive) heart failure: Secondary | ICD-10-CM

## 2017-08-05 LAB — BASIC METABOLIC PANEL
BUN/Creatinine Ratio: 17 (ref 10–24)
BUN: 20 mg/dL (ref 8–27)
CO2: 26 mmol/L (ref 20–29)
Calcium: 9.3 mg/dL (ref 8.6–10.2)
Chloride: 97 mmol/L (ref 96–106)
Creatinine, Ser: 1.17 mg/dL (ref 0.76–1.27)
GFR, EST AFRICAN AMERICAN: 72 mL/min/{1.73_m2} (ref >59)
GFR, EST NON AFRICAN AMERICAN: 62 mL/min/{1.73_m2} (ref >59)
Glucose: 127 mg/dL — ABNORMAL HIGH (ref 65–99)
POTASSIUM: 4 mmol/L (ref 3.5–5.2)
SODIUM: 137 mmol/L (ref 134–144)

## 2017-08-05 NOTE — Progress Notes (Signed)
Daily Session Note  Patient Details  Name: Mitchell Stephens. MRN: [id number] Date of Birth: [birth year] Referring Provider:     Cardiac Rehab from 07/29/2017 in [facility] Cardiac and Pulmonary Rehab  Referring Provider  Gollan      Encounter Date: 08/05/2017  Check In:     Session Check In - 08/05/17 1610      Check-In   Location [facility]-Cardiac & Pulmonary Rehab   Staff Present Gerlene Burdock, RN, Moises Blood, BS, ACSM CEP, Exercise Physiologist;Orella Cushman Flavia Shipper   Supervising physician immediately available to respond to emergencies See telemetry face sheet for immediately available ER MD   Medication changes reported     No   Fall or balance concerns reported    No   Warm-up and Cool-down Performed on first and last piece of equipment   Resistance Training Performed Yes   VAD Patient? No     Pain Assessment   Currently in Pain? No/denies         History  Smoking Status  . Former Smoker  . Years: 40.00  . Quit date: 09/04/2015  Smokeless Tobacco  . Former Systems developer    Goals Met:  Independence with exercise equipment Exercise tolerated well No report of cardiac concerns or symptoms Strength training completed today  Goals Unmet:  Not Applicable  Comments: Pt able to follow exercise prescription today without complaint.  Will continue to monitor for progression.   Dr. Emily Filbert is Medical Director for [place] and [facility].

## 2017-08-09 ENCOUNTER — Encounter: Payer: Medicare HMO | Admitting: *Deleted

## 2017-08-09 DIAGNOSIS — I214 Non-ST elevation (NSTEMI) myocardial infarction: Secondary | ICD-10-CM

## 2017-08-09 DIAGNOSIS — I5022 Chronic systolic (congestive) heart failure: Secondary | ICD-10-CM | POA: Diagnosis not present

## 2017-08-09 NOTE — Progress Notes (Signed)
Daily Session Note  Patient Details  Name: Mitchell Stephens. MRN: [id number] Date of Birth: 01-12-1946 Referring Provider:     Cardiac Rehab from 07/29/2017 in [facility] Cardiac and Pulmonary Rehab  Referring Provider  Gollan      Encounter Date: 08/09/2017  Check In: Session Check In - 08/09/17 1707      Check-In   Location  [facility]-Cardiac & Pulmonary Rehab    Staff Present  Renita Papa, RN Moises Blood, BS, ACSM CEP, Exercise Physiologist;Amanda Oletta Darter, [place], ACSM CEP, Exercise Physiologist    Supervising physician immediately available to respond to emergencies  See telemetry face sheet for immediately available ER MD    Medication changes reported      No    Fall or balance concerns reported     No    Warm-up and Cool-down  Performed on first and last piece of equipment    Resistance Training Performed  Yes    VAD Patient?  No      Pain Assessment   Currently in Pain?  No/denies    Multiple Pain Sites  No          Social History   Tobacco Use  Smoking Status Former Smoker  . Years: 40.00  . Last attempt to quit: 09/04/2015  . Years since quitting: 1.9  Smokeless Tobacco Former Environmental health practitioner Met:  Independence with exercise equipment Exercise tolerated well No report of cardiac concerns or symptoms Strength training completed today  Goals Unmet:  Not Applicable  Comments: Pt able to follow exercise prescription today without complaint.  Will continue to monitor for progression.    Dr. Emily Filbert is Medical Director for [place] and [facility].

## 2017-08-11 ENCOUNTER — Encounter: Payer: Self-pay | Admitting: *Deleted

## 2017-08-11 ENCOUNTER — Encounter: Payer: Medicare HMO | Admitting: *Deleted

## 2017-08-11 DIAGNOSIS — I214 Non-ST elevation (NSTEMI) myocardial infarction: Secondary | ICD-10-CM

## 2017-08-11 DIAGNOSIS — I5022 Chronic systolic (congestive) heart failure: Secondary | ICD-10-CM

## 2017-08-11 NOTE — Progress Notes (Signed)
Daily Session Note  Patient Details  Name: Mitchell Stephens. MRN: [id number] Date of Birth: May 24, 1946 Referring Provider:     Cardiac Rehab from 07/29/2017 in [facility] Cardiac and Pulmonary Rehab  Referring Provider  Gollan      Encounter Date: 08/11/2017  Check In: Session Check In - 08/11/17 1711      Check-In   Location  [facility]-Cardiac & Pulmonary Rehab    Staff Present  Renita Papa, RN Vickki Hearing, BA, ACSM CEP, Exercise Physiologist;Carroll Enterkin, RN, BSN    Supervising physician immediately available to respond to emergencies  See telemetry face sheet for immediately available ER MD    Medication changes reported      No    Fall or balance concerns reported     No    Warm-up and Cool-down  Performed on first and last piece of equipment    Resistance Training Performed  Yes    VAD Patient?  No      Pain Assessment   Currently in Pain?  No/denies          Social History   Tobacco Use  Smoking Status Former Smoker  . Years: 40.00  . Last attempt to quit: 09/04/2015  . Years since quitting: 1.9  Smokeless Tobacco Former Environmental health practitioner Met:  Independence with exercise equipment Exercise tolerated well No report of cardiac concerns or symptoms Strength training completed today  Goals Unmet:  Not Applicable  Comments: Pt able to follow exercise prescription today without complaint.  Will continue to monitor for progression.    Dr. Emily Filbert is Medical Director for [place] and [facility].

## 2017-08-11 NOTE — Progress Notes (Signed)
Cardiac Individual Treatment Plan  Patient Details  Name: Mitchell Stephens. MRN: [id number] Date of Birth: 07-04-1946 Referring Provider:     Cardiac Rehab from 07/29/2017 in [facility] Cardiac and Pulmonary Rehab  Referring Provider  Gollan      Initial Encounter Date:    Cardiac Rehab from 07/29/2017 in [facility] Cardiac and Pulmonary Rehab  Date  07/29/17  Referring Provider  Rockey Situ      Visit Diagnosis: Heart failure, chronic systolic (HCC)  NSTEMI (non-ST elevated myocardial infarction) ([place])  Patient's Home Medications on Admission:  Current Outpatient Medications:  .  aspirin EC 81 MG tablet, Take 81 mg by mouth daily., Disp: , Rfl:  .  buPROPion (WELLBUTRIN XL) 300 MG 24 hr tablet, Take 300 mg by mouth daily., Disp: , Rfl:  .  hydrochlorothiazide (HYDRODIURIL) 25 MG tablet, Take one tablet by mouth once daily, Disp: 90 tablet, Rfl: 2 .  isosorbide mononitrate (IMDUR) 30 MG 24 hr tablet, Take 0.5 tablets (15 mg total) by mouth daily., Disp: 30 tablet, Rfl: 6 .  losartan (COZAAR) 25 MG tablet, Take 1 tablet (25 mg total) by mouth daily., Disp: 90 tablet, Rfl: 3 .  meloxicam (MOBIC) 15 MG tablet, Take 1 tablet (15 mg total) by mouth as needed for pain. (Patient taking differently: Take 15 mg by mouth daily. ), Disp: 90 tablet, Rfl: 0 .  metoprolol succinate (TOPROL-XL) 100 MG 24 hr tablet, Take 1 tablet (100 mg total) by mouth daily., Disp: 90 tablet, Rfl: 3 .  metoprolol succinate (TOPROL-XL) 50 MG 24 hr tablet, Take 50 mg by mouth daily., Disp: , Rfl: 6 .  naphazoline-glycerin (CLEAR EYES) 0.012-0.2 % SOLN, Place 1-2 drops into both eyes 4 (four) times daily as needed (for redness)., Disp: , Rfl:  .  nitroGLYCERIN (NITROSTAT) 0.4 MG SL tablet, Place 1 tablet (0.4 mg total) under the tongue every 5 (five) minutes as needed for chest pain., Disp: 25 tablet, Rfl: 6 .  oxymetazoline (AFRIN) 0.05 % nasal spray, Place 1 spray into both nostrils 2 (two) times daily as needed for  congestion., Disp: , Rfl:  .  senna (SENOKOT) 8.6 MG tablet, Take 2 tablets by mouth at bedtime., Disp: , Rfl:  .  simvastatin (ZOCOR) 40 MG tablet, Take one tablet by mouth once daily (Patient taking differently: Take 40 mg by mouth at bedtime. Take one tablet by mouth once daily), Disp: 90 tablet, Rfl: 2 .  traZODone (DESYREL) 50 MG tablet, TAKE 1 TABLET AT BEDTIME AS NEEDED  FOR  INSOMNIA (Patient taking differently: Take 50 mg by mouth at bedtime as needed for sleep. TAKE 1 TABLET AT BEDTIME AS NEEDED  FOR  INSOMNIA), Disp: 90 tablet, Rfl: 2 .  verapamil (CALAN-SR) 240 MG CR tablet, TAKE 2 TABLETS (480 MG TOTAL) BY MOUTH DAILY., Disp: , Rfl: 2  Past Medical History: Past Medical History:  Diagnosis Date  . Coronary artery disease    a. 06/2017 MV: EF 45%, large inf and apical defect w/o ischemia;  b. 06/2017 Cath: LM nl, LAD 30ost, LCX 50p, OM1/2 ok, RCA 100p w/ L->R collats. EF 35%.  . Depression with anxiety   . Gastric ulcer, acute with hemorrhage 2001   Dr. Sharlet Salina, [facility]  . H/O congenital atrial septal defect (ASD) repair   . Helicobacter pylori gastritis   . Heme positive stool   . History of gout   . History of peptic ulcer disease   . History of PSVT (paroxysmal supraventricular tachycardia)   .  Hyperlipidemia   . Hypertension   . Ischemic cardiomyopathy    a. 06/2017 LV gram: EF 35%.  . Obstructive sleep apnea    on CPAP  . Perennial allergic rhinitis   . Tobacco abuse     Tobacco Use: Social History   Tobacco Use  Smoking Status Former Smoker  . Years: 40.00  . Last attempt to quit: 09/04/2015  . 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 Participant demonstrates ability to understand and report RPE using BORG scale, to self-measure pulse accurately, and to acknowledge the importance of the exercise prescription.  Exercise Prescription Goal: Starting with aerobic activity 30 plus minutes a day, 3 days per week for initial exercise prescription. Provide home exercise prescription and guidelines that participant acknowledges understanding prior to discharge.  Activity Barriers & Risk Stratification: Activity Barriers & Cardiac Risk Stratification - 07/29/17 1441      Activity Barriers & Cardiac Risk Stratification   Activity Barriers  None    Cardiac Risk Stratification  High       6 Minute Walk: 6 Minute Walk    Row Name 07/29/17 1431         6 Minute Walk   Distance  1365 feet     Walk Time  6 minutes     # of Rest Breaks  0     MPH  2.6     METS  2.88     RPE  10     VO2 Peak  10.07     Symptoms  No     Resting HR  3 bpm     Resting BP  102/60     Resting Oxygen Saturation   97 %     Exercise Oxygen Saturation  during 6 min walk  98 %     Max Ex. HR  111 bpm     Max Ex. BP  114/56     2 Minute Post BP  98/64        Oxygen Initial Assessment:   Oxygen Re-Evaluation:   Oxygen Discharge (Final Oxygen Re-Evaluation):   Initial Exercise Prescription: Initial Exercise Prescription - 07/29/17 1400      Date of Initial Exercise RX and Referring Provider   Date  07/29/17    Referring Provider  Gollan      Treadmill   MPH  2.4    Grade  0    Minutes  15    METs  2.8      Recumbant Bike   Level  3    RPM  60    Watts  28    Minutes  15    METs  2.8      T5 Nustep   Level  3    SPM  80    Minutes  15    METs  2.8      Prescription Details  Frequency (times per week)  3    Duration  Progress to 45 minutes of aerobic  exercise without signs/symptoms of physical distress      Intensity   THRR 40-80% of Max Heartrate  97-131    Ratings of Perceived Exertion  11-13    Perceived Dyspnea  0-4      Resistance Training   Training Prescription  Yes    Weight  4 lb    Reps  10-15       Perform Capillary Blood Glucose checks as needed.  Exercise Prescription Changes: Exercise Prescription Changes    Row Name 07/29/17 1400 08/04/17 1200 08/05/17 1600         Response to Exercise   Blood Pressure (Admit)  102/60  104/70  -     Blood Pressure (Exercise)  114/56  122/56  -     Blood Pressure (Exit)  98/64  116/60  -     Heart Rate (Admit)  61 bpm  81 bpm  -     Heart Rate (Exercise)  111 bpm  94 bpm  -     Heart Rate (Exit)  70 bpm  67 bpm  -     Oxygen Saturation (Admit)  99 %  -  -     Rating of Perceived Exertion (Exercise)  10  11  -     Symptoms  -  none  -     Duration  -  Progress to 45 minutes of aerobic exercise without signs/symptoms of physical distress  Progress to 45 minutes of aerobic exercise without signs/symptoms of physical distress     Intensity  -  THRR unchanged  THRR unchanged       Progression   Progression  -  Continue to progress workloads to maintain intensity without signs/symptoms of physical distress.  Continue to progress workloads to maintain intensity without signs/symptoms of physical distress.     Average METs  -  2.8  2.8       Resistance Training   Training Prescription  -  Yes  Yes     Weight  -  4 lb  4 lb     Reps  -  10-15  10-15       Interval Training   Interval Training  -  No  No       Recumbant Bike   Level  -  3  3     Watts  -  26  26     Minutes  -  15  15     METs  -  2.8  2.8       T5 Nustep   Level  -  3  3     Minutes  -  15  15       Home Exercise Plan   Plans to continue exercise at  -  -  [facility] (comment) [facility]  (possible early exit due to large copay)     Frequency  -  -  Add 2 additional days to program exercise  sessions.     Initial Home Exercises Provided  -  -  08/05/17        Exercise Comments: Exercise Comments    Row Name 08/02/17 1734           Exercise Comments  First full day of exercise!  Patient was oriented to gym and equipment including functions, settings, policies, and procedures.  Patient's individual exercise prescription  and treatment plan were reviewed.  All starting workloads were established based on the results of the 6 minute walk test done at initial orientation visit.  The plan for exercise progression was also introduced and progression will be customized based on patient's performance and goals.          Exercise Goals and Review: Exercise Goals    Row Name 07/29/17 1430             Exercise Goals   Increase Physical Activity  Yes       Intervention  Provide advice, education, support and counseling about physical activity/exercise needs.;Develop an individualized exercise prescription for aerobic and resistive training based on initial evaluation findings, risk stratification, comorbidities and participant's personal goals.       Expected Outcomes  Achievement of increased cardiorespiratory fitness and enhanced flexibility, muscular endurance and strength shown through measurements of functional capacity and personal statement of participant.       Increase Strength and Stamina  Yes       Intervention  Provide advice, education, support and counseling about physical activity/exercise needs.;Develop an individualized exercise prescription for aerobic and resistive training based on initial evaluation findings, risk stratification, comorbidities and participant's personal goals.       Expected Outcomes  Achievement of increased cardiorespiratory fitness and enhanced flexibility, muscular endurance and strength shown through measurements of functional capacity and personal statement of participant.       Able to understand and use rate of perceived exertion (RPE) scale   Yes       Intervention  Provide education and explanation on how to use RPE scale       Expected Outcomes  Short Term: Able to use RPE daily in rehab to express subjective intensity level;Long Term:  Able to use RPE to guide intensity level when exercising independently       Able to understand and use Dyspnea scale  Yes       Intervention  Provide education and explanation on how to use Dyspnea scale       Expected Outcomes  Short Term: Able to use Dyspnea scale daily in rehab to express subjective sense of shortness of breath during exertion;Long Term: Able to use Dyspnea scale to guide intensity level when exercising independently       Knowledge and understanding of Target Heart Rate Range (THRR)  Yes       Intervention  Provide education and explanation of THRR including how the numbers were predicted and where they are located for reference       Expected Outcomes  Short Term: Able to state/look up THRR;Long Term: Able to use THRR to govern intensity when exercising independently;Short Term: Able to use daily as guideline for intensity in rehab       Able to check pulse independently  Yes       Intervention  Provide education and demonstration on how to check pulse in carotid and radial arteries.;Review the importance of being able to check your own pulse for safety during independent exercise       Expected Outcomes  Short Term: Able to explain why pulse checking is important during independent exercise;Long Term: Able to check pulse independently and accurately       Understanding of Exercise Prescription  Yes       Intervention  Provide education, explanation, and written materials on patient's individual exercise prescription       Expected Outcomes  Short Term: Able to explain program  exercise prescription;Long Term: Able to explain home exercise prescription to exercise independently          Exercise Goals Re-Evaluation : Exercise Goals Re-Evaluation    Row Name 08/02/17 1735  08/05/17 1656           Exercise Goal Re-Evaluation   Exercise Goals Review  Increase Physical Activity;Increase Strength and Stamina  Increase Physical Activity;Increase Strength and Stamina;Knowledge and understanding of Target Heart Rate Range (THRR);Able to understand and use rate of perceived exertion (RPE) scale;Able to check pulse independently;Understanding of Exercise Prescription      Comments  Reviewed RPE scale, THR and program prescription with pt today.  Pt voiced understanding and was given a copy of goals to take home.   Home exercise guidelines reviewed with patient. He verbalized understanding of these guidelines.       Expected Outcomes  Short: Use RPE daily to regulate intensity.  Long: Follow program prescription in THR  Short: Add 1-2 days of exercise outside of class. Start interval training before possible early exit (due to copay) Long: Safely and independently exercise at community facility.          Discharge Exercise Prescription (Final Exercise Prescription Changes): Exercise Prescription Changes - 08/05/17 1600      Response to Exercise   Duration  Progress to 45 minutes of aerobic exercise without signs/symptoms of physical distress    Intensity  THRR unchanged      Progression   Progression  Continue to progress workloads to maintain intensity without signs/symptoms of physical distress.    Average METs  2.8      Resistance Training   Training Prescription  Yes    Weight  4 lb    Reps  10-15      Interval Training   Interval Training  No      Recumbant Bike   Level  3    Watts  26    Minutes  15    METs  2.8      T5 Nustep   Level  3    Minutes  15      Home Exercise Plan   Plans to continue exercise at  [facility] (comment) [facility]  (possible early exit due to large copay)   [facility]  (possible early exit due to large copay)   Frequency  Add 2 additional days to program exercise sessions.    Initial Home Exercises Provided  08/05/17        Nutrition:  Target Goals: Understanding of nutrition guidelines, daily intake of sodium <1555m, cholesterol <2014m calories 30% from fat and 7% or less from saturated fats, daily to have 5 or more servings of fruits and vegetables.  Biometrics: Pre Biometrics - 07/29/17 1430      Pre Biometrics   Height  6' 0.5" (1.842 m)    Weight  220 lb 4.8 oz (99.9 kg)    Waist Circumference  41 inches    Hip Circumference  46 inches    Waist to Hip Ratio  0.89 %    BMI (Calculated)  29.45    Single Leg Stand  17.5 seconds        Nutrition Therapy Plan and Nutrition Goals:   Nutrition Discharge: Rate Your Plate Scores: Nutrition Assessments - 07/29/17 1431      MEDFICTS Scores   Pre Score  48       Nutrition Goals Re-Evaluation:   Nutrition Goals Discharge (Final Nutrition Goals Re-Evaluation):   Psychosocial: Target  Goals: Acknowledge presence or absence of significant depression and/or stress, maximize coping skills, provide positive support system. Participant is able to verbalize types and ability to use techniques and skills needed for reducing stress and depression.   Initial Review & Psychosocial Screening: Initial Psych Review & Screening - 07/29/17 1438      Initial Review   Current issues with  History of Depression      Family Dynamics   Good Support System?  Yes      Screening Interventions   Interventions  Yes;Encouraged to exercise;Program counselor consult    Expected Outcomes  Short Term goal: Utilizing psychosocial counselor, staff and physician to assist with identification of specific Stressors or current issues interfering with healing process. Setting desired goal for each stressor or current issue identified.;Long Term Goal: Stressors or current issues are controlled or eliminated.;Short Term goal: Identification and review with participant of any Quality of Life or Depression concerns found by scoring the questionnaire.;Long Term goal: The  participant improves quality of Life and PHQ9 Scores as seen by post scores and/or verbalization of changes       Quality of Life Scores:  Quality of Life - 07/29/17 1438      Quality of Life Scores   Health/Function Pre  20.38 %    Socioeconomic Pre  20 %    Psych/Spiritual Pre  20.29 %    Family Pre  20.5 %    GLOBAL Pre  20.32 %       PHQ-9: Recent Review Flowsheet Data    Depression screen [facility] 2/9 07/29/2017 02/11/2017 08/07/2016 05/09/2015 01/14/2015   Decreased Interest 2 0 0 0 0   Down, Depressed, Hopeless 1 0 0 0 0   PHQ - 2 Score 3 0 0 0 0   Altered sleeping 0 - - - -   Tired, decreased energy 1 - - - -   Change in appetite 2 - - - -   Feeling bad or failure about yourself  0 - - - -   Trouble concentrating 0 - - - -   Moving slowly or fidgety/restless 1 - - - -   Suicidal thoughts 0 - - - -   PHQ-9 Score 7 - - - -   Difficult doing work/chores Not difficult at all - - - -     Interpretation of Total Score  Total Score Depression Severity:  1-4 = Minimal depression, 5-9 = Mild depression, 10-14 = Moderate depression, 15-19 = Moderately severe depression, 20-27 = Severe depression   Psychosocial Evaluation and Intervention: Psychosocial Evaluation - 08/02/17 1706      Psychosocial Evaluation & Interventions   Interventions  Encouraged to exercise with the program and follow exercise prescription;Relaxation education    Comments  Counselor met with Mr. Schewe (Sam) today for initial psychosocial evaluation.  He turned 71 years old this past Saturday.  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  Sam has a strong support system with a spouse of 66 years; a daughter and a sister who live close by; and he is actively involved in his local church.  Sam reports being in fairly good health other than his heart; sleeps well and has a good appetite.  Sam admits to a history of  depression 7 years ago; but has been in a more positive mood until this last cardiac event.  He admits to mild depressive  symptoms currently as he is adjusting to this news of being inoperable.  He has had a stressful past year with a spouse who has had (2) surgeries and now his own health.  Sam is also concerned about finances with a very high co-pay in this program - so he doesn't think he will be able to complete it.  He would like a baseline before he is released to know what to expect in exercising on his own with his heart condition.  He is a member at the [employer] and plans to exercise there 3-5 times/week once he leaves this program.  Counselor provided Sam some depression education and resources if needed in the future.      Expected Outcomes  Sam will benefit from consistent exercise to achieve his stated goals to get a "baseline" before released here.  He also will benefit from counseling resources if his depressive symptoms worsen - counselor provided info on this for Sam.         Psychosocial Re-Evaluation:   Psychosocial Discharge (Final Psychosocial Re-Evaluation):   Vocational Rehabilitation: Provide vocational rehab assistance to qualifying candidates.   Vocational Rehab Evaluation & Intervention: Vocational Rehab - 07/29/17 1440      Initial Vocational Rehab Evaluation & Intervention   Assessment shows need for Vocational Rehabilitation  No       Education: Education Goals: Education classes will be provided on a variety of topics geared toward better understanding of heart health and risk factor modification. Participant will state understanding/return demonstration of topics presented as noted by education test scores.  Learning Barriers/Preferences: Learning Barriers/Preferences - 07/29/17 1439      Learning Barriers/Preferences   Learning Barriers  None    Learning Preferences  Verbal Instruction       Education Topics: General Nutrition Guidelines/Fats and  Fiber: -Group instruction provided by verbal, written material, models and posters to present the general guidelines for heart healthy nutrition. Gives an explanation and review of dietary fats and fiber.   Controlling Sodium/Reading Food Labels: -Group verbal and written material supporting the discussion of sodium use in heart healthy nutrition. Review and explanation with models, verbal and written materials for utilization of the food label.   Exercise Physiology & Risk Factors: - Group verbal and written instruction with models to review the exercise physiology of the cardiovascular system and associated critical values. Details cardiovascular disease risk factors and the goals associated with each risk factor.   Cardiac Rehab from 08/09/2017 in [facility] Cardiac and Pulmonary Rehab  Date  08/02/17  Educator  [facility]  Instruction Review Code  1- Verbalizes Understanding      Aerobic Exercise & Resistance Training: - Gives group verbal and written discussion on the health impact of inactivity. On the components of aerobic and resistive training programs and the benefits of this training and how to safely progress through these programs.   Cardiac Rehab from 08/09/2017 in [facility] Cardiac and Pulmonary Rehab  Date  08/04/17  Educator  [facility]  Instruction Review Code  1- Verbalizes Understanding      Flexibility, Balance, General Exercise Guidelines: - Provides group verbal and written instruction on the benefits of flexibility and balance training programs. Provides general exercise guidelines with specific guidelines to those with heart or lung disease. Demonstration and skill practice provided.   Cardiac Rehab from 08/09/2017 in [facility] Cardiac and Pulmonary Rehab  Date  08/09/17  Educator  [facility]  Instruction Review Code  1- Verbalizes Understanding  Stress Management: - Provides group verbal and written instruction about the health risks of elevated stress, cause of high stress, and healthy ways  to reduce stress.   Depression: - Provides group verbal and written instruction on the correlation between heart/lung disease and depressed mood, treatment options, and the stigmas associated with seeking treatment.   Anatomy & Physiology of the Heart: - Group verbal and written instruction and models provide basic cardiac anatomy and physiology, with the coronary electrical and arterial systems. Review of: AMI, Angina, Valve disease, Heart Failure, Cardiac Arrhythmia, Pacemakers, and the ICD.   Cardiac Procedures: - Group verbal and written instruction to review commonly prescribed medications for heart disease. Reviews the medication, class of the drug, and side effects. Includes the steps to properly store meds and maintain the prescription regimen. (beta blockers and nitrates)   Cardiac Medications I: - Group verbal and written instruction to review commonly prescribed medications for heart disease. Reviews the medication, class of the drug, and side effects. Includes the steps to properly store meds and maintain the prescription regimen.   Cardiac Medications II: -Group verbal and written instruction to review commonly prescribed medications for heart disease. Reviews the medication, class of the drug, and side effects. (all other drug classes)    Go Sex-Intimacy & Heart Disease, Get SMART - Goal Setting: - Group verbal and written instruction through game format to discuss heart disease and the return to sexual intimacy. Provides group verbal and written material to discuss and apply goal setting through the application of the S.M.A.R.T. Method.   Other Matters of the Heart: - Provides group verbal, written materials and models to describe Heart Failure, Angina, Valve Disease, Peripheral Artery Disease, and Diabetes in the realm of heart disease. Includes description of the disease process and treatment options available to the cardiac patient.   Exercise & Equipment Safety: -  Individual verbal instruction and demonstration of equipment use and safety with use of the equipment.   Cardiac Rehab from 08/09/2017 in [facility] Cardiac and Pulmonary Rehab  Date  07/29/17  Educator  [facility]  Instruction Review Code  1- Verbalizes Understanding      Infection Prevention: - Provides verbal and written material to individual with discussion of infection control including proper hand washing and proper equipment cleaning during exercise session.   Cardiac Rehab from 08/09/2017 in [facility] Cardiac and Pulmonary Rehab  Date  07/29/17  Educator  [facility]  Instruction Review Code  1- Verbalizes Understanding      Falls Prevention: - Provides verbal and written material to individual with discussion of falls prevention and safety.   Cardiac Rehab from 08/09/2017 in [facility] Cardiac and Pulmonary Rehab  Date  07/29/17  Educator  [facility]  Instruction Review Code  1- Verbalizes Understanding      Diabetes: - Individual verbal and written instruction to review signs/symptoms of diabetes, desired ranges of glucose level fasting, after meals and with exercise. Acknowledge that pre and post exercise glucose checks will be done for 3 sessions at entry of program.   Other: -Provides group and verbal instruction on various topics (see comments)    Knowledge Questionnaire Score: Knowledge Questionnaire Score - 07/29/17 1439      Knowledge Questionnaire Score   Pre Score  24/28  Correct answers reviewed with Sam   Correct answers reviewed with Sam      Core Components/Risk Factors/Patient Goals at Admission: Personal Goals and Risk Factors at Admission - 07/29/17 1420      Core Components/Risk Factors/Patient  Goals on Admission    Weight Management  Yes;Weight Loss    Intervention  Obesity: Provide education and appropriate resources to help participant work on and attain dietary goals.;Weight Management/Obesity: Establish reasonable short term and long term weight goals.    Admit Weight  220 lb  4.8 oz (99.9 kg)    Expected Outcomes  Short Term: Continue to assess and modify interventions until short term weight is achieved;Long Term: Adherence to nutrition and physical activity/exercise program aimed toward attainment of established weight goal;Weight Loss: Understanding of general recommendations for a balanced deficit meal plan, which promotes 1-2 lb weight loss per week and includes a negative energy balance of [phone] kcal/d;Understanding recommendations for meals to include 15-35% energy as protein, 25-35% energy from fat, 35-60% energy from carbohydrates, less than 254m of dietary cholesterol, 20-35 gm of total fiber daily;Understanding of distribution of calorie intake throughout the day with the consumption of 4-5 meals/snacks    Heart Failure  Yes    Intervention  Provide a combined exercise and nutrition program that is supplemented with education, support and counseling about heart failure. Directed toward relieving symptoms such as shortness of breath, decreased exercise tolerance, and extremity edema.    Expected Outcomes  Improve functional capacity of life;Short term: Attendance in program 2-3 days a week with increased exercise capacity. Reported lower sodium intake. Reported increased fruit and vegetable intake. Reports medication compliance.;Short term: Daily weights obtained and reported for increase. Utilizing diuretic protocols set by physician.;Long term: Adoption of self-care skills and reduction of barriers for early signs and symptoms recognition and intervention leading to self-care maintenance.    Hypertension  Yes    Intervention  Provide education on lifestyle modifcations including regular physical activity/exercise, weight management, moderate sodium restriction and increased consumption of fresh fruit, vegetables, and low fat dairy, alcohol moderation, and smoking cessation.;Monitor prescription use compliance.    Expected Outcomes  Short Term: Continued assessment  and intervention until BP is < 140/976mHG in hypertensive participants. < 130/8041mG in hypertensive participants with diabetes, heart failure or chronic kidney disease.;Long Term: Maintenance of blood pressure at goal levels.    Lipids  Yes    Intervention  Provide education and support for participant on nutrition & aerobic/resistive exercise along with prescribed medications to achieve LDL <57m11mDL >40mg57m Expected Outcomes  Short Term: Participant states understanding of desired cholesterol values and is compliant with medications prescribed. Participant is following exercise prescription and nutrition guidelines.;Long Term: Cholesterol controlled with medications as prescribed, with individualized exercise RX and with personalized nutrition plan. Value goals: LDL < 57mg,64m > 40 mg.    Stress  Yes Financial, a lot has happened this past year with his health and his wife's. Insurance is proving to be difficult to deal with and is causing lots of stress   Financial, a lot has happened this past year with his health and his wife's. Insurance is proving to be difficult to deal with and is causing lots of stress   Intervention  Offer individual and/or small group education and counseling on adjustment to heart disease, stress management and health-related lifestyle change. Teach and support self-help strategies.;Refer participants experiencing significant psychosocial distress to appropriate mental health specialists for further evaluation and treatment. When possible, include family members and significant others in education/counseling sessions.    Expected Outcomes  Short Term: Participant demonstrates changes in health-related behavior, relaxation and other stress management skills, ability to obtain effective social support, and compliance with psychotropic  medications if prescribed.;Long Term: Emotional wellbeing is indicated by absence of clinically significant psychosocial distress or social  isolation.       Core Components/Risk Factors/Patient Goals Review:    Core Components/Risk Factors/Patient Goals at Discharge (Final Review):    ITP Comments: ITP Comments    Row Name 07/29/17 1416 08/11/17 0601         ITP Comments  Med Review completed. Initial ITP created. Diagnosis can be found in [facility] 07/27/17  30 day review. Continue with ITP unless directed changes per Medical Director review.  New to program         Comments:

## 2017-08-12 ENCOUNTER — Other Ambulatory Visit: Payer: Medicare HMO

## 2017-08-12 ENCOUNTER — Ambulatory Visit (INDEPENDENT_AMBULATORY_CARE_PROVIDER_SITE_OTHER): Payer: Medicare HMO

## 2017-08-12 VITALS — BP 120/62 | HR 62 | Temp 97.8°F | Ht 73.0 in | Wt 218.0 lb

## 2017-08-12 DIAGNOSIS — R635 Abnormal weight gain: Secondary | ICD-10-CM | POA: Diagnosis not present

## 2017-08-12 DIAGNOSIS — I10 Essential (primary) hypertension: Secondary | ICD-10-CM | POA: Diagnosis not present

## 2017-08-12 DIAGNOSIS — Z0000 Encounter for general adult medical examination without abnormal findings: Secondary | ICD-10-CM

## 2017-08-12 MED ORDER — TETANUS-DIPHTH-ACELL PERTUSSIS 5-2.5-18.5 LF-MCG/0.5 IM SUSP: 0.5000 mL | Freq: Once | INTRAMUSCULAR | 0 refills | Status: AC

## 2017-08-12 NOTE — Patient Instructions (Addendum)
[name] , Thank you for taking time to come for your Medicare Wellness Visit. I appreciate your ongoing commitment to your health goals. Please review the following plan we discussed and let me know if I can assist you in the future.   Screening recommendations/referrals: Colonoscopy up to date. Due 03/22/2019 Recommended yearly ophthalmology/optometry visit for glaucoma screening and checkup Recommended yearly dental visit for hygiene and checkup  Vaccinations: Influenza vaccine up to date. Due 2019 fall season Pneumococcal vaccine up to date Tdap vaccine due, prescription sent to pharmacy Shingles vaccine up to date. Waiting to get the second shingles vaccine  Advanced directives: In Chart  Conditions/risks identified: None  Next appointment: Dr. Mariea Clonts 08/16/2017 @ 9am  Preventive Care 72 Years and Older, Male Preventive care refers to lifestyle choices and visits with your health care provider that can promote health and wellness. What does preventive care include?  A yearly physical exam. This is also called an annual well check.  Dental exams once or twice a year.  Routine eye exams. Ask your health care provider how often you should have your eyes checked.  Personal lifestyle choices, including:  Daily care of your teeth and gums.  Regular physical activity.  Eating a healthy diet.  Avoiding tobacco and drug use.  Limiting alcohol use.  Practicing safe sex.  Taking low doses of aspirin every day.  Taking vitamin and mineral supplements as recommended by your health care provider. What happens during an annual well check? The services and screenings done by your health care provider during your annual well check will depend on your age, overall health, lifestyle risk factors, and family history of disease. Counseling  Your health care provider may ask you questions about your:  Alcohol use.  Tobacco use.  Drug use.  Emotional well-being.  Home  and relationship well-being.  Sexual activity.  Eating habits.  History of falls.  Memory and ability to understand (cognition).  Work and work Statistician. Screening  You may have the following tests or measurements:  Height, weight, and BMI.  Blood pressure.  Lipid and cholesterol levels. These may be checked every 5 years, or more frequently if you are over 55 years old.  Skin check.  Lung cancer screening. You may have this screening every year starting at age 10 if you have a 30-pack-year history of smoking and currently smoke or have quit within the past 15 years.  Fecal occult blood test (FOBT) of the stool. You may have this test every year starting at age 39.  Flexible sigmoidoscopy or colonoscopy. You may have a sigmoidoscopy every 5 years or a colonoscopy every 10 years starting at age 37.  Prostate cancer screening. Recommendations will vary depending on your family history and other risks.  Hepatitis C blood test.  Hepatitis B blood test.  Sexually transmitted disease (STD) testing.  Diabetes screening. This is done by checking your blood sugar (glucose) after you have not eaten for a while (fasting). You may have this done every 1-3 years.  Abdominal aortic aneurysm (AAA) screening. You may need this if you are a current or former smoker.  Osteoporosis. You may be screened starting at age 10 if you are at high risk. Talk with your health care provider about your test results, treatment options, and if necessary, the need for more tests. Vaccines  Your health care provider may recommend certain vaccines, such as:  Influenza vaccine. This is recommended every year.  Tetanus, diphtheria, and acellular pertussis (Tdap, Td)  vaccine. You may need a Td booster every 10 years.  Zoster vaccine. You may need this after age 58.  Pneumococcal 13-valent conjugate (PCV13) vaccine. One dose is recommended after age 65.  Pneumococcal polysaccharide (PPSV23) vaccine.  One dose is recommended after age 16. Talk to your health care provider about which screenings and vaccines you need and how often you need them. This information is not intended to replace advice given to you by your health care provider. Make sure you discuss any questions you have with your health care provider. Document Released: 10/18/2015 Document Revised: 06/10/2016 Document Reviewed: 07/23/2015 Elsevier Interactive Patient Education  2017 [place] Prevention in the Home Falls can cause injuries. They can happen to people of all ages. There are many things you can do to make your home safe and to help prevent falls. What can I do on the outside of my home?  Regularly fix the edges of walkways and driveways and fix any cracks.  Remove anything that might make you trip as you walk through a door, such as a raised step or threshold.  Trim any bushes or trees on the path to your home.  Use bright outdoor lighting.  Clear any walking paths of anything that might make someone trip, such as rocks or tools.  Regularly check to see if handrails are loose or broken. Make sure that both sides of any steps have handrails.  Any raised decks and porches should have guardrails on the edges.  Have any leaves, snow, or ice cleared regularly.  Use sand or salt on walking paths during winter.  Clean up any spills in your garage right away. This includes oil or grease spills. What can I do in the bathroom?  Use night lights.  Install grab bars by the toilet and in the tub and shower. Do not use towel bars as grab bars.  Use non-skid mats or decals in the tub or shower.  If you need to sit down in the shower, use a plastic, non-slip stool.  Keep the floor dry. Clean up any water that spills on the floor as soon as it happens.  Remove soap buildup in the tub or shower regularly.  Attach bath mats securely with double-sided non-slip rug tape.  Do not have throw rugs and other  things on the floor that can make you trip. What can I do in the bedroom?  Use night lights.  Make sure that you have a light by your bed that is easy to reach.  Do not use any sheets or blankets that are too big for your bed. They should not hang down onto the floor.  Have a firm chair that has side arms. You can use this for support while you get dressed.  Do not have throw rugs and other things on the floor that can make you trip. What can I do in the kitchen?  Clean up any spills right away.  Avoid walking on wet floors.  Keep items that you use a lot in easy-to-reach places.  If you need to reach something above you, use a strong step stool that has a grab bar.  Keep electrical cords out of the way.  Do not use floor polish or wax that makes floors slippery. If you must use wax, use non-skid floor wax.  Do not have throw rugs and other things on the floor that can make you trip. What can I do with my stairs?  Do not leave  any items on the stairs.  Make sure that there are handrails on both sides of the stairs and use them. Fix handrails that are broken or loose. Make sure that handrails are as long as the stairways.  Check any carpeting to make sure that it is firmly attached to the stairs. Fix any carpet that is loose or worn.  Avoid having throw rugs at the top or bottom of the stairs. If you do have throw rugs, attach them to the floor with carpet tape.  Make sure that you have a light switch at the top of the stairs and the bottom of the stairs. If you do not have them, ask someone to add them for you. What else can I do to help prevent falls?  Wear shoes that:  Do not have high heels.  Have rubber bottoms.  Are comfortable and fit you well.  Are closed at the toe. Do not wear sandals.  If you use a stepladder:  Make sure that it is fully opened. Do not climb a closed stepladder.  Make sure that both sides of the stepladder are locked into place.  Ask  someone to hold it for you, if possible.  Clearly mark and make sure that you can see:  Any grab bars or handrails.  First and last steps.  Where the edge of each step is.  Use tools that help you move around (mobility aids) if they are needed. These include:  Canes.  Walkers.  Scooters.  Crutches.  Turn on the lights when you go into a dark area. Replace any light bulbs as soon as they burn out.  Set up your furniture so you have a clear path. Avoid moving your furniture around.  If any of your floors are uneven, fix them.  If there are any pets around you, be aware of where they are.  Review your medicines with your doctor. Some medicines can make you feel dizzy. This can increase your chance of falling. Ask your doctor what other things that you can do to help prevent falls. This information is not intended to replace advice given to you by your health care provider. Make sure you discuss any questions you have with your health care provider. Document Released: 07/18/2009 Document Revised: 02/27/2016 Document Reviewed: 10/26/2014 Elsevier Interactive Patient Education  2017 [employer].

## 2017-08-12 NOTE — Progress Notes (Signed)
Subjective:   Mitchell Stephens. is a 71 y.o. male who presents for Medicare Annual/Subsequent preventive examination  Last AWV-08/07/2016    Objective:    Vitals: BP 120/62 (BP Location: Right Arm, Patient Position: Sitting)   Pulse 62   Temp 97.8 F (36.6 C) (Oral)   Ht 6\' 1"  (1.854 m)   Wt 218 lb (98.9 kg)   SpO2 97%   BMI 28.76 kg/m   Body mass index is 28.76 kg/m.  Tobacco Social History   Tobacco Use  Smoking Status Former Smoker  . Years: 40.00  . Last attempt to quit: 09/04/2015  . Years since quitting: 1.9  Smokeless Tobacco Former Engineer, structural given: Not Answered   Past Medical History:  Diagnosis Date  . Coronary artery disease    a. 06/2017 MV: EF 45%, large inf and apical defect w/o ischemia;  b. 06/2017 Cath: LM nl, LAD 30ost, LCX 50p, OM1/2 ok, RCA 100p w/ L->R collats. EF 35%.  . Depression with anxiety   . Gastric ulcer, acute with hemorrhage 2001   Dr. Sharlet Salina, [facility]  . H/O congenital atrial septal defect (ASD) repair   . Helicobacter pylori gastritis   . Heme positive stool   . History of gout   . History of peptic ulcer disease   . History of PSVT (paroxysmal supraventricular tachycardia)   . Hyperlipidemia   . Hypertension   . Ischemic cardiomyopathy    a. 06/2017 LV gram: EF 35%.  . Obstructive sleep apnea    on CPAP  . Perennial allergic rhinitis   . Tobacco abuse    Past Surgical History:  Procedure Laterality Date  . ASD REPAIR, OSTIUM PRIMUM  1966  . 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 open heart surgery  1966   Dr. Annamaria Boots ([facility]); was PFO repair--has been normal in f/u  . PALATE / UVULA BIOPSY / EXCISION  2000   Dr.Sprenhe [facility])  . TONSILLECTOMY AND ADENOIDECTOMY  1959   Family History  Problem Relation Age of Onset  . Failure to thrive Mother   . Arthritis Mother    Social History   Substance and Sexual Activity  Sexual Activity Yes    Outpatient Encounter Medications as of 08/12/2017    Medication Sig  . aspirin EC 81 MG tablet Take 81 mg by mouth daily.  Marland Kitchen buPROPion (WELLBUTRIN XL) 300 MG 24 hr tablet Take 300 mg by mouth daily.  . hydrochlorothiazide (HYDRODIURIL) 25 MG tablet Take one tablet by mouth once daily  . isosorbide mononitrate (IMDUR) 30 MG 24 hr tablet Take 0.5 tablets (15 mg total) by mouth daily.  Marland Kitchen losartan (COZAAR) 25 MG tablet Take 1 tablet (25 mg total) by mouth daily.  . meloxicam (MOBIC) 15 MG tablet Take 1 tablet (15 mg total) by mouth as needed for pain. (Patient taking differently: Take 15 mg by mouth daily. )  . metoprolol succinate (TOPROL-XL) 100 MG 24 hr tablet Take 1 tablet (100 mg total) by mouth daily.  . naphazoline-glycerin (CLEAR EYES) 0.012-0.2 % SOLN Place 1-2 drops into both eyes 4 (four) times daily as needed (for redness).  Marland Kitchen senna (SENOKOT) 8.6 MG tablet Take 2 tablets by mouth at bedtime.  . simvastatin (ZOCOR) 40 MG tablet Take one tablet by mouth once daily (Patient taking differently: Take 40 mg by mouth at bedtime. Take one tablet by mouth once daily)  . Tdap (BOOSTRIX) 5-2.5-18.5 LF-MCG/0.5 injection Inject 0.5  mLs once for 1 dose into the muscle.  . traZODone (DESYREL) 50 MG tablet TAKE 1 TABLET AT BEDTIME AS NEEDED  FOR  INSOMNIA (Patient taking differently: Take 50 mg by mouth at bedtime as needed for sleep. TAKE 1 TABLET AT BEDTIME AS NEEDED  FOR  INSOMNIA)  . [DISCONTINUED] Tdap (BOOSTRIX) 5-2.5-18.5 LF-MCG/0.5 injection Inject 0.5 mLs once into the muscle.  . nitroGLYCERIN (NITROSTAT) 0.4 MG SL tablet Place 1 tablet (0.4 mg total) under the tongue every 5 (five) minutes as needed for chest pain.  . [DISCONTINUED] metoprolol succinate (TOPROL-XL) 50 MG 24 hr tablet Take 50 mg by mouth daily.  . [DISCONTINUED] oxymetazoline (AFRIN) 0.05 % nasal spray Place 1 spray into both nostrils 2 (two) times daily as needed for congestion.  . [DISCONTINUED] verapamil (CALAN-SR) 240 MG CR tablet TAKE 2 TABLETS (480 MG TOTAL) BY MOUTH DAILY.    No facility-administered encounter medications on file as of 08/12/2017.     Activities of Daily Living In your present state of health, do you have any difficulty performing the following activities: 08/12/2017  Hearing? N  Vision? N  Difficulty concentrating or making decisions? Y  Walking or climbing stairs? N  Dressing or bathing? N  Doing errands, shopping? N  Preparing Food and eating ? N  Using the Toilet? N  In the past six months, have you accidently leaked urine? N  Do you have problems with loss of bowel control? N  Managing your Medications? N  Managing your Finances? N  Housekeeping or managing your Housekeeping? N  Some recent data might be hidden    Patient Care Team: Gayland Curry, DO as PCP - General (Geriatric Medicine) [name], [place] as Referring Physician (Dermatology) [name], MD as Referring Physician (Otolaryngology)   Assessment:      Exercise Activities and Dietary recommendations Current Exercise Habits: Structured exercise class, Type of exercise: Other - see comments(Cardiac rehab), Time (Minutes): 30, Frequency (Times/Week): 3, Weekly Exercise (Minutes/Week): 90, Intensity: Mild, Exercise limited by: cardiac condition(s)  Goals    None     Fall Risk Fall Risk  08/12/2017 07/29/2017 04/08/2017 02/11/2017 08/07/2016  Falls in the past year? No No No No No   Depression Screen PHQ 2/9 Scores 08/12/2017 07/29/2017 02/11/2017 08/07/2016  PHQ - 2 Score 1 3 0 0  PHQ- 9 Score - 7 - -    Cognitive Function MMSE - Mini Mental State Exam 08/12/2017 08/07/2016 05/09/2015  Orientation to time 4 5 4   Orientation to Place 5 5 5   Registration 3 3 3   Attention/ Calculation 5 5 5   Recall 3 3 1   Language- name 2 objects 2 2 2   Language- repeat 1 1 1   Language- follow 3 step command 3 3 3   Language- read & follow direction 1 1 1   Write a sentence 1 1 1   Copy design 1 1 1   Total score 29 30 27         Immunization History  Administered  Date(s) Administered  . Influenza, High Dose Seasonal PF 06/25/2017  . Influenza,inj,Quad PF,6+ Mos 08/27/2015, 07/10/2016  . Pneumococcal Conjugate-13 10/05/2013  . Pneumococcal Polysaccharide-23 08/07/2016  . Zoster Recombinat (Shingrix) 07/06/2017   Screening Tests Health Maintenance  Topic Date Due  . Hepatitis C Screening  10-05-1946  . TETANUS/TDAP  07/31/1964  . COLONOSCOPY  03/22/2019  . INFLUENZA VACCINE  Completed  . PNA vac Low Risk Adult  Completed      Plan:  I have personally reviewed  and addressed the Medicare Annual Wellness questionnaire and have noted the following in the patient's chart:  A. Medical and social history B. Use of alcohol, tobacco or illicit drugs  C. Current medications and supplements D. Functional ability and status E.  Nutritional status F.  Physical activity G. Advance directives H. List of other physicians I.  Hospitalizations, surgeries, and ER visits in previous 12 months J.  [place] to include hearing, vision, cognitive, depression L. Referrals and appointments - none  In addition, I have reviewed and discussed with patient certain preventive protocols, quality metrics, and best practice recommendations. A written personalized care plan for preventive services as well as general preventive health recommendations were provided to patient.  See attached scanned questionnaire for additional information.   Signed,   Rich Reining, RN Nurse Health Advisor   Quick Notes   Health Maintenance: tDap prescription sent to pharmacy. Pt waiting on second Shingrix vaccine.     Abnormal Screen: MMSE 29/30 Passed clock drawing     Patient Concerns: None     Nurse Concerns: None

## 2017-08-13 LAB — CBC WITH DIFFERENTIAL/PLATELET
Basophils Absolute: 91 cells/uL (ref 0–200)
Basophils Relative: 1.3 %
Eosinophils Absolute: 280 cells/uL (ref 15–500)
Eosinophils Relative: 4 %
HCT: 39.6 % (ref 38.5–50.0)
Hemoglobin: 13.9 g/dL (ref 13.2–17.1)
Lymphs Abs: 1666 cells/uL (ref 850–3900)
MCH: 31.5 pg (ref 27.0–33.0)
MCHC: 35.1 g/dL (ref 32.0–36.0)
MCV: 89.8 fL (ref 80.0–100.0)
MPV: 10 fL (ref 7.5–12.5)
Monocytes Relative: 8.1 %
Neutro Abs: 4396 cells/uL (ref 1500–7800)
Neutrophils Relative %: 62.8 %
Platelets: 233 10*3/uL (ref 140–400)
RBC: 4.41 10*6/uL (ref 4.20–5.80)
RDW: 12.5 % (ref 11.0–15.0)
Total Lymphocyte: 23.8 %
WBC mixed population: 567 cells/uL (ref 200–950)
WBC: 7 10*3/uL (ref 3.8–10.8)

## 2017-08-13 LAB — COMPLETE METABOLIC PANEL WITH GFR
AG Ratio: 1.8 (calc) (ref 1.0–2.5)
ALT: 16 U/L (ref 9–46)
AST: 16 U/L (ref 10–35)
Albumin: 4 g/dL (ref 3.6–5.1)
Alkaline phosphatase (APISO): 39 U/L — ABNORMAL LOW (ref 40–115)
BUN: 23 mg/dL (ref 7–25)
CO2: 29 mmol/L (ref 20–32)
Calcium: 8.9 mg/dL (ref 8.6–10.3)
Chloride: 102 mmol/L (ref 98–110)
Creat: 1.15 mg/dL (ref 0.70–1.18)
GFR, Est African American: 73 mL/min/{1.73_m2} (ref >=60)
GFR, Est Non African American: 63 mL/min/{1.73_m2} (ref >=60)
Globulin: 2.2 g/dL (calc) (ref 1.9–3.7)
Glucose, Bld: 108 mg/dL — ABNORMAL HIGH (ref 65–99)
Potassium: 4.2 mmol/L (ref 3.5–5.3)
Sodium: 137 mmol/L (ref 135–146)
Total Bilirubin: 0.5 mg/dL (ref 0.2–1.2)
Total Protein: 6.2 g/dL (ref 6.1–8.1)

## 2017-08-13 LAB — HEMOGLOBIN A1C
Hgb A1c MFr Bld: 5.6 % of total Hgb (ref <5.7)
Mean Plasma Glucose: 114 (calc)
eAG (mmol/L): 6.3 (calc)

## 2017-08-13 LAB — LIPID PANEL
Cholesterol: 128 mg/dL (ref <200)
HDL: 47 mg/dL (ref >40)
LDL Cholesterol (Calc): 61 mg/dL (calc)
Non-HDL Cholesterol (Calc): 81 mg/dL (calc) (ref <130)
Total CHOL/HDL Ratio: 2.7 (calc) (ref <5.0)
Triglycerides: 115 mg/dL (ref <150)

## 2017-08-16 ENCOUNTER — Encounter: Payer: Self-pay | Admitting: Internal Medicine

## 2017-08-16 ENCOUNTER — Ambulatory Visit (INDEPENDENT_AMBULATORY_CARE_PROVIDER_SITE_OTHER): Payer: Medicare HMO | Admitting: Internal Medicine

## 2017-08-16 ENCOUNTER — Encounter: Payer: Medicare HMO | Admitting: *Deleted

## 2017-08-16 VITALS — BP 110/68 | HR 58 | Temp 98.0°F | Ht 73.0 in | Wt 219.0 lb

## 2017-08-16 DIAGNOSIS — I200 Unstable angina: Secondary | ICD-10-CM | POA: Diagnosis not present

## 2017-08-16 DIAGNOSIS — I10 Essential (primary) hypertension: Secondary | ICD-10-CM | POA: Diagnosis not present

## 2017-08-16 DIAGNOSIS — M1711 Unilateral primary osteoarthritis, right knee: Secondary | ICD-10-CM | POA: Diagnosis not present

## 2017-08-16 DIAGNOSIS — E782 Mixed hyperlipidemia: Secondary | ICD-10-CM | POA: Diagnosis not present

## 2017-08-16 DIAGNOSIS — I214 Non-ST elevation (NSTEMI) myocardial infarction: Secondary | ICD-10-CM

## 2017-08-16 DIAGNOSIS — R69 Illness, unspecified: Secondary | ICD-10-CM | POA: Diagnosis not present

## 2017-08-16 DIAGNOSIS — I25110 Atherosclerotic heart disease of native coronary artery with unstable angina pectoris: Secondary | ICD-10-CM

## 2017-08-16 DIAGNOSIS — I5022 Chronic systolic (congestive) heart failure: Secondary | ICD-10-CM

## 2017-08-16 DIAGNOSIS — F325 Major depressive disorder, single episode, in full remission: Secondary | ICD-10-CM

## 2017-08-16 DIAGNOSIS — R739 Hyperglycemia, unspecified: Secondary | ICD-10-CM

## 2017-08-16 MED ORDER — ACETAMINOPHEN 500 MG PO TABS: 1000.0000 mg | ORAL_TABLET | Freq: Every day | ORAL | 0 refills | Status: DC

## 2017-08-16 NOTE — Progress Notes (Signed)
Daily Session Note  Patient Details  Name: Mitchell Stephens. MRN: [id number] Date of Birth: 09-10-1946 Referring Provider:     Cardiac Rehab from 07/29/2017 in [facility] Cardiac and Pulmonary Rehab  Referring Provider  [place]      Encounter Date: 08/16/2017  Check In: Session Check In - 08/16/17 1708      Check-In   Location  [facility]-Cardiac & Pulmonary Rehab    Staff Present  Earlean Shawl, BS, ACSM CEP, Exercise Physiologist;Amanda Oletta Darter, BA, ACSM CEP, Exercise Physiologist;Meredith Sherryll Burger, RN BSN    Supervising physician immediately available to respond to emergencies  See telemetry face sheet for immediately available ER MD    Medication changes reported      No    Fall or balance concerns reported     No    Warm-up and Cool-down  Performed on first and last piece of equipment    Resistance Training Performed  Yes    VAD Patient?  No      Pain Assessment   Currently in Pain?  No/denies    Multiple Pain Sites  No          Social History   Tobacco Use  Smoking Status Former Smoker  . Years: 40.00  . Last attempt to quit: 09/04/2015  . Years since quitting: 1.9  Smokeless Tobacco Former Environmental health practitioner Met:  [place] with exercise equipment Exercise tolerated well No report of cardiac concerns or symptoms Strength training completed today  Goals Unmet:  Not Applicable  Comments: Pt able to follow exercise prescription today without complaint.  Will continue to monitor for progression.    Dr. Emily Filbert is Medical Director for [place] [facility].

## 2017-08-16 NOTE — Patient Instructions (Signed)
Stop your mobic due to effects on the heart. Start tylenol ES 1000mg  each am for arthritis pains.

## 2017-08-16 NOTE — Progress Notes (Signed)
Provider:  Rexene Edison. Mariea Clonts, D.O., C.M.D. Location:   [place]   Place of Service:   clinic  Previous PCP: Gayland Curry, DO Patient Care Team: Gayland Curry, DO as PCP - General (Geriatric Medicine) [name], [place] as Referring Physician (Dermatology) [name], MD as Referring Physician (Otolaryngology)  Extended Emergency Contact Information Primary Emergency Contact: [facility] Address: [address]          [place], [place] [address] [name] of [place] Phone: [phone] Mobile Phone: [phone] Relation: Spouse  Code Status: DNR Goals of Care: Advanced Directive information Advanced Directives 08/12/2017  Does Patient Have a Medical Advance Directive? Yes  Type of Paramedic of [place];Living will  Does patient want to make changes to medical advance directive? No - Patient declined  Copy of [place] Agency in Chart? Yes  Would patient like information on creating a medical advance directive? -      Chief Complaint  Patient presents with  . Annual Exam    CPE  . Medical Management of Chronic Issues    HPI: Patient is a 71 y.o. male seen today for an annual physical exam.  He has just had his AWV with Clarise Cruz, RN last week.  He scored 29/30 on MMSE.  His tdap was sent to the pharmacy.  He is waiting for the pharmacy to have his second shingrix vaccine.  When I had seen him in May, he was doing well outside of a sinus infection and wanted to taper off his antidepressant.  However, in July, he saw NP [facility] for chest discomfort.  He had an ASD corrected in his 49s.  His records are in care everywhere.  He had chest discomfort which he reported in July started in March when he was mowing the grass--he also became sob.  He had to quit mowing and then it stopped.  He was taking baby asa only.  He had an abnormal EKG and murmur noted at that visit.  Had poor R wave progression and first degree block. He  was referred to cardiology, Dr. Rockey Situ.  He ordered a treadmill myoview.  Apparently, this was abnormal as cath was recommended for his unstable angina.  He said he was short-winded and felt terrible after the stres test.  HR was up to 158 on it.  He underwent cath on 9/26 which showed a proximal LAD lesion 30% stenosed, proximal circumflex lesion 50% stenosed, proximal RCA lesion 100% stenosed and LVEF 35-45% visually.  The following recommendations were made: Medical management, Asa, start metoprolol succinate 50 mg daily, continue simva  As outpt consider startiing ranexa 500 BID 1 week then 1000 BID (for refractory angina) Will try to wean down on Ca channel blocker as outpt Cardiac rehab, Consider evaluation by [place] intervention[facility] if having anginal sx.  He is feeling better.  He's doing the walking 4 times per week.  He still gets chest pains after 7-8 mins walking.  If he speeds up, the chest pains diminish.  He is thinking about stopping [facility]  [facility].  Has taken 3 ntg since he's had it.  One time, he was not sure if it was GERD or ntg.    He is very scared now b/c of this, too.  He has opted to continue the wellbutrin.    Mentioned entresto to him.  Not having heart failure symptoms.    He is still  using mobic for pain.  He can tell if he has not taken it.    Hyperlipidemia: at goal with simvastatin.  LDL 61, goal less than 70.  HDL only 47.  Discussed benefit of exercise on HDL.     hba1c is now in normal range.    He thinks he got his tdap already.  He's going to check with his pharmacist.    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 and a dermatology office.  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  Says turnip greens, crab seem to bring it on for him.   Has had OA of his right knee and right 4th MCP from trauma.  Past Medical History:  Diagnosis Date  . Coronary artery disease     a. 06/2017 MV: EF 45%, large inf and apical defect w/o ischemia;  b. 06/2017 Cath: LM nl, LAD 30ost, LCX 50p, OM1/2 ok, RCA 100p w/ L->R collats. EF 35%.  . Depression with anxiety   . Gastric ulcer, acute with hemorrhage 2001   Dr. Sharlet [place], [facility]  . H/O congenital atrial septal defect (ASD) repair   . Helicobacter pylori gastritis   . Heme positive stool   . History of gout   . History of peptic ulcer disease   . History of PSVT (paroxysmal supraventricular tachycardia)   . Hyperlipidemia   . Hypertension   . Ischemic cardiomyopathy    a. 06/2017 LV gram: EF 35%.  . Obstructive sleep apnea    on CPAP  . Perennial allergic rhinitis   . Tobacco abuse    Past Surgical History:  Procedure Laterality Date  . ASD REPAIR, OSTIUM PRIMUM  1966  . 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 open heart surgery  1966   Dr. Annamaria Boots ([facility]); was PFO repair--has been normal in f/u  . PALATE / UVULA BIOPSY / EXCISION  2000   Dr.Sprenhe [facility])  . TONSILLECTOMY AND ADENOIDECTOMY  1959    reports that he quit smoking about 1 years ago. He quit after 40.00 years of use. He has quit using smokeless tobacco. He reports that he does not drink alcohol or use drugs.  Functional Status Survey:    Family History  Problem Relation Age of Onset  . Failure to thrive Mother   . Arthritis Mother     Health Maintenance  Topic Date Due  . Hepatitis C Screening  December 02, 1945  . TETANUS/TDAP  07/31/1964  . COLONOSCOPY  03/22/2019  . INFLUENZA VACCINE  Completed  . PNA vac Low Risk Adult  Completed    Allergies  Allergen Reactions  . Pollen Extract Other (See Comments)    Stuffy nose/itchy throat (Seasonal allergies)    Outpatient Encounter Medications as of 08/16/2017  Medication Sig  . aspirin EC 81 MG tablet Take 81 mg by mouth daily.  Marland Kitchen buPROPion (WELLBUTRIN XL) 300 MG 24 hr tablet Take 300 mg by mouth daily.  . hydrochlorothiazide (HYDRODIURIL) 25 MG tablet Take one tablet by mouth  once daily  . isosorbide mononitrate (IMDUR) 30 MG 24 hr tablet Take 0.5 tablets (15 mg total) by mouth daily.  Marland Kitchen losartan (COZAAR) 25 MG tablet Take 1 tablet (25 mg total) by mouth daily.  . meloxicam (MOBIC) 15 MG tablet Take 1 tablet (15 mg total) by mouth as needed for pain.  . metoprolol succinate (TOPROL-XL) 100 MG 24 hr tablet Take 1 tablet (100 mg  total) by mouth daily.  . naphazoline-glycerin (CLEAR EYES) 0.012-0.2 % SOLN Place 1-2 drops into both eyes 4 (four) times daily as needed (for redness).  Marland Kitchen senna (SENOKOT) 8.6 MG tablet Take 2 tablets by mouth at bedtime.  . simvastatin (ZOCOR) 40 MG tablet Take one tablet by mouth once daily  . traZODone (DESYREL) 50 MG tablet TAKE 1 TABLET AT BEDTIME AS NEEDED  FOR  INSOMNIA  . nitroGLYCERIN (NITROSTAT) 0.4 MG SL tablet Place 1 tablet (0.4 mg total) under the tongue every 5 (five) minutes as needed for chest pain.   No facility-administered encounter medications on file as of 08/16/2017.     ROS  Vitals:   08/16/17 0857  BP: 110/68  Pulse: (!) 58  Temp: 98 F (36.7 C)  TempSrc: Oral  SpO2: 97%  Weight: 219 lb (99.3 kg)  Height: 6\' 1"  (1.854 m)   Body mass index is 28.89 kg/m. Physical Exam  Labs reviewed: Basic Metabolic Panel: Recent Labs    06/25/17 1351 08/04/17 0919 08/12/17 0913  NA 135 137 137  K 3.3* 4.0 4.2  CL 100* 97 102  CO2 24 26 29   GLUCOSE 117* 127* 108*  BUN 15 20 23   CREATININE 0.93 1.17 1.15  CALCIUM 9.2 9.3 8.9   Liver Function Tests: Recent Labs    08/12/17 0913  AST 16  ALT 16  BILITOT 0.5  PROT 6.2   No results for input(s): LIPASE, AMYLASE in the last 8760 hours. No results for input(s): AMMONIA in the last 8760 hours. CBC: Recent Labs    06/25/17 1351 08/12/17 0913  WBC 7.6 7.0  NEUTROABS 4.4 4,396  HGB 14.0 13.9  HCT 39.6* 39.6  MCV 89.9 89.8  PLT 233 233   Cardiac Enzymes: No results for input(s): CKTOTAL, CKMB, CKMBINDEX, TROPONINI in the last 8760  hours. BNP: Invalid input(s): POCBNP Lab Results  Component Value Date   HGBA1C 5.6 08/12/2017   No results found for: TSH No results found for: VITAMINB12 No results found for: FOLATE No results found for: IRON, TIBC, FERRITIN  Imaging and Procedures Recently: Reviewed stress test, cath and notes from NP here and cardiology  Assessment/Plan 1. Depression, major, in remission ([place]) -has opted to cont wellbutrin due to his stress recently from blocked RCA  2. Unstable angina (HCC) -continues with chest pain but less often now when exercising, remains shortlived each time, has used ntg 3x since he's had it -reviewed what to do in emergencies  3. Coronary artery disease involving native coronary artery of native heart with unstable angina pectoris ([place]) -encouraged continued cardiac rehab, but he may try to do on his own due to cost   4. Essential hypertension, benign - bp at goal with current regimen, cont same - COMPLETE METABOLIC PANEL WITH GFR; Future  5. Primary osteoarthritis of right knee -ongoing, counseled to use tylenol instead of mobic due to nsaid effects on CAD  6. Mixed hyperlipidemia - cont statin therapy, diet and exercise, has been at goal, working to increase HDL - CBC with Differential/Platelet; Future - COMPLETE METABOLIC PANEL WITH GFR; Future - Lipid panel; Future  7. Hyperglycemia - within normal range, but fasting glucose has remained elevated from normal - Hemoglobin A1c; Future  Labs/tests ordered:  Orders Placed This Encounter  Procedures  . CBC with Differential/Platelet    Standing Status:   Future    Standing Expiration Date:   04/15/2018  . COMPLETE METABOLIC PANEL WITH GFR    Standing Status:  Future    Standing Expiration Date:   04/15/2018  . Hemoglobin A1c    Standing Status:   Future    Standing Expiration Date:   04/15/2018  . Lipid panel    Standing Status:   Future    Standing Expiration Date:   04/15/2018   F/u in 4  mos.  Mayan Dolney L. Jashun Puertas, D.O. [facility] [address], [place] [address] Cell Phone (Mon-Fri 8am-5pm):  [phone] On Call:  [phone] & follow prompts after 5pm & weekends Office Phone:  [phone] Office Fax:  [phone]

## 2017-08-22 ENCOUNTER — Other Ambulatory Visit: Payer: Self-pay | Admitting: Internal Medicine

## 2017-08-23 ENCOUNTER — Telehealth: Payer: Self-pay | Admitting: Cardiovascular Disease

## 2017-08-23 ENCOUNTER — Telehealth: Payer: Self-pay

## 2017-08-23 NOTE — Telephone Encounter (Signed)
This is not on pt's medication list, last time it was ordered in 06/04/2017 it was done by cardiology. Should pt be on this med? Not on his list.

## 2017-08-23 NOTE — Telephone Encounter (Signed)
S/w pt who is currently in the dentist office. He will call back when he is able to discuss symptoms.

## 2017-08-23 NOTE — Progress Notes (Signed)
Discharge Instructions  Patient Details  Name: Mitchell Stephens. MRN: [id number] Date of Birth: 1946/02/06 Referring Provider:  No ref. provider found   Number of Visits: 12  Reason for Discharge:  Early Exit:  Personal  Smoking History:  Social History   Tobacco Use  Smoking Status Former Smoker  . Years: 40.00  . Last attempt to quit: 09/04/2015  . Years since quitting: 1.9  Smokeless Tobacco Former User    Diagnosis:  No diagnosis found.  Initial Exercise Prescription: Initial Exercise Prescription - 07/29/17 1400      Date of Initial Exercise RX and Referring Provider   Date  07/29/17    Referring Provider  [place]      Treadmill   MPH  2.4    Grade  0    Minutes  15    METs  2.8      Recumbant Bike   Level  3    RPM  60    Watts  28    Minutes  15    METs  2.8      T5 Nustep   Level  3    SPM  80    Minutes  15    METs  2.8      Prescription Details   Frequency (times per week)  3    Duration  Progress to 45 minutes of aerobic exercise without signs/symptoms of physical distress      Intensity   THRR 40-80% of Max Heartrate  97-131    Ratings of Perceived Exertion  11-13    Perceived Dyspnea  0-4      Resistance Training   Training Prescription  Yes    Weight  4 lb    Reps  10-15       Discharge Exercise Prescription (Final Exercise Prescription Changes): Exercise Prescription Changes - 08/18/17 1200      Response to Exercise   Blood Pressure (Admit)  100/70    Blood Pressure (Exercise)  142/66    Blood Pressure (Exit)  112/64    Heart Rate (Admit)  85 bpm    Heart Rate (Exercise)  95 bpm    Heart Rate (Exit)  78 bpm    Rating of Perceived Exertion (Exercise)  13    Symptoms  none    Duration  Progress to 45 minutes of aerobic exercise without signs/symptoms of physical distress    Intensity  THRR unchanged      Progression   Progression  Continue to progress workloads to maintain intensity without signs/symptoms of  physical distress.    Average METs  3.7      Resistance Training   Training Prescription  Yes    Weight  4 lb    Reps  10-15      Interval Training   Interval Training  No      Treadmill   MPH  2.6    Grade  1    Minutes  15    METs  3.83      T5 Nustep   Level  6    Minutes  15    METs  3.6      Home Exercise Plan   Plans to continue exercise at  [employer] [employer] (comment) [facility]  (possible early exit due to large copay)    Frequency  Add 2 additional days to program exercise sessions.    Initial Home Exercises Provided  08/05/17       Functional Capacity:  [place] Name 07/29/17 1431         6 Minute Walk   Distance  1365 feet     Walk Time  6 minutes     # of Rest Breaks  0     MPH  2.6     METS  2.88     RPE  10     VO2 Peak  10.07     Symptoms  No     Resting HR  3 bpm     Resting BP  102/60     Resting Oxygen Saturation   97 %     Exercise Oxygen Saturation  during 6 min walk  98 %     Max Ex. HR  111 bpm     Max Ex. BP  114/56     2 Minute Post BP  98/64        Quality of Life: Quality of Life - 07/29/17 1438      Quality of Life Scores   Health/Function Pre  20.38 %    Socioeconomic Pre  20 %    Psych/Spiritual Pre  20.29 %    Family Pre  20.5 %    GLOBAL Pre  20.32 %       Personal Goals: Goals established at orientation with interventions provided to work toward goal. Personal Goals and Risk Factors at Admission - 07/29/17 1420      Core Components/Risk Factors/Patient Goals on Admission    Weight Management  Yes;Weight Loss    Intervention  Obesity: Provide education and appropriate resources to help participant work on and attain dietary goals.;Weight Management/Obesity: Establish reasonable short term and long term weight goals.    Admit Weight  220 lb 4.8 oz (99.9 kg)    Expected Outcomes  Short Term: Continue to assess and modify interventions until short term weight is achieved;Long Term: Adherence to nutrition and  physical activity/exercise program aimed toward attainment of established weight goal;Weight Loss: Understanding of general recommendations for a balanced deficit meal plan, which promotes 1-2 lb weight loss per week and includes a negative energy balance of [phone] kcal/d;Understanding recommendations for meals to include 15-35% energy as protein, 25-35% energy from fat, 35-60% energy from carbohydrates, less than 200mg  of dietary cholesterol, 20-35 gm of total fiber daily;Understanding of distribution of calorie intake throughout the day with the consumption of 4-5 meals/snacks    Heart Failure  Yes    Intervention  Provide a combined exercise and nutrition program that is supplemented with education, support and counseling about heart failure. Directed toward relieving symptoms such as shortness of breath, decreased exercise tolerance, and extremity edema.    Expected Outcomes  Improve functional capacity of life;Short term: Attendance in program 2-3 days a week with increased exercise capacity. Reported lower sodium intake. Reported increased fruit and vegetable intake. Reports medication compliance.;Short term: Daily weights obtained and reported for increase. Utilizing diuretic protocols set by physician.;Long term: Adoption of self-care skills and reduction of barriers for early signs and symptoms recognition and intervention leading to self-care maintenance.    Hypertension  Yes    Intervention  Provide education on lifestyle modifcations including regular physical activity/exercise, weight management, moderate sodium restriction and increased consumption of fresh fruit, vegetables, and low fat dairy, alcohol moderation, and smoking cessation.;Monitor prescription use compliance.    Expected Outcomes  Short Term: Continued assessment and intervention until BP is < 140/18mm HG in hypertensive participants. < 130/62mm HG in  hypertensive participants with diabetes, heart failure or chronic kidney  disease.;Long Term: Maintenance of blood pressure at goal levels.    Lipids  Yes    Intervention  Provide education and support for participant on nutrition & aerobic/resistive exercise along with prescribed medications to achieve LDL 70mg , HDL >40mg .    Expected Outcomes  Short Term: Participant states understanding of desired cholesterol values and is compliant with medications prescribed. Participant is following exercise prescription and nutrition guidelines.;Long Term: Cholesterol controlled with medications as prescribed, with individualized exercise RX and with personalized nutrition plan. Value goals: LDL < 70mg , HDL > 40 mg.    Stress  Yes Financial, a lot has happened this past year with his health and his wife's. Insurance is proving to be difficult to deal with and is causing lots of stress    Intervention  Offer individual and/or small group education and counseling on adjustment to heart disease, stress management and health-related lifestyle change. Teach and support self-help strategies.;Refer participants experiencing significant psychosocial distress to appropriate mental health specialists for further evaluation and treatment. When possible, include family members and significant others in education/counseling sessions.    Expected Outcomes  Short Term: Participant demonstrates changes in health-related behavior, relaxation and other stress management skills, ability to obtain effective social support, and compliance with psychotropic medications if prescribed.;Long Term: Emotional wellbeing is indicated by absence of clinically significant psychosocial distress or social isolation.        Personal Goals Discharge:   Exercise Goals and Review: Exercise Goals    Row Name 07/29/17 1430             Exercise Goals   Increase Physical Activity  Yes       Intervention  Provide advice, education, support and counseling about physical activity/exercise needs.;Develop an  individualized exercise prescription for aerobic and resistive training based on initial evaluation findings, risk stratification, comorbidities and participant's personal goals.       Expected Outcomes  Achievement of increased cardiorespiratory fitness and enhanced flexibility, muscular endurance and strength shown through measurements of functional capacity and personal statement of participant.       Increase Strength and Stamina  Yes       Intervention  Provide advice, education, support and counseling about physical activity/exercise needs.;Develop an individualized exercise prescription for aerobic and resistive training based on initial evaluation findings, risk stratification, comorbidities and participant's personal goals.       Expected Outcomes  Achievement of increased cardiorespiratory fitness and enhanced flexibility, muscular endurance and strength shown through measurements of functional capacity and personal statement of participant.       Able to understand and use rate of perceived exertion (RPE) scale  Yes       Intervention  Provide education and explanation on how to use RPE scale       Expected Outcomes  Short Term: Able to use RPE daily in rehab to express subjective intensity level;Long Term:  Able to use RPE to guide intensity level when exercising independently       Able to understand and use Dyspnea scale  Yes       Intervention  Provide education and explanation on how to use Dyspnea scale       Expected Outcomes  Short Term: Able to use Dyspnea scale daily in rehab to express subjective sense of shortness of breath during exertion;Long Term: Able to use Dyspnea scale to guide intensity level when exercising independently       Knowledge and  understanding of Target Heart Rate Range (THRR)  Yes       Intervention  Provide education and explanation of THRR including how the numbers were predicted and where they are located for reference       Expected Outcomes  Short Term:  Able to state/look up THRR;Long Term: Able to use THRR to govern intensity when exercising independently;Short Term: Able to use daily as guideline for intensity in rehab       Able to check pulse independently  Yes       Intervention  Provide education and demonstration on how to check pulse in carotid and radial arteries.;Review the importance of being able to check your own pulse for safety during independent exercise       Expected Outcomes  Short Term: Able to explain why pulse checking is important during independent exercise;Long Term: Able to check pulse independently and accurately       Understanding of Exercise Prescription  Yes       Intervention  Provide education, explanation, and written materials on patient's individual exercise prescription       Expected Outcomes  Short Term: Able to explain program exercise prescription;Long Term: Able to explain home exercise prescription to exercise independently          Nutrition & Weight - Outcomes: Pre Biometrics - 07/29/17 1430      Pre Biometrics   Height  6' 0.5" (1.842 m)    Weight  220 lb 4.8 oz (99.9 kg)    Waist Circumference  41 inches    Hip Circumference  46 inches    Waist to Hip Ratio  0.89 %    BMI (Calculated)  29.45    Single Leg Stand  17.5 seconds        Nutrition:   Nutrition Discharge: Nutrition Assessments - 07/29/17 1431      MEDFICTS Scores   Pre Score  48       Education Questionnaire Score: Knowledge Questionnaire Score - 07/29/17 1439      Knowledge Questionnaire Score   Pre Score  24/28  Correct answers reviewed with Sam       Goals reviewed with patient; copy given to patient.

## 2017-08-23 NOTE — Telephone Encounter (Signed)
Returned a message from [place] that he plans to continue exercise on his own and wants to drop hearttrack class. He has enjoyed it but has a high copay and cant afford to continue.

## 2017-08-23 NOTE — Telephone Encounter (Signed)
Spoke with patient and he states that he woke up and heart rate was beating pretty fast. Blood pressures have been in normal range 118/67 last time he took it. So he feels that the betablocker's have helped and here recently he has had 3 different episodes where it feels fast. He estimates that it is in the 140 range. Advised for him to try and check it next time to get exact number and keep a log of that along with blood pressures. He has upcoming appointment and he will review with Dr. Rockey Situ at that time to determine plan. Reviewed that monitor might be ordered but Dr. Rockey Situ will determine at his visit. He was appreciative for the call back and had no further questions at this time. Confirmed upcoming appointment and reviewed signs and symptoms to monitor along with instructions to call back if they persist. He verbalized understanding with no further concerns at this time.

## 2017-08-23 NOTE — Telephone Encounter (Signed)
a 2-week monitor could be ordered for tachycardia Unable to exclude atrial fibrillation

## 2017-08-23 NOTE — Progress Notes (Signed)
Cardiac Individual Treatment Plan  Patient Details  Name: Mitchell Stephens. MRN: [id number] Date of Birth: 12-07-1945 Referring Provider:     Cardiac Rehab from 07/29/2017 in [facility] Cardiac and Pulmonary Rehab  Referring Provider  Gollan      Initial Encounter Date:    Cardiac Rehab from 07/29/2017 in [facility] Cardiac and Pulmonary Rehab  Date  07/29/17  Referring Provider  Rockey Situ      Visit Diagnosis: No diagnosis found.  Patient's Home Medications on Admission:  Current Outpatient Medications:  .  acetaminophen (TYLENOL) 500 MG tablet, Take 2 tablets (1,000 mg total) daily by mouth., Disp: 60 tablet, Rfl: 0 .  aspirin EC 81 MG tablet, Take 81 mg by mouth daily., Disp: , Rfl:  .  buPROPion (WELLBUTRIN XL) 300 MG 24 hr tablet, Take 300 mg by mouth daily., Disp: , Rfl:  .  hydrochlorothiazide (HYDRODIURIL) 25 MG tablet, Take one tablet by mouth once daily, Disp: 90 tablet, Rfl: 2 .  isosorbide mononitrate (IMDUR) 30 MG 24 hr tablet, Take 0.5 tablets (15 mg total) by mouth daily., Disp: 30 tablet, Rfl: 6 .  losartan (COZAAR) 25 MG tablet, Take 1 tablet (25 mg total) by mouth daily., Disp: 90 tablet, Rfl: 3 .  metoprolol succinate (TOPROL-XL) 100 MG 24 hr tablet, Take 1 tablet (100 mg total) by mouth daily., Disp: 90 tablet, Rfl: 3 .  naphazoline-glycerin (CLEAR EYES) 0.012-0.2 % SOLN, Place 1-2 drops into both eyes 4 (four) times daily as needed (for redness)., Disp: , Rfl:  .  nitroGLYCERIN (NITROSTAT) 0.4 MG SL tablet, Place 1 tablet (0.4 mg total) under the tongue every 5 (five) minutes as needed for chest pain., Disp: 25 tablet, Rfl: 6 .  senna (SENOKOT) 8.6 MG tablet, Take 2 tablets by mouth at bedtime., Disp: , Rfl:  .  simvastatin (ZOCOR) 40 MG tablet, TAKE ONE TABLET BY MOUTH ONCE DAILY, Disp: 90 tablet, Rfl: 2 .  traZODone (DESYREL) 50 MG tablet, TAKE 1 TABLET AT BEDTIME AS NEEDED FOR INSOMNIA, Disp: 90 tablet, Rfl: 2  Past Medical History: Past Medical History:   Diagnosis Date  . Coronary artery disease    a. 06/2017 MV: EF 45%, large inf and apical defect w/o ischemia;  b. 06/2017 Cath: LM nl, LAD 30ost, LCX 50p, OM1/2 ok, RCA 100p w/ L->R collats. EF 35%.  . Depression with anxiety   . Gastric ulcer, acute with hemorrhage 2001   Dr. Sharlet Salina, [facility]  . H/O congenital atrial septal defect (ASD) repair   . Helicobacter pylori gastritis   . Heme positive stool   . History of gout   . History of peptic ulcer disease    had bleeding ulcers requiring transfusion  . History of PSVT (paroxysmal supraventricular tachycardia)   . Hyperlipidemia   . Hypertension   . Ischemic cardiomyopathy    a. 06/2017 LV gram: EF 35%.  . Obstructive sleep apnea    on CPAP  . Perennial allergic rhinitis   . Tobacco abuse     Tobacco Use: Social History   Tobacco Use  Smoking Status Former Smoker  . Years: 40.00  . Last attempt to quit: 09/04/2015  . 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 Participant demonstrates ability to understand and report RPE using BORG scale, to self-measure pulse accurately, and to acknowledge the importance of the exercise prescription.  Exercise Prescription Goal: Starting with aerobic activity 30 plus minutes a day, 3 days per week for initial exercise prescription. Provide home exercise prescription and guidelines that participant acknowledges understanding prior to discharge.  Activity Barriers & Risk  Stratification: Activity Barriers & Cardiac Risk Stratification - 07/29/17 1441      Activity Barriers & Cardiac Risk Stratification   Activity Barriers  None    Cardiac Risk Stratification  High       6 Minute Walk: 6 Minute Walk    Row Name 07/29/17 1431         6 Minute Walk   Distance  1365 feet     Walk Time  6 minutes     # of Rest Breaks  0     MPH  2.6     METS  2.88     RPE  10     VO2 Peak  10.07     Symptoms  No     Resting HR  3 bpm     Resting BP  102/60     Resting Oxygen Saturation   97 %     Exercise Oxygen Saturation  during 6 min walk  98 %     Max Ex. HR  111 bpm     Max Ex. BP  114/56     2 Minute Post BP  98/64        Oxygen Initial Assessment:   Oxygen Re-Evaluation:   Oxygen Discharge (Final Oxygen Re-Evaluation):   Initial Exercise Prescription: Initial Exercise Prescription - 07/29/17 1400      Date of Initial Exercise RX and Referring Provider   Date  07/29/17    Referring Provider  Gollan      Treadmill   MPH  2.4    Grade  0    Minutes  15    METs  2.8      Recumbant Bike   Level  3    RPM  60    Watts  28    Minutes  15    METs  2.8      T5 Nustep   Level  3    SPM  80    Minutes  15    METs  2.8      Prescription Details   Frequency (times per week)  3    Duration  Progress to 45 minutes of aerobic exercise without signs/symptoms of physical distress      Intensity   THRR 40-80% of Max Heartrate  97-131    Ratings of Perceived Exertion  11-13    Perceived Dyspnea  0-4      Resistance Training   Training Prescription  Yes    Weight  4 lb    Reps  10-15       Perform Capillary Blood Glucose checks as needed.  Exercise Prescription Changes: Exercise Prescription Changes    Row Name 07/29/17 1400 08/04/17 1200 08/05/17 1600 08/18/17 1200       Response  to Exercise   Blood Pressure (Admit)  102/60  104/70  -  100/70    Blood Pressure (Exercise)  114/56  122/56  -  142/66    Blood Pressure (Exit)   98/64  116/60  -  112/64    Heart Rate (Admit)  61 bpm  81 bpm  -  85 bpm    Heart Rate (Exercise)  111 bpm  94 bpm  -  95 bpm    Heart Rate (Exit)  70 bpm  67 bpm  -  78 bpm    Oxygen Saturation (Admit)  99 %  -  -  -    Rating of Perceived Exertion (Exercise)  10  11  -  13    Symptoms  -  none  -  none    Duration  -  Progress to 45 minutes of aerobic exercise without signs/symptoms of physical distress  Progress to 45 minutes of aerobic exercise without signs/symptoms of physical distress  Progress to 45 minutes of aerobic exercise without signs/symptoms of physical distress    Intensity  -  THRR unchanged  THRR unchanged  THRR unchanged      Progression   Progression  -  Continue to progress workloads to maintain intensity without signs/symptoms of physical distress.  Continue to progress workloads to maintain intensity without signs/symptoms of physical distress.  Continue to progress workloads to maintain intensity without signs/symptoms of physical distress.    Average METs  -  2.8  2.8  3.7      Resistance Training   Training Prescription  -  Yes  Yes  Yes    Weight  -  4 lb  4 lb  4 lb    Reps  -  10-15  10-15  10-15      Interval Training   Interval Training  -  No  No  No      Treadmill   MPH  -  -  -  2.6    Grade  -  -  -  1    Minutes  -  -  -  15    METs  -  -  -  3.83      Recumbant Bike   Level  -  3  3  -    Watts  -  26  26  -    Minutes  -  15  15  -    METs  -  2.8  2.8  -      T5 Nustep   Level  -  '3  3  6    ' Minutes  -  '15  15  15    ' METs  -  -  -  3.6      Home Exercise Plan   Plans to continue exercise at  -  -  [employer] (comment) [facility]  (possible early exit due to large copay)  [employer] [facility]  (possible early exit due to large copay)    Frequency  -  -  Add 2 additional days to program exercise sessions.  Add 2 additional days to program exercise sessions.    Initial Home Exercises Provided  -  -  08/05/17  08/05/17        Exercise Comments: Exercise Comments    Row Name 08/02/17 1734           Exercise Comments  First full day of exercise!  Patient was  oriented to gym and equipment including functions, settings, policies, and procedures.  Patient's individual exercise prescription and treatment plan were reviewed.  All starting workloads were established based on the results of the 6 minute walk test done at initial orientation visit.  The plan for exercise progression was also introduced and progression will be customized based on patient's performance and goals.          Exercise Goals and Review: Exercise Goals    Row Name 07/29/17 1430             Exercise Goals   Increase Physical Activity  Yes       Intervention  Provide advice, education, support and counseling about physical activity/exercise needs.;Develop an individualized exercise prescription for aerobic and resistive training based on initial evaluation findings, risk stratification, comorbidities and participant's personal goals.       Expected Outcomes  Achievement of increased cardiorespiratory fitness and enhanced flexibility, muscular endurance and strength shown through measurements of functional capacity and personal statement of participant.       Increase Strength and Stamina  Yes       Intervention  Provide advice, education, support and counseling about physical activity/exercise needs.;Develop an individualized exercise prescription for aerobic and resistive training based on initial evaluation findings, risk stratification, comorbidities and participant's personal goals.       Expected Outcomes  Achievement of increased cardiorespiratory fitness and enhanced flexibility, muscular endurance and strength shown through measurements of functional capacity and personal statement of participant.       Able to understand and use rate of perceived exertion (RPE) scale  Yes       Intervention  Provide education and explanation on how  to use RPE scale       Expected Outcomes  Short Term: Able to use RPE daily in rehab to express subjective intensity level;Long Term:  Able to use RPE to guide intensity level when exercising independently       Able to understand and use Dyspnea scale  Yes       Intervention  Provide education and explanation on how to use Dyspnea scale       Expected Outcomes  Short Term: Able to use Dyspnea scale daily in rehab to express subjective sense of shortness of breath during exertion;Long Term: Able to use Dyspnea scale to guide intensity level when exercising independently       Knowledge and understanding of Target Heart Rate Range (THRR)  Yes       Intervention  Provide education and explanation of THRR including how the numbers were predicted and where they are located for reference       Expected Outcomes  Short Term: Able to state/look up THRR;Long Term: Able to use THRR to govern intensity when exercising independently;Short Term: Able to use daily as guideline for intensity in rehab       Able to check pulse independently  Yes       Intervention  Provide education and demonstration on how to check pulse in carotid and radial arteries.;Review the importance of being able to check your own pulse for safety during independent exercise       Expected Outcomes  Short Term: Able to explain why pulse checking is important during independent exercise;Long Term: Able to check pulse independently and accurately       Understanding of Exercise Prescription  Yes       Intervention  Provide education, explanation, and written materials on patient's individual exercise  prescription       Expected Outcomes  Short Term: Able to explain program exercise prescription;Long Term: Able to explain home exercise prescription to exercise independently          Exercise Goals Re-Evaluation : Exercise Goals Re-Evaluation    Row Name 08/02/17 1735 08/05/17 1656 08/18/17 1231         Exercise Goal Re-Evaluation    Exercise Goals Review  Increase Physical Activity;Increase Strength and Stamina  Increase Physical Activity;Increase Strength and Stamina;Knowledge and understanding of Target Heart Rate Range (THRR);Able to understand and use rate of perceived exertion (RPE) scale;Able to check pulse independently;Understanding of Exercise Prescription  Increase Physical Activity;Able to understand and use rate of perceived exertion (RPE) scale;Increase Strength and Stamina;Able to check pulse independently;Knowledge and understanding of Target Heart Rate Range (THRR);Understanding of Exercise Prescription     Comments  Reviewed RPE scale, THR and program prescription with pt today.  Pt voiced understanding and was given a copy of goals to take home.   Home exercise guidelines reviewed with patient. He verbalized understanding of these guidelines.   Mitchell Stephens is progressing well .  he will follow up with his Dr as to how long he will stay in [facility].     Expected Outcomes  Short: Use RPE daily to regulate intensity.  Long: Follow program prescription in THR  Short: Add 1-2 days of exercise outside of class. Start interval training before possible early exit (due to copay) Long: Safely and independently exercise at community facility.   Short - Mitchell Stephens will continue to improve while in [facility].  Long - Mitchell Stephens will exercise on his own 3-5 days per week.        Discharge Exercise Prescription (Final Exercise Prescription Changes): Exercise Prescription Changes - 08/18/17 1200      Response to Exercise   Blood Pressure (Admit)  100/70    Blood Pressure (Exercise)  142/66    Blood Pressure (Exit)  112/64    Heart Rate (Admit)  85 bpm    Heart Rate (Exercise)  95 bpm    Heart Rate (Exit)  78 bpm    Rating of Perceived Exertion (Exercise)  13    Symptoms  none    Duration  Progress to 45 minutes of aerobic exercise without signs/symptoms of physical distress    Intensity  THRR unchanged      Progression   Progression  Continue to progress  workloads to maintain intensity without signs/symptoms of physical distress.    Average METs  3.7      Resistance Training   Training Prescription  Yes    Weight  4 lb    Reps  10-15      Interval Training   Interval Training  No      Treadmill   MPH  2.6    Grade  1    Minutes  15    METs  3.83      T5 Nustep   Level  6    Minutes  15    METs  3.6      Home Exercise Plan   Plans to continue exercise at  [employer] (comment) [employer]  (possible early exit due to large copay)    Frequency  Add 2 additional days to program exercise sessions.    Initial Home Exercises Provided  08/05/17       Nutrition:  Target Goals: Understanding of nutrition guidelines, daily intake of sodium <1511m, cholesterol <2065m calories 30% from fat  and 7% or less from saturated fats, daily to have 5 or more servings of fruits and vegetables.  Biometrics: Pre Biometrics - 07/29/17 1430      Pre Biometrics   Height  6' 0.5" (1.842 m)    Weight  220 lb 4.8 oz (99.9 kg)    Waist Circumference  41 inches    Hip Circumference  46 inches    Waist to Hip Ratio  0.89 %    BMI (Calculated)  29.45    Single Leg Stand  17.5 seconds        Nutrition Therapy Plan and Nutrition Goals:   Nutrition Discharge: Rate Your Plate Scores: Nutrition Assessments - 07/29/17 1431      MEDFICTS Scores   Pre Score  48       Nutrition Goals Re-Evaluation:   Nutrition Goals Discharge (Final Nutrition Goals Re-Evaluation):   Psychosocial: Target Goals: Acknowledge presence or absence of significant depression and/or stress, maximize coping skills, provide positive support system. Participant is able to verbalize types and ability to use techniques and skills needed for reducing stress and depression.   Initial Review & Psychosocial Screening: Initial Psych Review & Screening - 07/29/17 1438      Initial Review   Current issues with  History of Depression      Family Dynamics   Good Support  System?  Yes      Screening Interventions   Interventions  Yes;Encouraged to exercise;Program counselor consult    Expected Outcomes  Short Term goal: Utilizing psychosocial counselor, staff and physician to assist with identification of specific Stressors or current issues interfering with healing process. Setting desired goal for each stressor or current issue identified.;Long Term Goal: Stressors or current issues are controlled or eliminated.;Short Term goal: Identification and review with participant of any Quality of Life or Depression concerns found by scoring the questionnaire.;Long Term goal: The participant improves quality of Life and PHQ9 Scores as seen by post scores and/or verbalization of changes       Quality of Life Scores:  Quality of Life - 07/29/17 1438      Quality of Life Scores   Health/Function Pre  20.38 %    Socioeconomic Pre  20 %    Psych/Spiritual Pre  20.29 %    Family Pre  20.5 %    GLOBAL Pre  20.32 %       PHQ-9: Recent Review Flowsheet Data    Depression screen [facility] 2/9 08/12/2017 07/29/2017 02/11/2017 08/07/2016 05/09/2015   Decreased Interest 0 2 0 0 0   Down, Depressed, Hopeless 1 1 0 0 0   PHQ - 2 Score 1 3 0 0 0   Altered sleeping - 0 - - -   Tired, decreased energy - 1 - - -   Change in appetite - 2 - - -   Feeling bad or failure about yourself  - 0 - - -   Trouble concentrating - 0 - - -   Moving slowly or fidgety/restless - 1 - - -   Suicidal thoughts - 0 - - -   PHQ-9 Score - 7 - - -   Difficult doing work/chores - Not difficult at all - - -     Interpretation of Total Score  Total Score Depression Severity:  1-4 = Minimal depression, 5-9 = Mild depression, 10-14 = Moderate depression, 15-19 = Moderately severe depression, 20-27 = Severe depression   Psychosocial Evaluation and Intervention: Psychosocial Evaluation - 08/02/17 1706  Psychosocial Evaluation & Interventions   Interventions  Encouraged to exercise with the program and  follow exercise prescription;Relaxation education    Comments  Counselor met with Mr. Larkey (Mitchell Stephens) today for initial psychosocial evaluation.  He turned 71 years old this past Saturday.  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  Mitchell Stephens has a strong support system with a spouse of 33 years; a daughter and a sister who live close by; and he is actively involved in his local church.  Mitchell Stephens reports being in fairly good health other than his heart; sleeps well and has a good appetite.  Mitchell Stephens admits to a history of depression 7 years ago; but has been in a more positive mood until this last cardiac event.  He admits to mild depressive symptoms currently as he is adjusting to this news of being inoperable.  He has had a stressful past year with a spouse who has had (2) surgeries and now his own health.  Mitchell Stephens is also concerned about finances with a very high co-pay in this program - so he doesn't think he will be able to complete it.  He would like a baseline before he is released to know what to expect in exercising on his own with his heart condition.  He is a member at the [employer] and plans to exercise there 3-5 times/week once he leaves this program.  Counselor provided Mitchell Stephens some depression education and resources if needed in the future.      Expected Outcomes  Mitchell Stephens will benefit from consistent exercise to achieve his stated goals to get a "baseline" before released here.  He also will benefit from counseling resources if his depressive symptoms worsen - counselor provided info on this for Mitchell Stephens.         Psychosocial Re-Evaluation:   Psychosocial Discharge (Final Psychosocial Re-Evaluation):   Vocational Rehabilitation: Provide vocational rehab assistance to qualifying candidates.   Vocational Rehab Evaluation & Intervention: Vocational Rehab - 07/29/17 1440      Initial Vocational Rehab Evaluation & Intervention    Assessment shows need for Vocational Rehabilitation  No       Education: Education Goals: Education classes will be provided on a variety of topics geared toward better understanding of heart health and risk factor modification. Participant will state understanding/return demonstration of topics presented as noted by education test scores.  Learning Barriers/Preferences: Learning Barriers/Preferences - 07/29/17 1439      Learning Barriers/Preferences   Learning Barriers  None    Learning Preferences  Verbal Instruction       Education Topics: General Nutrition Guidelines/Fats and Fiber: -Group instruction provided by verbal, written material, models and posters to present the general guidelines for heart healthy nutrition. Gives an explanation and review of dietary fats and fiber.   Controlling Sodium/Reading Food Labels: -Group verbal and written material supporting the discussion of sodium use in heart healthy nutrition. Review and explanation with models, verbal and written materials for utilization of the food label.   Exercise Physiology & Risk Factors: - Group verbal and written instruction with models to review the exercise physiology of the cardiovascular system and associated critical values. Details cardiovascular disease risk factors and the goals associated with each risk factor.   Cardiac Rehab from 08/16/2017 in [facility] Cardiac and Pulmonary Rehab  Date  08/02/17  Educator  [facility]  Instruction Review Code  1- Verbalizes Understanding  Aerobic Exercise & Resistance Training: - Gives group verbal and written discussion on the health impact of inactivity. On the components of aerobic and resistive training programs and the benefits of this training and how to safely progress through these programs.   Cardiac Rehab from 08/16/2017 in [facility] Cardiac and Pulmonary Rehab  Date  08/04/17  Educator  [facility]  Instruction Review Code  1- Verbalizes Understanding       Flexibility, Balance, General Exercise Guidelines: - Provides group verbal and written instruction on the benefits of flexibility and balance training programs. Provides general exercise guidelines with specific guidelines to those with heart or lung disease. Demonstration and skill practice provided.   Cardiac Rehab from 08/16/2017 in [facility] Cardiac and Pulmonary Rehab  Date  08/09/17  Educator  [facility]  Instruction Review Code  1- Verbalizes Understanding      Stress Management: - Provides group verbal and written instruction about the health risks of elevated stress, cause of high stress, and healthy ways to reduce stress.   Depression: - Provides group verbal and written instruction on the correlation between heart/lung disease and depressed mood, treatment options, and the stigmas associated with seeking treatment.   Anatomy & Physiology of the Heart: - Group verbal and written instruction and models provide basic cardiac anatomy and physiology, with the coronary electrical and arterial systems. Review of: AMI, Angina, Valve disease, Heart Failure, Cardiac Arrhythmia, Pacemakers, and the ICD.   Cardiac Rehab from 08/16/2017 in [facility] Cardiac and Pulmonary Rehab  Date  08/16/17  Educator  [facility]  Instruction Review Code  1- Verbalizes Understanding      Cardiac Procedures: - Group verbal and written instruction to review commonly prescribed medications for heart disease. Reviews the medication, class of the drug, and side effects. Includes the steps to properly store meds and maintain the prescription regimen. (beta blockers and nitrates)   Cardiac Medications I: - Group verbal and written instruction to review commonly prescribed medications for heart disease. Reviews the medication, class of the drug, and side effects. Includes the steps to properly store meds and maintain the prescription regimen.   Cardiac Medications II: -Group verbal and written instruction to review commonly  prescribed medications for heart disease. Reviews the medication, class of the drug, and side effects. (all other drug classes)    Go Sex-Intimacy & Heart Disease, Get SMART - Goal Setting: - Group verbal and written instruction through game format to discuss heart disease and the return to sexual intimacy. Provides group verbal and written material to discuss and apply goal setting through the application of the S.M.A.R.T. Method.   Other Matters of the Heart: - Provides group verbal, written materials and models to describe Heart Failure, Angina, Valve Disease, Peripheral Artery Disease, and Diabetes in the realm of heart disease. Includes description of the disease process and treatment options available to the cardiac patient.   Cardiac Rehab from 08/16/2017 in [place] [facility]  Date  08/16/17  Educator  [facility]  Instruction Review Code  1- Verbalizes Understanding      Exercise & Equipment Safety: - Individual verbal instruction and demonstration of equipment use and safety with use of the equipment.   Cardiac Rehab from 08/16/2017 in [facility] Cardiac and Pulmonary Rehab  Date  07/29/17  Educator  [facility]  Instruction Review Code  1- Verbalizes Understanding      Infection Prevention: - Provides verbal and written material to individual with discussion of infection control including proper hand washing and proper equipment  cleaning during exercise session.   Cardiac Rehab from 08/16/2017 in [facility] Cardiac and Pulmonary Rehab  Date  07/29/17  Educator  [facility]  Instruction Review Code  1- Verbalizes Understanding      Falls Prevention: - Provides verbal and written material to individual with discussion of falls prevention and safety.   Cardiac Rehab from 08/16/2017 in [facility] Cardiac and Pulmonary Rehab  Date  07/29/17  Educator  [facility]  Instruction Review Code  1- Verbalizes Understanding      Diabetes: - Individual verbal and written instruction to review signs/symptoms of  diabetes, desired ranges of glucose level fasting, after meals and with exercise. Acknowledge that pre and post exercise glucose checks will be done for 3 sessions at entry of program.   Other: -Provides group and verbal instruction on various topics (see comments)   Card[facility] Rehab [facility]/16/2017 [facility]  Date  08/11/17 [Know your numbers and Risk factors]  Educator  [facility]  Instruction Review Code  1- Verbalizes Understanding       Knowledge Questionnaire Score: Knowledge Questionnaire Score - 07/29/17 1439      Knowledge Questionnaire Score   Pre Score  24/28  Correct answers reviewed with Mitchell Stephens       Core Components/Risk Factors/Patient Goals at Admission: Personal Goals and Risk Factors at Admission - 07/29/17 1420      Core Components/Risk Factors/Patient Goals on Admission    Weight Management  Yes;Weight Loss    Intervention  Obesity: Provide education and appropriate resources to help participant work on and attain dietary goals.;Weight Management/Obesity: Establish reasonable short term and long term weight goals.    Admit Weight  220 lb 4.8 oz (99.9 kg)    Expected Outcomes  Short Term: Continue to assess and modify interventions until short term weight is achieved;Long Term: Adherence to nutrition and physical activity/exercise program aimed toward attainment of established weight goal;Weight Loss: Understanding of general recommendations for a balanced deficit meal plan, which promotes 1-2 lb weight loss per week and includes a negative energy balance of [phone] kcal/d;Understanding recommendations for meals to include 15-35% energy as protein, 25-35% energy from fat, 35-60% energy from carbohydrates, less than 267m of dietary cholesterol, 20-35 gm of total fiber daily;Understanding of distribution of calorie intake throughout the day with the consumption of 4-5 meals/snacks    Heart Failure  Yes    Intervention  Provide a combined exercise and  nutrition program that is supplemented with education, support and counseling about heart failure. Directed toward relieving symptoms such as shortness of breath, decreased exercise tolerance, and extremity edema.    Expected Outcomes  Improve functional capacity of life;Short term: Attendance in program 2-3 days a week with increased exercise capacity. Reported lower sodium intake. Reported increased fruit and vegetable intake. Reports medication compliance.;Short term: Daily weights obtained and reported for increase. Utilizing diuretic protocols set by physician.;Long term: Adoption of self-care skills and reduction of barriers for early signs and symptoms recognition and intervention leading to self-care maintenance.    Hypertension  Yes    Intervention  Provide education on lifestyle modifcations including regular physical activity/exercise, weight management, moderate sodium restriction and increased consumption of fresh fruit, vegetables, and low fat dairy, alcohol moderation, and smoking cessation.;Monitor prescription use compliance.    Expected Outcomes  Short Term: Continued assessment and intervention until BP is < 140/972mHG in hypertensive participants. < 130/8042mG in hypertensive participants with diabetes, heart failure or chronic kidney disease.;Long Term: Maintenance of blood  pressure at goal levels.    Lipids  Yes    Intervention  Provide education and support for participant on nutrition & aerobic/resistive exercise along with prescribed medications to achieve LDL <61m, HDL >460m    Expected Outcomes  Short Term: Participant states understanding of desired cholesterol values and is compliant with medications prescribed. Participant is following exercise prescription and nutrition guidelines.;Long Term: Cholesterol controlled with medications as prescribed, with individualized exercise RX and with personalized nutrition plan. Value goals: LDL < 7060mHDL > 40 mg.    Stress  Yes  Financial, a lot has happened this past year with his health and his wife's. Insurance is proving to be difficult to deal with and is causing lots of stress    Intervention  Offer individual and/or small group education and counseling on adjustment to heart disease, stress management and health-related lifestyle change. Teach and support self-help strategies.;Refer participants experiencing significant psychosocial distress to appropriate mental health specialists for further evaluation and treatment. When possible, include family members and significant others in education/counseling sessions.    Expected Outcomes  Short Term: Participant demonstrates changes in health-related behavior, relaxation and other stress management skills, ability to obtain effective social support, and compliance with psychotropic medications if prescribed.;Long Term: Emotional wellbeing is indicated by absence of clinically significant psychosocial distress or social isolation.       Core Components/Risk Factors/Patient Goals Review:    Core Components/Risk Factors/Patient Goals at Discharge (Final Review):    ITP Comments: ITP Comments    Row Name 07/29/17 1416 08/11/17 0601         ITP Comments  Med Review completed. Initial ITP created. [facility]  30 day review. Continue with ITP unless directed changes per Medical Director review.  New to program         Comments: discharge ITP

## 2017-08-23 NOTE — Telephone Encounter (Signed)
Pt calling stating last night he woke up to rapid heartbeat He states he is worried for he is on beta blockers and thought that was to prevent that  Would like some advise on this

## 2017-08-23 NOTE — Progress Notes (Signed)
Discharge Progress Report  Patient Details  Name: Mitchell Stephens. MRN: [id number] Date of Birth: 1946-09-11 Referring Provider:     Cardiac Rehab from 07/29/2017 in [facility] Cardiac and Pulmonary Rehab  Referring Provider  Gollan       Number of Visits: 12  Reason for Discharge:  Early Exit:  Personal  Smoking History:  Social History   Tobacco Use  Smoking Status Former Smoker  . Years: 40.00  . Last attempt to quit: 09/04/2015  . Years since quitting: 1.9  Smokeless Tobacco Former User    Diagnosis:  No diagnosis found.  ADL UCSD:   Initial Exercise Prescription: Initial Exercise Prescription - 07/29/17 1400      Date of Initial Exercise RX and Referring Provider   Date  07/29/17    Referring Provider  Gollan      Treadmill   MPH  2.4    Grade  0    Minutes  15    METs  2.8      Recumbant Bike   Level  3    RPM  60    Watts  28    Minutes  15    METs  2.8      T5 Nustep   Level  3    SPM  80    Minutes  15    METs  2.8      Prescription Details   Frequency (times per week)  3    Duration  Progress to 45 minutes of aerobic exercise without signs/symptoms of physical distress      Intensity   THRR 40-80% of Max Heartrate  97-131    Ratings of Perceived Exertion  11-13    Perceived Dyspnea  0-4      Resistance Training   Training Prescription  Yes    Weight  4 lb    Reps  10-15       Discharge Exercise Prescription (Final Exercise Prescription Changes): Exercise Prescription Changes - 08/18/17 1200      Response to Exercise   Blood Pressure (Admit)  100/70    Blood Pressure (Exercise)  142/66    Blood Pressure (Exit)  112/64    Heart Rate (Admit)  85 bpm    Heart Rate (Exercise)  95 bpm    Heart Rate (Exit)  78 bpm    Rating of Perceived Exertion (Exercise)  13    Symptoms  none    Duration  Progress to 45 minutes of aerobic exercise without signs/symptoms of physical distress    Intensity  THRR unchanged      Progression    Progression  Continue to progress workloads to maintain intensity without signs/symptoms of physical distress.    Average METs  3.7      Resistance Training   Training Prescription  Yes    Weight  4 lb    Reps  10-15      Interval Training   Interval Training  No      Treadmill   MPH  2.6    Grade  1    Minutes  15    METs  3.83      T5 Nustep   Level  6    Minutes  15    METs  3.6      Home Exercise Plan   Plans to continue exercise at  [employer] (comment) [employer]  (possible early exit due to large copay)    Frequency  Add 2  additional days to program exercise sessions.    Initial Home Exercises Provided  08/05/17       Functional Capacity: 6 Minute Walk    Row Name 07/29/17 1431         6 Minute Walk   Distance  1365 feet     Walk Time  6 minutes     # of Rest Breaks  0     MPH  2.6     METS  2.88     RPE  10     VO2 Peak  10.07     Symptoms  No     Resting HR  3 bpm     Resting BP  102/60     Resting Oxygen Saturation   97 %     Exercise Oxygen Saturation  during 6 min walk  98 %     Max Ex. HR  111 bpm     Max Ex. BP  114/56     2 Minute Post BP  98/64        Psychological, QOL, Others - Outcomes: PHQ 2/9: Depression screen [facility] 2/9 08/12/2017 07/29/2017 02/11/2017 08/07/2016 05/09/2015  Decreased Interest 0 2 0 0 0  Down, Depressed, Hopeless 1 1 0 0 0  PHQ - 2 Score 1 3 0 0 0  Altered sleeping - 0 - - -  Tired, decreased energy - 1 - - -  Change in appetite - 2 - - -  Feeling bad or failure about yourself  - 0 - - -  Trouble concentrating - 0 - - -  Moving slowly or fidgety/restless - 1 - - -  Suicidal thoughts - 0 - - -  PHQ-9 Score - 7 - - -  Difficult doing work/chores - Not difficult at all - - -    Quality of Life: Quality of Life - 07/29/17 1438      Quality of Life Scores   Health/Function Pre  20.38 %    Socioeconomic Pre  20 %    Psych/Spiritual Pre  20.29 %    Family Pre  20.5 %    GLOBAL Pre  20.32 %       Personal  Goals: Goals established at orientation with interventions provided to work toward goal. Personal Goals and Risk Factors at Admission - 07/29/17 1420      Core Components/Risk Factors/Patient Goals on Admission    Weight Management  Yes;Weight Loss    Intervention  Obesity: Provide education and appropriate resources to help participant work on and attain dietary goals.;Weight Management/Obesity: Establish reasonable short term and long term weight goals.    Admit Weight  220 lb 4.8 oz (99.9 kg)    Expected Outcomes  Short Term: Continue to assess and modify interventions until short term weight is achieved;Long Term: Adherence to nutrition and physical activity/exercise program aimed toward attainment of established weight goal;Weight Loss: Understanding of general recommendations for a balanced deficit meal plan, which promotes 1-2 lb weight loss per week and includes a negative energy balance of [phone] kcal/d;Understanding recommendations for meals to include 15-35% energy as protein, 25-35% energy from fat, 35-60% energy from carbohydrates, less than 200mg  of dietary cholesterol, 20-35 gm of total fiber daily;Understanding of distribution of calorie intake throughout the day with the consumption of 4-5 meals/snacks    Heart Failure  Yes    Intervention  Provide a combined exercise and nutrition program that is supplemented with education, support and counseling about heart failure. Directed  toward relieving symptoms such as shortness of breath, decreased exercise tolerance, and extremity edema.    Expected Outcomes  Improve functional capacity of life;Short term: Attendance in program 2-3 days a week with increased exercise capacity. Reported lower sodium intake. Reported increased fruit and vegetable intake. Reports medication compliance.;Short term: Daily weights obtained and reported for increase. Utilizing diuretic protocols set by physician.;Long term: Adoption of self-care skills and reduction  of barriers for early signs and symptoms recognition and intervention leading to self-care maintenance.    Hypertension  Yes    Intervention  Provide education on lifestyle modifcations including regular physical activity/exercise, weight management, moderate sodium restriction and increased consumption of fresh fruit, vegetables, and low fat dairy, alcohol moderation, and smoking cessation.;Monitor prescription use compliance.    Expected Outcomes  Short Term: Continued assessment and intervention until BP is < 140/72mm HG in hypertensive participants. < 130/75mm HG in hypertensive participants with diabetes, heart failure or chronic kidney disease.;Long Term: Maintenance of blood pressure at goal levels.    Lipids  Yes    Intervention  Provide education and support for participant on nutrition & aerobic/resistive exercise along with prescribed medications to achieve LDL 70mg , HDL >40mg .    Expected Outcomes  Short Term: Participant states understanding of desired cholesterol values and is compliant with medications prescribed. Participant is following exercise prescription and nutrition guidelines.;Long Term: Cholesterol controlled with medications as prescribed, with individualized exercise RX and with personalized nutrition plan. Value goals: LDL < 70mg , HDL > 40 mg.    Stress  Yes Financial, a lot has happened this past year with his health and his wife's. Insurance is proving to be difficult to deal with and is causing lots of stress    Intervention  Offer individual and/or small group education and counseling on adjustment to heart disease, stress management and health-related lifestyle change. Teach and support self-help strategies.;Refer participants experiencing significant psychosocial distress to appropriate mental health specialists for further evaluation and treatment. When possible, include family members and significant others in education/counseling sessions.    Expected Outcomes  Short  Term: Participant demonstrates changes in health-related behavior, relaxation and other stress management skills, ability to obtain effective social support, and compliance with psychotropic medications if prescribed.;Long Term: Emotional wellbeing is indicated by absence of clinically significant psychosocial distress or social isolation.        Personal Goals Discharge:   Exercise Goals and Review: Exercise Goals    Row Name 07/29/17 1430             Exercise Goals   Increase Physical Activity  Yes       Intervention  Provide advice, education, support and counseling about physical activity/exercise needs.;Develop an individualized exercise prescription for aerobic and resistive training based on initial evaluation findings, risk stratification, comorbidities and participant's personal goals.       Expected Outcomes  Achievement of increased cardiorespiratory fitness and enhanced flexibility, muscular endurance and strength shown through measurements of functional capacity and personal statement of participant.       Increase Strength and Stamina  Yes       Intervention  Provide advice, education, support and counseling about physical activity/exercise needs.;Develop an individualized exercise prescription for aerobic and resistive training based on initial evaluation findings, risk stratification, comorbidities and participant's personal goals.       Expected Outcomes  Achievement of increased cardiorespiratory fitness and enhanced flexibility, muscular endurance and strength shown through measurements of functional capacity and personal statement of participant.  Able to understand and use rate of perceived exertion (RPE) scale  Yes       Intervention  Provide education and explanation on how to use RPE scale       Expected Outcomes  Short Term: Able to use RPE daily in rehab to express subjective intensity level;Long Term:  Able to use RPE to guide intensity level when exercising  independently       Able to understand and use Dyspnea scale  Yes       Intervention  Provide education and explanation on how to use Dyspnea scale       Expected Outcomes  Short Term: Able to use Dyspnea scale daily in rehab to express subjective sense of shortness of breath during exertion;Long Term: Able to use Dyspnea scale to guide intensity level when exercising independently       Knowledge and understanding of Target Heart Rate Range (THRR)  Yes       Intervention  Provide education and explanation of THRR including how the numbers were predicted and where they are located for reference       Expected Outcomes  Short Term: Able to state/look up THRR;Long Term: Able to use THRR to govern intensity when exercising independently;Short Term: Able to use daily as guideline for intensity in rehab       Able to check pulse independently  Yes       Intervention  Provide education and demonstration on how to check pulse in carotid and radial arteries.;Review the importance of being able to check your own pulse for safety during independent exercise       Expected Outcomes  Short Term: Able to explain why pulse checking is important during independent exercise;Long Term: Able to check pulse independently and accurately       Understanding of Exercise Prescription  Yes       Intervention  Provide education, explanation, and written materials on patient's individual exercise prescription       Expected Outcomes  Short Term: Able to explain program exercise prescription;Long Term: Able to explain home exercise prescription to exercise independently          Nutrition & Weight - Outcomes: Pre Biometrics - 07/29/17 1430      Pre Biometrics   Height  6' 0.5" (1.842 m)    Weight  220 lb 4.8 oz (99.9 kg)    Waist Circumference  41 inches    Hip Circumference  46 inches    Waist to Hip Ratio  0.89 %    BMI (Calculated)  29.45    Single Leg Stand  17.5 seconds        Nutrition:   Nutrition  Discharge: Nutrition Assessments - 07/29/17 1431      MEDFICTS Scores   Pre Score  48       Education Questionnaire Score: Knowledge Questionnaire Score - 07/29/17 1439      Knowledge Questionnaire Score   Pre Score  24/28  Correct answers reviewed with Sam       Goals reviewed with patient; copy given to patient.

## 2017-08-23 NOTE — Telephone Encounter (Signed)
Pt returning our call  ° °

## 2017-08-24 NOTE — Telephone Encounter (Signed)
Spoke with patient and reviewed recommendations to try zio monitor and he has upcoming appointment on Tuesday and would like to wait until then to have it placed. He was appreciative for the call back and had no further questions or concerns at this time.

## 2017-08-30 NOTE — Telephone Encounter (Signed)
Do not fill verapamil b/c it was stopped by cardiology.

## 2017-08-30 NOTE — Progress Notes (Signed)
Cardiology Office Note  Date:  08/31/2017   ID:  Mitchell Stephens., DOB 21-May-1946, MRN [id number]  PCP:  Gayland Curry, DO   Chief Complaint  Patient presents with  . Other    4 to 6 week follow up. Patient c/o chest pain when woerking out but it does pass. Meds reviewed verbally with patient.     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, Prior episodes of tachycardia associated with alcohol, none recently asd repair, age 74 Who presents for follow-up of his coronary artery disease, occluded RCA  Reports initially having difficult time following the cardiac catheterization, had fatigue and shortness of breath Did cardiac rehab and transition to working out in his own gym Now exercising 6 days/week  Reports having episode of tachycardia, woke up in the middle of the night  Approximately August 22, 2017, called our office the next day event monitor was offered, he declined at the time, has not had any further episodes of tachycardia Reports that he did Valsalva and arrhythmia resolved  When he exercises, After 6 min to 10 min, gets SOB, pushes through  Then able to continue on for additional 15 minutes or so  EKG personally reviewed by myself on todays visit Shows normal sinus rhythm rate 66 bpm no significant ST or T-wave changes  Other past medical history reviewed  March 2018 started developing exertional chest pain with shortness of breath Several times had to stop himself to catch his breath, weight for chest discomfort to resolve Always exertional related  Had episodes April May Chest pain episodes with breathing seemed to improve in the summer  Stress test June 23, 2017 Exercise myocardial perfusion imaging study with no significant ischemia Large region of fixed  defect in the inferior and apical wall consistent with previous MI. Inferior and apical wall hypokinesis.  EF estimated at 45% No EKG changes concerning for ischemia at peak stress or in recovery. Target heart rate achieved. Moderate risk scan secondary to size of old infarct.  Cardiac catheterization June 30, 2017  Prox LAD lesion, 30 %stenosed.  Prox Cx lesion, 50 %stenosed.  Prox RCA lesion, 100 %stenosed.  The left ventricular ejection fraction is 35-45% by visual estimate.   PMH:   has a past medical history of Coronary artery disease, Depression with anxiety, Gastric ulcer, acute with hemorrhage (2001), H/O congenital atrial septal defect (ASD) repair, Helicobacter pylori gastritis, Heme positive stool, History of gout, History of peptic ulcer disease, History of PSVT (paroxysmal supraventricular tachycardia), Hyperlipidemia, Hypertension, Ischemic cardiomyopathy, Obstructive sleep apnea, Perennial allergic rhinitis, and Tobacco abuse.  PSH:    Past Surgical History:  Procedure Laterality Date  . ASD REPAIR, OSTIUM PRIMUM  1966  . 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 LEFT HEART CATH AND CORONARY ANGIOGRAPHY Left 06/30/2017   Procedure: LEFT HEART CATH AND CORONARY ANGIOGRAPHY;  Surgeon: Minna Merritts, MD;  Location: [place] [facility];  Service: Cardiovascular;  Laterality: Left;  . open heart surgery  1966   Dr. Annamaria Boots ([facility]); was PFO repair--has been normal in f/u  . PALATE / UVULA BIOPSY / EXCISION  2000   Dr.Sprenhe [facility])  . TONSILLECTOMY AND ADENOIDECTOMY  1959    Current Outpatient Medications  Medication Sig Dispense Refill  . acetaminophen (TYLENOL) 500 MG  tablet Take 2 tablets (1,000 mg total) daily by mouth. 60 tablet 0  . aspirin EC 81 MG tablet Take 81 mg by mouth daily.    Marland Kitchen buPROPion (WELLBUTRIN XL) 300 MG 24 hr tablet Take 300 mg by mouth daily.    . hydrochlorothiazide (HYDRODIURIL) 25 MG tablet Take one tablet by mouth once  daily 90 tablet 2  . isosorbide mononitrate (IMDUR) 30 MG 24 hr tablet Take 0.5 tablets (15 mg total) by mouth daily. 30 tablet 6  . losartan (COZAAR) 25 MG tablet Take 1 tablet (25 mg total) by mouth daily. 90 tablet 3  . metoprolol succinate (TOPROL-XL) 100 MG 24 hr tablet Take 1 tablet (100 mg total) by mouth daily. 90 tablet 3  . naphazoline-glycerin (CLEAR EYES) 0.012-0.2 % SOLN Place 1-2 drops into both eyes 4 (four) times daily as needed (for redness).    Marland Kitchen senna (SENOKOT) 8.6 MG tablet Take 2 tablets by mouth at bedtime.    . simvastatin (ZOCOR) 40 MG tablet TAKE ONE TABLET BY MOUTH ONCE DAILY 90 tablet 2  . traZODone (DESYREL) 50 MG tablet TAKE 1 TABLET AT BEDTIME AS NEEDED FOR INSOMNIA 90 tablet 2  . nitroGLYCERIN (NITROSTAT) 0.4 MG SL tablet Place 1 tablet (0.4 mg total) under the tongue every 5 (five) minutes as needed for chest pain. 25 tablet 6   No current facility-administered medications for this visit.      Allergies:   Pollen extract   Social History:  The patient  reports that he quit smoking about 1 years ago. He quit after 40.00 years of use. He has quit using smokeless tobacco. He reports that he does not drink alcohol or use drugs.   Family History:   family history includes Arthritis in his mother; Failure to thrive in his mother.    Review of Systems: Review of Systems  Constitutional: Negative.   Respiratory: Negative.   Cardiovascular: Negative.   Gastrointestinal: Negative.   Musculoskeletal: Negative.   Neurological: Negative.   Psychiatric/Behavioral: Negative.   All other systems reviewed and are negative.    PHYSICAL EXAM: VS:  BP 112/66 (BP Location: Left Arm, Patient Position: Sitting, Cuff Size: Normal)   Pulse 66   Ht 6' (1.829 m)   Wt 217 lb 8 oz (98.7 kg)   BMI 29.50 kg/m  , BMI Body mass index is 29.5 kg/m. GEN: Well nourished, well developed, in no acute distress  HEENT: normal  Neck: no JVD, carotid bruits, or masses Cardiac: RRR;  no murmurs, rubs, or gallops,no edema  Respiratory:  clear to auscultation bilaterally, normal work of breathing GI: soft, nontender, nondistended, + BS MS: no deformity or atrophy  Skin: warm and dry, no rash Neuro:  Strength and sensation are intact Psych: euthymic mood, full affect    Recent Labs: 08/12/2017: ALT 16; BUN 23; Creat 1.15; Hemoglobin 13.9; Platelets 233; Potassium 4.2; Sodium 137    Lipid Panel Lab Results  Component Value Date   CHOL 128 08/12/2017   HDL 47 08/12/2017   LDLCALC 60 08/07/2016   TRIG 115 08/12/2017      Wt Readings from Last 3 Encounters:  08/31/17 217 lb 8 oz (98.7 kg)  08/16/17 219 lb (99.3 kg)  08/12/17 218 lb (98.9 kg)       ASSESSMENT AND PLAN:  Cad , with stable angina Exercising on a regular basis, mild shortness of breath after 6-10 minutes of exercise though able to push through No further workup at this time  Discussed various treatment options for occluded RCA He will think about discussing with interventional group in [facility] whether to address CTO  Tobacco abuse - Plan: EKG 12-Lead Reports that he stopped smoking November 2017  Stable  Hyperglycemia - Plan: EKG 12-Lead We have encouraged continued exercise, careful diet management in an effort to lose weight. Weight stable on today's visit  Essential hypertension, benign - Plan: EKG 12-Lead Blood pressure is well controlled on today's visit. No changes made to the medications. Stable  Mixed hyperlipidemia - Plan: EKG 12-Lead Cholesterol is at goal on the current lipid regimen. No changes to the medications were made. Stable  History of atrial septal defect repair - Plan: EKG 12-Lead  repair at age 53 . No indication of residual defect  Cardiomyopathy Echocardiogram ordered to evaluate ejection fraction We have started Aldactone 25 mg daily  Obstructive sleep apnea on CPAP - Plan: EKG 12-Lead Compliant with his CPAP  Disposition:   F/U 6 months      Total encounter time more than 25 minutes  Greater than 50% was spent in counseling and coordination of care with the patient \   Orders Placed This Encounter  Procedures  . EKG 12-Lead     Signed, Esmond Plants, M.D., Ph.D. 08/31/2017  [place], [place]

## 2017-08-31 ENCOUNTER — Ambulatory Visit: Payer: Medicare HMO | Admitting: Cardiovascular Disease

## 2017-08-31 ENCOUNTER — Encounter: Payer: Self-pay | Admitting: Cardiovascular Disease

## 2017-08-31 VITALS — BP 112/66 | HR 66 | Ht 72.0 in | Wt 217.5 lb

## 2017-08-31 DIAGNOSIS — Z9989 Dependence on other enabling machines and devices: Secondary | ICD-10-CM | POA: Diagnosis not present

## 2017-08-31 DIAGNOSIS — I200 Unstable angina: Secondary | ICD-10-CM | POA: Diagnosis not present

## 2017-08-31 DIAGNOSIS — Z720 Tobacco use: Secondary | ICD-10-CM

## 2017-08-31 DIAGNOSIS — I10 Essential (primary) hypertension: Secondary | ICD-10-CM

## 2017-08-31 DIAGNOSIS — I25118 Atherosclerotic heart disease of native coronary artery with other forms of angina pectoris: Secondary | ICD-10-CM | POA: Insufficient documentation

## 2017-08-31 DIAGNOSIS — G4733 Obstructive sleep apnea (adult) (pediatric): Secondary | ICD-10-CM | POA: Diagnosis not present

## 2017-08-31 DIAGNOSIS — E782 Mixed hyperlipidemia: Secondary | ICD-10-CM | POA: Diagnosis not present

## 2017-08-31 DIAGNOSIS — I429 Cardiomyopathy, unspecified: Secondary | ICD-10-CM

## 2017-08-31 MED ORDER — SPIRONOLACTONE 25 MG PO TABS: 25.0000 mg | ORAL_TABLET | Freq: Every day | ORAL | 3 refills | Status: DC

## 2017-08-31 NOTE — Patient Instructions (Addendum)
Medication Instructions:   Please start spironolactone 25 mg daily  Labwork:  No new labs needed  Testing/Procedures:  We will order an echocardiogram for cardiomyopathy   Follow-Up: It was a pleasure seeing you in the office today. Please call us if you have new issues that need to be addressed before your next appt.  [phone]  Your physician wants you to follow-up in: 6 months.  You will receive a reminder letter in the mail two months in advance. If you don't receive a letter, please call our office to schedule the follow-up appointment.  If you need a refill on your cardiac medications before your next appointment, please call your pharmacy.

## 2017-09-03 ENCOUNTER — Telehealth: Payer: Self-pay | Admitting: Cardiovascular Disease

## 2017-09-03 NOTE — Telephone Encounter (Signed)
Patient states pharmacy said his metoprolol went from 100 MG to 50 MG which he was not aware of  He would like confirmation of medication

## 2017-09-03 NOTE — Telephone Encounter (Signed)
Patient went to pharmacy and they gave him metoprolol 50 mg and he was not aware that it was changed. Reviewed last office visit note and medication list both which show metoprolol 100 mg once daily. Advised him to go back and speak with pharmacy because there have been no changes made for this medication and maybe that was a error. He was very appreciative for the call and had no further questions at this time.

## 2017-09-06 ENCOUNTER — Encounter: Payer: Self-pay | Admitting: Cardiovascular Disease

## 2017-09-13 ENCOUNTER — Encounter: Payer: Self-pay | Admitting: Internal Medicine

## 2017-09-15 NOTE — Telephone Encounter (Signed)
Called and scheduled patient to see Dr. Martinique on 10/11/17 at 12:00PM with arrival time of 11:45AM at the [facility] office above [facility]. Left appointment information on voicemail and will send mychart message with this information as well.

## 2017-09-19 ENCOUNTER — Other Ambulatory Visit: Payer: Self-pay | Admitting: Internal Medicine

## 2017-09-22 ENCOUNTER — Other Ambulatory Visit: Payer: Self-pay | Admitting: Cardiovascular Disease

## 2017-09-22 DIAGNOSIS — I429 Cardiomyopathy, unspecified: Secondary | ICD-10-CM

## 2017-09-30 ENCOUNTER — Ambulatory Visit (INDEPENDENT_AMBULATORY_CARE_PROVIDER_SITE_OTHER): Payer: Medicare HMO

## 2017-09-30 ENCOUNTER — Other Ambulatory Visit: Payer: Self-pay

## 2017-09-30 ENCOUNTER — Telehealth: Payer: Self-pay | Admitting: Cardiovascular Disease

## 2017-09-30 DIAGNOSIS — I429 Cardiomyopathy, unspecified: Secondary | ICD-10-CM | POA: Diagnosis not present

## 2017-09-30 NOTE — Telephone Encounter (Signed)
Patient wants to access Cath records and results on mychart from September   Is there something clinical staff needs to release or is this not something available on mychart   Please advise

## 2017-09-30 NOTE — Telephone Encounter (Signed)
I left a message for the patient to call. 

## 2017-10-01 NOTE — Telephone Encounter (Signed)
Left voicemail message to call back  

## 2017-10-04 NOTE — Telephone Encounter (Signed)
I spoke with the patient- I advised him that from what I can see in his MyChart, the cath report was automatically released on 06/30/17. I also advised him I am not certain as to what he can see on his side of MyChart in this regard. His concern was Dr. Martinique being able to see his records when he meets with him on 10/11/17. I advised him that Dr. Martinique will have access to his records. He is agreeable with this.

## 2017-10-04 NOTE — Telephone Encounter (Signed)
Patient returning call please call again

## 2017-10-08 ENCOUNTER — Other Ambulatory Visit: Payer: Self-pay | Admitting: Internal Medicine

## 2017-10-09 NOTE — Progress Notes (Signed)
Cardiology Office Note   Date:  10/11/2017   ID:  Mitchell Frame., DOB 1946/09/11, MRN [id number]  PCP:  Gayland Curry, DO  Cardiologist:  Esmond Plants MD  Chief Complaint  Patient presents with  . Chest Pain  . Coronary Artery Disease      History of Present Illness: Mitchell Churchwell. is a 72 y.o. male who is seen at the request of Dr. Rockey Situ for evaluation for possible CTO intervention. He has a complex past medical history including remote atrial septal defect repair, hypertension, hyperlipidemia, sleep apnea, and remote tobacco and alcohol abuse. He was evaluated this past fall  for exertional angina and dyspnea included stress testing which showed a large area of inferior apical scar without ischemia and an EF of 45%.  Diagnostic catheterization then showed an occluded proximal right coronary artery with left to right collaterals and EF of 35-45%.  Following this finding, he was switched from high dose of verapamil, which he had been taking in the past for PSVT, over to beta-blocker therapy. Nitrates were added. Echo in December showed improvement in EF to 50% with moderate MR.   The patient states he had his first symptoms last April while mowing the grass. His symptoms recurred in July and he sought the above evaluation. Since diagnosis his medications were optimized and he completed cardiac Rehab. He feels his symptoms are 100% better than 2 months ago. He still has some walk through angina- feels some chest discomfort at 10-15 minutes of walking that resolves with continued exercise. He walks 4x/week for 30-40 minutes at 3 mi/hr. Otherwise feels very good. Denies dyspnea, palpitations, dizziness, or other chest pain. He is tolerating medications well.     Past Medical History:  Diagnosis Date  . Coronary artery disease    a. 06/2017 MV: EF 45%, large inf and apical defect w/o ischemia;  b. 06/2017 Cath: LM nl, LAD 30ost, LCX 50p, OM1/2 ok, RCA 100p w/ L->R  collats. EF 35%.  . Depression with anxiety   . Gastric ulcer, acute with hemorrhage 2001   Dr. Sharlet Salina, [facility]  . H/O congenital atrial septal defect (ASD) repair   . Helicobacter pylori gastritis   . Heme positive stool   . History of gout   . History of peptic ulcer disease    had bleeding ulcers requiring transfusion  . History of PSVT (paroxysmal supraventricular tachycardia)   . Hyperlipidemia   . Hypertension   . Ischemic cardiomyopathy    a. 06/2017 LV gram: EF 35%.  . Obstructive sleep apnea    on CPAP  . Perennial allergic rhinitis   . Tobacco abuse     Past Surgical History:  Procedure Laterality Date  . ASD REPAIR, OSTIUM PRIMUM  1966  . 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 LEFT HEART CATH AND CORONARY ANGIOGRAPHY Left 06/30/2017   Procedure: LEFT HEART CATH AND CORONARY ANGIOGRAPHY;  Surgeon: Minna Merritts, MD;  Location: [place] [facility];  Service: Cardiovascular;  Laterality: Left;  . open heart surgery  1966   Dr. Annamaria Boots ([facility]); was PFO repair--has been normal in f/u  . PALATE / UVULA BIOPSY / EXCISION  2000   Dr.Sprenhe [facility])  . TONSILLECTOMY AND ADENOIDECTOMY  1959     Current Outpatient Medications  Medication Sig Dispense Refill  . acetaminophen (TYLENOL) 500 MG tablet Take 2 tablets (1,000 mg total) daily by mouth. 60 tablet 0  . aspirin EC  81 MG tablet Take 81 mg by mouth daily.    Marland Kitchen buPROPion (WELLBUTRIN XL) 300 MG 24 hr tablet TAKE ONE TABLET BY MOUTH DAILY 90 tablet 1  . hydrochlorothiazide (HYDRODIURIL) 25 MG tablet TAKE ONE TABLET BY MOUTH ONCE DAILY 90 tablet 2  . isosorbide dinitrate (ISORDIL) 30 MG tablet Take 30 mg by mouth daily. Take 0.5 mg (15 mg ) by mouth daily    . losartan (COZAAR) 25 MG tablet Take 1 tablet (25 mg total) by mouth daily. 90 tablet 3  . metoprolol succinate (TOPROL-XL) 100 MG 24 hr tablet Take 1 tablet (100 mg total) by mouth daily. 90 tablet 3  . naphazoline-glycerin (CLEAR EYES) 0.012-0.2 %  SOLN Place 1-2 drops into both eyes 4 (four) times daily as needed (for redness).    . nitroGLYCERIN (NITROSTAT) 0.4 MG SL tablet Place 0.4 mg under the tongue every 5 (five) minutes as needed for chest pain.    Marland Kitchen senna (SENOKOT) 8.6 MG tablet Take 2 tablets by mouth at bedtime.    . simvastatin (ZOCOR) 40 MG tablet TAKE ONE TABLET BY MOUTH ONCE DAILY 90 tablet 2  . spironolactone (ALDACTONE) 25 MG tablet Take 1 tablet (25 mg total) by mouth daily. 90 tablet 3  . traZODone (DESYREL) 50 MG tablet TAKE 1 TABLET AT BEDTIME AS NEEDED FOR INSOMNIA 90 tablet 2   No current facility-administered medications for this visit.     Allergies:   Pollen extract    Social History:  The patient  reports that he quit smoking about 2 years ago. He quit after 40.00 years of use. He has quit using smokeless tobacco. He reports that he does not drink alcohol or use drugs.  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  Family History:  The patient's family history includes Arthritis in his mother; Failure to thrive in his mother.    ROS:  Please see the history of present illness.   Otherwise, review of systems are positive for none.   All other systems are reviewed and negative.    PHYSICAL EXAM: VS:  BP 110/63   Pulse 64   Ht 6' (1.829 m)   Wt 218 lb (98.9 kg)   SpO2 99%   BMI 29.57 kg/m  , BMI Body mass index is 29.57 kg/m. GEN: Well nourished, well developed, in no acute distress  HEENT: normal  Neck: no JVD, carotid bruits, or masses Cardiac: RRR; no murmurs, rubs, or gallops,no edema  Respiratory:  clear to auscultation bilaterally, normal work of breathing GI: soft, nontender, nondistended, + BS MS: no deformity or atrophy  Skin: warm and dry, no rash Neuro:  Strength and sensation are intact Psych: euthymic mood, full affect   EKG:  EKG is not ordered today. The ekg ordered today demonstrates N/A   Recent Labs: 08/12/2017: ALT 16; BUN 23; Creat 1.15; Hemoglobin 13.9; Platelets 233; Potassium  4.2; Sodium 137    Lipid Panel    Component Value Date/Time   CHOL 128 08/12/2017 0913   CHOL 128 01/09/2016 0825   TRIG 115 08/12/2017 0913   HDL 47 08/12/2017 0913   HDL 43 01/09/2016 0825   CHOLHDL 2.7 08/12/2017 0913   VLDL 20 08/07/2016 1005   LDLCALC 60 08/07/2016 1005   LDLCALC 58 01/09/2016 0825      Wt Readings from Last 3 Encounters:  10/11/17 218 lb (98.9 kg)  08/31/17 217 lb 8 oz (98.7 kg)  08/16/17 219 lb (99.3 kg)  Other studies Reviewed: Additional studies/ records that were reviewed today include:   Cardiac cath: 06/30/17: Procedures   LEFT HEART CATH AND CORONARY ANGIOGRAPHY  Conclusion    Prox LAD lesion, 30 %stenosed.  Prox Cx lesion, 50 %stenosed.  Prox RCA lesion, 100 %stenosed.  The left ventricular ejection fraction is 35-45% by visual estimate     Echo 09/30/17: Study Conclusions  - Left ventricle: The cavity size was normal. There was mild   concentric hypertrophy. Systolic function was normal. The   estimated ejection fraction was in the range of 50% to 55%.   Images were inadequate for LV wall motion assessment. Doppler   parameters are consistent with abnormal left ventricular   relaxation (grade 1 diastolic dysfunction). - Mitral valve: Moderately calcified annulus. The findings are   consistent with trivial stenosis. There was moderate   regurgitation. Mean gradient (D): 2 mm Hg. Valve area by pressure   half-time: 2.47 cm^2. - Left atrium: The atrium was mildly dilated. - Tricuspid valve: There was trivial regurgitation. - Pulmonary arteries: Systolic pressure was within the normal   range.   ASSESSMENT AND PLAN:  1.  Coronary artery disease with CTO of the proximal RCA with excellent collaterals. He has stable angina that has improved significantly with medical therapy and Cardiac Rehab. Currently class 1-2.   2.  Ischemic cardiomyopathy: Ef improved from 40-45% to 55% by Echo.   3.  Essential hypertension:  Blood pressure is well controlled.  4.  Hyperlipidemia: LDL of 60 November 2017.  He remains on statin therapy.  I personally reviewed the results of his Echo, Myoview and cardiac cath studies and reviewed his angiogram with him today. From an anatomic standpoint I think he has favorable anatomy for CTO PCI. His occlusion appears to be short with a nipple at the occlusion. The occlusion is very proximal in the RCA so support could be an issue. We discussed the procedure at length including need for dual access and typically more prolonged procedure. We carefully reviewed risks including bleeding, vascular complications, MI, CVA, perforation, etc.   To me the only reason to attempt this intervention would be for symptom relief. He is currently pretty happy with his functional status and response to medical therapy. I don't think we can claim that PCI would improve risk of MI, mortality, or improve LV function since this has already improved. After prolonged discussion Mitchell Stephens has opted to continue medical therapy for now. He understands that if his symptoms change we can readdress. He will follow up with Dr. Rockey Situ.    Signed, Ryka Beighley Martinique, MD  10/11/2017 12:43 PM    [facility] [address] [place], [place], [address] Phone [phone], Fax [phone]

## 2017-10-11 ENCOUNTER — Encounter: Payer: Self-pay | Admitting: Cardiology

## 2017-10-11 ENCOUNTER — Ambulatory Visit: Payer: Medicare HMO | Admitting: Cardiology

## 2017-10-11 VITALS — BP 110/63 | HR 64 | Ht 72.0 in | Wt 218.0 lb

## 2017-10-11 DIAGNOSIS — I10 Essential (primary) hypertension: Secondary | ICD-10-CM

## 2017-10-11 DIAGNOSIS — E785 Hyperlipidemia, unspecified: Secondary | ICD-10-CM

## 2017-10-11 DIAGNOSIS — I209 Angina pectoris, unspecified: Secondary | ICD-10-CM | POA: Diagnosis not present

## 2017-10-11 DIAGNOSIS — I25118 Atherosclerotic heart disease of native coronary artery with other forms of angina pectoris: Secondary | ICD-10-CM | POA: Diagnosis not present

## 2017-11-19 ENCOUNTER — Other Ambulatory Visit: Payer: Self-pay | Admitting: *Deleted

## 2017-11-19 MED ORDER — TRAZODONE HCL 50 MG PO TABS: ORAL_TABLET | ORAL | 1 refills | Status: DC

## 2017-11-19 NOTE — Telephone Encounter (Signed)
[place]

## 2017-11-26 ENCOUNTER — Other Ambulatory Visit: Payer: Self-pay | Admitting: Nurse Practitioner

## 2017-12-09 ENCOUNTER — Other Ambulatory Visit: Payer: Medicare HMO

## 2017-12-09 DIAGNOSIS — R739 Hyperglycemia, unspecified: Secondary | ICD-10-CM | POA: Diagnosis not present

## 2017-12-09 DIAGNOSIS — I10 Essential (primary) hypertension: Secondary | ICD-10-CM

## 2017-12-09 DIAGNOSIS — E782 Mixed hyperlipidemia: Secondary | ICD-10-CM

## 2017-12-10 LAB — COMPLETE METABOLIC PANEL WITH GFR
AG Ratio: 2.1 (calc) (ref 1.0–2.5)
ALT: 11 U/L (ref 9–46)
AST: 13 U/L (ref 10–35)
Albumin: 4.1 g/dL (ref 3.6–5.1)
Alkaline phosphatase (APISO): 39 U/L — ABNORMAL LOW (ref 40–115)
BUN/Creatinine Ratio: 15 (calc) (ref 6–22)
BUN: 21 mg/dL (ref 7–25)
CO2: 29 mmol/L (ref 20–32)
Calcium: 9.5 mg/dL (ref 8.6–10.3)
Chloride: 101 mmol/L (ref 98–110)
Creat: 1.41 mg/dL — ABNORMAL HIGH (ref 0.70–1.18)
GFR, Est African American: 57 mL/min/{1.73_m2} — ABNORMAL LOW (ref >=60)
GFR, Est Non African American: 49 mL/min/{1.73_m2} — ABNORMAL LOW (ref >=60)
Globulin: 2 g/dL (calc) (ref 1.9–3.7)
Glucose, Bld: 107 mg/dL — ABNORMAL HIGH (ref 65–99)
Potassium: 4.2 mmol/L (ref 3.5–5.3)
Sodium: 138 mmol/L (ref 135–146)
Total Bilirubin: 0.8 mg/dL (ref 0.2–1.2)
Total Protein: 6.1 g/dL (ref 6.1–8.1)

## 2017-12-10 LAB — CBC WITH DIFFERENTIAL/PLATELET
Basophils Absolute: 59 cells/uL (ref 0–200)
Basophils Relative: 0.6 %
Eosinophils Absolute: 196 cells/uL (ref 15–500)
Eosinophils Relative: 2 %
HCT: 39.7 % (ref 38.5–50.0)
Hemoglobin: 13.5 g/dL (ref 13.2–17.1)
Lymphs Abs: 1098 cells/uL (ref 850–3900)
MCH: 31 pg (ref 27.0–33.0)
MCHC: 34 g/dL (ref 32.0–36.0)
MCV: 91.1 fL (ref 80.0–100.0)
MPV: 10.2 fL (ref 7.5–12.5)
Monocytes Relative: 8.5 %
Neutro Abs: 7615 cells/uL (ref 1500–7800)
Neutrophils Relative %: 77.7 %
Platelets: 238 10*3/uL (ref 140–400)
RBC: 4.36 10*6/uL (ref 4.20–5.80)
RDW: 12.6 % (ref 11.0–15.0)
Total Lymphocyte: 11.2 %
WBC mixed population: 833 cells/uL (ref 200–950)
WBC: 9.8 10*3/uL (ref 3.8–10.8)

## 2017-12-10 LAB — LIPID PANEL
Cholesterol: 126 mg/dL (ref <200)
HDL: 40 mg/dL — ABNORMAL LOW (ref >40)
LDL Cholesterol (Calc): 63 mg/dL (calc)
Non-HDL Cholesterol (Calc): 86 mg/dL (calc) (ref <130)
Total CHOL/HDL Ratio: 3.2 (calc) (ref <5.0)
Triglycerides: 151 mg/dL — ABNORMAL HIGH (ref <150)

## 2017-12-10 LAB — HEMOGLOBIN A1C
Hgb A1c MFr Bld: 5.7 % of total Hgb — ABNORMAL HIGH (ref <5.7)
Mean Plasma Glucose: 117 (calc)
eAG (mmol/L): 6.5 (calc)

## 2017-12-13 ENCOUNTER — Ambulatory Visit (INDEPENDENT_AMBULATORY_CARE_PROVIDER_SITE_OTHER): Payer: Medicare HMO | Admitting: Internal Medicine

## 2017-12-13 ENCOUNTER — Encounter: Payer: Self-pay | Admitting: Internal Medicine

## 2017-12-13 VITALS — BP 118/60 | HR 60 | Temp 97.5°F | Wt 215.0 lb

## 2017-12-13 DIAGNOSIS — I25110 Atherosclerotic heart disease of native coronary artery with unstable angina pectoris: Secondary | ICD-10-CM

## 2017-12-13 DIAGNOSIS — Z9989 Dependence on other enabling machines and devices: Secondary | ICD-10-CM

## 2017-12-13 DIAGNOSIS — R739 Hyperglycemia, unspecified: Secondary | ICD-10-CM | POA: Diagnosis not present

## 2017-12-13 DIAGNOSIS — Z23 Encounter for immunization: Secondary | ICD-10-CM

## 2017-12-13 DIAGNOSIS — E782 Mixed hyperlipidemia: Secondary | ICD-10-CM | POA: Diagnosis not present

## 2017-12-13 DIAGNOSIS — F324 Major depressive disorder, single episode, in partial remission: Secondary | ICD-10-CM | POA: Diagnosis not present

## 2017-12-13 DIAGNOSIS — I429 Cardiomyopathy, unspecified: Secondary | ICD-10-CM | POA: Diagnosis not present

## 2017-12-13 DIAGNOSIS — Z87891 Personal history of nicotine dependence: Secondary | ICD-10-CM

## 2017-12-13 DIAGNOSIS — G4733 Obstructive sleep apnea (adult) (pediatric): Secondary | ICD-10-CM | POA: Diagnosis not present

## 2017-12-13 DIAGNOSIS — I10 Essential (primary) hypertension: Secondary | ICD-10-CM | POA: Diagnosis not present

## 2017-12-13 DIAGNOSIS — R69 Illness, unspecified: Secondary | ICD-10-CM | POA: Diagnosis not present

## 2017-12-13 HISTORY — DX: Personal history of nicotine dependence: Z87.891

## 2017-12-13 MED ORDER — TETANUS-DIPHTH-ACELL PERTUSSIS 5-2-15.5 LF-MCG/0.5 IM SUSP: 0.5000 mL | Freq: Once | INTRAMUSCULAR | 0 refills | Status: AC

## 2017-12-13 NOTE — Progress Notes (Signed)
Location:  [facility] clinic Provider:  Florena Kozma L. Mariea Clonts, D.O., C.M.D.  Code Status: DNR Goals of Care:  Advanced Directives 12/13/2017  Does Patient Have a Medical Advance Directive? Yes  Type of Paramedic of [place];Living will  Does patient want to make changes to medical advance directive? No - Patient declined  Copy of [place] in Chart? Yes  Would patient like information on creating a medical advance directive? -     Chief Complaint  Patient presents with  . Medical Management of Chronic Issues    32mth follow-up, DISCUSS LABS  . ACP    LIVING WILL, HCPOA    HPI: Patient is a 72 y.o. male seen today for medical management of chronic diseases.    He walked daily while at the beach, sometimes 2x per day.  Read 14 books in 7-8 wks.  They had a nice time.   CAD:  Not having chest pains now, he says.  Then says that if he works too hard or exerts himself too much, but not like it was at first.  Can do 30-35 mins at a brisk walk now.  Weight down 3 lbs from January, but had a big weekend of eating this past weekend.  Has been 208 at home.  Discussed triglyceride results which trended up.  Fasted 12 hrs not 15 like he normally would.  Discussed fried foods and sweets affecting this.  LDL remains at goal.  He walked, but didn't eat any better over the two months.  He did have fried fish every Saturday at the beach.    No new concerns except his mental state.   Thinks his MI happened just before he saw [place].  His mood just hasn't been right since.  He worries about another heart attack.  Knowing that the chronic occlusion is right at the beginning of the artery makes him frightened b/c nothing can be done about it.  Saw Dr. Martinique.  Having surgery wouldn't affect his prognosis.  Has great collaterals.  He's bothered, too, by the amount of damage he had to his heart.  EF improved considerably to 50% from 30-35%.    Sees Dr. Rockey Situ next on the 8th.      Still goes to several AA meetings per week.  Discussed counseling for his depression.  Sleeping well at night.  Appetite is good.  Says maybe that is becoming a bad habit--eating.    Has an old cpap machine. Needs a Rx for a auto-CPAP machine--it's not fluctuating like it should.  He got his original Rx from the ENT.    Also having congestion and sensitivity to left sinus and tooth.    Past Medical History:  Diagnosis Date  . Coronary artery disease    a. 06/2017 MV: EF 45%, large inf and apical defect w/o ischemia;  b. 06/2017 Cath: LM nl, LAD 30ost, LCX 50p, OM1/2 ok, RCA 100p w/ L->R collats. EF 35%.  . Depression with anxiety   . Gastric ulcer, acute with hemorrhage 2001   Dr. Sharlet [place], [facility]  . H/O congenital atrial septal defect (ASD) repair   . Helicobacter pylori gastritis   . Heme positive stool   . History of gout   . History of peptic ulcer disease    had bleeding ulcers requiring transfusion  . History of PSVT (paroxysmal supraventricular tachycardia)   . Hyperlipidemia   . Hypertension   . Ischemic cardiomyopathy    a. 06/2017 LV gram: EF  35%.  . Obstructive sleep apnea    on CPAP  . Perennial allergic rhinitis   . Tobacco abuse     Past Surgical History:  Procedure Laterality Date  . ASD REPAIR, OSTIUM PRIMUM  1966  . 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 LEFT HEART CATH AND CORONARY ANGIOGRAPHY Left 06/30/2017   Procedure: LEFT HEART CATH AND CORONARY ANGIOGRAPHY;  Surgeon: Minna Merritts, MD;  Location: [place] [facility];  Service: Cardiovascular;  Laterality: Left;  . open heart surgery  1966   Dr. Annamaria Boots ([facility]); was PFO repair--has been normal in f/u  . PALATE / UVULA BIOPSY / EXCISION  2000   Dr[facility]Sprenhe [facility])  . TONSILLECTOMY AND ADENOIDECTOMY  1959    Allergies  Allergen Reactions  . Pollen Extract Other (See Comments)    Stuffy nose/itchy throat (Seasonal allergies)    Outpatient Encounter Medications as of 12/13/2017   Medication Sig  . acetaminophen (TYLENOL) 500 MG tablet Take 2 tablets (1,000 mg total) daily by mouth.  Marland Kitchen aspirin EC 81 MG tablet Take 81 mg by mouth daily.  Marland Kitchen buPROPion (WELLBUTRIN XL) 300 MG 24 hr tablet TAKE ONE TABLET BY MOUTH DAILY  . hydrochlorothiazide (HYDRODIURIL) 25 MG tablet TAKE ONE TABLET BY MOUTH ONCE DAILY  . isosorbide mononitrate (IMDUR) 30 MG 24 hr tablet TAKE 1/2 TABLET BY MOUTH EVERY DAY  . metoprolol succinate (TOPROL-XL) 100 MG 24 hr tablet Take 1 tablet (100 mg total) by mouth daily.  . naphazoline-glycerin (CLEAR EYES) 0.012-0.2 % SOLN Place 1-2 drops into both eyes 4 (four) times daily as needed (for redness).  . nitroGLYCERIN (NITROSTAT) 0.4 MG SL tablet Place 0.4 mg under the tongue every 5 (five) minutes as needed for chest pain.  Marland Kitchen senna (SENOKOT) 8.6 MG tablet Take 2 tablets by mouth at bedtime.  . simvastatin (ZOCOR) 40 MG tablet TAKE ONE TABLET BY MOUTH ONCE DAILY  . spironolactone (ALDACTONE) 25 MG tablet Take 1 tablet (25 mg total) by mouth daily.  . traZODone (DESYREL) 50 MG tablet Take one tablet by mouth at bedtime as needed for insomnia  . losartan (COZAAR) 25 MG tablet Take 1 tablet (25 mg total) by mouth daily.  . [DISCONTINUED] isosorbide dinitrate (ISORDIL) 30 MG tablet Take 30 mg by mouth daily. Take 0.5 mg (15 mg ) by mouth daily   No facility-administered encounter medications on file as of 12/13/2017.     Review of Systems:  Review of Systems  Constitutional: Negative for chills and fever.  HENT: Negative for congestion.   Eyes: Negative for blurred vision.       Glasses  Respiratory: Negative for cough and shortness of breath.   Cardiovascular: Positive for chest pain. Negative for palpitations, orthopnea, claudication and leg swelling.       Now much less often   Gastrointestinal: Negative for abdominal pain, constipation, diarrhea, heartburn, nausea and vomiting.  Genitourinary: Negative for dysuria.  Musculoskeletal: Negative for joint  pain and myalgias.  Skin: Negative for itching and rash.  Neurological: Negative for dizziness and loss of consciousness.  Endo/Heme/Allergies: Does not bruise/bleed easily.  Psychiatric/Behavioral: Positive for depression. Negative for memory loss. The patient is nervous/anxious. The patient does not have insomnia.        Reports spirits are gradually improving, but still worries about his heart    Health Maintenance  Topic Date Due  . Hepatitis C Screening  Oct 27, 1945  . TETANUS/TDAP  07/31/1964  . COLONOSCOPY  03/22/2019  .  INFLUENZA VACCINE  Completed  . PNA vac Low Risk Adult  Completed    Physical Exam: Vitals:   12/13/17 1043  BP: 118/60  Pulse: 60  Temp: (!) 97.5 F (36.4 C)  TempSrc: Oral  SpO2: 97%  Weight: 215 lb (97.5 kg)   Body mass index is 29.16 kg/m. Physical Exam  Constitutional: He is oriented to person, place, and time. He appears well-developed and well-nourished. No distress.  Cardiovascular: Normal rate, regular rhythm, normal heart sounds and intact distal pulses.  Pulmonary/Chest: Effort normal and breath sounds normal. No respiratory distress.  Abdominal: Bowel sounds are normal.  Musculoskeletal: Normal range of motion.  Neurological: He is alert and oriented to person, place, and time.  Skin: Skin is warm and dry. Capillary refill takes less than 2 seconds.  Psychiatric:  Flatter affect than typical for him    Labs reviewed: Basic Metabolic Panel: Recent Labs    08/04/17 0919 08/12/17 0913 12/09/17 0907  NA 137 137 138  K 4.0 4.2 4.2  CL 97 102 101  CO2 26 29 29   GLUCOSE 127* 108* 107*  BUN 20 23 21   CREATININE 1.17 1.15 1.41*  CALCIUM 9.3 8.9 9.5   Liver Function Tests: Recent Labs    08/12/17 0913 12/09/17 0907  AST 16 13  ALT 16 11  BILITOT 0.5 0.8  PROT 6.2 6.1   No results for input(s): LIPASE, AMYLASE in the last 8760 hours. No results for input(s): AMMONIA in the last 8760 hours. CBC: Recent Labs     06/25/17 1351 08/12/17 0913 12/09/17 0907  WBC 7.6 7.0 9.8  NEUTROABS 4.4 4,396 7,615  HGB 14.0 13.9 13.5  HCT 39.6* 39.6 39.7  MCV 89.9 89.8 91.1  PLT 233 233 238   Lipid Panel: Recent Labs    08/12/17 0913 12/09/17 0907  CHOL 128 126  HDL 47 40*  TRIG 115 151*  CHOLHDL 2.7 3.2   Lab Results  Component Value Date   HGBA1C 5.7 (H) 12/09/2017    Assessment/Plan 1. Cardiomyopathy, unspecified type ([place]) - ischemic, EF had improved on last echo, but still does not have as much energy and worries about his cardiac status due to chronic RCA occlusion -cont current regimen -keep f/u with Dr. Rockey Situ - CBC with Differential/Platelet; Future - Basic metabolic panel; Future  2. Depression, major, single episode, in partial remission ([place]) -improved a bit since MI but remains anxious and concerned about his heart  3. Coronary artery disease involving native coronary artery of native heart with unstable angina pectoris [facility]) -had several very specific cardiology-related questions that I deferred to Dr. Rockey Situ or Dr. Martinique to address about patients similar to him with an occluded RCA, collaterals, etc.   -cont current secondary MI prevention -prior tobacco and alcohol abuse, but in the clear now -is walking 30-35 mins per day and now getting very few chest pains vs immediately post MI  4. Essential hypertension, benign -bp well controlled w/o dizziness, cont current regimen  5. Mixed hyperlipidemia - lipids at goal, but did not go down with walking which disappointed him--discussed need to add dietary changes with fewer sweets, starchy and fried foods to lower this, cont medical regimen - Lipid panel; Future  6. Hyperglycemia - up slightly after poor food choices and relative sedentary state outside of the walking - Hemoglobin A1c; Future  7. Need for Tdap vaccination - Tdap (ADACEL) 02-03-14.5 LF-MCG/0.5 injection; Inject 0.5 mLs into the muscle once for 1 dose.  Dispense:  0.5 mL; Refill: 0  8. Obstructive sleep apnea on CPAP - reports his auto-cpap is not adjusting, just the same all of the time and needs new one, also having more sinus congestion and wants to see ENT about it, as well - Ambulatory referral to ENT  Labs/tests ordered:  Orders Placed This Encounter  Procedures  . Hemoglobin A1c    Standing Status:   Future    Standing Expiration Date:   08/15/2018  . Lipid panel    Standing Status:   Future    Standing Expiration Date:   08/15/2018    Order Specific Question:   Has the patient fasted?    Answer:   Yes  . CBC with Differential/Platelet    Standing Status:   Future    Standing Expiration Date:   08/15/2018  . Basic metabolic panel    Standing Status:   Future    Standing Expiration Date:   08/15/2018    Order Specific Question:   Has the patient fasted?    Answer:   Yes  . Ambulatory referral to ENT    Referral Priority:   Routine    Referral Type:   Consultation    Referral Reason:   Specialty Services Required    Requested Specialty:   Otolaryngology    Number of Visits Requested:   1    Next appt:  04/14/2018 med mgt, labs before   [place]. Janine Reller, D.O. [facility] [address], [place] [address] Cell Phone (Mon-Fri 8am-5pm):  [phone] On Call:  [phone] & follow prompts after 5pm & weekends Office Phone:  [phone] Office Fax:  [phone]

## 2017-12-15 ENCOUNTER — Telehealth: Payer: Self-pay | Admitting: *Deleted

## 2017-12-15 DIAGNOSIS — B9689 Other specified bacterial agents as the cause of diseases classified elsewhere: Secondary | ICD-10-CM

## 2017-12-15 DIAGNOSIS — J019 Acute sinusitis, unspecified: Principal | ICD-10-CM

## 2017-12-15 MED ORDER — AMOXICILLIN-POT CLAVULANATE 875-125 MG PO TABS: 1.0000 | ORAL_TABLET | Freq: Two times a day (BID) | ORAL | 0 refills | Status: DC

## 2017-12-15 NOTE — Telephone Encounter (Signed)
Patient called and stated that he saw you for an appointment on 12/13/17 and mentioned that he was getting a sinus infection. Stated that he has an appointment with the ENT but it isn't until next month. Patient is requesting an antibiotic to clear this up. Having yellow discharge, Nasal soreness and teeth sore. No fever. Would like antibiotic called into [facility]. Please Advise.

## 2017-12-15 NOTE — Telephone Encounter (Signed)
Augmentin was sent to [facility] pharmacy for twice a day for 10 days.  I recommend he drink plenty of water to help thin mucus and flush out infection.  He also should eat a small yogurt daily or take an over the counter probiotic like florastor or align to prevent diarrhea.    Tahja Liao L. Hawken Bielby, D.O. [facility] [address], [place] [address] Cell Phone (Mon-Fri 8am-5pm):  [phone] On Call:  [phone] & follow prompts after 5pm & weekends Office Phone:  [phone] Office Fax:  [phone]

## 2018-01-04 DIAGNOSIS — H6062 Unspecified chronic otitis externa, left ear: Secondary | ICD-10-CM | POA: Diagnosis not present

## 2018-01-04 DIAGNOSIS — R040 Epistaxis: Secondary | ICD-10-CM | POA: Diagnosis not present

## 2018-01-04 DIAGNOSIS — H6122 Impacted cerumen, left ear: Secondary | ICD-10-CM | POA: Diagnosis not present

## 2018-01-04 DIAGNOSIS — G4733 Obstructive sleep apnea (adult) (pediatric): Secondary | ICD-10-CM | POA: Diagnosis not present

## 2018-01-07 NOTE — Progress Notes (Signed)
Cardiology Office Note  Date:  01/10/2018   ID:  [name], DOB May 22, 1946, MRN [id number]  PCP:  Gayland Curry, DO   Chief Complaint  Patient presents with  . Other    3 month follow up per Dr. Martinique. Patient c/o SOB only when working out. Meds reviewed verbally with patient.     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   Diagnostic catheterization showed an occluded proximal right coronary artery with left to right collaterals and EF of 35-45%. F  Echo in December showed improvement in EF to 50% with moderate MR  exercising 6 days/week  Tach 90-100 He had mentioned these on previous office visits and previously declined event monitor  3 in march, 2 in feb episodes up to rate Sometimes lasting for several hours Does Valsalva maneuver and typically will resolve We will track the heart rate using his Fitbit  Having frequent episodes of orthostasis Dry mouth, hoarse voice   EKG personally reviewed by myself on todays visit Shows normal sinus rhythm rate 70 bpm no significant ST or T-wave changes old inferior MI  Other past medical history reviewed Previous episode of tachycardia, woke up in the middle of the night  Approximately August 22, 2017, called our office the next day event monitor was offered, he declined at the time, has not had any further episodes of tachycardia Reports that he did Valsalva and arrhythmia resolved   March 2018 started developing exertional chest pain with shortness of  breath Several times had to stop himself to catch his breath, weight for chest discomfort to resolve Always exertional related  Stress test June 23, 2017 Exercise myocardial perfusion imaging study with no significant ischemia Large region of fixed defect in the inferior and apical wall consistent with previous MI. Inferior and apical wall hypokinesis.  EF estimated at 45% No EKG changes concerning for ischemia at peak stress or in recovery. Target heart rate achieved. Moderate risk scan secondary to size of old infarct.  Cardiac catheterization June 30, 2017  Prox LAD lesion, 30 %stenosed.  Prox Cx lesion, 50 %stenosed.  Prox RCA lesion, 100 %stenosed.  The left ventricular ejection fraction is 35-45% by visual estimate.   PMH:   has a past medical history of Coronary artery disease, Depression with anxiety, Gastric ulcer, acute with hemorrhage (2001), H/O congenital atrial septal defect (ASD) repair, Helicobacter pylori gastritis, Heme positive stool, History of gout, History of peptic ulcer disease, History of PSVT (paroxysmal supraventricular tachycardia), Hyperlipidemia, Hypertension, Ischemic cardiomyopathy, Obstructive sleep apnea, Perennial allergic rhinitis, and Tobacco abuse.  PSH:    Past Surgical History:  Procedure Laterality Date  . ASD REPAIR, OSTIUM PRIMUM  1966  . 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 LEFT HEART CATH AND CORONARY ANGIOGRAPHY Left 06/30/2017   Procedure: LEFT HEART CATH AND CORONARY ANGIOGRAPHY;  Surgeon: Minna Merritts, MD;  Location: [place] [facility];  Service: Cardiovascular;  Laterality: Left;  . open heart surgery  1966   Dr. Annamaria Boots ([facility]); was PFO repair--has been normal in  f/u  . PALATE / UVULA BIOPSY / EXCISION  2000   Dr.Sprenhe [facility])  . TONSILLECTOMY AND ADENOIDECTOMY  1959    Current Outpatient Medications  Medication Sig Dispense Refill  . acetaminophen (TYLENOL) 500 MG tablet Take 2 tablets (1,000  mg total) daily by mouth. 60 tablet 0  . aspirin EC 81 MG tablet Take 81 mg by mouth daily.    Marland Kitchen buPROPion (WELLBUTRIN XL) 300 MG 24 hr tablet TAKE ONE TABLET BY MOUTH DAILY 90 tablet 1  . hydrochlorothiazide (HYDRODIURIL) 25 MG tablet TAKE ONE TABLET BY MOUTH ONCE DAILY 90 tablet 2  . isosorbide mononitrate (IMDUR) 30 MG 24 hr tablet TAKE 1/2 TABLET BY MOUTH EVERY DAY 15 tablet 3  . metoprolol succinate (TOPROL-XL) 100 MG 24 hr tablet Take 1 tablet (100 mg total) by mouth daily. 90 tablet 3  . naphazoline-glycerin (CLEAR EYES) 0.012-0.2 % SOLN Place 1-2 drops into both eyes 4 (four) times daily as needed (for redness).    . nitroGLYCERIN (NITROSTAT) 0.4 MG SL tablet Place 0.4 mg under the tongue every 5 (five) minutes as needed for chest pain.    Marland Kitchen senna (SENOKOT) 8.6 MG tablet Take 2 tablets by mouth at bedtime.    . simvastatin (ZOCOR) 40 MG tablet TAKE ONE TABLET BY MOUTH ONCE DAILY 90 tablet 2  . spironolactone (ALDACTONE) 25 MG tablet Take 1 tablet (25 mg total) by mouth daily. 90 tablet 3  . traZODone (DESYREL) 50 MG tablet Take one tablet by mouth at bedtime as needed for insomnia 90 tablet 1  . losartan (COZAAR) 25 MG tablet Take 1 tablet (25 mg total) by mouth daily. 90 tablet 3   No current facility-administered medications for this visit.      Allergies:   Pollen extract   Social History:  The patient  reports that he quit smoking about 2 years ago. He quit after 40.00 years of use. He has never used smokeless tobacco. He reports that he does not drink alcohol or use drugs.   Family History:   family history includes Arthritis in his mother; Failure to thrive in his mother.    Review of Systems: Review of Systems  Constitutional: Negative.   Respiratory: Negative.   Cardiovascular: Negative.   Gastrointestinal: Negative.   Musculoskeletal: Negative.   Neurological: Positive for dizziness.  Psychiatric/Behavioral: Negative.   All other systems reviewed and are  negative.    PHYSICAL EXAM: VS:  BP 110/60 (BP Location: Left Arm, Patient Position: Sitting, Cuff Size: Normal)   Ht 6\' 1"  (1.854 m)   Wt 214 lb 8 oz (97.3 kg)   BMI 28.30 kg/m  , BMI Body mass index is 28.3 kg/m. Constitutional:  oriented to person, place, and time. No distress.  HENT:  Head: Normocephalic and atraumatic.  Eyes:  no discharge. No scleral icterus.  Neck: Normal range of motion. Neck supple. No JVD present.  Cardiovascular: Normal rate, regular rhythm, normal heart sounds and intact distal pulses. Exam reveals no gallop and no friction rub. No edema No murmur heard. Pulmonary/Chest: Effort normal and breath sounds normal. No stridor. No respiratory distress.  no wheezes.  no rales.  no tenderness.  Abdominal: Soft.  no distension.  no tenderness.  Musculoskeletal: Normal range of motion.  no  tenderness or deformity.  Neurological:  normal muscle tone. Coordination normal. No atrophy Skin: Skin is warm and dry. No rash noted. not diaphoretic.  Psychiatric:  normal mood and affect. behavior is normal. Thought content normal.  Recent Labs: 12/09/2017: ALT 11; BUN 21; Creat 1.41; Hemoglobin 13.5; Platelets 238; Potassium 4.2; Sodium 138    Lipid Panel Lab Results  Component Value Date   CHOL 126 12/09/2017   HDL 40 (L) 12/09/2017   LDLCALC 63 12/09/2017   TRIG 151 (H) 12/09/2017      Wt Readings from Last 3 Encounters:  01/10/18 214 lb 8 oz (97.3 kg)  12/13/17 215 lb (97.5 kg)  10/11/17 218 lb (98.9 kg)       ASSESSMENT AND PLAN:  Cad , with stable angina Long discussion with him, Does not feel 100% but does not want to go through PCI of his occluded RCA  Tobacco abuse - Plan: EKG 12-Lead Reports that he stopped smoking November 2017  Stable, has not restarted  Hyperglycemia - Plan: EKG 12-Lead Weight stable, he continues to exercise  Essential hypertension, benign - Plan: EKG 12-Lead Blood pressure low having orthostasis symptoms We will  hold the HCTZ  Mixed hyperlipidemia - Plan: EKG 12-Lead Recommended he add Zetia Stay on simvastatin  History of atrial septal defect repair - Plan: EKG 12-Lead  repair at age 54 . No indication of residual defect  Cardiomyopathy We will stop HCTZ Appears euvolemic on today's visit Check BMP in 1 month  Obstructive sleep apnea on CPAP - Plan: EKG 12-Lead Compliant with his CPAP  Disposition:   F/U 12 months     Total encounter time more than 25 minutes  Greater than 50% was spent in counseling and coordination of care with the patient    Orders Placed This Encounter  Procedures  . EKG 12-Lead     Signed, Esmond Plants, M.D., Ph.D. 01/10/2018  [place], [place]

## 2018-01-10 ENCOUNTER — Ambulatory Visit: Payer: Medicare HMO | Admitting: Cardiovascular Disease

## 2018-01-10 ENCOUNTER — Encounter: Payer: Self-pay | Admitting: Cardiovascular Disease

## 2018-01-10 VITALS — BP 110/60 | HR 70 | Ht 73.0 in | Wt 214.5 lb

## 2018-01-10 DIAGNOSIS — I10 Essential (primary) hypertension: Secondary | ICD-10-CM | POA: Diagnosis not present

## 2018-01-10 DIAGNOSIS — I255 Ischemic cardiomyopathy: Secondary | ICD-10-CM

## 2018-01-10 DIAGNOSIS — Z720 Tobacco use: Secondary | ICD-10-CM | POA: Diagnosis not present

## 2018-01-10 DIAGNOSIS — I25118 Atherosclerotic heart disease of native coronary artery with other forms of angina pectoris: Secondary | ICD-10-CM | POA: Diagnosis not present

## 2018-01-10 DIAGNOSIS — E782 Mixed hyperlipidemia: Secondary | ICD-10-CM

## 2018-01-10 MED ORDER — EZETIMIBE 10 MG PO TABS: 10.0000 mg | ORAL_TABLET | Freq: Every day | ORAL | 3 refills | Status: DC

## 2018-01-10 MED ORDER — PROPRANOLOL HCL 20 MG PO TABS: 20.0000 mg | ORAL_TABLET | ORAL | 0 refills | Status: DC | PRN

## 2018-01-10 NOTE — Patient Instructions (Addendum)
Search for "pulse meter" Download "instant heart rate" Make sure it is regular, not irregular   Medication Instructions:  Your physician has recommended you make the following change in your medication:  1. START Zetia 10 mg once daily (cholesterol) 2. AS NEEDED Propranolol 20 mg as needed for fast heart rates 3. HOLD the Hydrochlorothiazide for low blood pressures and take as needed   Labwork:  No new labs needed  Testing/Procedures:  No further testing at this time   Follow-Up: It was a pleasure seeing you in the office today. Please call us if you have new issues that need to be addressed before your next appt.  [phone]  Your physician wants you to follow-up in: 6 months.  You will receive a reminder letter in the mail two months in advance. If you don't receive a letter, please call our office to schedule the follow-up appointment.  If you need a refill on your cardiac medications before your next appointment, please call your pharmacy.  For educational health videos Log in to : [email or web address] Or : [email or web address], password : [email or web address]

## 2018-01-12 ENCOUNTER — Telehealth: Payer: Self-pay | Admitting: Cardiovascular Disease

## 2018-01-12 DIAGNOSIS — R002 Palpitations: Secondary | ICD-10-CM

## 2018-01-12 MED ORDER — EZETIMIBE 10 MG PO TABS: 10.0000 mg | ORAL_TABLET | Freq: Every day | ORAL | 3 refills | Status: DC

## 2018-01-12 NOTE — Telephone Encounter (Signed)
Pt calling stating he would like to wear a heart monitor He is using his phone app that he states [name] suggested  He states his app is telling him that it is irregular  Would like to wear a monitor for he is concerned   Pt is also asking if we can please send Ezetimibe to [facility] here in [place].  He will be able to get the co pay lower if we can send it there   Please advise

## 2018-01-12 NOTE — Telephone Encounter (Signed)
Spoke with patient and reviewed that I would enter order for monitor and send prescription to his pharmacy of choice. Advised that monitor will be for 2 weeks and that I would have scheduling give him a call to get this set up. He verbalized understanding, agreement with plan, and had no further questions at this time.

## 2018-01-13 NOTE — Telephone Encounter (Signed)
Pt is scheduled for 01/17/18

## 2018-01-17 ENCOUNTER — Ambulatory Visit (INDEPENDENT_AMBULATORY_CARE_PROVIDER_SITE_OTHER): Payer: Medicare HMO

## 2018-01-17 DIAGNOSIS — R002 Palpitations: Secondary | ICD-10-CM

## 2018-01-27 ENCOUNTER — Telehealth: Payer: Self-pay | Admitting: Cardiovascular Disease

## 2018-01-27 NOTE — Telephone Encounter (Signed)
No answer. Left message to call back.   

## 2018-01-27 NOTE — Telephone Encounter (Signed)
Pt calling asking if he is to take his Simvastatin along with his Ezetimibe   He would like a call back on this  Please call

## 2018-01-28 NOTE — Telephone Encounter (Signed)
Spoke with patient and reviewed that he should be taking both medications. He verbalized understanding with no further questions at this time.

## 2018-01-28 NOTE — Telephone Encounter (Signed)
Patient returning call.

## 2018-02-07 DIAGNOSIS — I471 Supraventricular tachycardia: Secondary | ICD-10-CM | POA: Diagnosis not present

## 2018-02-07 DIAGNOSIS — R000 Tachycardia, unspecified: Secondary | ICD-10-CM | POA: Diagnosis not present

## 2018-02-10 DIAGNOSIS — G4733 Obstructive sleep apnea (adult) (pediatric): Secondary | ICD-10-CM | POA: Diagnosis not present

## 2018-02-10 NOTE — Telephone Encounter (Signed)
Dr. Rockey Situ,  The patient's monitor is in your basket to review.  Please review and give recommendations so that we contact the patient   Thanks!

## 2018-02-10 NOTE — Telephone Encounter (Addendum)
Patient wants to fu on results from holter Monitor and to let is know he continues to have palpitations .

## 2018-02-16 NOTE — Telephone Encounter (Signed)
See results note. Results reviewed with patient.

## 2018-02-24 ENCOUNTER — Other Ambulatory Visit: Payer: Self-pay | Admitting: Nurse Practitioner

## 2018-03-13 DIAGNOSIS — G4733 Obstructive sleep apnea (adult) (pediatric): Secondary | ICD-10-CM | POA: Diagnosis not present

## 2018-03-17 DIAGNOSIS — L821 Other seborrheic keratosis: Secondary | ICD-10-CM | POA: Diagnosis not present

## 2018-03-17 DIAGNOSIS — Z85828 Personal history of other malignant neoplasm of skin: Secondary | ICD-10-CM | POA: Diagnosis not present

## 2018-03-17 DIAGNOSIS — L578 Other skin changes due to chronic exposure to nonionizing radiation: Secondary | ICD-10-CM | POA: Diagnosis not present

## 2018-03-17 DIAGNOSIS — D225 Melanocytic nevi of trunk: Secondary | ICD-10-CM | POA: Diagnosis not present

## 2018-03-17 DIAGNOSIS — D229 Melanocytic nevi, unspecified: Secondary | ICD-10-CM | POA: Diagnosis not present

## 2018-03-17 DIAGNOSIS — B351 Tinea unguium: Secondary | ICD-10-CM | POA: Diagnosis not present

## 2018-03-17 DIAGNOSIS — B353 Tinea pedis: Secondary | ICD-10-CM | POA: Diagnosis not present

## 2018-03-17 DIAGNOSIS — L570 Actinic keratosis: Secondary | ICD-10-CM | POA: Diagnosis not present

## 2018-03-17 DIAGNOSIS — D2272 Melanocytic nevi of left lower limb, including hip: Secondary | ICD-10-CM | POA: Diagnosis not present

## 2018-03-17 DIAGNOSIS — D2230 Melanocytic nevi of unspecified part of face: Secondary | ICD-10-CM | POA: Diagnosis not present

## 2018-04-04 ENCOUNTER — Other Ambulatory Visit: Payer: Medicare HMO

## 2018-04-04 DIAGNOSIS — I429 Cardiomyopathy, unspecified: Secondary | ICD-10-CM

## 2018-04-04 DIAGNOSIS — E782 Mixed hyperlipidemia: Secondary | ICD-10-CM | POA: Diagnosis not present

## 2018-04-04 DIAGNOSIS — R739 Hyperglycemia, unspecified: Secondary | ICD-10-CM

## 2018-04-05 LAB — BASIC METABOLIC PANEL
BUN/Creatinine Ratio: 14 (calc) (ref 6–22)
BUN: 17 mg/dL (ref 7–25)
CO2: 27 mmol/L (ref 20–32)
Calcium: 9.2 mg/dL (ref 8.6–10.3)
Chloride: 105 mmol/L (ref 98–110)
Creat: 1.22 mg/dL — ABNORMAL HIGH (ref 0.70–1.18)
Glucose, Bld: 106 mg/dL — ABNORMAL HIGH (ref 65–99)
Potassium: 4.3 mmol/L (ref 3.5–5.3)
Sodium: 138 mmol/L (ref 135–146)

## 2018-04-05 LAB — CBC WITH DIFFERENTIAL/PLATELET
Basophils Absolute: 62 cells/uL (ref 0–200)
Basophils Relative: 0.8 %
Eosinophils Absolute: 208 cells/uL (ref 15–500)
Eosinophils Relative: 2.7 %
HCT: 38.7 % (ref 38.5–50.0)
Hemoglobin: 13.1 g/dL — ABNORMAL LOW (ref 13.2–17.1)
Lymphs Abs: 1663 cells/uL (ref 850–3900)
MCH: 30.5 pg (ref 27.0–33.0)
MCHC: 33.9 g/dL (ref 32.0–36.0)
MCV: 90.2 fL (ref 80.0–100.0)
MPV: 10.1 fL (ref 7.5–12.5)
Monocytes Relative: 7.8 %
Neutro Abs: 5167 cells/uL (ref 1500–7800)
Neutrophils Relative %: 67.1 %
Platelets: 228 10*3/uL (ref 140–400)
RBC: 4.29 10*6/uL (ref 4.20–5.80)
RDW: 12.8 % (ref 11.0–15.0)
Total Lymphocyte: 21.6 %
WBC mixed population: 601 cells/uL (ref 200–950)
WBC: 7.7 10*3/uL (ref 3.8–10.8)

## 2018-04-05 LAB — LIPID PANEL
Cholesterol: 107 mg/dL (ref <200)
HDL: 43 mg/dL (ref >40)
LDL Cholesterol (Calc): 42 mg/dL (calc)
Non-HDL Cholesterol (Calc): 64 mg/dL (calc) (ref <130)
Total CHOL/HDL Ratio: 2.5 (calc) (ref <5.0)
Triglycerides: 141 mg/dL (ref <150)

## 2018-04-05 LAB — HEMOGLOBIN A1C
Hgb A1c MFr Bld: 5.8 % of total Hgb — ABNORMAL HIGH (ref <5.7)
Mean Plasma Glucose: 120 (calc)
eAG (mmol/L): 6.6 (calc)

## 2018-04-11 ENCOUNTER — Other Ambulatory Visit: Payer: Self-pay | Admitting: Internal Medicine

## 2018-04-12 DIAGNOSIS — G4733 Obstructive sleep apnea (adult) (pediatric): Secondary | ICD-10-CM | POA: Diagnosis not present

## 2018-04-14 ENCOUNTER — Encounter: Payer: Self-pay | Admitting: Internal Medicine

## 2018-04-14 ENCOUNTER — Ambulatory Visit (INDEPENDENT_AMBULATORY_CARE_PROVIDER_SITE_OTHER): Payer: Medicare HMO | Admitting: Internal Medicine

## 2018-04-14 VITALS — BP 114/66 | HR 60 | Temp 97.9°F | Ht 73.0 in | Wt 214.8 lb

## 2018-04-14 DIAGNOSIS — Z23 Encounter for immunization: Secondary | ICD-10-CM

## 2018-04-14 DIAGNOSIS — I479 Paroxysmal tachycardia, unspecified: Secondary | ICD-10-CM | POA: Insufficient documentation

## 2018-04-14 DIAGNOSIS — E782 Mixed hyperlipidemia: Secondary | ICD-10-CM | POA: Diagnosis not present

## 2018-04-14 DIAGNOSIS — G4733 Obstructive sleep apnea (adult) (pediatric): Secondary | ICD-10-CM | POA: Diagnosis not present

## 2018-04-14 DIAGNOSIS — I471 Supraventricular tachycardia, unspecified: Secondary | ICD-10-CM

## 2018-04-14 DIAGNOSIS — Z9989 Dependence on other enabling machines and devices: Secondary | ICD-10-CM | POA: Diagnosis not present

## 2018-04-14 DIAGNOSIS — R69 Illness, unspecified: Secondary | ICD-10-CM | POA: Diagnosis not present

## 2018-04-14 DIAGNOSIS — F324 Major depressive disorder, single episode, in partial remission: Secondary | ICD-10-CM

## 2018-04-14 DIAGNOSIS — R739 Hyperglycemia, unspecified: Secondary | ICD-10-CM

## 2018-04-14 DIAGNOSIS — I25110 Atherosclerotic heart disease of native coronary artery with unstable angina pectoris: Secondary | ICD-10-CM

## 2018-04-14 DIAGNOSIS — I429 Cardiomyopathy, unspecified: Secondary | ICD-10-CM

## 2018-04-14 DIAGNOSIS — I10 Essential (primary) hypertension: Secondary | ICD-10-CM

## 2018-04-14 MED ORDER — TETANUS-DIPHTH-ACELL PERTUSSIS 5-2.5-18.5 LF-MCG/0.5 IM SUSP: 0.5000 mL | Freq: Once | INTRAMUSCULAR | 0 refills | Status: AC

## 2018-04-14 NOTE — Patient Instructions (Signed)
Fat and Cholesterol Restricted Diet Getting too much fat and cholesterol in your diet may cause health problems. Following this diet helps keep your fat and cholesterol at normal levels. This can keep you from getting sick. What types of fat should I choose?  Choose monosaturated and polyunsaturated fats. These are found in foods such as olive oil, canola oil, flaxseeds, walnuts, almonds, and seeds.  Eat more omega-3 fats. Good choices include salmon, mackerel, sardines, tuna, flaxseed oil, and ground flaxseeds.  Limit saturated fats. These are in animal products such as meats, butter, and cream. They can also be in plant products such as palm oil, palm kernel oil, and coconut oil.  Avoid foods with partially hydrogenated oils in them. These contain trans fats. Examples of foods that have trans fats are stick margarine, some tub margarines, cookies, crackers, and other baked goods. What general guidelines do I need to follow?  Check food labels. Look for the words "trans fat" and "saturated fat."  When preparing a meal: ? Fill half of your plate with vegetables and green salads. ? Fill one fourth of your plate with whole grains. Look for the word "whole" as the first word in the ingredient list. ? Fill one fourth of your plate with lean protein foods.  Eat more foods that have fiber, like apples, carrots, beans, peas, and barley.  Eat more home-cooked foods. Eat less at restaurants and buffets.  Limit or avoid alcohol.  Limit foods high in starch and sugar.  Limit fried foods.  Cook foods without frying them. Baking, boiling, grilling, and broiling are all great options.  Lose weight if you are overweight. Losing even a small amount of weight can help your overall health. It can also help prevent diseases such as diabetes and heart disease. What foods can I eat? Grains Whole grains, such as whole wheat or whole grain breads, crackers, cereals, and pasta. Unsweetened oatmeal,  bulgur, barley, quinoa, or brown rice. Corn or whole wheat flour tortillas. Vegetables Fresh or frozen vegetables (raw, steamed, roasted, or grilled). Green salads. Fruits All fresh, canned (in natural juice), or frozen fruits. Meat and Other Protein Products Ground beef (85% or leaner), grass-fed beef, or beef trimmed of fat. Skinless chicken or turkey. Ground chicken or turkey. Pork trimmed of fat. All fish and seafood. Eggs. Dried beans, peas, or lentils. Unsalted nuts or seeds. Unsalted canned or dry beans. Dairy Low-fat dairy products, such as skim or 1% milk, 2% or reduced-fat cheeses, low-fat ricotta or cottage cheese, or plain low-fat yogurt. Fats and Oils Tub margarines without trans fats. Light or reduced-fat mayonnaise and salad dressings. Avocado. Olive, canola, sesame, or safflower oils. Natural peanut or almond butter (choose ones without added sugar and oil). The items listed above may not be a complete list of recommended foods or beverages. Contact your dietitian for more options. What foods are not recommended? Grains White bread. White pasta. White rice. Cornbread. Bagels, pastries, and croissants. Crackers that contain trans fat. Vegetables White potatoes. Corn. Creamed or fried vegetables. Vegetables in a cheese sauce. Fruits Dried fruits. Canned fruit in light or heavy syrup. Fruit juice. Meat and Other Protein Products Fatty cuts of meat. Ribs, chicken wings, bacon, sausage, bologna, salami, chitterlings, fatback, hot dogs, bratwurst, and packaged luncheon meats. Liver and organ meats. Dairy Whole or 2% milk, cream, half-and-half, and cream cheese. Whole milk cheeses. Whole-fat or sweetened yogurt. Full-fat cheeses. Nondairy creamers and whipped toppings. Processed cheese, cheese spreads, or cheese curds. Sweets and Desserts Corn   syrup, sugars, honey, and molasses. Candy. Jam and jelly. Syrup. Sweetened cereals. Cookies, pies, cakes, donuts, muffins, and ice  cream. Fats and Oils Butter, stick margarine, lard, shortening, ghee, or bacon fat. Coconut, palm kernel, or palm oils. Beverages Alcohol. Sweetened drinks (such as sodas, lemonade, and fruit drinks or punches). The items listed above may not be a complete list of foods and beverages to avoid. Contact your dietitian for more information. This information is not intended to replace advice given to you by your health care provider. Make sure you discuss any questions you have with your health care provider. Document Released: 03/22/2012 Document Revised: 05/28/2016 Document Reviewed: 12/21/2013 Elsevier Interactive Patient Education  2018 Elsevier Inc.  

## 2018-04-14 NOTE — Progress Notes (Signed)
Location:  [facility] clinic Provider:  Shamari Lofquist L. Mariea Clonts, D.O., C.M.D.  Code Status: DNR Goals of Care:  Advanced Directives 04/14/2018  Does Patient Have a Medical Advance Directive? Yes  Type of Advance Directive Living will  Does patient want to make changes to medical advance directive? -  Copy of [place] in Chart? -  Would patient like information on creating a medical advance directive? -   Chief Complaint  Patient presents with  . Medical Management of Chronic Issues    Pt is being seen for a 4 month routine visit. Pt wants to discuss blood work.   . Audit C Screening    Score of 0    HPI: Patient is a 72 y.o. male seen today for medical management of chronic diseases.    He is less worried about his heart than he was.  He remains concerned.  He's doing everything he can to stay here longer.  He is walking a couple of miles a day.  He's less winded going up the hills.  Some chest pains.  He wore a monitor and his HR would go from 60-100 in no time.  There were only a couple episodes seen.  He was started on propranolol.  Knows to massage his carotids and exposure to cold.  It does drop immediately back.  That scares him.  Then he gets angina after he has an episode.  Knows about his 30% blockage.  LDL is down to 42 with addition of zetia and zocor.  Dr. Rockey Situ is seeing him every 6 mos instead of 3 now.  4 Supraventricular Tachycardia/atrial tachycardia runs occurred, the run with the fastest interval lasting 5 beats with a max rate of 146 bpm,  The longest lasting 12.2 secs with an avg rate of 104 bpm.  Was worried b/c he had VT a few times when he was drinking--reports he was shocked back into rhythm then.  He's sober 11-12 years after that.  He is hoarse today--it's been the past three days--throat was a little sore last night.  Says that's been since his heart attack.  It used to be happening more often.    He is going to his AA groups.  He has not  stopped--goes 4-5 times a week.  Says mood is pretty good.  Sleeps well, but does try to go w/o taking it and sleeps well then also.  Sometimes needs to urinate.    Going to podiatry next week.    Says he has had [place] ice regularly mango.  Thinks that's to blame for the hba1c.    Past Medical History:  Diagnosis Date  . Coronary artery disease    a. 06/2017 MV: EF 45%, large inf and apical defect w/o ischemia;  b. 06/2017 Cath: LM nl, LAD 30ost, LCX 50p, OM1/2 ok, RCA 100p w/ L->R collats. EF 35%.  . Depression with anxiety   . Gastric ulcer, acute with hemorrhage 2001   Dr. Sharlet Salina, [facility]  . H/O congenital atrial septal defect (ASD) repair   . Helicobacter pylori gastritis   . Heme positive stool   . History of gout   . History of peptic ulcer disease    had bleeding ulcers requiring transfusion  . History of PSVT (paroxysmal supraventricular tachycardia)   . Hyperlipidemia   . Hypertension   . Ischemic cardiomyopathy    a. 06/2017 LV gram: EF 35%.  . Obstructive sleep apnea    on CPAP  . Perennial allergic  rhinitis   . Tobacco abuse     Past Surgical History:  Procedure Laterality Date  . ASD REPAIR, OSTIUM PRIMUM  1966  . 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 LEFT HEART CATH AND CORONARY ANGIOGRAPHY Left 06/30/2017   Procedure: LEFT HEART CATH AND CORONARY ANGIOGRAPHY;  Surgeon: Minna Merritts, MD;  Location: [facility];  Service: Cardiovascular;  Laterality: Left;  . open heart surgery  1966   Dr. Annamaria Boots ([facility]); was PFO repair--has been normal in f/u  . PALATE / UVULA BIOPSY / EXCISION  2000   Dr.Sprenhe [facility])  . TONSILLECTOMY AND ADENOIDECTOMY  1959    Allergies  Allergen Reactions  . Pollen Extract Other (See Comments)    Stuffy nose/itchy throat (Seasonal allergies)    Outpatient Encounter Medications as of 04/14/2018  Medication Sig  . acetaminophen (TYLENOL) 500 MG tablet Take 2 tablets (1,000 mg total) daily by mouth.  Marland Kitchen  aspirin EC 81 MG tablet Take 81 mg by mouth daily.  Marland Kitchen buPROPion (WELLBUTRIN XL) 300 MG 24 hr tablet TAKE ONE TABLET BY MOUTH DAILY  . ezetimibe (ZETIA) 10 MG tablet Take 10 mg by mouth daily.  . isosorbide mononitrate (IMDUR) 30 MG 24 hr tablet TAKE 1/2 TABLET BY MOUTH EVERY DAY  . losartan (COZAAR) 25 MG tablet Take 25 mg by mouth daily.  . metoprolol succinate (TOPROL-XL) 100 MG 24 hr tablet Take 1 tablet (100 mg total) by mouth daily.  . naphazoline-glycerin (CLEAR EYES) 0.012-0.2 % SOLN Place 1-2 drops into both eyes 4 (four) times daily as needed (for redness).  . nitroGLYCERIN (NITROSTAT) 0.4 MG SL tablet Place 0.4 mg under the tongue every 5 (five) minutes as needed for chest pain.  Marland Kitchen propranolol (INDERAL) 20 MG tablet Take 1 tablet (20 mg total) by mouth as needed (As needed for fast heart rates).  . senna (SENOKOT) 8.6 MG tablet Take 2 tablets by mouth at bedtime.  . simvastatin (ZOCOR) 40 MG tablet TAKE ONE TABLET BY MOUTH ONCE DAILY  . spironolactone (ALDACTONE) 25 MG tablet Take 1 tablet (25 mg total) by mouth daily.  . Tdap (BOOSTRIX) 5-2.5-18.5 LF-MCG/0.5 injection Inject 0.5 mLs into the muscle once for 1 dose.  . traZODone (DESYREL) 50 MG tablet Take one tablet by mouth at bedtime as needed for insomnia  . [DISCONTINUED] Tdap (BOOSTRIX) 5-2.5-18.5 LF-MCG/0.5 injection Inject 0.5 mLs into the muscle once.  . [DISCONTINUED] ezetimibe (ZETIA) 10 MG tablet Take 1 tablet (10 mg total) by mouth daily.  . [DISCONTINUED] hydrochlorothiazide (HYDRODIURIL) 25 MG tablet TAKE ONE TABLET BY MOUTH ONCE DAILY  . [DISCONTINUED] losartan (COZAAR) 25 MG tablet Take 1 tablet (25 mg total) by mouth daily.   No facility-administered encounter medications on file as of 04/14/2018.     Review of Systems:  Review of Systems  Constitutional: Negative for chills, fever and malaise/fatigue.  HENT: Negative for congestion.        Hoarseness  Eyes: Negative for blurred vision.       Glasses    Respiratory: Positive for cough. Negative for shortness of breath.        Dyspnea on exertion improved  Cardiovascular: Positive for chest pain and palpitations. Negative for orthopnea, claudication, leg swelling and PND.  Gastrointestinal: Negative for abdominal pain, blood in stool, constipation, diarrhea and melena.  Genitourinary: Negative for dysuria.  Musculoskeletal: Positive for joint pain. Negative for falls.       Some aches in his knees  after 6 days of walking, needs one day break  Skin: Negative for itching and rash.  Neurological: Negative for dizziness and loss of consciousness.  Endo/Heme/Allergies: Does not bruise/bleed easily.  Psychiatric/Behavioral: Negative for depression and memory loss. The patient is nervous/anxious. The patient does not have insomnia.        Reports improvement in mood, adjusting better to his heart disease    Health Maintenance  Topic Date Due  . TETANUS/TDAP  07/31/1964  . Hepatitis C Screening  04/15/2019 (Originally 23-May-1946)  . INFLUENZA VACCINE  05/05/2018  . COLONOSCOPY  03/22/2019  . PNA vac Low Risk Adult  Completed    Physical Exam: Vitals:   04/14/18 1037  BP: 114/66  Pulse: 60  Temp: 97.9 F (36.6 C)  TempSrc: Oral  SpO2: 98%  Weight: 214 lb 12.8 oz (97.4 kg)  Height: 6\' 1"  (1.854 m)   Body mass index is 28.34 kg/m. Physical Exam  Constitutional: He is oriented to person, place, and time. He appears well-developed and well-nourished. No distress.  HENT:  Head: Normocephalic and atraumatic.  Small amount of postnasal drip and erythema of posterior pharynx  Cardiovascular: Normal rate, regular rhythm, normal heart sounds and intact distal pulses.  Pulmonary/Chest: Effort normal and breath sounds normal. No respiratory distress.  Abdominal: Bowel sounds are normal.  Musculoskeletal: Normal range of motion.  Neurological: He is alert and oriented to person, place, and time.  Skin: Skin is warm and dry.  Psychiatric:  He has a normal mood and affect.    Labs reviewed: Basic Metabolic Panel: Recent Labs    08/12/17 0913 12/09/17 0907 04/04/18 0843  NA 137 138 138  K 4.2 4.2 4.3  CL 102 101 105  CO2 29 29 27   GLUCOSE 108* 107* 106*  BUN 23 21 17   CREATININE 1.15 1.41* 1.22*  CALCIUM 8.9 9.5 9.2   Liver Function Tests: Recent Labs    08/12/17 0913 12/09/17 0907  AST 16 13  ALT 16 11  BILITOT 0.5 0.8  PROT 6.2 6.1   No results for input(s): LIPASE, AMYLASE in the last 8760 hours. No results for input(s): AMMONIA in the last 8760 hours. CBC: Recent Labs    08/12/17 0913 12/09/17 0907 04/04/18 0843  WBC 7.0 9.8 7.7  NEUTROABS 4,396 7,615 5,167  HGB 13.9 13.5 13.1*  HCT 39.6 39.7 38.7  MCV 89.8 91.1 90.2  PLT 233 238 228   Lipid Panel: Recent Labs    08/12/17 0913 12/09/17 0907 04/04/18 0843  CHOL 128 126 107  HDL 47 40* 43  LDLCALC 61 63 42  TRIG 115 151* 141  CHOLHDL 2.7 3.2 2.5   Lab Results  Component Value Date   HGBA1C 5.8 (H) 04/04/2018   Assessment/Plan 1. Cardiomyopathy, unspecified type [facility]) -suspect he may have some of this due to his prior alcohol abuse -came from cardiology visits  2. Coronary artery disease involving native coronary artery of native heart with unstable angina pectoris ([place]) -LAD 30% occluded -pt with fewer chest pain episodes and less dyspnea on exertion -able to now walk 2 miles 6 days per week -is adjusting to his chronic disease  3. Depression, major, single episode, in partial remission ([place]) -certainly improved from last visit -still struggles coping with chronic disease and aging process -cont wellbutrin routinely and trazodone prn for sleep  4. Essential hypertension, benign -bp at goal, cont current regimen  5. Mixed hyperlipidemia -lipids even better with zocor and zetia  6. Hyperglycemia -due to  [place] ice daily for a while, he agrees to cut down on this to lower his glucose   7. Obstructive sleep apnea on  CPAP -cont routine CPAP use  8.  Paroxysmal SVT -per heart monitor worn through cardiology--had 4 of these episodes -cont vagal maneuvers and prn propanolol for this when needed -educated that this is not typically considered to be a dangerous arrhythmia vs the VT he had when he was drinking excess alcohol  9.  tdap Rx given to take to pharmacy of choice   Labs/tests ordered:  No orders of the defined types were placed in this encounter.  Did not order labs b/c Dr. Rockey Situ may get more labs in the interim and I didn't want to duplicate  Next appt:  36/46/8032 before they leave for the beach  [place]. Phillipe Clemon, D.O. [facility] [address], [place] [address] Cell Phone (Mon-Fri 8am-5pm):  [phone] On Call:  [phone] & follow prompts after 5pm & weekends Office Phone:  [phone] Office Fax:  [phone]

## 2018-04-18 ENCOUNTER — Ambulatory Visit: Payer: Medicare HMO | Admitting: Podiatry

## 2018-04-18 ENCOUNTER — Encounter: Payer: Self-pay | Admitting: Podiatry

## 2018-04-18 DIAGNOSIS — B351 Tinea unguium: Secondary | ICD-10-CM

## 2018-04-18 DIAGNOSIS — M79674 Pain in right toe(s): Secondary | ICD-10-CM

## 2018-04-18 NOTE — Progress Notes (Signed)
This patient presents to the office with chief complaint of long thick painful ingrowing toenail, right foot.  He says the nail is thick and long and is painful walking and wearing his shoes.  He says he has attempted over-the-counter medication for treatment of fungal toenail.  He says there is no improvement.  He presents the office today for an evaluation and treatment of this long, thick, ingrowing toenail right big toe.  General Appearance  Alert, conversant and in no acute stress.  Vascular  Dorsalis pedis and posterior tibial  pulses are palpable  bilaterally.  Capillary return is within normal limits  bilaterally. Temperature is within normal limits  bilaterally.  Neurologic  Senn-Weinstein monofilament wire test within normal limits  bilaterally. Muscle power within normal limits bilaterally.  Nails Thick disfigured discolored nails with subungual debris  Hallux through fifth nail right foot. No evidence of bacterial infection or drainage bilaterally.  Orthopedic  No limitations of motion of motion feet .  No crepitus or effusions noted.  No bony pathology or digital deformities noted.  Skin  normotropic skin with no porokeratosis noted bilaterally.  No signs of infections or ulcers noted.    Onychomycosis right foot  IE  Debride nails right foot  .  Discussed conservative versus surgical treatment of this thick mycotic nail.  We decided to treat him conservatively today and in the future if he desires surgical correction. We will discuss that at that time.   Gardiner Barefoot DPM

## 2018-04-29 ENCOUNTER — Other Ambulatory Visit: Payer: Self-pay

## 2018-04-29 MED ORDER — ISOSORBIDE MONONITRATE ER 30 MG PO TB24: 15.0000 mg | ORAL_TABLET | Freq: Every day | ORAL | 2 refills | Status: DC

## 2018-05-10 ENCOUNTER — Emergency Department: Payer: Medicare HMO

## 2018-05-10 ENCOUNTER — Emergency Department
Admission: EM | Admit: 2018-05-10 | Discharge: 2018-05-10 | Disposition: A | Discharge status: Home / Self Care | Payer: Medicare HMO | Attending: Emergency Medicine | Admitting: Emergency Medicine

## 2018-05-10 ENCOUNTER — Other Ambulatory Visit: Payer: Self-pay

## 2018-05-10 ENCOUNTER — Telehealth: Payer: Self-pay | Admitting: Cardiovascular Disease

## 2018-05-10 ENCOUNTER — Encounter: Payer: Self-pay | Admitting: Emergency Medicine

## 2018-05-10 DIAGNOSIS — Z87891 Personal history of nicotine dependence: Secondary | ICD-10-CM | POA: Diagnosis not present

## 2018-05-10 DIAGNOSIS — R002 Palpitations: Secondary | ICD-10-CM | POA: Diagnosis not present

## 2018-05-10 DIAGNOSIS — I10 Essential (primary) hypertension: Secondary | ICD-10-CM | POA: Insufficient documentation

## 2018-05-10 DIAGNOSIS — Z79899 Other long term (current) drug therapy: Secondary | ICD-10-CM | POA: Insufficient documentation

## 2018-05-10 DIAGNOSIS — Z85828 Personal history of other malignant neoplasm of skin: Secondary | ICD-10-CM | POA: Insufficient documentation

## 2018-05-10 DIAGNOSIS — Z7982 Long term (current) use of aspirin: Secondary | ICD-10-CM | POA: Insufficient documentation

## 2018-05-10 DIAGNOSIS — R079 Chest pain, unspecified: Secondary | ICD-10-CM | POA: Insufficient documentation

## 2018-05-10 DIAGNOSIS — R0602 Shortness of breath: Secondary | ICD-10-CM | POA: Insufficient documentation

## 2018-05-10 LAB — COMPREHENSIVE METABOLIC PANEL
ALBUMIN: 4 g/dL (ref 3.5–5.0)
ALT: 18 U/L (ref 0–44)
AST: 21 U/L (ref 15–41)
Alkaline Phosphatase: 41 U/L (ref 38–126)
Anion gap: 4 — ABNORMAL LOW (ref 5–15)
BILIRUBIN TOTAL: 0.7 mg/dL (ref 0.3–1.2)
BUN: 23 mg/dL (ref 8–23)
CHLORIDE: 104 mmol/L (ref 98–111)
CO2: 28 mmol/L (ref 22–32)
Calcium: 9 mg/dL (ref 8.9–10.3)
Creatinine, Ser: 1.36 mg/dL — ABNORMAL HIGH (ref 0.61–1.24)
GFR calc Af Amer: 58 mL/min — ABNORMAL LOW (ref >60)
GFR calc non Af Amer: 50 mL/min — ABNORMAL LOW (ref >60)
GLUCOSE: 113 mg/dL — AB (ref 70–99)
Potassium: 4.5 mmol/L (ref 3.5–5.1)
Sodium: 136 mmol/L (ref 135–145)
Total Protein: 6.7 g/dL (ref 6.5–8.1)

## 2018-05-10 LAB — CBC WITH DIFFERENTIAL/PLATELET
Basophils Absolute: 0.1 10*3/uL (ref 0–0.1)
Basophils Relative: 1 %
EOS PCT: 2 %
Eosinophils Absolute: 0.2 10*3/uL (ref 0–0.7)
HCT: 40 % (ref 40.0–52.0)
Hemoglobin: 13.7 g/dL (ref 13.0–18.0)
LYMPHS ABS: 1.9 10*3/uL (ref 1.0–3.6)
LYMPHS PCT: 21 %
MCH: 31.8 pg (ref 26.0–34.0)
MCHC: 34.3 g/dL (ref 32.0–36.0)
MCV: 92.8 fL (ref 80.0–100.0)
MONOS PCT: 8 %
Monocytes Absolute: 0.7 10*3/uL (ref 0.2–1.0)
NEUTROS ABS: 6 10*3/uL (ref 1.4–6.5)
Neutrophils Relative %: 68 %
PLATELETS: 227 10*3/uL (ref 150–440)
RBC: 4.31 MIL/uL — AB (ref 4.40–5.90)
RDW: 13.6 % (ref 11.5–14.5)
WBC: 8.9 10*3/uL (ref 3.8–10.6)

## 2018-05-10 LAB — LIPASE, BLOOD: Lipase: 30 U/L (ref 11–51)

## 2018-05-10 LAB — TROPONIN I
Troponin I: <0.03 ng/mL (ref <0.03)
Troponin I: <0.03 ng/mL (ref <0.03)

## 2018-05-10 NOTE — ED Provider Notes (Signed)
[facility] Emergency Department Provider Note  Time seen: 3:41 PM  I have reviewed the triage vital signs and the nursing notes.   HISTORY  Chief Complaint Chest Pain    HPI Mitchell Stephens. is a 72 y.o. male with a past medical history of CAD status post MI 1 year ago, PSVT, hypertension, hyperlipidemia, presents to the emergency department for intermittent chest pain and palpitations.  According to the patient he has a history of paroxysmal SVT.  States he is prescribed propranolol for PSVT as well as nitroglycerin for intermittent angina.  States earlier this week he had an episode where he was feeling palpitations he would feel his heart race to around 130 bpm, took his medication he began feeling some pressure into his left jaw and down the back of both arms, but states it past after several minutes.  He had not had any further symptoms until today when around 1 PM he began feeling short of breath, states he was once again feeling some pressure in his left jaw and into the back of his arms.  Took his medications, states he went to the cardiology office to see if he could be seen but no one could see him so they sent him to the emergency department.  Here the patient appears well, states by the time he got here all of his symptoms had resolved.  Patient has no complaints at this time.   Past Medical History:  Diagnosis Date  . Coronary artery disease    a. 06/2017 MV: EF 45%, large inf and apical defect w/o ischemia;  b. 06/2017 Cath: LM nl, LAD 30ost, LCX 50p, OM1/2 ok, RCA 100p w/ L->R collats. EF 35%.  . Depression with anxiety   . Gastric ulcer, acute with hemorrhage 2001   Dr. Sharlet Salina, [facility]  . H/O congenital atrial septal defect (ASD) repair   . Helicobacter pylori gastritis   . Heme positive stool   . History of gout   . History of peptic ulcer disease    had bleeding ulcers requiring transfusion  . History of PSVT (paroxysmal supraventricular  tachycardia)   . Hyperlipidemia   . Hypertension   . Ischemic cardiomyopathy    a. 06/2017 LV gram: EF 35%.  . Obstructive sleep apnea    on CPAP  . Perennial allergic rhinitis   . Tobacco abuse     Patient Active Problem List   Diagnosis Date Noted  . Paroxysmal SVT (supraventricular tachycardia) ([place]) 04/14/2018  . Cardiomyopathy ([place]) 12/13/2017  . History of tobacco abuse 12/13/2017  . Atherosclerosis of native coronary artery of native heart with stable angina pectoris ([place]) 08/31/2017  . Positive cardiac stress test 06/30/2017  . History of atrial septal defect repair 10/08/2016  . Degenerative joint disease of hand 01/17/2016  . Depression, major, in remission ([place]) 01/09/2016  . Metacarpophalangeal joint pain 01/09/2016  . Essential hypertension, benign 10/15/2014  . Squamous cell carcinoma ([place]) 10/15/2014  . Hyperlipidemia 10/15/2014  . Hyperglycemia 10/15/2014  . Chronic constipation 10/15/2014  . Obstructive sleep apnea on CPAP 10/15/2014  . Personal history of other malignant neoplasm of skin 06/23/2012    Past Surgical History:  Procedure Laterality Date  . ASD REPAIR, OSTIUM PRIMUM  1966  . 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 LEFT HEART CATH AND CORONARY ANGIOGRAPHY Left 06/30/2017   Procedure: LEFT HEART CATH AND CORONARY ANGIOGRAPHY;  Surgeon: Minna Merritts, MD;  Location: [place] INVASIVE CV  LAB;  Service: Cardiovascular;  Laterality: Left;  . open heart surgery  1966   Dr. Annamaria Boots ([facility]); was PFO repair--has been normal in f/u  . PALATE / UVULA BIOPSY / EXCISION  2000   Dr.Sprenhe [facility])  . TONSILLECTOMY AND ADENOIDECTOMY  1959    Prior to Admission medications   Medication Sig Start Date End Date Taking? Authorizing Provider  acetaminophen (TYLENOL) 500 MG tablet Take 2 tablets (1,000 mg total) daily by mouth. 08/16/17   Reed, Tiffany L, DO  aspirin EC 81 MG tablet Take 81 mg by mouth daily.    [provider]  buPROPion  (WELLBUTRIN XL) 300 MG 24 hr tablet TAKE ONE TABLET BY MOUTH DAILY 04/11/18   Reed, Tiffany L, DO  ezetimibe (ZETIA) 10 MG tablet Take 10 mg by mouth daily.    [provider]  isosorbide mononitrate (IMDUR) 30 MG 24 hr tablet Take 0.5 tablets (15 mg total) by mouth daily. 04/29/18   Theora Gianotti, NP  losartan (COZAAR) 25 MG tablet Take 25 mg by mouth daily.    [provider]  metoprolol succinate (TOPROL-XL) 100 MG 24 hr tablet Take 1 tablet (100 mg total) by mouth daily. 07/27/17   Theora Gianotti, NP  naphazoline-glycerin (CLEAR EYES) 0.012-0.2 % SOLN Place 1-2 drops into both eyes 4 (four) times daily as needed (for redness).    [provider]  nitroGLYCERIN (NITROSTAT) 0.4 MG SL tablet Place 0.4 mg under the tongue every 5 (five) minutes as needed for chest pain.    [provider]  propranolol (INDERAL) 20 MG tablet Take 1 tablet (20 mg total) by mouth as needed (As needed for fast heart rates). 01/10/18   Minna Merritts, MD  senna (SENOKOT) 8.6 MG tablet Take 2 tablets by mouth at bedtime.    [provider]  simvastatin (ZOCOR) 40 MG tablet TAKE ONE TABLET BY MOUTH ONCE DAILY 08/23/17   Reed, Tiffany L, DO  spironolactone (ALDACTONE) 25 MG tablet Take 1 tablet (25 mg total) by mouth daily. 08/31/17   Minna Merritts, MD  traZODone (DESYREL) 50 MG tablet Take one tablet by mouth at bedtime as needed for insomnia 11/19/17   Reed, Tiffany L, DO    Allergies  Allergen Reactions  . Pollen Extract Other (See Comments)    Stuffy nose/itchy throat (Seasonal allergies)    Family History  Problem Relation Age of Onset  . Failure to thrive Mother   . Arthritis Mother     Social History Social History   Tobacco Use  . Smoking status: Former Smoker    Years: 40.00    Last attempt to quit: 09/04/2015    Years since quitting: 2.6  . Smokeless tobacco: Never Used  Substance Use Topics  . Alcohol use: No    Alcohol/week:  0.0 oz    Comment: previously did drink alcohol heavily  . Drug use: No    Comment: likes to avoid pain meds, no illicit drugs used    Review of Systems Constitutional: Negative for fever. Eyes: Negative for visual complaints ENT: Negative for recent illness/congestion Cardiovascular: Mild left jaw discomfort with radiation into the back of the arms.  No chest pain. Respiratory: Mild shortness of breath which he relates to his heart racing. Gastrointestinal: Negative for abdominal pain, vomiting.  Denies nausea.  Denies diaphoresis. Musculoskeletal: Negative for leg pain or swelling. Skin: Negative for skin complaints  Neurological: Negative for headache All other ROS negative  ____________________________________________  PHYSICAL EXAM:  VITAL SIGNS: ED Triage Vitals  Enc Vitals Group     BP 05/10/18 1424 132/72     Pulse Rate 05/10/18 1424 65     Resp 05/10/18 1424 18     Temp 05/10/18 1424 (!) 97.5 F (36.4 C)     Temp Source 05/10/18 1424 Oral     SpO2 05/10/18 1424 98 %     Weight 05/10/18 1424 216 lb (98 kg)     Height 05/10/18 1424 6\' 1"  (1.854 m)     Head Circumference --      Peak Flow --      Pain Score 05/10/18 1445 0     Pain Loc --      Pain Edu? --      Excl. in [place]? --    Constitutional: Alert and oriented. Well appearing and in no distress. Eyes: Normal exam ENT   Head: Normocephalic and atraumatic   Mouth/Throat: Mucous membranes are moist. Cardiovascular: Normal rate, regular rhythm. No murmur Respiratory: Normal respiratory effort without tachypnea nor retractions. Breath sounds are clear Gastrointestinal: Soft and nontender. No distention. Musculoskeletal: Nontender with normal range of motion in all extremities. No lower extremity tenderness or edema. Neurologic:  Normal speech and language. No gross focal neurologic deficits Skin:  Skin is warm, dry and intact.  Psychiatric: Mood and affect are normal.    ____________________________________________    EKG  EKG reviewed and interpreted by myself shows normal sinus rhythm at 63 bpm with a slightly widened QRS, normal axis, normal intervals, no ST changes.  ____________________________________________    RADIOLOGY  Chest x-ray negative  ____________________________________________   INITIAL IMPRESSION / ASSESSMENT AND PLAN / ED COURSE  Pertinent labs & imaging results that were available during my care of the patient were reviewed by me and considered in my medical decision making (see chart for details).  Patient presents to the emergency department for mild shortness of breath with pressure into the left jaw and down both arms.  Patient took nitroglycerin and propranolol with symptom relief.  Denies any symptoms at this time.  However as this is the second time symptoms have occurred within the past 1 week patient is concerned.  We will discuss with cardiology.  Differential this time would include angina, stable versus unstable, ACS, pneumonia, pneumothorax.  Patient's chest x-ray is normal.  Labs including cardiac enzymes are negative x1.  As the patient's symptoms started around 1 PM we will repeat a second troponin at 5 PM and continue to closely monitor.  We will discuss with cardiology for expedited follow-up.  Patient agreeable to plan of care.  Repeat troponin is negative.  I was able discussed with cardiology.  Dr. Curt Bears.  They will see the patient in the office.  With 2- troponins I believe the patient is safe for discharge home.  Patient agreeable to plan of care remains chest pain-free in the emergency department.  ____________________________________________   FINAL CLINICAL IMPRESSION(S) / ED DIAGNOSES  Chest pain    Harvest Dark, MD 05/10/18 1827

## 2018-05-10 NOTE — Telephone Encounter (Signed)
Patient has rov 6 m fu in October and wants to know if he will need labs .    Please advise.    Patient c/o Palpitations:  High priority if patient c/o lightheadedness, shortness of breath, or chest pain  1) How long have you had palpitations/irregular HR/ Afib? Are you having the symptoms now?  1 week   2) Are you currently experiencing lightheadedness, SOB or CP?  Chest pressure sob  Interim   3) Do you have a history of afib (atrial fibrillation) or irregular heart rhythm?  No   4) Have you checked your BP or HR? (document readings if available): resting hr is 60's jumps up to 100     5. Are you experiencing any other symptoms?  Throat pain back of arm pain and chest pressure

## 2018-05-10 NOTE — ED Triage Notes (Signed)
Patient reports intermittent pressure in chest, jaw and arms on Saturday and again today. Patient states he had MI in the fall with same symptoms. Reports taking beta blockers for irregular, rapid heart rate. Patient denies dizziness, N/V, but states get short of breath when pressure comes. Patient denies any discomfort at this time.

## 2018-05-10 NOTE — Telephone Encounter (Signed)
Patient here in the office. He had an episode of chest pressure, jaw pain, arm pain and shortness of breath prior to coming into the office. He came into the office to schedule a follow up appointment. He said this is the second episode in the past week, the first was 3 days ago. He says it feels like what it did when he had his heart attack. Discussed with Christell Faith, PA-C who advised patient should go to the ER for further evaluation at this time. Patient verbalized understanding and was escorted to the ED safely.

## 2018-05-11 NOTE — Telephone Encounter (Signed)
Patient declined to be scheduled from waitlist for sooner ed fu   Patient states he was checked out in ed and things were negative.  Feeling ok to wait until September.  Patient advised to call office if he changes his mind and would like to be scheduled sooner or added back to waitlist .

## 2018-05-12 DIAGNOSIS — G4733 Obstructive sleep apnea (adult) (pediatric): Secondary | ICD-10-CM | POA: Diagnosis not present

## 2018-05-13 DIAGNOSIS — G4733 Obstructive sleep apnea (adult) (pediatric): Secondary | ICD-10-CM | POA: Diagnosis not present

## 2018-05-17 ENCOUNTER — Telehealth: Payer: Self-pay | Admitting: *Deleted

## 2018-05-17 NOTE — Telephone Encounter (Signed)
-----   Message from Minna Merritts, MD sent at 05/13/2018  1:54 PM EDT ----- Etiology of chest pain unclear, Tachycardia vs angina Could run a stress test to see if disease is worse in the past year since last cardiac cath lexiscan myoview TG  ----- Message ----- From: Constance Haw, MD Sent: 05/11/2018   8:03 AM EDT To: Minna Merritts, MD  This patient presented yesterday to the ER in [place].  He has been having what sounds like quite a bit of chest pain.  He likely needs close follow-up.  Thanks.

## 2018-05-17 NOTE — Telephone Encounter (Signed)
Patient called back. We discussed his recent chest pain that took him to the ED last week. He has not experienced it again since then. He is currently at [employer] out of town. He would like to wait and come to appointment with [name] on 06/10/18 and discuss it with him then. He will call us back if any new symptoms or concerns arise.

## 2018-05-17 NOTE — Telephone Encounter (Signed)
New Message ° ° °Pt returning call °

## 2018-05-17 NOTE — Telephone Encounter (Signed)
No answer. Left message to call back.   

## 2018-05-29 ENCOUNTER — Other Ambulatory Visit: Payer: Self-pay | Admitting: Internal Medicine

## 2018-06-09 NOTE — Progress Notes (Signed)
Cardiology Office Note  Date:  06/10/2018   ID:  Mitchell Frame., DOB 23-Jun-1946, MRN [id number]  PCP:  Gayland Curry, DO   Chief Complaint  Patient presents with  . OTHER    See telephone pt is feeling better denies chest pain and irregular HB. Meds reviewed verbally with pt.    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, Prior episodes of tachycardia associated with alcohol, none recently asd repair, age 46 Who presents for follow-up of his coronary artery disease, occluded RCA  Rare tachycardia, takes propranolol  Went to the ER with CP on 05/10/2018 Hospital records reviewed with the patient in detail Pain in the neck, arms Several episodes  No further episodes since that time In fact feels well and has been vacationing at the beach  Blood pressure running very low today 90/60 Does feel dizzy when he stands up  Nervous about changing his medications Takes all of his medications in the morning isosorbide losartan metoprolol and spironolactone  EKG personally reviewed by myself on todays visit Shows normal sinus rhythm rate 58 bpm no significant ST or T wave changes  Other past medical history reviewed stress testing 06/2017 which showed a large area of inferior apical scar without ischemia and an EF of 45%.   Diagnostic catheterization showed an occluded proximal right coronary artery with left to right collaterals and EF of 35-45%. F  Echo in December showed improvement in EF to 50% with moderate MR  exercising 6 days/week  Tach 90-100 He had mentioned these on previous office visits and previously declined event monitor  3 in march, 2 in feb episodes up to rate Sometimes lasting for several hours Does Valsalva maneuver and typically will resolve We will track the heart rate  using his Fitbit  Having frequent episodes of orthostasis Dry mouth, hoarse voice  Previous episode of tachycardia, woke up in the middle of the night  Approximately August 22, 2017, called our office the next day event monitor was offered, he declined at the time, has not had any further episodes of tachycardia Reports that he did Valsalva and arrhythmia resolved   March 2018 started developing exertional chest pain with shortness of breath Several times had to stop himself to catch his breath, weight for chest discomfort to resolve Always exertional related  Stress test June 23, 2017 Exercise myocardial perfusion imaging study with no significant ischemia Large region of fixed defect in the inferior and apical wall consistent with previous MI. Inferior and apical wall hypokinesis.  EF estimated at 45% No EKG changes concerning for ischemia at peak stress or in recovery. Target heart rate achieved. Moderate risk scan secondary to size of old infarct.  Cardiac catheterization June 30, 2017  Prox LAD lesion, 30 %stenosed.  Prox Cx lesion, 50 %stenosed.  Prox RCA lesion, 100 %stenosed.  The left ventricular ejection fraction is 35-45% by visual estimate.   PMH:   has a past medical history of Coronary artery disease, Depression with anxiety, Gastric ulcer, acute with hemorrhage (2001), H/O congenital atrial septal defect (ASD) repair, Helicobacter pylori gastritis, Heme positive stool, History of gout, History of peptic ulcer disease, History of PSVT (paroxysmal supraventricular tachycardia), Hyperlipidemia, Hypertension, Ischemic cardiomyopathy, Obstructive sleep apnea, Perennial allergic rhinitis, and Tobacco abuse.  PSH:    Past Surgical History:  Procedure Laterality Date  .  ASD REPAIR, OSTIUM PRIMUM  1966  . 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 LEFT HEART CATH AND CORONARY ANGIOGRAPHY Left 06/30/2017   Procedure: LEFT HEART CATH AND CORONARY  ANGIOGRAPHY;  Surgeon: Minna Merritts, MD;  Location: [place] CV LAB;  Service: Cardiovascular;  Laterality: Left;  . open heart surgery  1966   Dr. Annamaria Boots ([facility]); was PFO repair--has been normal in f/u  . PALATE / UVULA BIOPSY / EXCISION  2000   Dr.Sprenhe [facility])  . TONSILLECTOMY AND ADENOIDECTOMY  1959    Current Outpatient Medications  Medication Sig Dispense Refill  . aspirin EC 81 MG tablet Take 81 mg by mouth daily.    Marland Kitchen buPROPion (WELLBUTRIN XL) 300 MG 24 hr tablet TAKE ONE TABLET BY MOUTH DAILY 90 tablet 0  . ezetimibe (ZETIA) 10 MG tablet Take 10 mg by mouth daily.    . isosorbide mononitrate (IMDUR) 30 MG 24 hr tablet Take 0.5 tablets (15 mg total) by mouth daily. 15 tablet 2  . losartan (COZAAR) 25 MG tablet Take 25 mg by mouth daily.    . metoprolol succinate (TOPROL-XL) 100 MG 24 hr tablet Take 1 tablet (100 mg total) by mouth daily. 90 tablet 3  . naphazoline-glycerin (CLEAR EYES) 0.012-0.2 % SOLN Place 1-2 drops into both eyes 4 (four) times daily as needed (for redness).    . nitroGLYCERIN (NITROSTAT) 0.4 MG SL tablet Place 0.4 mg under the tongue every 5 (five) minutes as needed for chest pain.    Marland Kitchen propranolol (INDERAL) 20 MG tablet Take 1 tablet (20 mg total) by mouth as needed (As needed for fast heart rates). 60 tablet 0  . senna (SENOKOT) 8.6 MG tablet Take 2 tablets by mouth at bedtime.    . simvastatin (ZOCOR) 40 MG tablet TAKE 1 TABLET BY MOUTH EVERY DAY 90 tablet 2  . spironolactone (ALDACTONE) 25 MG tablet Take 1 tablet (25 mg total) by mouth daily. 90 tablet 3  . traZODone (DESYREL) 50 MG tablet Take one tablet by mouth at bedtime as needed for insomnia 90 tablet 1   No current facility-administered medications for this visit.      Allergies:   Pollen extract   Social History:  The patient  reports that he quit smoking about 2 years ago. He quit after 40.00 years of use. He has never used smokeless tobacco. He reports that he does not drink alcohol or  use drugs.   Family History:   family history includes Arthritis in his mother; Failure to thrive in his mother.    Review of Systems: Review of Systems  Constitutional: Negative.   Respiratory: Negative.   Cardiovascular: Negative.   Gastrointestinal: Negative.   Musculoskeletal: Negative.   Neurological: Positive for dizziness.  Psychiatric/Behavioral: Negative.   All other systems reviewed and are negative.    PHYSICAL EXAM: VS:  BP 90/60 (BP Location: Left Arm, Patient Position: Sitting, Cuff Size: Normal)   Pulse (!) 58   Ht 6\' 1"  (1.854 m)   Wt 213 lb 8 oz (96.8 kg)   BMI 28.17 kg/m  , BMI Body mass index is 28.17 kg/m.  No significant change in exam Constitutional:  oriented to person, place, and time. No distress.  HENT:  Head: Normocephalic and atraumatic.  Eyes:  no discharge. No scleral icterus.  Neck: Normal range of motion. Neck supple. No JVD present.  Cardiovascular: Normal rate, regular rhythm, normal heart sounds and intact distal pulses. Exam reveals  no gallop and no friction rub. No edema No murmur heard. Pulmonary/Chest: Effort normal and breath sounds normal. No stridor. No respiratory distress.  no wheezes.  no rales.  no tenderness.  Abdominal: Soft.  no distension.  no tenderness.  Musculoskeletal: Normal range of motion.  no  tenderness or deformity.  Neurological:  normal muscle tone. Coordination normal. No atrophy Skin: Skin is warm and dry. No rash noted. not diaphoretic.  Psychiatric:  normal mood and affect. behavior is normal. Thought content normal.    Recent Labs: 05/10/2018: ALT 18; BUN 23; Creatinine, Ser 1.36; Hemoglobin 13.7; Platelets 227; Potassium 4.5; Sodium 136    Lipid Panel Lab Results  Component Value Date   CHOL 107 04/04/2018   HDL 43 04/04/2018   LDLCALC 42 04/04/2018   TRIG 141 04/04/2018      Wt Readings from Last 3 Encounters:  06/10/18 213 lb 8 oz (96.8 kg)  05/10/18 216 lb (98 kg)  04/14/18 214 lb 12.8 oz  (97.4 kg)       ASSESSMENT AND PLAN:  Cad , with stable angina Does not feel 100% but does not want to go through PCI of his occluded RCA Recent episode of tightness in his neck and throat concerning for angina Does not want further work-up at this time Work-up in the emergency room unrevealing Nitroglycerin prescribed  Tobacco abuse - Plan: EKG 12-Lead Reports that he stopped smoking November 2017  Stable  Hyperglycemia - Plan: EKG 12-Lead Weight stable, he continues to exercise Recommended low carbohydrate diet  Essential hypertension, benign - Plan: EKG 12-Lead Low blood pressure on his last clinic visit and again today Taking all his blood pressure medications in the morning Recommended he consider moving losartan spironolactone to the evening If pressure continues to run low would hold one or both medications  Mixed hyperlipidemia - Plan: EKG 12-Lead Zetia simvastatin Numbers at goal  History of atrial septal defect repair - Plan: EKG 12-Lead  repair at age 25 . No indication of residual defect  Cardiomyopathy Medication changes as above  Obstructive sleep apnea on CPAP - Plan: EKG 12-Lead Compliant with his CPAP  Disposition:   F/U 12 months     Total encounter time more than 45 minutes  Greater than 50% was spent in counseling and coordination of care with the patient    Orders Placed This Encounter  Procedures  . EKG 12-Lead     Signed, Esmond Plants, M.D., Ph.D. 06/10/2018  [place], [place]

## 2018-06-10 ENCOUNTER — Encounter: Payer: Self-pay | Admitting: Cardiovascular Disease

## 2018-06-10 ENCOUNTER — Ambulatory Visit: Payer: Medicare HMO | Admitting: Cardiovascular Disease

## 2018-06-10 ENCOUNTER — Encounter

## 2018-06-10 VITALS — BP 90/60 | HR 58 | Ht 73.0 in | Wt 213.5 lb

## 2018-06-10 DIAGNOSIS — I25118 Atherosclerotic heart disease of native coronary artery with other forms of angina pectoris: Secondary | ICD-10-CM | POA: Diagnosis not present

## 2018-06-10 DIAGNOSIS — I255 Ischemic cardiomyopathy: Secondary | ICD-10-CM | POA: Diagnosis not present

## 2018-06-10 DIAGNOSIS — G4733 Obstructive sleep apnea (adult) (pediatric): Secondary | ICD-10-CM

## 2018-06-10 DIAGNOSIS — E782 Mixed hyperlipidemia: Secondary | ICD-10-CM | POA: Diagnosis not present

## 2018-06-10 DIAGNOSIS — Z9989 Dependence on other enabling machines and devices: Secondary | ICD-10-CM

## 2018-06-10 DIAGNOSIS — I471 Supraventricular tachycardia: Secondary | ICD-10-CM | POA: Diagnosis not present

## 2018-06-10 DIAGNOSIS — I10 Essential (primary) hypertension: Secondary | ICD-10-CM

## 2018-06-10 NOTE — Patient Instructions (Addendum)
Medication Instructions:   Step #1 Move the losartan and aldactone to the PM   If blood pressure continues to run low hold the spironolactone If it still runs low,  Hold the losartan  Labwork:  No new labs needed  Testing/Procedures:  No further testing at this time   Follow-Up: It was a pleasure seeing you in the office today. Please call us if you have new issues that need to be addressed before your next appt.  [phone]  Your physician wants you to follow-up in: 12 months.  You will receive a reminder letter in the mail two months in advance. If you don't receive a letter, please call our office to schedule the follow-up appointment.  If you need a refill on your cardiac medications before your next appointment, please call your pharmacy.  For educational health videos Log in to : [email or web address] Or : [email or web address], password : [email or web address]

## 2018-06-13 DIAGNOSIS — G4733 Obstructive sleep apnea (adult) (pediatric): Secondary | ICD-10-CM | POA: Diagnosis not present

## 2018-06-14 DIAGNOSIS — G4733 Obstructive sleep apnea (adult) (pediatric): Secondary | ICD-10-CM | POA: Diagnosis not present

## 2018-06-14 DIAGNOSIS — M545 Low back pain: Secondary | ICD-10-CM | POA: Diagnosis not present

## 2018-06-14 DIAGNOSIS — M9903 Segmental and somatic dysfunction of lumbar region: Secondary | ICD-10-CM | POA: Diagnosis not present

## 2018-06-14 DIAGNOSIS — M9904 Segmental and somatic dysfunction of sacral region: Secondary | ICD-10-CM | POA: Diagnosis not present

## 2018-06-14 DIAGNOSIS — M461 Sacroiliitis, not elsewhere classified: Secondary | ICD-10-CM | POA: Diagnosis not present

## 2018-06-20 DIAGNOSIS — L570 Actinic keratosis: Secondary | ICD-10-CM | POA: Diagnosis not present

## 2018-06-20 DIAGNOSIS — L812 Freckles: Secondary | ICD-10-CM | POA: Diagnosis not present

## 2018-06-20 DIAGNOSIS — L821 Other seborrheic keratosis: Secondary | ICD-10-CM | POA: Diagnosis not present

## 2018-06-20 DIAGNOSIS — L578 Other skin changes due to chronic exposure to nonionizing radiation: Secondary | ICD-10-CM | POA: Diagnosis not present

## 2018-06-20 DIAGNOSIS — D692 Other nonthrombocytopenic purpura: Secondary | ICD-10-CM | POA: Diagnosis not present

## 2018-06-29 ENCOUNTER — Other Ambulatory Visit: Payer: Self-pay | Admitting: *Deleted

## 2018-06-29 MED ORDER — LOSARTAN POTASSIUM 25 MG PO TABS: 25.0000 mg | ORAL_TABLET | Freq: Every day | ORAL | 2 refills | Status: DC

## 2018-06-29 MED ORDER — METOPROLOL SUCCINATE ER 100 MG PO TB24: 100.0000 mg | ORAL_TABLET | Freq: Every day | ORAL | 2 refills | Status: DC

## 2018-07-13 DIAGNOSIS — G4733 Obstructive sleep apnea (adult) (pediatric): Secondary | ICD-10-CM | POA: Diagnosis not present

## 2018-07-14 ENCOUNTER — Ambulatory Visit: Payer: Medicare HMO | Admitting: Cardiovascular Disease

## 2018-07-22 ENCOUNTER — Other Ambulatory Visit: Payer: Self-pay | Admitting: *Deleted

## 2018-07-22 ENCOUNTER — Other Ambulatory Visit: Payer: Self-pay

## 2018-07-22 MED ORDER — BUPROPION HCL ER (XL) 300 MG PO TB24: 300.0000 mg | ORAL_TABLET | Freq: Every day | ORAL | 1 refills | Status: DC

## 2018-07-22 NOTE — Telephone Encounter (Signed)
Patient called and requested that I submit rx for Wellbutrin to [facility] for it is double the cost at [facility] sent as requested. [facility] was currently at [facility] and will have them cancel RX

## 2018-07-22 NOTE — Telephone Encounter (Signed)
Patient Requested Refill. Faxed to pharmacy.

## 2018-07-25 ENCOUNTER — Other Ambulatory Visit: Payer: Self-pay | Admitting: *Deleted

## 2018-07-25 ENCOUNTER — Ambulatory Visit (INDEPENDENT_AMBULATORY_CARE_PROVIDER_SITE_OTHER): Payer: Medicare HMO | Admitting: *Deleted

## 2018-07-25 DIAGNOSIS — Z23 Encounter for immunization: Secondary | ICD-10-CM

## 2018-07-25 MED ORDER — ISOSORBIDE MONONITRATE ER 30 MG PO TB24: 15.0000 mg | ORAL_TABLET | Freq: Every day | ORAL | 11 refills | Status: DC

## 2018-08-01 ENCOUNTER — Encounter: Payer: Self-pay | Admitting: Podiatry

## 2018-08-01 ENCOUNTER — Ambulatory Visit: Payer: Medicare HMO | Admitting: Podiatry

## 2018-08-01 DIAGNOSIS — M79674 Pain in right toe(s): Secondary | ICD-10-CM | POA: Diagnosis not present

## 2018-08-01 DIAGNOSIS — B351 Tinea unguium: Secondary | ICD-10-CM

## 2018-08-01 DIAGNOSIS — L600 Ingrowing nail: Secondary | ICD-10-CM

## 2018-08-01 NOTE — Progress Notes (Signed)
This patient presents the office with chief complaint of long thick painful ingrowing toenail on the outside border big toe, right foot. He states that he has pain walking and wearing his shoes.  Patient was seen 3 months ago and we discussed the malformation of the nail.  We discussed possible surgery for  correctionng an incurvated mycotic toenail.  He presents the office today stating that the pain is severe and desires an evaluation and treatment of this painful condition.  General Appearance  Alert, conversant and in no acute stress.  Vascular  Dorsalis pedis and posterior tibial  pulses are weakly palpable  bilaterally.  Capillary return is   bilaterally. Temperature is within normal limits  bilaterally.  Neurologic  Senn-Weinstein monofilament wire test within normal limits  bilaterally. Muscle power within normal limits bilaterally.  Nails Thick disfigured discolored nails with subungual debris  from hallux to fifth toes bilaterally. No evidence of bacterial infection or drainage bilaterally. Marked incurvation noted along the lateral border of the right great toenail.  Severe pain noted upon palpation of the distal aspect of the lateral border right hallux.  Orthopedic  No limitations of motion  feet .  No crepitus or effusions noted.  No bony pathology or digital deformities noted.  Skin  normotropic skin with no porokeratosis noted bilaterally.  No signs of infections or ulcers noted.    Ingrown Toenail right hallux  Onychomycosis  Right hallux.  Incision and drainage distal aspect lateral border right hallux.  Discussed this condition with this patient.  I told him he would benefit from cauterization of the lateral border of the right hallux.  I am concerned with his circulatory status and recommended he have that checked by his medical doctor with vascular testing.  RTC prn.   Gardiner Barefoot DPM

## 2018-08-13 DIAGNOSIS — G4733 Obstructive sleep apnea (adult) (pediatric): Secondary | ICD-10-CM | POA: Diagnosis not present

## 2018-08-30 ENCOUNTER — Other Ambulatory Visit: Payer: Self-pay | Admitting: Cardiovascular Disease

## 2018-09-07 ENCOUNTER — Ambulatory Visit (INDEPENDENT_AMBULATORY_CARE_PROVIDER_SITE_OTHER): Payer: Medicare HMO

## 2018-09-07 ENCOUNTER — Other Ambulatory Visit: Payer: Self-pay | Admitting: Nurse Practitioner

## 2018-09-07 VITALS — BP 110/60 | HR 59 | Temp 97.3°F | Ht 73.0 in | Wt 216.0 lb

## 2018-09-07 DIAGNOSIS — Z0000 Encounter for general adult medical examination without abnormal findings: Secondary | ICD-10-CM | POA: Diagnosis not present

## 2018-09-07 DIAGNOSIS — Z720 Tobacco use: Secondary | ICD-10-CM

## 2018-09-07 NOTE — Progress Notes (Addendum)
Subjective:   Mitchell Stephens. is a 72 y.o. male who presents for Medicare Annual/Subsequent preventive examination.  Last AWV-08/12/2017    Objective:    Vitals: BP 110/60 (BP Location: Left Arm, Patient Position: Sitting)   Pulse (!) 59   Temp (!) 97.3 F (36.3 C) (Oral)   Ht 6\' 1"  (1.854 m)   Wt 216 lb (98 kg)   SpO2 96%   BMI 28.50 kg/m   Body mass index is 28.5 kg/m.  Advanced Directives 09/07/2018 05/10/2018 04/14/2018 12/13/2017 08/12/2017 07/29/2017 06/30/2017  Does Patient Have a Medical Advance Directive? Yes No Yes Yes Yes Yes Yes  Type of Paramedic of [place];Living will - Living will [place];Living will [place];Living will Healthcare Power of [place];Living will  Does patient want to make changes to medical advance directive? No - Patient declined - - No - Patient declined No - Patient declined No - Patient declined No - Patient declined  Copy of [place] in Chart? Yes - validated most recent copy scanned in chart (See row information) - - Yes Yes No - copy requested No - copy requested  Would patient like information on creating a medical advance directive? - No - Patient declined - - - - -    Tobacco Social History   Tobacco Use  Smoking Status Former Smoker  . Years: 40.00  . Last attempt to quit: 09/04/2015  . Years since quitting: 3.0  Smokeless Tobacco Never Used     Counseling given: Not Answered   Clinical Intake:  Pre-visit preparation completed: No  Pain : No/denies pain     Diabetes: No  How often do you need to have someone help you when you read instructions, pamphlets, or other written materials from your doctor or pharmacy?: 1 - Never What is the last grade level you completed in school?: Bachelor degree  Interpreter Needed?: No  Information entered by :: [name], RN  Past Medical History:  Diagnosis Date    . Coronary artery disease    a. 06/2017 MV: EF 45%, large inf and apical defect w/o ischemia;  b. 06/2017 Cath: LM nl, LAD 30ost, LCX 50p, OM1/2 ok, RCA 100p w/ L->R collats. EF 35%.  . Depression with anxiety   . Gastric ulcer, acute with hemorrhage 2001   Dr. Sharlet Salina, [facility]  . H/O congenital atrial septal defect (ASD) repair   . Helicobacter pylori gastritis   . Heme positive stool   . History of gout   . History of peptic ulcer disease    had bleeding ulcers requiring transfusion  . History of PSVT (paroxysmal supraventricular tachycardia)   . Hyperlipidemia   . Hypertension   . Ischemic cardiomyopathy    a. 06/2017 LV gram: EF 35%.  . Obstructive sleep apnea    on CPAP  . Perennial allergic rhinitis   . Tobacco abuse    Past Surgical History:  Procedure Laterality Date  . ASD REPAIR, OSTIUM PRIMUM  1966  . 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 LEFT HEART CATH AND CORONARY ANGIOGRAPHY Left 06/30/2017   Procedure: LEFT HEART CATH AND CORONARY ANGIOGRAPHY;  Surgeon: Minna Merritts, MD;  Location: [facility];  Service: Cardiovascular;  Laterality: Left;  . open heart surgery  1966   Dr. Annamaria Boots ([facility]); was PFO repair--has been normal in f/u  . PALATE / UVULA BIOPSY / EXCISION  2000   Dr.Sprenhe [facility])  . TONSILLECTOMY AND ADENOIDECTOMY  1959   Family History  Problem Relation Age of Onset  . Failure to thrive Mother   . Arthritis Mother    Social History   Socioeconomic History  . Marital status: Married    Spouse name: Not on file  . Number of children: Not on file  . Years of education: Not on file  . Highest education level: Not on file  Occupational History  . Not on file  Social Needs  . Financial resource strain: Not hard at all  . Food insecurity:    Worry: Never true    Inability: Never true  . Transportation needs:    Medical: No    Non-medical: No  Tobacco Use  . Smoking status: Former Smoker    Years: 40.00    Last  attempt to quit: 09/04/2015    Years since quitting: 3.0  . Smokeless tobacco: Never Used  Substance and Sexual Activity  . Alcohol use: No    Alcohol/week: 0.0 standard drinks    Comment: previously did drink alcohol heavily  . Drug use: No    Comment: likes to avoid pain meds, no illicit drugs used  . Sexual activity: Yes  Lifestyle  . Physical activity:    Days per week: 5 days    Minutes per session: 30 min  . Stress: Only a little  Relationships  . Social connections:    Talks on phone: Three times a week    Gets together: Once a week    Attends religious service: More than 4 times per year    Active member of club or organization: No    Attends meetings of clubs or organizations: Never    Relationship status: Married  Other Topics Concern  . Not on file  Social History Narrative   Drinks caffeine   Married in 1973   Lives in house   Two story home, 2 live in home   [place] and does yardwork   Has living will and HCPOA    Outpatient Encounter Medications as of 09/07/2018  Medication Sig  . aspirin EC 81 MG tablet Take 81 mg by mouth daily.  Marland Kitchen buPROPion (WELLBUTRIN XL) 300 MG 24 hr tablet Take 1 tablet (300 mg total) by mouth daily.  Marland Kitchen ezetimibe (ZETIA) 10 MG tablet Take 10 mg by mouth daily.  . isosorbide mononitrate (IMDUR) 30 MG 24 hr tablet Take 0.5 tablets (15 mg total) by mouth daily.  Marland Kitchen losartan (COZAAR) 25 MG tablet Take 1 tablet (25 mg total) by mouth daily.  . metoprolol succinate (TOPROL-XL) 100 MG 24 hr tablet Take 1 tablet (100 mg total) by mouth daily.  . naphazoline-glycerin (CLEAR EYES) 0.012-0.2 % SOLN Place 1-2 drops into both eyes 4 (four) times daily as needed (for redness).  . nitroGLYCERIN (NITROSTAT) 0.4 MG SL tablet Place 0.4 mg under the tongue every 5 (five) minutes as needed for chest pain.  Marland Kitchen propranolol (INDERAL) 20 MG tablet Take 1 tablet (20 mg total) by mouth as needed (As needed for fast heart rates).  . senna (SENOKOT)  8.6 MG tablet Take 2 tablets by mouth at bedtime.  . simvastatin (ZOCOR) 40 MG tablet TAKE 1 TABLET BY MOUTH EVERY DAY  . spironolactone (ALDACTONE) 25 MG tablet TAKE 1 TABLET BY MOUTH EVERY DAY  . traZODone (DESYREL) 50 MG tablet Take one tablet by mouth at bedtime as needed for insomnia   No  facility-administered encounter medications on file as of 09/07/2018.     Activities of Daily Living In your present state of health, do you have any difficulty performing the following activities: 09/07/2018  Hearing? N  Vision? N  Difficulty concentrating or making decisions? N  Walking or climbing stairs? N  Dressing or bathing? N  Doing errands, shopping? N  Preparing Food and eating ? N  Using the Toilet? N  In the past six months, have you accidently leaked urine? N  Do you have problems with loss of bowel control? N  Managing your Medications? N  Managing your Finances? N  Housekeeping or managing your Housekeeping? N  Some recent data might be hidden    Patient Care Team: Gayland Curry, DO as PCP - General (Geriatric Medicine) [place], Peter M, MD as PCP - Cardiology (Cardiology) [name], [place] as Referring Physician (Dermatology) [name], MD as Referring Physician (Otolaryngology)   Assessment:   This is a routine wellness examination for Jerris.  Exercise Activities and Dietary recommendations Current Exercise Habits: Home exercise routine, Type of exercise: walking, Time (Minutes): 30, Frequency (Times/Week): 5, Weekly Exercise (Minutes/Week): 150, Intensity: Mild, Exercise limited by: None identified  Goals      Patient Stated   . Increase physical activity (pt-stated)     Patient will walk a couple miles a day once cardiac rehab is done.       Fall Risk Fall Risk  09/07/2018 04/14/2018 12/13/2017 08/12/2017 07/29/2017  Falls in the past year? 1 No No No No  Number falls in past yr: 0 - - - -  Injury with Fall? 1 - - - -  Comment L hand - - - -  Risk for  fall due to : History of fall(s) - - - -  Follow up Education provided - - - -   Is the patient's home free of loose throw rugs in walkways, pet beds, electrical cords, etc?   yes      Grab bars in the bathroom? no      Handrails on the stairs?   yes      Adequate lighting?   yes  Timed Get Up and Go Performed: 13 seconds  Depression Screen PHQ 2/9 Scores 09/07/2018 12/13/2017 08/12/2017 07/29/2017  PHQ - 2 Score 0 0 1 3  PHQ- 9 Score - - - 7    Cognitive Function MMSE - Mini Mental State Exam 09/07/2018 08/12/2017 08/07/2016 05/09/2015  Orientation to time 5 4 5 4   Orientation to Place 5 5 5 5   Registration 3 3 3 3   Attention/ Calculation 5 5 5 5   Recall 3 3 3 1   Language- name 2 objects 2 2 2 2   Language- repeat 1 1 1 1   Language- follow 3 step command 3 3 3 3   Language- read & follow direction 1 1 1 1   Write a sentence 1 1 1 1   Copy design 1 1 1 1   Total score 30 29 30 27         Immunization History  Administered Date(s) Administered  . Influenza, High Dose Seasonal PF 06/25/2017, 07/25/2018  . Influenza,inj,Quad PF,6+ Mos 08/27/2015, 07/10/2016  . Pneumococcal Conjugate-13 10/05/2013  . Pneumococcal Polysaccharide-23 08/07/2016  . Zoster Recombinat (Shingrix) 07/06/2017, 12/08/2017    Qualifies for Shingles Vaccine? Up to date, completed  Screening Tests Health Maintenance  Topic Date Due  . TETANUS/TDAP  07/31/1964  . Hepatitis C Screening  04/15/2019 (Originally 1946-06-27)  . COLONOSCOPY  03/22/2019  .  INFLUENZA VACCINE  Completed  . PNA vac Low Risk Adult  Completed   Cancer Screenings: Lung: Low Dose CT Chest recommended if Age 73-80 years, 30 pack-year currently smoking OR have quit w/in 15years. Patient does qualify. Colorectal: up to date  Additional Screenings: Hepatitis C Screening:declined  TDAP due: declined    Plan:    I have personally reviewed and addressed the Medicare Annual Wellness questionnaire and have noted the following in the  patient's chart:  A. Medical and social history B. Use of alcohol, tobacco or illicit drugs  C. Current medications and supplements D. Functional ability and status E.  Nutritional status F.  Physical activity G. Advance directives H. List of other physicians I.  Hospitalizations, surgeries, and ER visits in previous 12 months J.  [place] to include hearing, vision, cognitive, depression L. Referrals and appointments - none  In addition, I have reviewed and discussed with patient certain preventive protocols, quality metrics, and best practice recommendations. A written personalized care plan for preventive services as well as general preventive health recommendations were provided to patient.  See attached scanned questionnaire for additional information.   Signed,   [name], RN Nurse Health Advisor  Patient concerns: daughter requested he get a low dose MRI for chest due to history of smoking

## 2018-09-07 NOTE — Patient Instructions (Signed)
[name] , Thank you for taking time to come for your Medicare Wellness Visit. I appreciate your ongoing commitment to your health goals. Please review the following plan we discussed and let me know if I can assist you in the future.   Screening recommendations/referrals: Colonoscopy up to date, due 03/21/2024 Recommended yearly ophthalmology/optometry visit for glaucoma screening and checkup Recommended yearly dental visit for hygiene and checkup  Vaccinations: Influenza vaccine up to date Pneumococcal vaccine up to date, completed Tdap vaccine due, declined Shingles vaccine up to date, completed    Advanced directives: in chart  Conditions/risks identified: none  Next appointment: Dr. Mariea Clonts 09/22/2018 @ 8:30am            [name], RN 09/11/2019 @ 10am  Preventive Care 65 Years and Older, Male Preventive care refers to lifestyle choices and visits with your health care provider that can promote health and wellness. What does preventive care include?  A yearly physical exam. This is also called an annual well check.  Dental exams once or twice a year.  Routine eye exams. Ask your health care provider how often you should have your eyes checked.  Personal lifestyle choices, including:  Daily care of your teeth and gums.  Regular physical activity.  Eating a healthy diet.  Avoiding tobacco and drug use.  Limiting alcohol use.  Practicing safe sex.  Taking low doses of aspirin every day.  Taking vitamin and mineral supplements as recommended by your health care provider. What happens during an annual well check? The services and screenings done by your health care provider during your annual well check will depend on your age, overall health, lifestyle risk factors, and family history of disease. Counseling  Your health care provider may ask you questions about your:  Alcohol use.  Tobacco use.  Drug use.  Emotional well-being.  Home and  relationship well-being.  Sexual activity.  Eating habits.  History of falls.  Memory and ability to understand (cognition).  Work and work Statistician. Screening  You may have the following tests or measurements:  Height, weight, and BMI.  Blood pressure.  Lipid and cholesterol levels. These may be checked every 5 years, or more frequently if you are over 27 years old.  Skin check.  Lung cancer screening. You may have this screening every year starting at age 44 if you have a 30-pack-year history of smoking and currently smoke or have quit within the past 15 years.  Fecal occult blood test (FOBT) of the stool. You may have this test every year starting at age 73.  Flexible sigmoidoscopy or colonoscopy. You may have a sigmoidoscopy every 5 years or a colonoscopy every 10 years starting at age 42.  Prostate cancer screening. Recommendations will vary depending on your family history and other risks.  Hepatitis C blood test.  Hepatitis B blood test.  Sexually transmitted disease (STD) testing.  Diabetes screening. This is done by checking your blood sugar (glucose) after you have not eaten for a while (fasting). You may have this done every 1-3 years.  Abdominal aortic aneurysm (AAA) screening. You may need this if you are a current or former smoker.  Osteoporosis. You may be screened starting at age 54 if you are at high risk. Talk with your health care provider about your test results, treatment options, and if necessary, the need for more tests. Vaccines  Your health care provider may recommend certain vaccines, such as:  Influenza vaccine. This is recommended every year.  Tetanus, diphtheria, and acellular pertussis (Tdap, Td) vaccine. You may need a Td booster every 10 years.  Zoster vaccine. You may need this after age 22.  Pneumococcal 13-valent conjugate (PCV13) vaccine. One dose is recommended after age 4.  Pneumococcal polysaccharide (PPSV23) vaccine. One  dose is recommended after age 45. Talk to your health care provider about which screenings and vaccines you need and how often you need them. This information is not intended to replace advice given to you by your health care provider. Make sure you discuss any questions you have with your health care provider. Document Released: 10/18/2015 Document Revised: 06/10/2016 Document Reviewed: 07/23/2015 Elsevier Interactive Patient Education  2017 [place] Prevention in the Home Falls can cause injuries. They can happen to people of all ages. There are many things you can do to make your home safe and to help prevent falls. What can I do on the outside of my home?  Regularly fix the edges of walkways and driveways and fix any cracks.  Remove anything that might make you trip as you walk through a door, such as a raised step or threshold.  Trim any bushes or trees on the path to your home.  Use bright outdoor lighting.  Clear any walking paths of anything that might make someone trip, such as rocks or tools.  Regularly check to see if handrails are loose or broken. Make sure that both sides of any steps have handrails.  Any raised decks and porches should have guardrails on the edges.  Have any leaves, snow, or ice cleared regularly.  Use sand or salt on walking paths during winter.  Clean up any spills in your garage right away. This includes oil or grease spills. What can I do in the bathroom?  Use night lights.  Install grab bars by the toilet and in the tub and shower. Do not use towel bars as grab bars.  Use non-skid mats or decals in the tub or shower.  If you need to sit down in the shower, use a plastic, non-slip stool.  Keep the floor dry. Clean up any water that spills on the floor as soon as it happens.  Remove soap buildup in the tub or shower regularly.  Attach bath mats securely with double-sided non-slip rug tape.  Do not have throw rugs and other  things on the floor that can make you trip. What can I do in the bedroom?  Use night lights.  Make sure that you have a light by your bed that is easy to reach.  Do not use any sheets or blankets that are too big for your bed. They should not hang down onto the floor.  Have a firm chair that has side arms. You can use this for support while you get dressed.  Do not have throw rugs and other things on the floor that can make you trip. What can I do in the kitchen?  Clean up any spills right away.  Avoid walking on wet floors.  Keep items that you use a lot in easy-to-reach places.  If you need to reach something above you, use a strong step stool that has a grab bar.  Keep electrical cords out of the way.  Do not use floor polish or wax that makes floors slippery. If you must use wax, use non-skid floor wax.  Do not have throw rugs and other things on the floor that can make you trip. What can I do  with my stairs?  Do not leave any items on the stairs.  Make sure that there are handrails on both sides of the stairs and use them. Fix handrails that are broken or loose. Make sure that handrails are as long as the stairways.  Check any carpeting to make sure that it is firmly attached to the stairs. Fix any carpet that is loose or worn.  Avoid having throw rugs at the top or bottom of the stairs. If you do have throw rugs, attach them to the floor with carpet tape.  Make sure that you have a light switch at the top of the stairs and the bottom of the stairs. If you do not have them, ask someone to add them for you. What else can I do to help prevent falls?  Wear shoes that:  Do not have high heels.  Have rubber bottoms.  Are comfortable and fit you well.  Are closed at the toe. Do not wear sandals.  If you use a stepladder:  Make sure that it is fully opened. Do not climb a closed stepladder.  Make sure that both sides of the stepladder are locked into place.  Ask  someone to hold it for you, if possible.  Clearly mark and make sure that you can see:  Any grab bars or handrails.  First and last steps.  Where the edge of each step is.  Use tools that help you move around (mobility aids) if they are needed. These include:  Canes.  Walkers.  Scooters.  Crutches.  Turn on the lights when you go into a dark area. Replace any light bulbs as soon as they burn out.  Set up your furniture so you have a clear path. Avoid moving your furniture around.  If any of your floors are uneven, fix them.  If there are any pets around you, be aware of where they are.  Review your medicines with your doctor. Some medicines can make you feel dizzy. This can increase your chance of falling. Ask your doctor what other things that you can do to help prevent falls. This information is not intended to replace advice given to you by your health care provider. Make sure you discuss any questions you have with your health care provider. Document Released: 07/18/2009 Document Revised: 02/27/2016 Document Reviewed: 10/26/2014 Elsevier Interactive Patient Education  2017 [employer].

## 2018-09-12 DIAGNOSIS — G4733 Obstructive sleep apnea (adult) (pediatric): Secondary | ICD-10-CM | POA: Diagnosis not present

## 2018-09-22 ENCOUNTER — Encounter: Payer: Self-pay | Admitting: Internal Medicine

## 2018-09-22 ENCOUNTER — Ambulatory Visit (INDEPENDENT_AMBULATORY_CARE_PROVIDER_SITE_OTHER): Payer: Medicare HMO | Admitting: Internal Medicine

## 2018-09-22 VITALS — BP 112/68 | HR 61 | Temp 97.9°F | Ht 73.0 in | Wt 217.0 lb

## 2018-09-22 DIAGNOSIS — Z9989 Dependence on other enabling machines and devices: Secondary | ICD-10-CM

## 2018-09-22 DIAGNOSIS — Z122 Encounter for screening for malignant neoplasm of respiratory organs: Secondary | ICD-10-CM | POA: Diagnosis not present

## 2018-09-22 DIAGNOSIS — G4733 Obstructive sleep apnea (adult) (pediatric): Secondary | ICD-10-CM

## 2018-09-22 DIAGNOSIS — J329 Chronic sinusitis, unspecified: Secondary | ICD-10-CM

## 2018-09-22 DIAGNOSIS — I10 Essential (primary) hypertension: Secondary | ICD-10-CM

## 2018-09-22 DIAGNOSIS — I429 Cardiomyopathy, unspecified: Secondary | ICD-10-CM

## 2018-09-22 DIAGNOSIS — R39198 Other difficulties with micturition: Secondary | ICD-10-CM | POA: Diagnosis not present

## 2018-09-22 DIAGNOSIS — E663 Overweight: Secondary | ICD-10-CM

## 2018-09-22 DIAGNOSIS — B9689 Other specified bacterial agents as the cause of diseases classified elsewhere: Secondary | ICD-10-CM

## 2018-09-22 DIAGNOSIS — Z0000 Encounter for general adult medical examination without abnormal findings: Secondary | ICD-10-CM

## 2018-09-22 DIAGNOSIS — R351 Nocturia: Secondary | ICD-10-CM | POA: Diagnosis not present

## 2018-09-22 DIAGNOSIS — R739 Hyperglycemia, unspecified: Secondary | ICD-10-CM

## 2018-09-22 DIAGNOSIS — E782 Mixed hyperlipidemia: Secondary | ICD-10-CM | POA: Diagnosis not present

## 2018-09-22 DIAGNOSIS — Z23 Encounter for immunization: Secondary | ICD-10-CM

## 2018-09-22 DIAGNOSIS — I25110 Atherosclerotic heart disease of native coronary artery with unstable angina pectoris: Secondary | ICD-10-CM | POA: Diagnosis not present

## 2018-09-22 LAB — PSA: PSA: 2.1 ng/mL (ref <=4.0)

## 2018-09-22 MED ORDER — AMOXICILLIN-POT CLAVULANATE 875-125 MG PO TABS: 1.0000 | ORAL_TABLET | Freq: Two times a day (BID) | ORAL | 0 refills | Status: DC

## 2018-09-22 MED ORDER — TETANUS-DIPHTH-ACELL PERTUSSIS 5-2-15.5 LF-MCG/0.5 IM SUSP: 0.5000 mL | Freq: Once | INTRAMUSCULAR | 0 refills | Status: AC

## 2018-09-22 NOTE — Progress Notes (Signed)
Provider:  Rexene Edison. Mariea Clonts, D.O., C.M.D. Location:   [facility]  Place of Service:   clinic  Previous PCP: Gayland Curry, DO Patient Care Team: Gayland Curry, DO as PCP - General (Geriatric Medicine) Martinique, Peter M, MD as PCP - Cardiology (Cardiology) [name], [place] as Referring Physician (Dermatology) [name], MD as Referring Physician (Otolaryngology)  Extended Emergency Contact Information Primary Emergency Contact: [facility] Address: [address]          [place], [place] [address] [name] of [place] Phone: [phone] Mobile Phone: [phone] Relation: Spouse  Goals of Care: Advanced Directive information Advanced Directives 09/07/2018  Does Patient Have a Medical Advance Directive? Yes  Type of Paramedic of [place];Living will  Does patient want to make changes to medical advance directive? No - Patient declined  Copy of [place] in Chart? Yes - validated most recent copy scanned in chart (See row information)  Would patient like information on creating a medical advance directive? -      Chief Complaint  Patient presents with  . Medical Management of Chronic Issues    61mth follow-up    HPI: Patient is a 72 y.o. male seen today for an annual physical exam.  He recognizes a change in the last year.  Realizes his walking will not improve.   If he pushes it walking, he has angina.   3-4 weeks ago, he was walking.  He had a week where it hurt the whole walk, he took ntg after the walk.  Happened 3 times.  Whole body ached.  He's not sure if was temp (cool) or humidity.  In [place], he walked 30 miles while there w/o problems.  Says his days of getting in shape by walking more are gone b/c he doesn't seem to get his heart more efficient.  It concerns him, but he's learning to live with it.  He has used 6 ntg per month.  He was released for a year from cardiologist.  Sees him again in  September.    Feels like the mental aspect is the same if not greater than the physical.    He's losing friends left and right.  Three have passed away in the past week.  He told his daughter about the walking better in [place] due to dry air.    Has a small sinus infection.  Coughs up some yellow mucus.  Sometimes green.  Has had some sinus pressure.  Using nasal spray.  No known fever.    He is still going to his meetings 4-5 times per week.  Goes b/c he wants to go.  Goes to keep from having a problem.    He has two friends with lung cancer.  He quit smoking 3 years ago  marlboros a pack a day for 30-40 years.    Got new CPAP several months ago and uses it faithfully each night.  Just had full body skin scan and gets things burned off.  Past Medical History:  Diagnosis Date  . Coronary artery disease    a. 06/2017 MV: EF 45%, large inf and apical defect w/o ischemia;  b. 06/2017 Cath: LM nl, LAD 30ost, LCX 50p, OM1/2 ok, RCA 100p w/ L->R collats. EF 35%.  . Depression with anxiety   . Gastric ulcer, acute with hemorrhage 2001   Dr. Sharlet Salina, [facility]  . H/O congenital atrial septal defect (ASD) repair   . Helicobacter pylori gastritis   .  Heme positive stool   . History of gout   . History of peptic ulcer disease    had bleeding ulcers requiring transfusion  . History of PSVT (paroxysmal supraventricular tachycardia)   . Hyperlipidemia   . Hypertension   . Ischemic cardiomyopathy    a. 06/2017 LV gram: EF 35%.  . Obstructive sleep apnea    on CPAP  . Perennial allergic rhinitis   . Tobacco abuse    Past Surgical History:  Procedure Laterality Date  . ASD REPAIR, OSTIUM PRIMUM  1966  . 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 LEFT HEART CATH AND CORONARY ANGIOGRAPHY Left 06/30/2017   Procedure: LEFT HEART CATH AND CORONARY ANGIOGRAPHY;  Surgeon: Minna Merritts, MD;  Location: [facility];  Service: Cardiovascular;  Laterality: Left;  . open heart  surgery  1966   Dr. Annamaria Boots ([facility]); was PFO repair--has been normal in f/u  . PALATE / UVULA BIOPSY / EXCISION  2000   Dr.Sprenhe [facility])  . TONSILLECTOMY AND ADENOIDECTOMY  1959    reports that he quit smoking about 3 years ago. He quit after 40.00 years of use. He has never used smokeless tobacco. He reports that he does not drink alcohol or use drugs.  Functional Status Survey:    Family History  Problem Relation Age of Onset  . Failure to thrive Mother   . Arthritis Mother     Health Maintenance  Topic Date Due  . Samul Dada  07/[birth year]/1964  . Hepatitis C Screening  04/15/2019 (Originally 1945/12/29)  . COLONOSCOPY  03/22/2019  . INFLUENZA VACCINE  Completed  . PNA vac Low Risk Adult  Completed    Allergies  Allergen Reactions  . Pollen Extract Other (See Comments)    Stuffy nose/itchy throat (Seasonal allergies)    Outpatient Encounter Medications as of 09/22/2018  Medication Sig  . aspirin EC 81 MG tablet Take 81 mg by mouth daily.  Marland Kitchen buPROPion (WELLBUTRIN XL) 300 MG 24 hr tablet Take 1 tablet (300 mg total) by mouth daily.  Marland Kitchen ezetimibe (ZETIA) 10 MG tablet Take 10 mg by mouth daily.  . isosorbide mononitrate (IMDUR) 30 MG 24 hr tablet Take 0.5 tablets (15 mg total) by mouth daily.  Marland Kitchen losartan (COZAAR) 25 MG tablet Take 1 tablet (25 mg total) by mouth daily.  . metoprolol succinate (TOPROL-XL) 100 MG 24 hr tablet Take 1 tablet (100 mg total) by mouth daily.  . naphazoline-glycerin (CLEAR EYES) 0.012-0.2 % SOLN Place 1-2 drops into both eyes 4 (four) times daily as needed (for redness).  . nitroGLYCERIN (NITROSTAT) 0.4 MG SL tablet Place 0.4 mg under the tongue every 5 (five) minutes as needed for chest pain.  Marland Kitchen propranolol (INDERAL) 20 MG tablet Take 1 tablet (20 mg total) by mouth as needed (As needed for fast heart rates).  . senna (SENOKOT) 8.6 MG tablet Take 2 tablets by mouth at bedtime.  . simvastatin (ZOCOR) 40 MG tablet TAKE 1 TABLET BY MOUTH EVERY DAY  .  spironolactone (ALDACTONE) 25 MG tablet TAKE 1 TABLET BY MOUTH EVERY DAY  . traZODone (DESYREL) 50 MG tablet Take one tablet by mouth at bedtime as needed for insomnia   No facility-administered encounter medications on file as of 09/22/2018.     Review of Systems  Constitutional: Negative for chills, fever and malaise/fatigue.  HENT: Positive for congestion. Negative for hearing loss.   Eyes: Negative for blurred vision.  Respiratory: Negative for cough and shortness  of breath.   Cardiovascular: Positive for chest pain. Negative for palpitations and leg swelling.  Gastrointestinal: Positive for constipation. Negative for abdominal pain, blood in stool, diarrhea and melena.  Genitourinary: Negative for dysuria.  Musculoskeletal: Negative for falls and joint pain.  Skin: Negative for itching and rash.  Neurological: Positive for headaches. Negative for tremors and weakness.  Endo/Heme/Allergies: Does not bruise/bleed easily.  Psychiatric/Behavioral: Negative for depression and memory loss. The patient is not nervous/anxious and does not have insomnia.     Vitals:   09/22/18 0840  BP: 112/68  Pulse: 61  Temp: 97.9 F (36.6 C)  TempSrc: Oral  SpO2: 97%  Weight: 217 lb (98.4 kg)  Height: 6\' 1"  (1.854 m)   Body mass index is 28.63 kg/m. Physical Exam Vitals signs reviewed.  Constitutional:      General: He is not in acute distress.    Appearance: Normal appearance. He is not toxic-appearing.  HENT:     Head: Normocephalic and atraumatic.     Right Ear: Tympanic membrane, ear canal and external ear normal.     Left Ear: Tympanic membrane, ear canal and external ear normal.     Nose: Nose normal. No congestion or rhinorrhea.     Mouth/Throat:     Mouth: Mucous membranes are moist.     Pharynx: Oropharynx is clear.  Eyes:     Extraocular Movements: Extraocular movements intact.     Conjunctiva/sclera: Conjunctivae normal.     Pupils: Pupils are equal, round, and reactive to  light.  Neck:     Musculoskeletal: Neck supple.  Cardiovascular:     Rate and Rhythm: Normal rate and regular rhythm.     Heart sounds: Normal heart sounds.  Pulmonary:     Effort: Pulmonary effort is normal.     Breath sounds: Normal breath sounds.  Abdominal:     General: Bowel sounds are normal. There is no distension.     Palpations: Abdomen is soft. There is no mass.     Tenderness: There is no abdominal tenderness.  Musculoskeletal: Normal range of motion.        General: No swelling, tenderness, deformity or signs of injury.     Right lower leg: No edema.     Left lower leg: No edema.  Skin:    General: Skin is warm and dry.     Capillary Refill: Capillary refill takes less than 2 seconds.  Neurological:     General: No focal deficit present.     Mental Status: He is alert and oriented to person, place, and time.     Cranial Nerves: No cranial nerve deficit.     Sensory: No sensory deficit.     Motor: No weakness.     Coordination: Coordination normal.     Gait: Gait normal.     Deep Tendon Reflexes: Reflexes normal.  Psychiatric:        Mood and Affect: Mood normal.        Behavior: Behavior normal.        Thought Content: Thought content normal.        Judgment: Judgment normal.     Labs reviewed: Basic Metabolic Panel: Recent Labs    12/09/17 0907 04/04/18 0843 05/10/18 1428  NA 138 138 136  K 4.2 4.3 4.5  CL 101 105 104  CO2 29 27 28   GLUCOSE 107* 106* 113*  BUN 21 17 23   CREATININE 1.41* 1.22* 1.36*  CALCIUM 9.5 9.2 9.0   Liver  Function Tests: Recent Labs    12/09/17 0907 05/10/18 1428  AST 13 21  ALT 11 18  ALKPHOS  --  41  BILITOT 0.8 0.7  PROT 6.1 6.7  ALBUMIN  --  4.0   Recent Labs    05/10/18 1428  LIPASE 30   No results for input(s): AMMONIA in the last 8760 hours. CBC: Recent Labs    12/09/17 0907 04/04/18 0843 05/10/18 1428  WBC 9.8 7.7 8.9  NEUTROABS 7,615 5,167 6.0  HGB 13.5 13.1* 13.7  HCT 39.7 38.7 40.0  MCV 91.1  90.2 92.8  PLT 238 228 227   Cardiac Enzymes: Recent Labs    05/10/18 1428 05/10/18 1715  TROPONINI <0.03 <0.03   BNP: Invalid input(s): POCBNP Lab Results  Component Value Date   HGBA1C 5.8 (H) 04/04/2018    Assessment/Plan 1. Overweight (BMI 25.0-29.9) - plans to improver diet -f/u labs before next visit b/c he knows he's been terrible about his eating the past week - Lipid panel; Future - Hemoglobin A1c; Future  2. Encounter for screening for malignant neoplasm of respiratory organs -pt asked about screening CT--turned out Clarise [facility] had asked NP to order it already so it was already ordered - CT CHEST LUNG CA SCREEN LOW DOSE W/O CM; Future  3. Cardiomyopathy, unspecified type ([facility]) -notable, follows with cardiology, cont same regimen  4. Coronary artery disease involving native coronary artery of native heart with unstable angina pectoris ([place]) -continues with angina when exerts self - COMPLETE METABOLIC PANEL WITH GFR; Future  5. Essential hypertension, benign -bp at goal with current regimen, cont same and decrease sodium in diet - COMPLETE METABOLIC PANEL WITH GFR; Future  6. Mixed hyperlipidemia - cont to work on diet and keep walking - CBC with Differential/Platelet; Future - COMPLETE METABOLIC PANEL WITH GFR; Future - Lipid panel; Future  7. Hyperglycemia -has been fairly good, but recently ate poorly so will f/u labs next time after he gets back on the wagon - Hemoglobin A1c; Future  8. Obstructive sleep apnea on CPAP -cont CPAP nightly  9. Sinusitis, bacterial - ongoing for about 2 mos with yellow-green mucus so will treat with abx -also [place] so don't want him stranded there untreated - amoxicillin-clavulanate (AUGMENTIN) 875-125 MG tablet; Take 1 tablet by mouth 2 (two) times daily.  Dispense: 20 tablet; Refill: 0  10. Slow urinary stream - and nocturia if drinks fluids in evening - PSA at his request -may need urology  visit  11. Need for Tdap vaccination - Tdap (ADACEL) 02-03-14.5 LF-MCG/0.5 injection; Inject 0.5 mLs into the muscle once for 1 dose.  Dispense: 0.5 mL; Refill: 0  12. Annual physical exam -performed today  Labs/tests ordered:   Orders Placed This Encounter  Procedures  . CT CHEST LUNG CA SCREEN LOW DOSE W/O CM    Standing Status:   Future    Standing Expiration Date:   11/24/2019    Scheduling Instructions:     Either before Jan 1st or after Feb 29    Order Specific Question:   ** REASON FOR EXAM (FREE TEXT)    Answer:   screening CT for lung cancer    Order Specific Question:   Reason for Exam (SYMPTOM  OR DIAGNOSIS REQUIRED)    Answer:   30 pack year smoker quit 3 years ago    Order Specific Question:   Preferred Imaging Location?    Answer:   [facility]  Order Specific Question:   Radiology Contrast Protocol - do NOT remove file path    Answer:   [email or web address]  . PSA  . CBC with Differential/Platelet    Standing Status:   Future    Standing Expiration Date:   09/23/2019  . COMPLETE METABOLIC PANEL WITH GFR    Standing Status:   Future    Standing Expiration Date:   09/23/2019  . Lipid panel    Standing Status:   Future    Standing Expiration Date:   09/23/2019    Order Specific Question:   Has the patient fasted?    Answer:   Yes  . Hemoglobin A1c    Standing Status:   Future    Standing Expiration Date:   09/23/2019    Dasan Hardman L. Laikyn Gewirtz, D.O. [facility] [address], [place] [address] Cell Phone (Mon-Fri 8am-5pm):  [phone] On Call:  [phone] & follow prompts after 5pm & weekends Office Phone:  [phone] Office Fax:  [phone]

## 2018-09-30 ENCOUNTER — Telehealth: Payer: Self-pay | Admitting: Cardiovascular Disease

## 2018-09-30 ENCOUNTER — Other Ambulatory Visit: Payer: Self-pay | Admitting: *Deleted

## 2018-09-30 ENCOUNTER — Other Ambulatory Visit: Payer: Self-pay | Admitting: Internal Medicine

## 2018-09-30 MED ORDER — EZETIMIBE 10 MG PO TABS: 10.0000 mg | ORAL_TABLET | Freq: Every day | ORAL | 1 refills | Status: DC

## 2018-09-30 MED ORDER — NITROGLYCERIN 0.4 MG SL SUBL: 0.4000 mg | SUBLINGUAL_TABLET | SUBLINGUAL | 1 refills | Status: DC | PRN

## 2018-09-30 MED ORDER — TRAZODONE HCL 50 MG PO TABS: 50.0000 mg | ORAL_TABLET | Freq: Every evening | ORAL | 1 refills | Status: DC | PRN

## 2018-09-30 NOTE — Telephone Encounter (Signed)
Requested Prescriptions   Signed Prescriptions Disp Refills  . nitroGLYCERIN (NITROSTAT) 0.4 MG SL tablet 25 tablet 1    Sig: Place 1 tablet (0.4 mg total) under the tongue every 5 (five) minutes as needed for chest pain.    Authorizing Provider: GOLLAN, TIMOTHY J    Ordering User: LOPEZ, MARINA C    

## 2018-09-30 NOTE — Telephone Encounter (Signed)
°*  STAT* If patient is at the pharmacy, call can be transferred to refill team.   1. Which medications need to be refilled? (please list name of each medication and dose if known) nitroglycerin (NITROSTAT) 0.4 MG as needed  2. Which pharmacy/location (including street and city if local pharmacy) is medication to be sent to? [facility] [address]   3. Do they need a 30 day or 90 day supply? Just the little bottle

## 2018-09-30 NOTE — Telephone Encounter (Signed)
Patient requested refills. Faxed.

## 2018-10-13 DIAGNOSIS — G4733 Obstructive sleep apnea (adult) (pediatric): Secondary | ICD-10-CM | POA: Diagnosis not present

## 2018-10-20 ENCOUNTER — Encounter: Payer: Medicare HMO | Admitting: Internal Medicine

## 2018-10-25 ENCOUNTER — Other Ambulatory Visit: Payer: Self-pay | Admitting: Cardiovascular Disease

## 2018-11-13 DIAGNOSIS — G4733 Obstructive sleep apnea (adult) (pediatric): Secondary | ICD-10-CM | POA: Diagnosis not present

## 2018-11-30 ENCOUNTER — Other Ambulatory Visit: Payer: Self-pay | Admitting: *Deleted

## 2018-11-30 MED ORDER — METOPROLOL SUCCINATE ER 100 MG PO TB24: 100.0000 mg | ORAL_TABLET | Freq: Every day | ORAL | 0 refills | Status: DC

## 2018-11-30 MED ORDER — SPIRONOLACTONE 25 MG PO TABS: 25.0000 mg | ORAL_TABLET | Freq: Every day | ORAL | 0 refills | Status: DC

## 2018-11-30 NOTE — Telephone Encounter (Signed)
Patient requested refill Stated that he is in the [facility] and needs [facility]. Faxed.

## 2018-12-26 ENCOUNTER — Other Ambulatory Visit: Payer: Self-pay | Admitting: Internal Medicine

## 2019-01-03 DIAGNOSIS — G4733 Obstructive sleep apnea (adult) (pediatric): Secondary | ICD-10-CM | POA: Diagnosis not present

## 2019-01-11 ENCOUNTER — Other Ambulatory Visit: Payer: Self-pay | Admitting: Internal Medicine

## 2019-01-11 ENCOUNTER — Other Ambulatory Visit: Payer: Self-pay | Admitting: *Deleted

## 2019-01-11 MED ORDER — BUPROPION HCL ER (XL) 300 MG PO TB24: 300.0000 mg | ORAL_TABLET | Freq: Every day | ORAL | 0 refills | Status: DC

## 2019-01-11 NOTE — Telephone Encounter (Signed)
Patient requested refill Faxed to pharmacy.  

## 2019-01-25 ENCOUNTER — Other Ambulatory Visit: Payer: Medicare HMO

## 2019-01-30 ENCOUNTER — Ambulatory Visit: Payer: Medicare HMO | Admitting: Internal Medicine

## 2019-03-06 DIAGNOSIS — R69 Illness, unspecified: Secondary | ICD-10-CM | POA: Diagnosis not present

## 2019-04-06 ENCOUNTER — Other Ambulatory Visit: Payer: Self-pay | Admitting: *Deleted

## 2019-04-06 MED ORDER — LOSARTAN POTASSIUM 25 MG PO TABS: 25.0000 mg | ORAL_TABLET | Freq: Every day | ORAL | 0 refills | Status: DC

## 2019-04-10 ENCOUNTER — Other Ambulatory Visit: Payer: Self-pay | Admitting: *Deleted

## 2019-04-10 MED ORDER — TRAZODONE HCL 50 MG PO TABS: 50.0000 mg | ORAL_TABLET | Freq: Every evening | ORAL | 1 refills | Status: DC | PRN

## 2019-04-10 NOTE — Telephone Encounter (Signed)
Patient requested refill

## 2019-04-13 ENCOUNTER — Other Ambulatory Visit: Payer: Self-pay | Admitting: Internal Medicine

## 2019-05-01 ENCOUNTER — Other Ambulatory Visit: Payer: Self-pay | Admitting: *Deleted

## 2019-05-01 MED ORDER — BUPROPION HCL ER (XL) 300 MG PO TB24: 300.0000 mg | ORAL_TABLET | Freq: Every day | ORAL | 1 refills | Status: DC

## 2019-05-01 NOTE — Telephone Encounter (Signed)
Patient requested refill

## 2019-05-02 MED ORDER — METOPROLOL SUCCINATE ER 100 MG PO TB24: 100.0000 mg | ORAL_TABLET | Freq: Every day | ORAL | 0 refills | Status: DC

## 2019-05-02 NOTE — Telephone Encounter (Signed)
Please advise if ok to refill Metoprolol Succinate 100 mg qd.

## 2019-05-09 ENCOUNTER — Encounter: Payer: Self-pay | Admitting: Nurse Practitioner

## 2019-05-09 ENCOUNTER — Ambulatory Visit (INDEPENDENT_AMBULATORY_CARE_PROVIDER_SITE_OTHER): Payer: Medicare HMO | Admitting: Nurse Practitioner

## 2019-05-09 ENCOUNTER — Other Ambulatory Visit: Payer: Self-pay

## 2019-05-09 VITALS — BP 132/70 | HR 89 | Temp 98.7°F | Ht 73.0 in | Wt 213.0 lb

## 2019-05-09 DIAGNOSIS — E782 Mixed hyperlipidemia: Secondary | ICD-10-CM | POA: Diagnosis not present

## 2019-05-09 DIAGNOSIS — I25110 Atherosclerotic heart disease of native coronary artery with unstable angina pectoris: Secondary | ICD-10-CM

## 2019-05-09 DIAGNOSIS — E663 Overweight: Secondary | ICD-10-CM

## 2019-05-09 DIAGNOSIS — R300 Dysuria: Secondary | ICD-10-CM

## 2019-05-09 DIAGNOSIS — R739 Hyperglycemia, unspecified: Secondary | ICD-10-CM | POA: Diagnosis not present

## 2019-05-09 DIAGNOSIS — R350 Frequency of micturition: Secondary | ICD-10-CM

## 2019-05-09 DIAGNOSIS — I10 Essential (primary) hypertension: Secondary | ICD-10-CM

## 2019-05-09 LAB — POCT URINALYSIS DIPSTICK
Bilirubin, UA: NEGATIVE
Glucose, UA: NEGATIVE
Ketones, UA: NEGATIVE
Nitrite, UA: NEGATIVE
Protein, UA: NEGATIVE
Spec Grav, UA: 1.01 (ref 1.010–1.025)
Urobilinogen, UA: 0.2 E.U./dL
pH, UA: 6.5 (ref 5.0–8.0)

## 2019-05-09 MED ORDER — AMOXICILLIN-POT CLAVULANATE 875-125 MG PO TABS: 1.0000 | ORAL_TABLET | Freq: Two times a day (BID) | ORAL | 0 refills | Status: DC

## 2019-05-09 MED ORDER — PHENAZOPYRIDINE HCL 97.2 MG PO TABS: 97.0000 mg | ORAL_TABLET | Freq: Two times a day (BID) | ORAL | 0 refills | Status: DC | PRN

## 2019-05-09 NOTE — Progress Notes (Signed)
Careteam: Patient Care Team: Gayland Curry, DO as PCP - General (Geriatric Medicine) [place], Peter M, MD as PCP - Cardiology (Cardiology) [name], [place] as Referring Physician (Dermatology) [name], MD as Referring Physician (Otolaryngology)  Advanced Directive information    Allergies  Allergen Reactions  . Pollen Extract Other (See Comments)    Stuffy nose/itchy throat (Seasonal allergies)    Chief Complaint  Patient presents with  . Acute Visit    Frequent urination. Patient is urinating every 20 min at night.   . Sinus Problem    Patient c/o headache since Sunday evening. Taking 3 EX Tylenol daily      HPI: Patient is a 73 y.o. male seen in the office today due to frequent urination.  Reports he has been "sick as a day" for 2 days.  Every 15 to 20 mins going to the bathroom.  Reports he has not increased fluids enough- states he generally drinks 94 oz but not yesterday.  Urine normal gets a little orange over night which is baseline. Normally during the day light to clear.  Unknown if he has had fever  Started having a headache when symptoms of urination.  Taking tylenol which helps symptoms  Taking tylenol 3 of the extra strength every 6 hours Denies loss of taste or smell. No cough Reports nasal congestion- has chronic sinusitis. Has abt 2 sinus infections a year.  Never had a UTI before.  Painful sitting. Pressure in lower abdomen.  Pain with urination.     Review of Systems:  Review of Systems  Constitutional: Negative for chills, fever and malaise/fatigue.  Gastrointestinal: Positive for abdominal pain (tenderness). Negative for blood in stool, constipation and diarrhea.  Genitourinary: Positive for dysuria, frequency and urgency. Negative for flank pain and hematuria.  Neurological: Positive for headaches.    Past Medical History:  Diagnosis Date  . Coronary artery disease    a. 06/2017 MV: EF 45%, large inf and apical defect w/o  ischemia;  b. 06/2017 Cath: LM nl, LAD 30ost, LCX 50p, OM1/2 ok, RCA 100p w/ L->R collats. EF 35%.  . Depression with anxiety   . Gastric ulcer, acute with hemorrhage 2001   Dr. Sharlet Salina, [facility]  . H/O congenital atrial septal defect (ASD) repair   . Helicobacter pylori gastritis   . Heme positive stool   . History of gout   . History of peptic ulcer disease    had bleeding ulcers requiring transfusion  . History of PSVT (paroxysmal supraventricular tachycardia)   . Hyperlipidemia   . Hypertension   . Ischemic cardiomyopathy    a. 06/2017 LV gram: EF 35%.  . Obstructive sleep apnea    on CPAP  . Perennial allergic rhinitis   . Tobacco abuse    Past Surgical History:  Procedure Laterality Date  . ASD REPAIR, OSTIUM PRIMUM  1966  . 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 LEFT HEART CATH AND CORONARY ANGIOGRAPHY Left 06/30/2017   Procedure: LEFT HEART CATH AND CORONARY ANGIOGRAPHY;  Surgeon: Minna Merritts, MD;  Location: [facility];  Service: Cardiovascular;  Laterality: Left;  . open heart surgery  1966   Dr. Annamaria Boots ([facility]); was PFO repair--has been normal in f/u  . PALATE / UVULA BIOPSY / EXCISION  2000   Dr.Sprenhe [facility])  . TONSILLECTOMY AND ADENOIDECTOMY  1959   Social History:   reports that he quit smoking about 3 years ago. He quit after 40.00  years of use. He has never used smokeless tobacco. He reports that he does not drink alcohol or use drugs.  Family History  Problem Relation Age of Onset  . Failure to thrive Mother   . Arthritis Mother     Medications: Patient's Medications  New Prescriptions   No medications on file  Previous Medications   ASPIRIN EC 81 MG TABLET    Take 81 mg by mouth daily.   BUPROPION (WELLBUTRIN XL) 300 MG 24 HR TABLET    Take 1 tablet (300 mg total) by mouth daily.   EZETIMIBE (ZETIA) 10 MG TABLET    TAKE ONE TABLET BY MOUTH DAILY   ISOSORBIDE MONONITRATE (IMDUR) 30 MG 24 HR TABLET    Take 0.5 tablets (15 mg  total) by mouth daily.   LOSARTAN (COZAAR) 25 MG TABLET    Take 1 tablet (25 mg total) by mouth daily.   METOPROLOL SUCCINATE (TOPROL-XL) 100 MG 24 HR TABLET    Take 1 tablet (100 mg total) by mouth daily.   NAPHAZOLINE-GLYCERIN (CLEAR EYES) 0.012-0.2 % SOLN    Place 1-2 drops into both eyes 4 (four) times daily as needed (for redness).   NITROGLYCERIN (NITROSTAT) 0.4 MG SL TABLET    PLACE 1 TABLET (0.4 MG TOTAL) UNDER THE TONGUE EVERY 5 (FIVE) MINUTES AS NEEDED FOR CHEST PAIN.   PROPRANOLOL (INDERAL) 20 MG TABLET    Take 1 tablet (20 mg total) by mouth as needed (As needed for fast heart rates).   SENNA (SENOKOT) 8.6 MG TABLET    Take 2 tablets by mouth at bedtime.   SIMVASTATIN (ZOCOR) 40 MG TABLET    TAKE 1 TABLET BY MOUTH EVERY DAY   SPIRONOLACTONE (ALDACTONE) 25 MG TABLET    Take 1 tablet (25 mg total) by mouth daily.   TRAZODONE (DESYREL) 50 MG TABLET    Take 1 tablet (50 mg total) by mouth at bedtime as needed. for insomnia  Modified Medications   No medications on file  Discontinued Medications   AMOXICILLIN-CLAVULANATE (AUGMENTIN) 875-125 MG TABLET    Take 1 tablet by mouth 2 (two) times daily.    Physical Exam:  Vitals:   05/09/19 0932  BP: 132/70  Pulse: 89  Temp: 98.7 F (37.1 C)  TempSrc: Oral  SpO2: 97%  Weight: 213 lb (96.6 kg)  Height: 6\' 1"  (1.854 m)   Body mass index is 28.1 kg/m. Wt Readings from Last 3 Encounters:  05/09/19 213 lb (96.6 kg)  09/22/18 217 lb (98.4 kg)  09/07/18 216 lb (98 kg)    Physical Exam Vitals signs reviewed.  Constitutional:      General: He is not in acute distress.    Appearance: Normal appearance. He is not toxic-appearing.  HENT:     Head: Normocephalic and atraumatic.  Eyes:     Extraocular Movements: Extraocular movements intact.     Conjunctiva/sclera: Conjunctivae normal.     Pupils: Pupils are equal, round, and reactive to light.  Neck:     Musculoskeletal: Neck supple.  Cardiovascular:     Rate and Rhythm: Normal  rate and regular rhythm.     Heart sounds: Normal heart sounds.  Pulmonary:     Effort: Pulmonary effort is normal.     Breath sounds: Normal breath sounds.  Abdominal:     General: Bowel sounds are normal. There is no distension.     Palpations: Abdomen is soft. There is no mass.     Tenderness: There is abdominal tenderness (  suprapubic).  Musculoskeletal: Normal range of motion.     Right lower leg: No edema.     Left lower leg: No edema.  Skin:    General: Skin is warm and dry.  Neurological:     Mental Status: He is alert and oriented to person, place, and time.  Psychiatric:        Mood and Affect: Mood normal.        Behavior: Behavior normal.        Thought Content: Thought content normal.        Judgment: Judgment normal.     Labs reviewed: Basic Metabolic Panel: Recent Labs    05/10/18 1428  NA 136  K 4.5  CL 104  CO2 28  GLUCOSE 113*  BUN 23  CREATININE 1.36*  CALCIUM 9.0   Liver Function Tests: Recent Labs    05/10/18 1428  AST 21  ALT 18  ALKPHOS 41  BILITOT 0.7  PROT 6.7  ALBUMIN 4.0   Recent Labs    05/10/18 1428  LIPASE 30   No results for input(s): AMMONIA in the last 8760 hours. CBC: Recent Labs    05/10/18 1428  WBC 8.9  NEUTROABS 6.0  HGB 13.7  HCT 40.0  MCV 92.8  PLT 227   Lipid Panel: No results for input(s): CHOL, HDL, LDLCALC, TRIG, CHOLHDL, LDLDIRECT in the last 8760 hours. TSH: No results for input(s): TSH in the last 8760 hours. A1C: Lab Results  Component Value Date   HGBA1C 5.8 (H) 04/04/2018     Assessment/Plan 1. Urinary frequency -ongoing for 2 days, encouraged to increase hydration - POC Urinalysis Dipstick abnormal with leukocytes and blood - Urine Culture - phenazopyridine (PYRIDIUM) 97 MG tablet; Take 1 tablet (97 mg total) by mouth 2 (two) times daily as needed for pain.  Dispense: 6 tablet; Refill: 0 - amoxicillin-clavulanate (AUGMENTIN) 875-125 MG tablet; Take 1 tablet by mouth 2 (two) times  daily.  Dispense: 20 tablet; Refill: 0 -CBC  2. Dysuria - phenazopyridine (PYRIDIUM) 97 MG tablet; Take 1 tablet (97 mg total) by mouth 2 (two) times daily as needed for pain.  Dispense: 6 tablet; Refill: 0 - amoxicillin-clavulanate (AUGMENTIN) 875-125 MG tablet; Take 1 tablet by mouth 2 (two) times daily.  Dispense: 20 tablet; Refill: 0  3. Overweight (BMI 25.0-29.9) -labs done today for follow up - Hemoglobin A1c - Lipid panel  4. Hyperglycemia -labs done for follow up - Hemoglobin A1c  5. Mixed hyperlipidemia Labs done today - Lipid panel - COMPLETE METABOLIC PANEL WITH GFR - CBC with Differential/Platelet  6. Coronary artery disease involving native coronary artery of native heart with unstable angina pectoris ([facility]) - COMPLETE METABOLIC PANEL WITH GFR  7. Essential hypertension, benign -stable on current regimen - COMPLETE METABOLIC PANEL WITH GFR  Next appt: to keep follow up as scheduled, sooner if needed  Tavio Biegel K. [place], [facility] [phone]

## 2019-05-09 NOTE — Patient Instructions (Addendum)
TYLENOL 500 mg 1-2 tablets every 8 hours as needed for pain MAX DOSE OF TYLENOL is 3000 mg in 24 hours.  To only use 1000 mg at one time  To use pyridium 97 mg by mouth every 12 hours as needed for urinary pain- max of 2 pills in 24 hours and to not use for more than 3 days  To start Augmentin twice daily with food To take probiotic or yogurt to help gut health  Keep follow up with Dr Mariea Clonts

## 2019-05-10 ENCOUNTER — Telehealth: Payer: Self-pay

## 2019-05-10 LAB — CBC WITH DIFFERENTIAL/PLATELET
Absolute Monocytes: 1697 cells/uL — ABNORMAL HIGH (ref 200–950)
Basophils Absolute: 40 cells/uL (ref 0–200)
Basophils Relative: 0.2 %
Eosinophils Absolute: 20 cells/uL (ref 15–500)
Eosinophils Relative: 0.1 %
HCT: 40 % (ref 38.5–50.0)
Hemoglobin: 13.6 g/dL (ref 13.2–17.1)
Lymphs Abs: 1192 cells/uL (ref 850–3900)
MCH: 30.9 pg (ref 27.0–33.0)
MCHC: 34 g/dL (ref 32.0–36.0)
MCV: 90.9 fL (ref 80.0–100.0)
MPV: 10.5 fL (ref 7.5–12.5)
Monocytes Relative: 8.4 %
Neutro Abs: 17251 cells/uL — ABNORMAL HIGH (ref 1500–7800)
Neutrophils Relative %: 85.4 %
Platelets: 221 10*3/uL (ref 140–400)
RBC: 4.4 10*6/uL (ref 4.20–5.80)
RDW: 12.8 % (ref 11.0–15.0)
Total Lymphocyte: 5.9 %
WBC: 20.2 10*3/uL — ABNORMAL HIGH (ref 3.8–10.8)

## 2019-05-10 LAB — COMPLETE METABOLIC PANEL WITH GFR
AG Ratio: 1.8 (calc) (ref 1.0–2.5)
ALT: 13 U/L (ref 9–46)
AST: 15 U/L (ref 10–35)
Albumin: 4.1 g/dL (ref 3.6–5.1)
Alkaline phosphatase (APISO): 45 U/L (ref 35–144)
BUN/Creatinine Ratio: 11 (calc) (ref 6–22)
BUN: 14 mg/dL (ref 7–25)
CO2: 29 mmol/L (ref 20–32)
Calcium: 9.2 mg/dL (ref 8.6–10.3)
Chloride: 98 mmol/L (ref 98–110)
Creat: 1.26 mg/dL — ABNORMAL HIGH (ref 0.70–1.18)
GFR, Est African American: 65 mL/min/{1.73_m2} (ref >=60)
GFR, Est Non African American: 56 mL/min/{1.73_m2} — ABNORMAL LOW (ref >=60)
Globulin: 2.3 g/dL (calc) (ref 1.9–3.7)
Glucose, Bld: 103 mg/dL — ABNORMAL HIGH (ref 65–99)
Potassium: 4.4 mmol/L (ref 3.5–5.3)
Sodium: 134 mmol/L — ABNORMAL LOW (ref 135–146)
Total Bilirubin: 0.9 mg/dL (ref 0.2–1.2)
Total Protein: 6.4 g/dL (ref 6.1–8.1)

## 2019-05-10 LAB — LIPID PANEL
Cholesterol: 117 mg/dL (ref <200)
HDL: 58 mg/dL (ref >=40)
LDL Cholesterol (Calc): 43 mg/dL (calc)
Non-HDL Cholesterol (Calc): 59 mg/dL (calc) (ref <130)
Total CHOL/HDL Ratio: 2 (calc) (ref <5.0)
Triglycerides: 81 mg/dL (ref <150)

## 2019-05-10 LAB — HEMOGLOBIN A1C
Hgb A1c MFr Bld: 5.8 % of total Hgb — ABNORMAL HIGH (ref <5.7)
Mean Plasma Glucose: 120 (calc)
eAG (mmol/L): 6.6 (calc)

## 2019-05-10 NOTE — Telephone Encounter (Signed)
Patient was seen yesterday for possible uti in office and lab work showed increased WBC. Provider Sherrie Mustache NP) wanted to check on patient and see how he was feeling. Tried calling patient. No answer. LMOM for patient to call office.

## 2019-05-11 ENCOUNTER — Encounter: Payer: Self-pay | Admitting: Internal Medicine

## 2019-05-11 ENCOUNTER — Telehealth: Payer: Self-pay

## 2019-05-11 ENCOUNTER — Ambulatory Visit (INDEPENDENT_AMBULATORY_CARE_PROVIDER_SITE_OTHER): Payer: Medicare HMO | Admitting: Internal Medicine

## 2019-05-11 ENCOUNTER — Other Ambulatory Visit: Payer: Self-pay

## 2019-05-11 VITALS — BP 110/60 | HR 62 | Temp 97.8°F | Ht 73.0 in | Wt 210.0 lb

## 2019-05-11 DIAGNOSIS — N410 Acute prostatitis: Secondary | ICD-10-CM

## 2019-05-11 DIAGNOSIS — D72825 Bandemia: Secondary | ICD-10-CM

## 2019-05-11 LAB — URINE CULTURE
MICRO NUMBER:: 735211
SPECIMEN QUALITY:: ADEQUATE

## 2019-05-11 MED ORDER — CEFTRIAXONE SODIUM 1 G IJ SOLR
1.0000 g | Freq: Once | INTRAMUSCULAR | Status: AC
  Administered 2019-05-11: 1 g via INTRAMUSCULAR

## 2019-05-11 MED ORDER — METHOCARBAMOL 500 MG PO TABS: 500.0000 mg | ORAL_TABLET | Freq: Four times a day (QID) | ORAL | 0 refills | Status: DC | PRN

## 2019-05-11 NOTE — Telephone Encounter (Signed)
Lets see if he can come in and see Dr Mariea Clonts his PCP

## 2019-05-11 NOTE — Telephone Encounter (Signed)
Had tried calling patient yesterday to check on how he was feeling. Patient did not call back yesterday.   Today, patient called and stated his symptoms have not improved and medication does not seem to be working like he had anticipated. Patient states he is still feeling terrible and would like to know if Mitchell Stephens would like to continue on this treatment. Routing message to provider. Please advise.

## 2019-05-11 NOTE — Patient Instructions (Addendum)
Complete the course of Augmentin therapy.   Continue to drink plenty of water and cranberry juice. We gave you a dose of Rocephin antibiotic to kickstart the antibiotic effect.   You may try sitz baths to help relieve the discomfort in your private area.   I also gave you a muscle relaxer.  Prostatitis  Prostatitis is swelling or inflammation of the prostate gland. The prostate is a walnut-sized gland that is involved in the production of semen. It is located below a man's bladder, in front of the rectum. There are four types of prostatitis:  Chronic nonbacterial prostatitis. This is the most common type of prostatitis. It may be associated with a viral infection or autoimmune disorder.  Acute bacterial prostatitis. This is the least common type of prostatitis. It starts quickly and is usually associated with a bladder infection, high fever, and shaking chills. It can occur at any age.  Chronic bacterial prostatitis. This type usually results from acute bacterial prostatitis that happens repeatedly (is recurrent) or has not been treated properly. It can occur in men of any age but is most common among middle-aged men whose prostate has begun to get larger. The symptoms are not as severe as symptoms caused by acute bacterial prostatitis.  Prostatodynia or chronic pelvic pain syndrome (CPPS). This type is also called pelvic floor disorder. It is associated with increased muscular tone in the pelvis surrounding the prostate. What are the causes? Bacterial prostatitis is caused by infection from bacteria. Chronic nonbacterial prostatitis may be caused by:  Urinary tract infections (UTIs).  Nerve damage.  A response by the body's disease-fighting system (autoimmune response).  Chemicals in the urine. The causes of the other types of prostatitis are usually not known. What are the signs or symptoms? Symptoms of this condition vary depending upon the type of prostatitis. If you have acute  bacterial prostatitis, you may experience:  Urinary symptoms, such as: ? Painful urination. ? Burning during urination. ? Frequent and sudden urges to urinate. ? Inability to start urinating. ? A weak or interrupted stream of urine.  Vomiting.  Nausea.  Fever.  Chills.  Inability to empty the bladder completely.  Pain in the: ? Muscles or joints. ? Lower back. ? Lower abdomen. If you have any of the other types of prostatitis, you may experience:  Urinary symptoms, such as: ? Sudden urges to urinate. ? Frequent urination. ? Difficulty starting urination. ? Weak urine stream. ? Dribbling after urination.  Discharge from the urethra. The urethra is a tube that opens at the end of the penis.  Pain in the: ? Testicles. ? Penis or tip of the penis. ? Rectum. ? Area in front of the rectum and below the scrotum (perineum).  Problems with sexual function.  Painful ejaculation.  Bloody semen. How is this diagnosed? This condition may be diagnosed based on:  A physical and medical exam.  Your symptoms.  A urine test to check for bacteria.  An exam in which a health care provider uses a finger to feel the prostate (digital rectal exam).  A test of a sample of semen.  Blood tests.  Ultrasound.  Removal of prostate tissue to be examined under a microscope (biopsy).  Tests to check how your body handles urine (urodynamic tests).  A test to look inside your bladder or urethra (cystoscopy). How is this treated? Treatment for this condition depends on the type of prostatitis. Treatment may involve:  Medicines to relieve pain or inflammation.  Medicines to  help relax your muscles.  Physical therapy.  Heat therapy.  Techniques to help you control certain body functions (biofeedback).  Relaxation exercises.  Antibiotic medicine, if your condition is caused by bacteria.  Warm water baths (sitz baths). Sitz baths help with relaxing your pelvic floor  muscles, which helps to relieve pressure on the prostate. Follow these instructions at home:   Take over-the-counter and prescription medicines only as told by your health care provider.  If you were prescribed an antibiotic, take it as told by your health care provider. Do not stop taking the antibiotic even if you start to feel better.  If physical therapy, biofeedback, or relaxation exercises were prescribed, do exercises as instructed.  Take sitz baths as directed by your health care provider. For a sitz bath, sit in warm water that is deep enough to cover your hips and buttocks.  Keep all follow-up visits as told by your health care provider. This is important. Contact a health care provider if:  Your symptoms get worse.  You have a fever. Get help right away if:  You have chills.  You feel nauseous.  You vomit.  You feel light-headed or feel like you are going to faint.  You are unable to urinate.  You have blood or blood clots in your urine. This information is not intended to replace advice given to you by your health care provider. Make sure you discuss any questions you have with your health care provider. Document Released: 09/18/2000 Document Revised: 12/04/2017 Document Reviewed: 06/11/2016 Elsevier Patient Education  2020 [employer].

## 2019-05-11 NOTE — Telephone Encounter (Signed)
Called patient and added him to Dr. Cyndi Lennert schedule for today.

## 2019-05-11 NOTE — Progress Notes (Signed)
Location:  [facility] clinic Provider: Brigida Scotti L. Mariea Clonts, D.O., C.M.D.  Goals of Care:  Advanced Directives 09/07/2018  Does Patient Have a Medical Advance Directive? Yes  Type of Paramedic of [place];Living will  Does patient want to make changes to medical advance directive? No - Patient declined  Copy of [place] in Chart? Yes - validated most recent copy scanned in chart (See row information)  Would patient like information on creating a medical advance directive? -   Chief Complaint  Patient presents with  . Acute Visit    frequent urination, pain    HPI: Patient is a 73 y.o. male seen today for an acute visit for  Sunday evening to Monday morning he developed a constant need to urinate.  It hurt--there was pain and urgency to go to the bathroom.  He could make it 15-20 mins w/o going, would fall asleep maybe an hour max.  Started to drink fluids.  Went to bed Tuesday night and was up every 15-30 mins.  Last night, it seemed a little better, but he also took a xanax, trazodone and two tylenol.  That settled it enough for him to sleep an hour last night.  No real sleep in three nights.  Azo he's not sure helped him.  Used the whole pack and did not relieve the pain. Pain is in his penis.  The whole area is irritated--felt like he needed to defecate.  He's not eaten much in three days either.  Just hydrating.  Only a dribble will come out.  He's had some good urinations, but has to struggle to keep stream going.  He looked bloated/swollen/distended per his wife.  Night before last, he did wake up sweaty and had to change t-shirts.     Past Medical History:  Diagnosis Date  . Coronary artery disease    a. 06/2017 MV: EF 45%, large inf and apical defect w/o ischemia;  b. 06/2017 Cath: LM nl, LAD 30ost, LCX 50p, OM1/2 ok, RCA 100p w/ L->R collats. EF 35%.  . Depression with anxiety   . Gastric ulcer, acute with hemorrhage 2001   Dr. Sharlet Salina, [facility]  .  H/O congenital atrial septal defect (ASD) repair   . Helicobacter pylori gastritis   . Heme positive stool   . History of gout   . History of peptic ulcer disease    had bleeding ulcers requiring transfusion  . History of PSVT (paroxysmal supraventricular tachycardia)   . Hyperlipidemia   . Hypertension   . Ischemic cardiomyopathy    a. 06/2017 LV gram: EF 35%.  . Obstructive sleep apnea    on CPAP  . Perennial allergic rhinitis   . Tobacco abuse     Past Surgical History:  Procedure Laterality Date  . ASD REPAIR, OSTIUM PRIMUM  1966  . 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 LEFT HEART CATH AND CORONARY ANGIOGRAPHY Left 06/30/2017   Procedure: LEFT HEART CATH AND CORONARY ANGIOGRAPHY;  Surgeon: Minna Merritts, MD;  Location: [facility];  Service: Cardiovascular;  Laterality: Left;  . open heart surgery  1966   Dr. Annamaria Boots ([facility]); was PFO repair--has been normal in f/u  . PALATE / UVULA BIOPSY / EXCISION  2000   Dr[facility]ren[facility])  . TONSILLECTOMY AND ADENOIDECTOMY  1959    Allergies  Allergen Reactions  . Pollen Extract Other (See Comments)    Stuffy nose/itchy throat (Seasonal allergies)  Outpatient Encounter Medications as of 05/11/2019  Medication Sig  . amoxicillin-clavulanate (AUGMENTIN) 875-125 MG tablet Take 1 tablet by mouth 2 (two) times daily.  Marland Kitchen aspirin EC 81 MG tablet Take 81 mg by mouth daily.  Marland Kitchen buPROPion (WELLBUTRIN XL) 300 MG 24 hr tablet Take 1 tablet (300 mg total) by mouth daily.  Marland Kitchen ezetimibe (ZETIA) 10 MG tablet TAKE ONE TABLET BY MOUTH DAILY  . isosorbide mononitrate (IMDUR) 30 MG 24 hr tablet Take 0.5 tablets (15 mg total) by mouth daily.  Marland Kitchen losartan (COZAAR) 25 MG tablet Take 1 tablet (25 mg total) by mouth daily.  . metoprolol succinate (TOPROL-XL) 100 MG 24 hr tablet Take 1 tablet (100 mg total) by mouth daily.  . naphazoline-glycerin (CLEAR EYES) 0.012-0.2 % SOLN Place 1-2 drops into both eyes 4 (four) times daily as  needed (for redness).  . nitroGLYCERIN (NITROSTAT) 0.4 MG SL tablet PLACE 1 TABLET (0.4 MG TOTAL) UNDER THE TONGUE EVERY 5 (FIVE) MINUTES AS NEEDED FOR CHEST PAIN.  Marland Kitchen phenazopyridine (PYRIDIUM) 97 MG tablet Take 1 tablet (97 mg total) by mouth 2 (two) times daily as needed for pain.  Marland Kitchen propranolol (INDERAL) 20 MG tablet Take 1 tablet (20 mg total) by mouth as needed (As needed for fast heart rates).  . senna (SENOKOT) 8.6 MG tablet Take 2 tablets by mouth at bedtime.  . simvastatin (ZOCOR) 40 MG tablet TAKE 1 TABLET BY MOUTH EVERY DAY  . spironolactone (ALDACTONE) 25 MG tablet Take 1 tablet (25 mg total) by mouth daily.  . traZODone (DESYREL) 50 MG tablet Take 1 tablet (50 mg total) by mouth at bedtime as needed. for insomnia   No facility-administered encounter medications on file as of 05/11/2019.     Review of Systems:  Review of Systems  Constitutional: Positive for malaise/fatigue. Negative for chills and fever.  HENT: Negative for congestion.   Eyes: Negative for blurred vision.  Respiratory: Negative for cough and shortness of breath.   Cardiovascular: Negative for chest pain, palpitations and leg swelling.  Gastrointestinal: Negative for abdominal pain, blood in stool, constipation, diarrhea and melena.  Genitourinary: Positive for dysuria, frequency and urgency. Negative for flank pain and hematuria.  Musculoskeletal: Negative for back pain, falls and joint pain.       Back better  Skin: Negative for itching and rash.  Neurological: Negative for dizziness and loss of consciousness.  Endo/Heme/Allergies: Does not bruise/bleed easily.  Psychiatric/Behavioral: Negative for depression and memory loss. The patient has insomnia. The patient is not nervous/anxious.        Not sleeping now due to nocturia/frequency    Health Maintenance  Topic Date Due  . Hepatitis C Screening  27-Jan-1946  . TETANUS/TDAP  07/31/1964  . COLONOSCOPY  03/22/2019  . INFLUENZA VACCINE  05/06/2019  . PNA  vac Low Risk Adult  Completed    Physical Exam: Vitals:   05/11/19 1449  BP: 110/60  Pulse: 62  Temp: 97.8 F (36.6 C)  TempSrc: Oral  SpO2: 97%  Weight: 210 lb (95.3 kg)  Height: 6\' 1"  (1.854 m)   Body mass index is 27.71 kg/m. Physical Exam Vitals signs reviewed. Nursing note reviewed: NP note reviewed   Constitutional:      General: He is in acute distress.     Appearance: Normal appearance. He is normal weight. He is not ill-appearing or toxic-appearing.  HENT:     Head: Normocephalic and atraumatic.  Cardiovascular:     Rate and Rhythm: Normal rate and regular  rhythm.  Pulmonary:     Effort: Pulmonary effort is normal.     Breath sounds: Normal breath sounds.  Abdominal:     General: Bowel sounds are normal. There is no distension.     Palpations: Abdomen is soft. There is no mass.     Tenderness: There is no abdominal tenderness. There is no guarding or rebound.  Genitourinary:    Comments: Pain when going to sit down to where he was near tears Musculoskeletal: Normal range of motion.  Skin:    General: Skin is warm and dry.  Neurological:     General: No focal deficit present.     Mental Status: He is alert and oriented to person, place, and time.  Psychiatric:        Mood and Affect: Mood normal.     Labs reviewed: Basic Metabolic Panel: Recent Labs    05/09/19 1008  NA 134*  K 4.4  CL 98  CO2 29  GLUCOSE 103*  BUN 14  CREATININE 1.26*  CALCIUM 9.2   Liver Function Tests: Recent Labs    05/09/19 1008  AST 15  ALT 13  BILITOT 0.9  PROT 6.4   No results for input(s): LIPASE, AMYLASE in the last 8760 hours. No results for input(s): AMMONIA in the last 8760 hours. CBC: Recent Labs    05/09/19 1008  WBC 20.2*  NEUTROABS 17,251*  HGB 13.6  HCT 40.0  MCV 90.9  PLT 221   Lipid Panel: Recent Labs    05/09/19 1008  CHOL 117  HDL 58  LDLCALC 43  TRIG 81  CHOLHDL 2.0   Lab Results  Component Value Date   HGBA1C 5.8 (H)  05/09/2019    Assessment/Plan 1. Acute prostatitis -suspect he has this rather than cystitis given severity of perineal pain, leukocytosis -continue to hydrate with water and cranberry juice -complete course of augmentin as E coli in urine susceptible to it and some improvement in suprapubic pain - methocarbamol (ROBAXIN) 500 MG tablet; Take 1 tablet (500 mg total) by mouth every 6 (six) hours as needed for muscle spasms.  Dispense: 28 tablet; Refill: 0 in perineal area - cefTRIAXone (ROCEPHIN) injection 1 g IM once here in office to kickstart abx effect -sitz baths for pain -tylenol but avoid taking more than 3g/day -avoid nsaids and steroids with his known CAD  2. Bandemia -due to bacterial infection above  Labs/tests ordered:  No new  Next appt:  06/01/2019  Jessicah Croll L. Zariah Cavendish, D.O. [facility] [address], [place] [address] Cell Phone (Mon-Fri 8am-5pm):  [phone] On Call:  [phone] & follow prompts after 5pm & weekends Office Phone:  [phone] Office Fax:  [phone]

## 2019-05-12 ENCOUNTER — Other Ambulatory Visit: Payer: Self-pay | Admitting: *Deleted

## 2019-05-12 MED ORDER — LOSARTAN POTASSIUM 25 MG PO TABS: 25.0000 mg | ORAL_TABLET | Freq: Every day | ORAL | 1 refills | Status: DC

## 2019-05-12 NOTE — Telephone Encounter (Signed)
Patient requested refill. Faxed.  

## 2019-05-18 ENCOUNTER — Telehealth: Payer: Self-pay | Admitting: *Deleted

## 2019-05-18 DIAGNOSIS — N410 Acute prostatitis: Secondary | ICD-10-CM

## 2019-05-18 MED ORDER — AMOXICILLIN-POT CLAVULANATE 875-125 MG PO TABS: 1.0000 | ORAL_TABLET | Freq: Two times a day (BID) | ORAL | 0 refills | Status: DC

## 2019-05-18 NOTE — Telephone Encounter (Signed)
I have sent in 4 more days of augmentin therapy to his local pharmacy.  I'm glad he's almost better.  The antibiotics will actually stay in his system for several more days after he finishes them.  He can also use the sitz baths for his discomfort and the muscle relaxer pills.    Lydon Vansickle L. Gerlene Glassburn, D.O. [facility] [address],  [address] Cell Phone (Mon-Fri 8am-5pm):  [phone] On Call:  [phone] & follow prompts after 5pm & weekends Office Phone:  [phone] Office Fax:  [phone]

## 2019-05-18 NOTE — Telephone Encounter (Signed)
Patient called and stated that he finishes his antibiotic today but feels like he is not completely better yet and wants to know if he should get a refill and keep taking for a couple more days.  Stated that he is a lot better, about 90%, but still feels like he is not all the way. I asked him to explain and he stated that he still has a little bit of a feeling down there that it is not 100%. Please Advise.

## 2019-05-18 NOTE — Telephone Encounter (Signed)
Patient notified. Left detailed message on VM

## 2019-05-29 ENCOUNTER — Other Ambulatory Visit: Payer: Medicare HMO

## 2019-06-01 ENCOUNTER — Encounter: Payer: Self-pay | Admitting: Internal Medicine

## 2019-06-01 ENCOUNTER — Other Ambulatory Visit: Payer: Self-pay

## 2019-06-01 ENCOUNTER — Ambulatory Visit (INDEPENDENT_AMBULATORY_CARE_PROVIDER_SITE_OTHER): Payer: Medicare HMO | Admitting: Internal Medicine

## 2019-06-01 VITALS — BP 118/60 | HR 62 | Temp 97.8°F | Ht 73.0 in | Wt 210.0 lb

## 2019-06-01 DIAGNOSIS — E782 Mixed hyperlipidemia: Secondary | ICD-10-CM

## 2019-06-01 DIAGNOSIS — Z23 Encounter for immunization: Secondary | ICD-10-CM | POA: Diagnosis not present

## 2019-06-01 DIAGNOSIS — K635 Polyp of colon: Secondary | ICD-10-CM | POA: Diagnosis not present

## 2019-06-01 DIAGNOSIS — I429 Cardiomyopathy, unspecified: Secondary | ICD-10-CM

## 2019-06-01 DIAGNOSIS — F324 Major depressive disorder, single episode, in partial remission: Secondary | ICD-10-CM | POA: Diagnosis not present

## 2019-06-01 DIAGNOSIS — I471 Supraventricular tachycardia, unspecified: Secondary | ICD-10-CM

## 2019-06-01 DIAGNOSIS — I25110 Atherosclerotic heart disease of native coronary artery with unstable angina pectoris: Secondary | ICD-10-CM

## 2019-06-01 DIAGNOSIS — R739 Hyperglycemia, unspecified: Secondary | ICD-10-CM

## 2019-06-01 DIAGNOSIS — R69 Illness, unspecified: Secondary | ICD-10-CM | POA: Diagnosis not present

## 2019-06-01 NOTE — Progress Notes (Signed)
Location:  [facility] clinic Provider:  Chad Donoghue L. Mariea Clonts, D.O., C.M.D.  Goals of Care:  Advanced Directives 09/07/2018  Does Patient Have a Medical Advance Directive? Yes  Type of Paramedic of [place];Living will  Does patient want to make changes to medical advance directive? No - Patient declined  Copy of [place] in Chart? Yes - validated most recent copy scanned in chart (See row information)  Would patient like information on creating a medical advance directive? -     Chief Complaint  Patient presents with  . Medical Management of Chronic Issues    follow-up    HPI: Patient is a 73 y.o. male seen today for medical management of chronic diseases.    He had prostatitis.  He thinks he is fully recovered.  When he was on augmentin, he's not sure if he had begun bleeding before and his stools were dark.  It did resolve.    HR had been spiking on his fit bit before his first appt with NP about his urinary symptoms.  For a whole week, HR would be fine in the daytime, but at night, he was getting rapid heartbeat.  He woke up one time, took a propranolol.  HR stayed elevated until morning.  No problem since off antibiotics.    He has read two articles about coronavirus and the heart.  ACC article about losartan and covid--it suggested that ARBs could enhance the severity of the disease.  The other article said there's no certainty about this either way.  He has a 50-60% blockage in his circumflex and he worries about if this has built up more plaque at this point especially since he was asymptomatic before.  His recovery with walking and exercise is excellent--bp and pulse come down appropriately.    He's been better with his eating habits and continues to walk daily.  Na was mildly low and we discussed that this was probably due to hydration with water.    He's had some chest pressure walking at times which he attributes to the barometric  pressure.  He walks at a 53mph pace at least 30 mins.  Had one little twinge of throat pain when he had his MI.  No real angina lately--several weeks.      Depression:  Says mood is up and down.  He thinks about his health every day.  Sleeping really well except if he has to get up and urinate.  May have spurts where he only has to get up once, then it might be 3x per night other times.  He is getting his lung cancer screening--he's going to call [facility] to schedule.    He wants to wait on the colonoscopy at [place] for his polyps until later.   He is going to wait until he gets cut or something to get tdap.    He wants his flu shot today.  Past Medical History:  Diagnosis Date  . Coronary artery disease    a. 06/2017 MV: EF 45%, large inf and apical defect w/o ischemia;  b. 06/2017 Cath: LM nl, LAD 30ost, LCX 50p, OM1/2 ok, RCA 100p w/ L->R collats. EF 35%.  . Depression with anxiety   . Gastric ulcer, acute with hemorrhage 2001   Dr. Sharlet Salina, [facility]  . H/O congenital atrial septal defect (ASD) repair   . Helicobacter pylori gastritis   . Heme positive stool   . History of gout   . History  of peptic ulcer disease    had bleeding ulcers requiring transfusion  . History of PSVT (paroxysmal supraventricular tachycardia)   . Hyperlipidemia   . Hypertension   . Ischemic cardiomyopathy    a. 06/2017 LV gram: EF 35%.  . Obstructive sleep apnea    on CPAP  . Perennial allergic rhinitis   . Tobacco abuse     Past Surgical History:  Procedure Laterality Date  . ASD REPAIR, OSTIUM PRIMUM  1966  . 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 LEFT HEART CATH AND CORONARY ANGIOGRAPHY Left 06/30/2017   Procedure: LEFT HEART CATH AND CORONARY ANGIOGRAPHY;  Surgeon: Minna Merritts, MD;  Location: [facility];  Service: Cardiovascular;  Laterality: Left;  . open heart surgery  1966   Dr. Annamaria Boots ([facility]); was PFO repair--has been normal in f/u  . PALATE / UVULA  BIOPSY / EXCISION  2000   Dr.Sprenhe [facility])  . TONSILLECTOMY AND ADENOIDECTOMY  1959    Allergies  Allergen Reactions  . Pollen Extract Other (See Comments)    Stuffy nose/itchy throat (Seasonal allergies)    Outpatient Encounter Medications as of 06/01/2019  Medication Sig  . aspirin EC 81 MG tablet Take 81 mg by mouth daily.  Marland Kitchen buPROPion (WELLBUTRIN XL) 300 MG 24 hr tablet Take 1 tablet (300 mg total) by mouth daily.  Marland Kitchen ezetimibe (ZETIA) 10 MG tablet TAKE ONE TABLET BY MOUTH DAILY  . isosorbide mononitrate (IMDUR) 30 MG 24 hr tablet Take 0.5 tablets (15 mg total) by mouth daily.  Marland Kitchen losartan (COZAAR) 25 MG tablet Take 1 tablet (25 mg total) by mouth daily.  . methocarbamol (ROBAXIN) 500 MG tablet Take 1 tablet (500 mg total) by mouth every 6 (six) hours as needed for muscle spasms.  . metoprolol succinate (TOPROL-XL) 100 MG 24 hr tablet Take 1 tablet (100 mg total) by mouth daily.  . naphazoline-glycerin (CLEAR EYES) 0.012-0.2 % SOLN Place 1-2 drops into both eyes 4 (four) times daily as needed (for redness).  . nitroGLYCERIN (NITROSTAT) 0.4 MG SL tablet PLACE 1 TABLET (0.4 MG TOTAL) UNDER THE TONGUE EVERY 5 (FIVE) MINUTES AS NEEDED FOR CHEST PAIN.  Marland Kitchen propranolol (INDERAL) 20 MG tablet Take 1 tablet (20 mg total) by mouth as needed (As needed for fast heart rates).  . senna (SENOKOT) 8.6 MG tablet Take 2 tablets by mouth at bedtime.  . simvastatin (ZOCOR) 40 MG tablet TAKE 1 TABLET BY MOUTH EVERY DAY  . spironolactone (ALDACTONE) 25 MG tablet Take 1 tablet (25 mg total) by mouth daily.  . traZODone (DESYREL) 50 MG tablet Take 1 tablet (50 mg total) by mouth at bedtime as needed. for insomnia  . [DISCONTINUED] amoxicillin-clavulanate (AUGMENTIN) 875-125 MG tablet Take 1 tablet by mouth 2 (two) times daily. For 4 more days  . [DISCONTINUED] phenazopyridine (PYRIDIUM) 97 MG tablet Take 1 tablet (97 mg total) by mouth 2 (two) times daily as needed for pain.   No facility-administered  encounter medications on file as of 06/01/2019.     Review of Systems:  Review of Systems  Constitutional: Negative for chills, fever and malaise/fatigue.  HENT: Negative for hearing loss.        Remains hoarse  Eyes: Negative for blurred vision.  Respiratory: Negative for cough and shortness of breath.   Cardiovascular: Negative for chest pain, palpitations and leg swelling.  Gastrointestinal: Negative for abdominal pain, blood in stool and constipation.       Had dark  stools while on augmentin, but nothing since and no bright red blood  Genitourinary: Negative for dysuria, frequency and urgency.       Variable nocturia  Musculoskeletal: Negative for falls.  Skin: Negative for itching and rash.  Neurological: Negative for dizziness and loss of consciousness.  Endo/Heme/Allergies: Bruises/bleeds easily.  Psychiatric/Behavioral: Negative for depression and memory loss. The patient is nervous/anxious. The patient does not have insomnia.        Worries about his health regularly since his MI    Health Maintenance  Topic Date Due  . Hepatitis C Screening  18-Jul-1946  . TETANUS/TDAP  07/31/1964  . COLONOSCOPY  03/22/2019  . INFLUENZA VACCINE  05/06/2019  . PNA vac Low Risk Adult  Completed    Physical Exam: Vitals:   06/01/19 0904  BP: 118/60  Pulse: 62  Temp: 97.8 F (36.6 C)  TempSrc: Oral  SpO2: 97%  Weight: 210 lb (95.3 kg)  Height: 6\' 1"  (1.854 m)   Body mass index is 27.71 kg/m. Physical Exam Constitutional:      General: He is not in acute distress.    Appearance: Normal appearance. He is normal weight. He is not toxic-appearing.  HENT:     Head: Normocephalic and atraumatic.  Eyes:     Comments: glasses  Cardiovascular:     Rate and Rhythm: Normal rate and regular rhythm.     Pulses: Normal pulses.     Heart sounds: Normal heart sounds.  Pulmonary:     Effort: Pulmonary effort is normal.     Breath sounds: Normal breath sounds. No wheezing, rhonchi or  rales.  Musculoskeletal: Normal range of motion.     Right lower leg: No edema.     Left lower leg: No edema.  Skin:    General: Skin is warm and dry.     Capillary Refill: Capillary refill takes less than 2 seconds.  Neurological:     General: No focal deficit present.     Mental Status: He is alert and oriented to person, place, and time.  Psychiatric:        Mood and Affect: Mood normal.        Behavior: Behavior normal.        Thought Content: Thought content normal.        Judgment: Judgment normal.     Labs reviewed: Basic Metabolic Panel: Recent Labs    05/09/19 1008  NA 134*  K 4.4  CL 98  CO2 29  GLUCOSE 103*  BUN 14  CREATININE 1.26*  CALCIUM 9.2   Liver Function Tests: Recent Labs    05/09/19 1008  AST 15  ALT 13  BILITOT 0.9  PROT 6.4   No results for input(s): LIPASE, AMYLASE in the last 8760 hours. No results for input(s): AMMONIA in the last 8760 hours. CBC: Recent Labs    05/09/19 1008  WBC 20.2*  NEUTROABS 17,251*  HGB 13.6  HCT 40.0  MCV 90.9  PLT 221   Lipid Panel: Recent Labs    05/09/19 1008  CHOL 117  HDL 58  LDLCALC 43  TRIG 81  CHOLHDL 2.0   Lab Results  Component Value Date   HGBA1C 5.8 (H) 05/09/2019    Assessment/Plan 1. Cardiomyopathy, unspecified type ([place]) -discussed losartan use due to his concerns with covid--we discussed that he should continue at this time as studies are inconclusive about this at this time -advised he check with cardiology also on this for their opinion  2. Depression, major, single episode, in partial remission ([place]) -doing better lately with use of wellbutrin, cont same regimen   3. Paroxysmal SVT (supraventricular tachycardia) (H[facility]) -had some of this amid his prostatitis but resolved when infection resolved  4. Polyp of colon, unspecified part of colon, unspecified type Is due for cscope, but wants to postpone for a while due to covid--goes to [facility]  5. Coronary artery  disease involving native coronary artery of native heart with unstable angina pectoris ([place]) -doing well lately w/o angina -cont exercise regimen and improved diet  6. Mixed hyperlipidemia -continues on zocor and zetia for his cholesterol and at goal  7. Hyperglycemia -hba1c has been stable in prediabetic range at 5.8  8. Need for influenza vaccination -high dose given  Labs/tests ordered: no new  Next appt:  4 mos for CPE with me  Dontasia Miranda L. Virgin Zellers, D.O. [facility] [address], [place] [address] Cell Phone (Mon-Fri 8am-5pm):  [phone] On Call:  [phone] & follow prompts after 5pm & weekends Office Phone:  [phone] Office Fax:  [phone]

## 2019-06-01 NOTE — Addendum Note (Signed)
Addended by: Despina Hidden on: 06/01/2019 01:30 PM   Modules accepted: Orders

## 2019-06-08 ENCOUNTER — Telehealth: Payer: Self-pay | Admitting: Cardiovascular Disease

## 2019-06-08 NOTE — Telephone Encounter (Signed)
Virtual Visit Pre-Appointment Phone Call  "(Name), I am calling you today to discuss your upcoming appointment. We are currently trying to limit exposure to the virus that causes COVID-19 by seeing patients at home rather than in the office."  1. "What is the BEST phone number to call the day of the visit?" - include this in appointment notes  2. Do you have or have access to (through a family member/friend) a smartphone with video capability that we can use for your visit?" a. If yes - list this number in appt notes as cell (if different from BEST phone #) and list the appointment type as a VIDEO visit in appointment notes b. If no - list the appointment type as a PHONE visit in appointment notes  3. Confirm consent - "In the setting of the current Covid19 crisis, you are scheduled for a (phone or video) visit with your provider on (date) at (time).  Just as we do with many in-office visits, in order for you to participate in this visit, we must obtain consent.  If you'd like, I can send this to your mychart (if signed up) or email for you to review.  Otherwise, I can obtain your verbal consent now.  All virtual visits are billed to your insurance company just like a normal visit would be.  By agreeing to a virtual visit, we'd like you to understand that the technology does not allow for your provider to perform an examination, and thus may limit your provider's ability to fully assess your condition. If your provider identifies any concerns that need to be evaluated in person, we will make arrangements to do so.  Finally, though the technology is pretty good, we cannot assure that it will always work on either your or our end, and in the setting of a video visit, we may have to convert it to a phone-only visit.  In either situation, we cannot ensure that we have a secure connection.  Are you willing to proceed?" STAFF: Did the patient verbally acknowledge consent to telehealth visit? Document  YES/NO here: yes  4. Advise patient to be prepared - "Two hours prior to your appointment, go ahead and check your blood pressure, pulse, oxygen saturation, and your weight (if you have the equipment to check those) and write them all down. When your visit starts, your provider will ask you for this information. If you have an Apple Watch or Kardia device, please plan to have heart rate information ready on the day of your appointment. Please have a pen and paper handy nearby the day of the visit as well."  5. Give patient instructions for MyChart download to smartphone OR Doximity/Doxy.me as below if video visit (depending on what platform provider is using)  6. Inform patient they will receive a phone call 15 minutes prior to their appointment time (may be from unknown caller ID) so they should be prepared to answer    [place]. has been deemed a candidate for a follow-up tele-health visit to limit community exposure during the Covid-19 pandemic. I spoke with the patient via phone to ensure availability of phone/video source, confirm preferred email & phone number, and discuss instructions and expectations.  I reminded Mitchell Stephens. to be prepared with any vital sign and/or heart rhythm information that could potentially be obtained via home monitoring, at the time of his visit. I reminded Mitchell Stephens. to expect a phone  call prior to his visit.  Mitchell Stephens 06/08/2019 9:52 AM   INSTRUCTIONS FOR DOWNLOADING THE MYCHART APP TO SMARTPHONE  - The patient must first make sure to have activated MyChart and know their login information - If Apple, go to [employer] and type in MyChart in the search bar and download the app. If Android, ask patient to go to [employer] and type in [place] in the search bar and download the app. The app is free but as with any other app downloads, their phone may require them to verify saved payment  information or Apple/Android password.  - The patient will need to then log into the app with their MyChart username and password, and select [facility] as their healthcare provider to link the account. When it is time for your visit, go to the MyChart app, find appointments, and click Begin Video Visit. Be sure to Select Allow for your device to access the Microphone and Camera for your visit. You will then be connected, and your provider will be with you shortly.  **If they have any issues connecting, or need assistance please contact MyChart service desk [phone])**  **If using a computer, in order to ensure the best quality for their visit they will need to use either of the following Internet Browsers: [employer], or Google Chrome**  IF USING DOXIMITY or DOXY.ME - The patient will receive a link just prior to their visit by text.     FULL LENGTH CONSENT FOR TELE-HEALTH VISIT   I hereby voluntarily request, consent and authorize [facility] and its employed or contracted physicians, physician assistants, nurse practitioners or other licensed health care professionals (the Practitioner), to provide me with telemedicine health care services (the Services") as deemed necessary by the treating Practitioner. I acknowledge and consent to receive the Services by the Practitioner via telemedicine. I understand that the telemedicine visit will involve communicating with the Practitioner through live audiovisual communication technology and the disclosure of certain medical information by electronic transmission. I acknowledge that I have been given the opportunity to request an in-person assessment or other available alternative prior to the telemedicine visit and am voluntarily participating in the telemedicine visit.  I understand that I have the right to withhold or withdraw my consent to the use of telemedicine in the course of my care at any time, without affecting my right  to future care or treatment, and that the Practitioner or I may terminate the telemedicine visit at any time. I understand that I have the right to inspect all information obtained and/or recorded in the course of the telemedicine visit and may receive copies of available information for a reasonable fee.  I understand that some of the potential risks of receiving the Services via telemedicine include:   Delay or interruption in medical evaluation due to technological equipment failure or disruption;  Information transmitted may not be sufficient (e.g. poor resolution of images) to allow for appropriate medical decision making by the Practitioner; and/or   In rare instances, security protocols could fail, causing a breach of personal health information.  Furthermore, I acknowledge that it is my responsibility to provide information about my medical history, conditions and care that is complete and accurate to the best of my ability. I acknowledge that Practitioner's advice, recommendations, and/or decision may be based on factors not within their control, such as incomplete or inaccurate data provided by me or distortions of diagnostic images or specimens that may result from  electronic transmissions. I understand that the practice of medicine is not an exact science and that Practitioner makes no warranties or guarantees regarding treatment outcomes. I acknowledge that I will receive a copy of this consent concurrently upon execution via email to the email address I last provided but may also request a printed copy by calling the office of [facility].    I understand that my insurance will be billed for this visit.   I have read or had this consent read to me.  I understand the contents of this consent, which adequately explains the benefits and risks of the Services being provided via telemedicine.   I have been provided ample opportunity to ask questions regarding this consent and the Services  and have had my questions answered to my satisfaction.  I give my informed consent for the services to be provided through the use of telemedicine in my medical care  By participating in this telemedicine visit I agree to the above.

## 2019-06-09 ENCOUNTER — Inpatient Hospital Stay: Admission: RE | Admit: 2019-06-09 | Payer: Medicare HMO | Source: Ambulatory Visit

## 2019-06-16 ENCOUNTER — Telehealth: Payer: Self-pay | Admitting: *Deleted

## 2019-06-16 MED ORDER — AMOXICILLIN 500 MG PO CAPS: 2000.0000 mg | ORAL_CAPSULE | Freq: Once | ORAL | 0 refills | Status: AC

## 2019-06-16 NOTE — Telephone Encounter (Signed)
Patient called and stated that he usually gets Pre Med.  4-  500mg  Amoxicillin 1 hour prior to dental procedure.  Patient has an appointment on Monday at 11:30 with the Dentist and wants the antibiotic called to pharmacy. Please Advise.

## 2019-06-16 NOTE — Telephone Encounter (Signed)
Patient notified and agreed.  

## 2019-06-16 NOTE — Telephone Encounter (Signed)
Rx sent 

## 2019-06-20 ENCOUNTER — Ambulatory Visit
Admission: RE | Admit: 2019-06-20 | Discharge: 2019-06-20 | Disposition: A | Discharge status: Home / Self Care | Payer: Medicare HMO | Source: Ambulatory Visit | Attending: Internal Medicine | Admitting: Internal Medicine

## 2019-06-20 ENCOUNTER — Ambulatory Visit: Payer: Medicare HMO

## 2019-06-20 DIAGNOSIS — Z122 Encounter for screening for malignant neoplasm of respiratory organs: Secondary | ICD-10-CM

## 2019-06-20 DIAGNOSIS — Z87891 Personal history of nicotine dependence: Secondary | ICD-10-CM | POA: Diagnosis not present

## 2019-06-20 NOTE — Progress Notes (Signed)
Virtual Visit via Video Note   This visit type was conducted due to national recommendations for restrictions regarding the COVID-19 Pandemic (e.g. social distancing) in an effort to limit this patient's exposure and mitigate transmission in our community.  Due to his co-morbid illnesses, this patient is at least at moderate risk for complications without adequate follow up.  This format is felt to be most appropriate for this patient at this time.  All issues noted in this document were discussed and addressed.  A limited physical exam was performed with this format.  Please refer to the patient's chart for his consent to telehealth for [facility].   I connected with  Mitchell Frame. on 06/21/19 by a video enabled telemedicine application and verified that I am speaking with the correct person using two identifiers. I discussed the limitations of evaluation and management by telemedicine. The patient expressed understanding and agreed to proceed.   Evaluation Performed:  Follow-up visit  Date:  06/21/2019   ID:  Mitchell Frame., DOB Dec 19, 1945, MRN [id number]  Patient Location:  [address] [place] [address]   Provider location:   [facility], [place] office  PCP:  Gayland Curry, DO  Cardiologist:  [facility]   Chief Complaint:      History of Present Illness:    Mitchell Tubb. is a 73 y.o. male who presents via audio/video conferencing for a telehealth visit today.   The patient does not symptoms concerning for COVID-19 infection (fever, chills, cough, or new SHORTNESS OF BREATH).   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, Prior episodes of tachycardia associated with alcohol, none recently asd repair, age 48  Who presents for follow-up of his coronary artery disease, occluded RCA  On prior clinic visit blood pressure was running low, He reports blood pressure is stable and he continues to take all of his medications in the morning 115/60 at home  Couple little throat pain, with walking [place] couple miles a day  HBA1C 5.8 Non smoker 4 years,   Episodes of tachycardia Rate 60 to 100 Seen on fit bit  Mild edema Above sock line   Lab Results  Component Value Date   CHOL 117 05/09/2019   HDL 58 05/09/2019   LDLCALC 43 05/09/2019   TRIG 81 05/09/2019   ER with CP on 05/10/2018 Hospital records reviewed with the patient in detail Pain in the neck, arms Several episodes   Other past medical history reviewed stress testing 06/2017 which showed a large area of inferior apical scar without ischemia and an EF of 45%.   Diagnostic catheterization showed an occluded proximal right coronary artery with left to right collaterals and EF of 35-45%. F  Echo in December showed improvement in EF to 50% with moderate MR  exercising 6 days/week  Tach 90-100 He had mentioned these on previous office visits and previously declined event monitor  3 in march, 2 in feb episodes up to rate Sometimes lasting for several hours Does Valsalva maneuver and typically will resolve We will track the heart rate using his Fitbit  Having frequent episodes of orthostasis Dry mouth, hoarse voice  Previous episode of tachycardia, woke up in the middle of the night  Approximately August 22, 2017, called our office the  next day event monitor was offered, he declined at the time, has not had any further episodes of tachycardia Reports that he did Valsalva and arrhythmia resolved   March 2018 started developing exertional chest pain with shortness of breath Several times had to stop himself to catch his breath, weight for chest discomfort to resolve Always exertional related  Stress test  June 23, 2017 Exercise myocardial perfusion imaging study with no significant ischemia Large region of fixed defect in the inferior and apical wall consistent with previous MI. Inferior and apical wall hypokinesis.  EF estimated at 45% No EKG changes concerning for ischemia at peak stress or in recovery. Target heart rate achieved. Moderate risk scan secondary to size of old infarct.  Cardiac catheterization June 30, 2017  Prox LAD lesion, 30 %stenosed.  Prox Cx lesion, 50 %stenosed.  Prox RCA lesion, 100 %stenosed.  The left ventricular ejection fraction is 35-45% by visual estimate.     Prior CV studies:   The following studies were reviewed today:    Past Medical History:  Diagnosis Date  . Coronary artery disease    a. 06/2017 MV: EF 45%, large inf and apical defect w/o ischemia;  b. 06/2017 Cath: LM nl, LAD 30ost, LCX 50p, OM1/2 ok, RCA 100p w/ L->R collats. EF 35%.  . Depression with anxiety   . Gastric ulcer, acute with hemorrhage 2001   Dr. Sharlet Salina, [facility]  . H/O congenital atrial septal defect (ASD) repair   . Helicobacter pylori gastritis   . Heme positive stool   . History of gout   . History of peptic ulcer disease    had bleeding ulcers requiring transfusion  . History of PSVT (paroxysmal supraventricular tachycardia)   . Hyperlipidemia   . Hypertension   . Ischemic cardiomyopathy    a. 06/2017 LV gram: EF 35%.  . Obstructive sleep apnea    on CPAP  . Perennial allergic rhinitis   . Tobacco abuse    Past Surgical History:  Procedure Laterality Date  . ASD REPAIR, OSTIUM PRIMUM  1966  . 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 LEFT HEART CATH AND CORONARY ANGIOGRAPHY Left 06/30/2017   Procedure: LEFT HEART CATH AND CORONARY ANGIOGRAPHY;  Surgeon: Minna Merritts, MD;  Location: [facility];  Service: Cardiovascular;  Laterality: Left;  . open heart surgery  1966   Dr. Annamaria Boots ([facility]); was PFO repair--has been normal in  f/u  . PALATE / UVULA BIOPSY / EXCISION  2000   Dr.Sprenhe [facility])  . TONSILLECTOMY AND ADENOIDECTOMY  1959      Allergies:   Pollen extract   Social History   Tobacco Use  . Smoking status: Former Smoker    Years: 40.00    Quit date: 09/04/2015    Years since quitting: 3.7  . Smokeless tobacco: Never Used  Substance Use Topics  . Alcohol use: No    Alcohol/week: 0.0 standard drinks    Comment: previously did drink alcohol heavily  . Drug use: No    Comment: likes to avoid pain meds, no illicit drugs used     Current Outpatient Medications on File Prior to Visit  Medication Sig Dispense Refill  . aspirin EC 81 MG tablet Take 81 mg by mouth daily.    Marland Kitchen buPROPion (WELLBUTRIN XL) 300 MG 24 hr tablet Take 1 tablet (300 mg total) by mouth daily. 90 tablet 1  . ezetimibe (ZETIA) 10 MG tablet TAKE ONE TABLET BY MOUTH  DAILY 90 tablet 0  . isosorbide mononitrate (IMDUR) 30 MG 24 hr tablet Take 0.5 tablets (15 mg total) by mouth daily. 15 tablet 11  . losartan (COZAAR) 25 MG tablet Take 1 tablet (25 mg total) by mouth daily. 90 tablet 1  . metoprolol succinate (TOPROL-XL) 100 MG 24 hr tablet Take 1 tablet (100 mg total) by mouth daily. 90 tablet 0  . naphazoline-glycerin (CLEAR EYES) 0.012-0.2 % SOLN Place 1-2 drops into both eyes 4 (four) times daily as needed (for redness).    . nitroGLYCERIN (NITROSTAT) 0.4 MG SL tablet PLACE 1 TABLET (0.4 MG TOTAL) UNDER THE TONGUE EVERY 5 (FIVE) MINUTES AS NEEDED FOR CHEST PAIN. 25 tablet 1  . propranolol (INDERAL) 20 MG tablet Take 1 tablet (20 mg total) by mouth as needed (As needed for fast heart rates). 60 tablet 0  . senna (SENOKOT) 8.6 MG tablet Take 2 tablets by mouth at bedtime.    . simvastatin (ZOCOR) 40 MG tablet TAKE 1 TABLET BY MOUTH EVERY DAY 90 tablet 2  . spironolactone (ALDACTONE) 25 MG tablet Take 1 tablet (25 mg total) by mouth daily. 90 tablet 0  . traZODone (DESYREL) 50 MG tablet Take 1 tablet (50 mg total) by mouth at bedtime as  needed. for insomnia 90 tablet 1   No current facility-administered medications on file prior to visit.      Family Hx: The patient's family history includes Arthritis in his mother; Failure to thrive in his mother.  ROS:   Please see the history of present illness.    Review of Systems  Constitutional: Negative.        Rare throat pain  HENT: Negative.   Respiratory: Negative.   Cardiovascular: Positive for palpitations.       Tachycardia  Gastrointestinal: Negative.   Musculoskeletal: Negative.   Neurological: Negative.   Psychiatric/Behavioral: Negative.   All other systems reviewed and are negative.    Labs/Other Tests and Data Reviewed:    Recent Labs: 05/09/2019: ALT 13; BUN 14; Creat 1.26; Hemoglobin 13.6; Platelets 221; Potassium 4.4; Sodium 134   Recent Lipid Panel Lab Results  Component Value Date/Time   CHOL 117 05/09/2019 10:08 AM   CHOL 128 01/09/2016 08:25 AM   TRIG 81 05/09/2019 10:08 AM   HDL 58 05/09/2019 10:08 AM   HDL 43 01/09/2016 08:25 AM   CHOLHDL 2.0 05/09/2019 10:08 AM   LDLCALC 43 05/09/2019 10:08 AM    Wt Readings from Last 3 Encounters:  06/21/19 210 lb (95.3 kg)  06/01/19 210 lb (95.3 kg)  05/11/19 210 lb (95.3 kg)     Exam:    Vital Signs: Vital signs may also be detailed in the HPI BP 118/60 (BP Location: Left Arm, Patient Position: Sitting, Cuff Size: Normal) Comment: From physical 05/2019  Ht 6\' 1"  (1.854 m)   Wt 210 lb (95.3 kg)   BMI 27.71 kg/m   Wt Readings from Last 3 Encounters:  06/21/19 210 lb (95.3 kg)  06/01/19 210 lb (95.3 kg)  05/11/19 210 lb (95.3 kg)   Temp Readings from Last 3 Encounters:  06/01/19 97.8 F (36.6 C) (Oral)  05/11/19 97.8 F (36.6 C) (Oral)  05/09/19 98.7 F (37.1 C) (Oral)   BP Readings from Last 3 Encounters:  06/21/19 118/60  06/01/19 118/60  05/11/19 110/60   Pulse Readings from Last 3 Encounters:  06/01/19 62  05/11/19 62  05/09/19 89     Well nourished, well developed male  in no acute  distress. Constitutional:  oriented to person, place, and time. No distress.  Head: Normocephalic and atraumatic.  Eyes:  no discharge. No scleral icterus.  Neck: Normal range of motion. Neck supple.  Pulmonary/Chest: No audible wheezing, no distress, appears comfortable Musculoskeletal: Normal range of motion.  no  tenderness or deformity.  Neurological:   Coordination normal. Full exam not performed Skin:  No rash Psychiatric:  normal mood and affect. behavior is normal. Thought content normal.    ASSESSMENT & PLAN:    Problem List Items Addressed This Visit      Cardiology Problems   Paroxysmal SVT (supraventricular tachycardia) (HCC)   Atherosclerosis of native coronary artery of native heart with stable angina pectoris (HCC) - Primary   Cardiomyopathy ([place])   Essential hypertension, benign   Hyperlipidemia     Other   Obstructive sleep apnea on CPAP    Other Visit Diagnoses    Tobacco abuse         Tachycardia Etiology unclear, he does report prior history of nonsustained VT before he joined this practice Unable to exclude atrial flutter, fibrillation, rates low 100s sometimes up to an hour Was not captured on prior monitor early 2019, Discussed with him in detail, We will order repeat monitor even if we need to do back-to-back monitoring for up to 1 month to identify this tachycardia  CAD with stable angina Some neck pain, does not seem to be getting worse with exertion rare stable Carries nitro with him Previously seen by Peter [place]ique and felt not to be a candidate or not worth intervening on the RCA Ejection fraction had normalized  COVID-19 Education: The signs and symptoms of COVID-19 were discussed with the patient and how to seek care for testing (follow up with PCP or arrange E-visit).  The importance of social distancing was discussed today.  Patient Risk:   After full review of this patients clinical status, I feel that they are at least  moderate risk at this time.  Time:   Today, I have spent 25 minutes with the patient with telehealth technology discussing the cardiac and medical problems/diagnoses detailed above   Additional 10 min spent reviewing the chart prior to patient visit today   Medication Adjustments/Labs and Tests Ordered: Current medicines are reviewed at length with the patient today.  Concerns regarding medicines are outlined above.   Tests Ordered: No tests ordered   Medication Changes: No changes made   Disposition: Follow-up in 12 months   Signed, Ida Rogue, MD  [place] [facility] [address], [place], [place] [address]

## 2019-06-21 ENCOUNTER — Telehealth (INDEPENDENT_AMBULATORY_CARE_PROVIDER_SITE_OTHER): Payer: Medicare HMO | Admitting: Cardiovascular Disease

## 2019-06-21 ENCOUNTER — Encounter: Payer: Self-pay | Admitting: Cardiovascular Disease

## 2019-06-21 ENCOUNTER — Other Ambulatory Visit: Payer: Self-pay

## 2019-06-21 VITALS — BP 118/60 | Ht 73.0 in | Wt 210.0 lb

## 2019-06-21 DIAGNOSIS — Z9989 Dependence on other enabling machines and devices: Secondary | ICD-10-CM

## 2019-06-21 DIAGNOSIS — I10 Essential (primary) hypertension: Secondary | ICD-10-CM | POA: Diagnosis not present

## 2019-06-21 DIAGNOSIS — I25118 Atherosclerotic heart disease of native coronary artery with other forms of angina pectoris: Secondary | ICD-10-CM | POA: Diagnosis not present

## 2019-06-21 DIAGNOSIS — Z720 Tobacco use: Secondary | ICD-10-CM

## 2019-06-21 DIAGNOSIS — I471 Supraventricular tachycardia: Secondary | ICD-10-CM

## 2019-06-21 DIAGNOSIS — E782 Mixed hyperlipidemia: Secondary | ICD-10-CM

## 2019-06-21 DIAGNOSIS — G4733 Obstructive sleep apnea (adult) (pediatric): Secondary | ICD-10-CM

## 2019-06-21 DIAGNOSIS — I255 Ischemic cardiomyopathy: Secondary | ICD-10-CM

## 2019-06-21 DIAGNOSIS — I479 Paroxysmal tachycardia, unspecified: Secondary | ICD-10-CM | POA: Diagnosis not present

## 2019-06-21 MED ORDER — ISOSORBIDE MONONITRATE ER 30 MG PO TB24: 15.0000 mg | ORAL_TABLET | Freq: Every day | ORAL | 3 refills | Status: DC

## 2019-06-21 MED ORDER — METOPROLOL SUCCINATE ER 100 MG PO TB24: 100.0000 mg | ORAL_TABLET | Freq: Every day | ORAL | 3 refills | Status: DC

## 2019-06-21 NOTE — Patient Instructions (Addendum)
We will send you a Zio monitor for paroxysmal tachycardia  Refill meds 90 days with refills  Medication Instructions:  No changes  If you need a refill on your cardiac medications before your next appointment, please call your pharmacy.    Lab work: No new labs needed   If you have labs (blood work) drawn today and your tests are completely normal, you will receive your results only by: Marland Kitchen MyChart Message (if you have MyChart) OR . A paper copy in the mail If you have any lab test that is abnormal or we need to change your treatment, we will call you to review the results.   Testing/Procedures: - Your physician has recommended that you wear a Zio monitor (14-day). This monitor is a medical device that records the heart's electrical activity. Doctors most often use these monitors to diagnose arrhythmias. Arrhythmias are problems with the speed or rhythm of the heartbeat. The monitor is a small device applied to your chest. You can wear one while you do your normal daily activities. While wearing this monitor if you have any symptoms to push the button and record what you felt. Once you have worn this monitor for the period of time provider prescribed (Usually 14 days), you will return the monitor device in the postage paid box. Once it is returned they will download the data collected and provide [place] with a report which the provider will then review and we will call you with those results. Important tips:  1. Avoid showering during the first 24 hours of wearing the monitor. 2. Avoid excessive sweating to help maximize wear time. 3. Do not submerge the device, no hot tubs, and no swimming pools. 4. Keep any lotions or oils away from the patch. 5. After 24 hours you may shower with the patch on. Take brief showers with your back facing the shower head.  6. Do not remove patch once it has been placed because that will interrupt data and decrease adhesive wear time. 7. Push the button when you  have any symptoms and write down what you were feeling. 8. Once you have completed wearing your monitor, remove and place into box which has postage paid and place in your outgoing mailbox.  9. If for some reason you have misplaced your box then call our office and we can provide another box and/or mail it off for you.         Follow-Up: At [facility], you and your health needs are our priority.  As part of our continuing mission to provide you with exceptional heart care, we have created designated Provider Care Teams.  These Care Teams include your primary Cardiologist (physician) and Advanced Practice Providers (APPs -  Physician Assistants and Nurse Practitioners) who all work together to provide you with the care you need, when you need it.  . You will need a follow up appointment in 12 months (September 2021)  .   Please call our office 2 months in advance to schedule this appointment.  (Call in early July 2021 to schedule)   . Providers on your designated Care Team:   . Murray Hodgkins, NP . Christell Faith, PA-C . Marrianne Mood, PA-C  Any Other Special Instructions Will Be Listed Below (If Applicable).  For educational health videos Log in to : [email or web address] Or : [email or web address], password : [email or web address]

## 2019-06-24 ENCOUNTER — Ambulatory Visit (INDEPENDENT_AMBULATORY_CARE_PROVIDER_SITE_OTHER): Payer: Medicare HMO

## 2019-06-24 DIAGNOSIS — I479 Paroxysmal tachycardia, unspecified: Secondary | ICD-10-CM | POA: Diagnosis not present

## 2019-07-03 DIAGNOSIS — R69 Illness, unspecified: Secondary | ICD-10-CM | POA: Diagnosis not present

## 2019-07-10 ENCOUNTER — Other Ambulatory Visit: Payer: Self-pay

## 2019-07-10 ENCOUNTER — Ambulatory Visit (INDEPENDENT_AMBULATORY_CARE_PROVIDER_SITE_OTHER): Payer: Medicare HMO | Admitting: Family

## 2019-07-10 ENCOUNTER — Encounter: Payer: Self-pay | Admitting: Family

## 2019-07-10 VITALS — BP 110/62 | HR 62 | Temp 97.3°F | Ht 73.0 in | Wt 214.0 lb

## 2019-07-10 DIAGNOSIS — M5432 Sciatica, left side: Secondary | ICD-10-CM | POA: Diagnosis not present

## 2019-07-10 DIAGNOSIS — M5442 Lumbago with sciatica, left side: Secondary | ICD-10-CM

## 2019-07-10 MED ORDER — PREDNISONE 10 MG PO TABS: ORAL_TABLET | ORAL | 0 refills | Status: DC

## 2019-07-10 MED ORDER — EZETIMIBE 10 MG PO TABS: 10.0000 mg | ORAL_TABLET | Freq: Every day | ORAL | 1 refills | Status: DC

## 2019-07-10 NOTE — Progress Notes (Signed)
Provider: Tranesha Lessner FNP-C  Gayland Curry, DO  Patient Care Team: Gayland Curry, DO as PCP - General (Geriatric Medicine) [place], Peter M, MD as PCP - Cardiology (Cardiology) [name], [place] as Referring Physician (Dermatology) [name], MD as Referring Physician (Otolaryngology)  Extended Emergency Contact Information Primary Emergency Contact: [facility] Address: [address]          [place], [place] [address] [name] of [place] Phone: [phone] Mobile Phone: [phone] Relation: Spouse  Code Status:  Goals of care: Advanced Directive information Advanced Directives 09/07/2018  Does Patient Have a Medical Advance Directive? Yes  Type of Paramedic of [place];Living will  Does patient want to make changes to medical advance directive? No - Patient declined  Copy of [place] in Chart? Yes - validated most recent copy scanned in chart (See row information)  Would patient like information on creating a medical advance directive? -     Chief Complaint  Patient presents with   Acute Visit    Lower back pain to buttock on left side hip and thigh, states duration of thursday, patient also experiencing numbness in shin. Patient states he lifted a trash can of limbs 2 weeks ago and lifted the wrong way felt a pull in someting    Medication Management    Patient states he has tried alternating ibuprofen and mapap, walking, and left over methocarbamol, and tinge unit and no relief     HPI:  Pt is a 73 y.o. male seen today at [facility] office for an acute visit for evaluation of lower back pain x 10-12 days.He states Lower back pain radiates down to left buttock, hip and anterior thigh area.He also states having some numbness in shin area. He states sustained pain when he lifted a trash can of limbs 2 weeks ago and lifted the wrong way.He felt like he pulled something in the back.Pain is described  as sharp stabbing worst with prolonged sitting or lying down.He has taken Tylenol alternating ibuprofen ,walking and left over methocarbamol, and tent unit without any relief. He denies any weakness on the legs.     Past Medical History:  Diagnosis Date   Coronary artery disease    a. 06/2017 MV: EF 45%, large inf and apical defect w/o ischemia;  b. 06/2017 Cath: LM nl, LAD 30ost, LCX 50p, OM1/2 ok, RCA 100p w/ L->R collats. EF 35%.   Depression with anxiety    Gastric ulcer, acute with hemorrhage 2001   Dr. Sharlet Salina, [facility]   H/O congenital atrial septal defect (ASD) repair    Helicobacter pylori gastritis    Heme positive stool    History of gout    History of peptic ulcer disease    had bleeding ulcers requiring transfusion   History of PSVT (paroxysmal supraventricular tachycardia)    Hyperlipidemia    Hypertension    Ischemic cardiomyopathy    a. 06/2017 LV gram: EF 35%.   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 Annamaria Boots ([facility]); was PFO repair--has been normal in f/u   PALATE / UVULA BIOPSY / [facility])   TONSILLECTOMY AND ADENOIDECTOMY  1959    Allergies  Allergen Reactions   Pollen Extract Other (See Comments)    Stuffy nose/itchy throat (Seasonal allergies)    Outpatient Encounter Medications as of 07/10/2019  Medication Sig   aspirin EC 81 MG tablet Take 81 mg by mouth daily.   buPROPion (WELLBUTRIN XL) 300 MG 24 hr tablet Take 1 tablet (300 mg total) by mouth daily.   ezetimibe (ZETIA)  10 MG tablet Take 1 tablet (10 mg total) by mouth daily.   isosorbide mononitrate (IMDUR) 30 MG 24 hr tablet Take 0.5 tablets (15 mg total) by mouth daily.   losartan (COZAAR) 25 MG tablet Take 1 tablet (25 mg total) by mouth daily.   metoprolol succinate (TOPROL-XL) 100 MG 24 hr tablet Take 1 tablet (100 mg total) by mouth daily.   nitroGLYCERIN (NITROSTAT) 0.4 MG SL tablet PLACE 1 TABLET (0.4 MG TOTAL) UNDER THE TONGUE EVERY 5 (FIVE) MINUTES AS NEEDED FOR CHEST PAIN.   propranolol (INDERAL) 20 MG tablet Take 1 tablet (20 mg total) by mouth as needed (As needed for fast heart rates).   senna (SENOKOT) 8.6 MG tablet Take 2 tablets by mouth at bedtime.   simvastatin (ZOCOR) 40 MG tablet TAKE 1 TABLET BY MOUTH EVERY DAY   spironolactone (ALDACTONE) 25 MG tablet Take 1 tablet (25 mg total) by mouth daily.   traZODone (DESYREL) 50 MG tablet Take 1 tablet (50 mg total) by mouth at bedtime as needed. for insomnia   [DISCONTINUED] ezetimibe (ZETIA) 10 MG tablet TAKE ONE TABLET BY MOUTH DAILY   [DISCONTINUED] naphazoline-glycerin (CLEAR EYES) 0.012-0.2 % SOLN Place 1-2 drops into both eyes 4 (four) times daily as needed (for redness).   No facility-administered encounter medications on file as of 07/10/2019.     Review of Systems  Constitutional: Negative for appetite change, chills, fatigue and fever.  Respiratory: Negative for cough, chest tightness, shortness of breath and wheezing.   Cardiovascular: Negative for chest pain, palpitations and leg swelling.  Gastrointestinal: Negative for abdominal distention, abdominal pain, constipation, diarrhea, nausea and vomiting.  Genitourinary: Negative for difficulty urinating, dysuria, flank pain, frequency and urgency.  Musculoskeletal: Positive for arthralgias and back pain. Negative for neck pain and neck stiffness.       Left gluteal,hip and anterior area pain   Skin: Negative for color change, pallor and rash.  Neurological: Negative for  dizziness, weakness, light-headedness and headaches.       Left shin area numbness     Immunization History  Administered Date(s) Administered   Fluad Quad(high Dose 65+) 06/01/2019   Influenza, High Dose Seasonal PF 06/25/2017, 07/25/2018   Influenza,inj,Quad PF,6+ Mos 08/27/2015, 07/10/2016   Pneumococcal Conjugate-13 10/05/2013   Pneumococcal Polysaccharide-23 08/07/2016   Zoster Recombinat (Shingrix) 07/06/2017, 12/08/2017   Pertinent  Health Maintenance Due  Topic Date Due   COLONOSCOPY  03/22/2019   INFLUENZA VACCINE  Completed   PNA vac Low Risk Adult  Completed   Fall Risk  07/10/2019 06/01/2019 09/22/2018 09/07/2018 04/14/2018  Falls in the past year? 0 0 1 1 No  Number falls in past yr: 0 0 0 0 -  Injury with Fall? 0 0 0 1 -  Comment - - - L hand -  Risk for fall due to : - - - History of fall(s) -  Follow up - - - Education provided -    Vitals:   07/10/19 1449  BP: 110/62  Pulse: 62  Temp: (!) 97.3 F (36.3 C)  TempSrc: Temporal  SpO2: 98%  Weight: 214 lb (97.1 kg)  Height: 6\' 1"  (1.854 m)   Body mass index is 28.23 kg/m. Physical Exam Constitutional:      General: He is not in acute distress.    Appearance: He is not ill-appearing.  Eyes:     General: No scleral icterus.       Right eye: No discharge.        Left eye: No discharge.     Conjunctiva/sclera: Conjunctivae normal.     Pupils: Pupils are equal, round, and reactive to light.  Neck:     Musculoskeletal: Normal range of motion. No neck rigidity or muscular tenderness.     Vascular: No carotid bruit.  Cardiovascular:     Rate and Rhythm: Normal rate and regular rhythm.     Pulses: Normal pulses.     Heart sounds: Normal heart sounds. No murmur. No friction rub. No gallop.   Pulmonary:     Effort: Pulmonary effort is normal. No respiratory distress.     Breath sounds: Normal breath sounds. No wheezing, rhonchi or rales.  Chest:     Chest wall: No tenderness.  Abdominal:      General: Bowel sounds are normal. There is no distension.     Palpations: Abdomen is soft. There is no mass.     Tenderness: There is no abdominal tenderness. There is no right CVA tenderness, left CVA tenderness, guarding or rebound.  Musculoskeletal:        General: No swelling.     Left hip: He exhibits tenderness. He exhibits normal range of motion, normal strength, no swelling, no crepitus, no deformity and no laceration.     Right lower leg: No edema.     Left lower leg: No edema.       Legs:  Lymphadenopathy:     Cervical: No cervical adenopathy.  Skin:    General: Skin is warm and dry.     Coloration: Skin is not pale.     Findings: No bruising, erythema or rash.  Neurological:     Mental Status: He is alert and oriented to person, place, and time.     Cranial Nerves: No cranial nerve deficit.     Motor: No weakness.     Coordination: Coordination normal.     Gait: Gait normal.  Psychiatric:        Mood and Affect: Mood normal.        Behavior: Behavior normal.        Thought Content: Thought content normal.     Labs reviewed: Recent Labs    05/09/19 1008  NA 134*  K 4.4  CL 98  CO2 29  GLUCOSE 103*  BUN 14  CREATININE 1.26*  CALCIUM 9.2   Recent Labs    05/09/19 1008  AST 15  ALT 13  BILITOT 0.9  PROT 6.4   Recent Labs    05/09/19 1008  WBC 20.2*  NEUTROABS 17,251*  HGB 13.6  HCT 40.0  MCV 90.9  PLT 221   No results found for: TSH Lab Results  Component Value Date   HGBA1C 5.8 (H) 05/09/2019   Lab Results  Component Value Date   CHOL 117 05/09/2019   HDL 58 05/09/2019   LDLCALC 43 05/09/2019   TRIG 81 05/09/2019  CHOLHDL 2.0 05/09/2019    Significant Diagnostic Results in last 30 days:  Ct Chest Lung Ca Screen Low Dose W/o Cm  Result Date: 06/20/2019 CLINICAL DATA:  73 year old male former smoker (quit 4 years ago) with 50 pack-year history of smoking. Lung cancer screening examination. EXAM: CT CHEST WITHOUT CONTRAST LOW-DOSE FOR  LUNG CANCER SCREENING TECHNIQUE: Multidetector CT imaging of the chest was performed following the standard protocol without IV contrast. COMPARISON:  No priors. FINDINGS: Cardiovascular: Heart size is normal. There is no significant pericardial fluid, thickening or pericardial calcification. There is aortic atherosclerosis, as well as atherosclerosis of the great vessels of the mediastinum and the coronary arteries, including calcified atherosclerotic plaque in the left main, left anterior descending and right coronary arteries. Severe calcifications of the mitral annulus. Mediastinum/Nodes: No pathologically enlarged mediastinal or hilar lymph nodes. Please note that accurate exclusion of hilar adenopathy is limited on noncontrast CT scans. Esophagus is unremarkable in appearance. No axillary lymphadenopathy. Lungs/Pleura: No suspicious appearing pulmonary nodules or masses are noted. No acute consolidative airspace disease. No pleural effusions. Upper Abdomen: 3 mm nonobstructive calculus in the upper pole collecting system of the left kidney. Aortic atherosclerosis. Musculoskeletal: Median sternotomy wires. There are no aggressive appearing lytic or blastic lesions noted in the visualized portions of the skeleton. IMPRESSION: 1. Lung-RADS 1S, negative. Continue annual screening with low-dose chest CT without contrast in 12 months. 2. The "S" modifier above refers to potentially clinically significant non lung cancer related findings. Specifically, there is aortic atherosclerosis, in addition to left main and 2 vessel coronary artery disease. Please note that although the presence of coronary artery calcium documents the presence of coronary artery disease, the severity of this disease and any potential stenosis cannot be assessed on this non-gated CT examination. Assessment for potential risk factor modification, dietary therapy or pharmacologic therapy may be warranted, if clinically indicated. 3. There are  calcifications of the mitral annulus. Echocardiographic correlation for evaluation of potential valvular dysfunction may be warranted if clinically indicated. Aortic Atherosclerosis (ICD10-I70.0). Electronically Signed   By: Vinnie Langton M.D.   On: 06/20/2019 15:22    Assessment/Plan 1. Left sciatic nerve pain Elicit tenderness on left back,hip and anterior thigh area with decreased sensation to left shin area.circulation intact. - predniSONE (DELTASONE) 10 MG tablet; Take 40 mg tablet by mouth daily X 1 dose  then  Take 30 mg tablet daily x 1 dose Then take 20 mg tablet x 1 dose then  Take 10 mg tablet daily x 1 dose then  Take 5 mg tablet daily x 1 dose then stop.  Dispense: 9 tablet; Refill: 0  2. Midline low back pain with left-sided sciatica, unspecified chronicity  left low back pain with radiation to left gluteal,hip and anterior thigh area.decreased sensation to left shin area.suspect due to sciatic pain. Negative straight leg raise test.Will treat with tapered prednisone as above. Encourage to do sciatic nerve exercise online as tolerated.Fall and safety precaution.   Family/ staff Communication: Reviewed plan of care with patient.  Labs/tests ordered: None   Georgeann Brinkman C Caoilainn Sacks, NP

## 2019-07-18 ENCOUNTER — Telehealth: Payer: Self-pay | Admitting: *Deleted

## 2019-07-18 ENCOUNTER — Encounter: Payer: Self-pay | Admitting: Internal Medicine

## 2019-07-18 DIAGNOSIS — I479 Paroxysmal tachycardia, unspecified: Secondary | ICD-10-CM | POA: Diagnosis not present

## 2019-07-18 DIAGNOSIS — M5442 Lumbago with sciatica, left side: Secondary | ICD-10-CM

## 2019-07-18 DIAGNOSIS — S3992XS Unspecified injury of lower back, sequela: Secondary | ICD-10-CM

## 2019-07-18 DIAGNOSIS — M5432 Sciatica, left side: Secondary | ICD-10-CM

## 2019-07-18 NOTE — Telephone Encounter (Signed)
Dr. Mariea Clonts called and she is going to contact patient regarding concerns and place an order for MRI.

## 2019-07-18 NOTE — Telephone Encounter (Signed)
Will need a X-ray of the lumbar spine and left hip.Recommend an urgent referral to Neurologist for evaluation of left leg weakness and numbness.

## 2019-07-18 NOTE — Telephone Encounter (Signed)
He's finished off his muscle relaxers and this has now been going on for 3 weeks.  He had lifted a trash can full of limbs 3 wks ago and did feel a little click.  He had a spasm three days later at the dentist while lying on the table.  He's had sciatica before and it's always been on the other side and resolved.  He stepped off a step last night and it was numb enough that he felt he was going to fall.  He had an electrical impulse shoot through his knee.  It hurts to sleep.  It's not as bad as the first two weeks, but he is concerned about the numbness.  Numbness is in the lateral aspect of his leg.  He can feel it with his nails, but does not feel a sharp pin.  Numbness began after a week and a half.    We discussed his heart surgeries--he reports he has platinum in there.  He's had an MRI before and he does not set off airport security.  We plan on doing a lumbar spine MRI.

## 2019-07-18 NOTE — Telephone Encounter (Signed)
Patient called and stated that he was seen by Dinah on 07/10/2019 for Left Sciatic Pain. Patient stated that the pain through is Buttock to thigh is alittle better but the numbness and muscle weakness from his knee to ankle is not. Stated that his leg about gave out yesterday causing him to almost fall. Patient stated that he is doing the sciatica exercises but wants to know what the next step is. Stated that the numbness and weakness is bothering him. Please Advise.

## 2019-07-19 ENCOUNTER — Encounter: Payer: Self-pay | Admitting: Internal Medicine

## 2019-07-20 ENCOUNTER — Telehealth: Payer: Self-pay | Admitting: Cardiovascular Disease

## 2019-07-20 NOTE — Telephone Encounter (Signed)
Spoke with patient. I let him know I had uploaded his monitor results today and that the appt was from where I had to do that. He verbalized understanding and was appreciative for the information. I told him we would let him know the results when we get them.

## 2019-07-20 NOTE — Telephone Encounter (Signed)
Patient received unusual messages in [place] and thinks it may be releated to recent zio.  Please call if trying to go over results.

## 2019-07-24 ENCOUNTER — Encounter: Payer: Self-pay | Admitting: Internal Medicine

## 2019-07-24 ENCOUNTER — Telehealth: Payer: Self-pay | Admitting: Cardiovascular Disease

## 2019-07-24 NOTE — Telephone Encounter (Signed)
Message routed to Reed, Tiffany L, DO  

## 2019-07-24 NOTE — Telephone Encounter (Signed)
Attempted to call the patient. No answer- I left a message to please call back.  

## 2019-07-24 NOTE — Telephone Encounter (Signed)
Notes recorded by Minna Merritts, MD on 07/23/2019 at 1:03 PM EDT  Event monitor    Normal sinus rhythm  Patient triggered events detailing lightheadedness and chest pain were not associated with significant arrhythmia  Other triggered events detailing skipped or irregular beats was associated with ectopy or short runs SVT  --continue to take propranolol as needed    avg HR of 64 bpm.    1 run of Ventricular Tachycardia occurred lasting 6 beats with a max rate of 120 bpm (avg 109 bpm).    17 Supraventricular Tachycardia runs occurred, the run with the fastest interval lasting 15.6 secs with a max rate of 164 bpm,  the longest lasting 18.7 secs with an avg rate of 101 bpm.    Isolated SVEs were rare (<1.0%), SVE Couplets were rare (<1.0%), and SVE Triplets were rare (<1.0%).  Isolated VEs were rare (<1.0%), VE Couplets were rare (<1.0%), and no VE Triplets were present. Ventricular Bigeminy and Trigeminy were present.

## 2019-07-28 DIAGNOSIS — R69 Illness, unspecified: Secondary | ICD-10-CM | POA: Diagnosis not present

## 2019-07-28 NOTE — Telephone Encounter (Signed)
Notes recorded by Emily Filbert, RN on 07/28/2019 at 12:03 PM EDT  Patient viewed results in [place] on 10/20.  I sent a MyChart message to the patient today asking if her needed [place] to call to go over any of the results further since he never called back after we tried to reach him on 10/19.

## 2019-07-28 NOTE — Telephone Encounter (Signed)
Patient sent a MyChart message back he had reviewed results and no further questions at this time.

## 2019-08-04 ENCOUNTER — Other Ambulatory Visit: Payer: Self-pay | Admitting: *Deleted

## 2019-08-04 MED ORDER — BUPROPION HCL ER (XL) 300 MG PO TB24: 300.0000 mg | ORAL_TABLET | Freq: Every day | ORAL | 1 refills | Status: DC

## 2019-08-04 NOTE — Telephone Encounter (Signed)
Patient requested refill

## 2019-08-07 ENCOUNTER — Other Ambulatory Visit: Payer: Self-pay | Admitting: *Deleted

## 2019-08-07 MED ORDER — HYDROCHLOROTHIAZIDE 25 MG PO TABS: 25.0000 mg | ORAL_TABLET | Freq: Every day | ORAL | 1 refills | Status: DC | PRN

## 2019-08-07 MED ORDER — LOSARTAN POTASSIUM 25 MG PO TABS: 25.0000 mg | ORAL_TABLET | Freq: Every day | ORAL | 1 refills | Status: DC

## 2019-08-07 NOTE — Telephone Encounter (Signed)
Patient called requesting refill on his Losartan and HCTZ.  HCTZ is not in his current medication list. I spoke with patient and he stated that he takes HCTZ 25mg  once daily as needed for swelling in ankles. Stated that he does not take it very often at all. Wants a Rx sent to pharmacy to take as needed. Please Advise.

## 2019-08-07 NOTE — Addendum Note (Signed)
Addended by: Rafael Bihari A on: 08/07/2019 03:51 PM   Modules accepted: Orders

## 2019-08-07 NOTE — Telephone Encounter (Signed)
Medication list updated and Rx faxed to pharmacy.  Patient aware.

## 2019-08-07 NOTE — Telephone Encounter (Signed)
Ok with me 

## 2019-08-17 ENCOUNTER — Telehealth: Payer: Self-pay | Admitting: *Deleted

## 2019-08-17 NOTE — Telephone Encounter (Signed)
Medication Adhearance-Losartan Patient is taking Losartan. Patient had a refill on 08/07/2019. Patient requested.

## 2019-08-23 DIAGNOSIS — R69 Illness, unspecified: Secondary | ICD-10-CM | POA: Diagnosis not present

## 2019-08-25 IMAGING — CR DG CHEST 2V
1 series · 2 of 2 positions shown · non-contrast
Comparison: None.

CLINICAL DATA: Pre-op cardiovascular examination, abnormal nuclear
stress test

EXAM:
CHEST  2 VIEW

[Series 1: dg chest 2 view · 0.14mm/px · 2 of 2 slices shown]
[im 1/2]
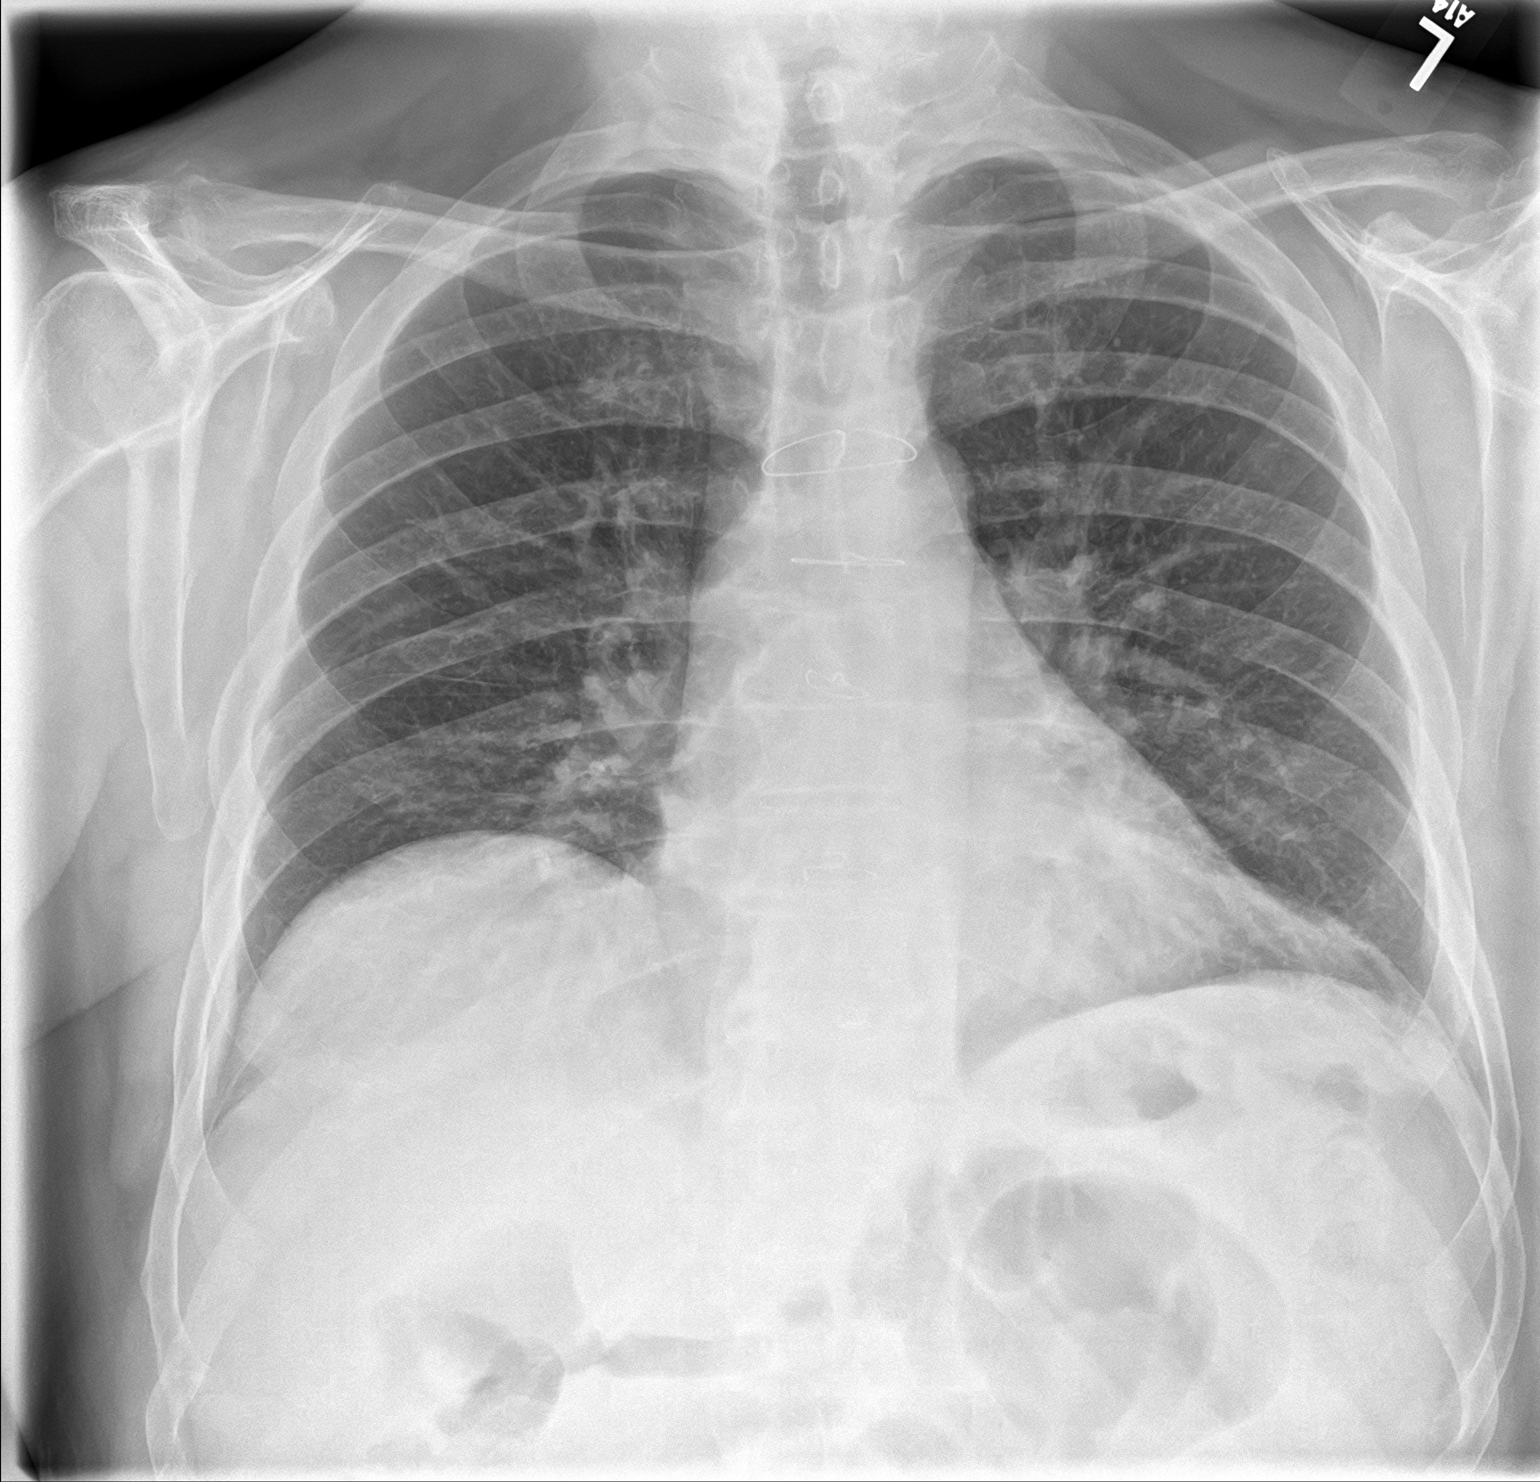
[im 2/2]
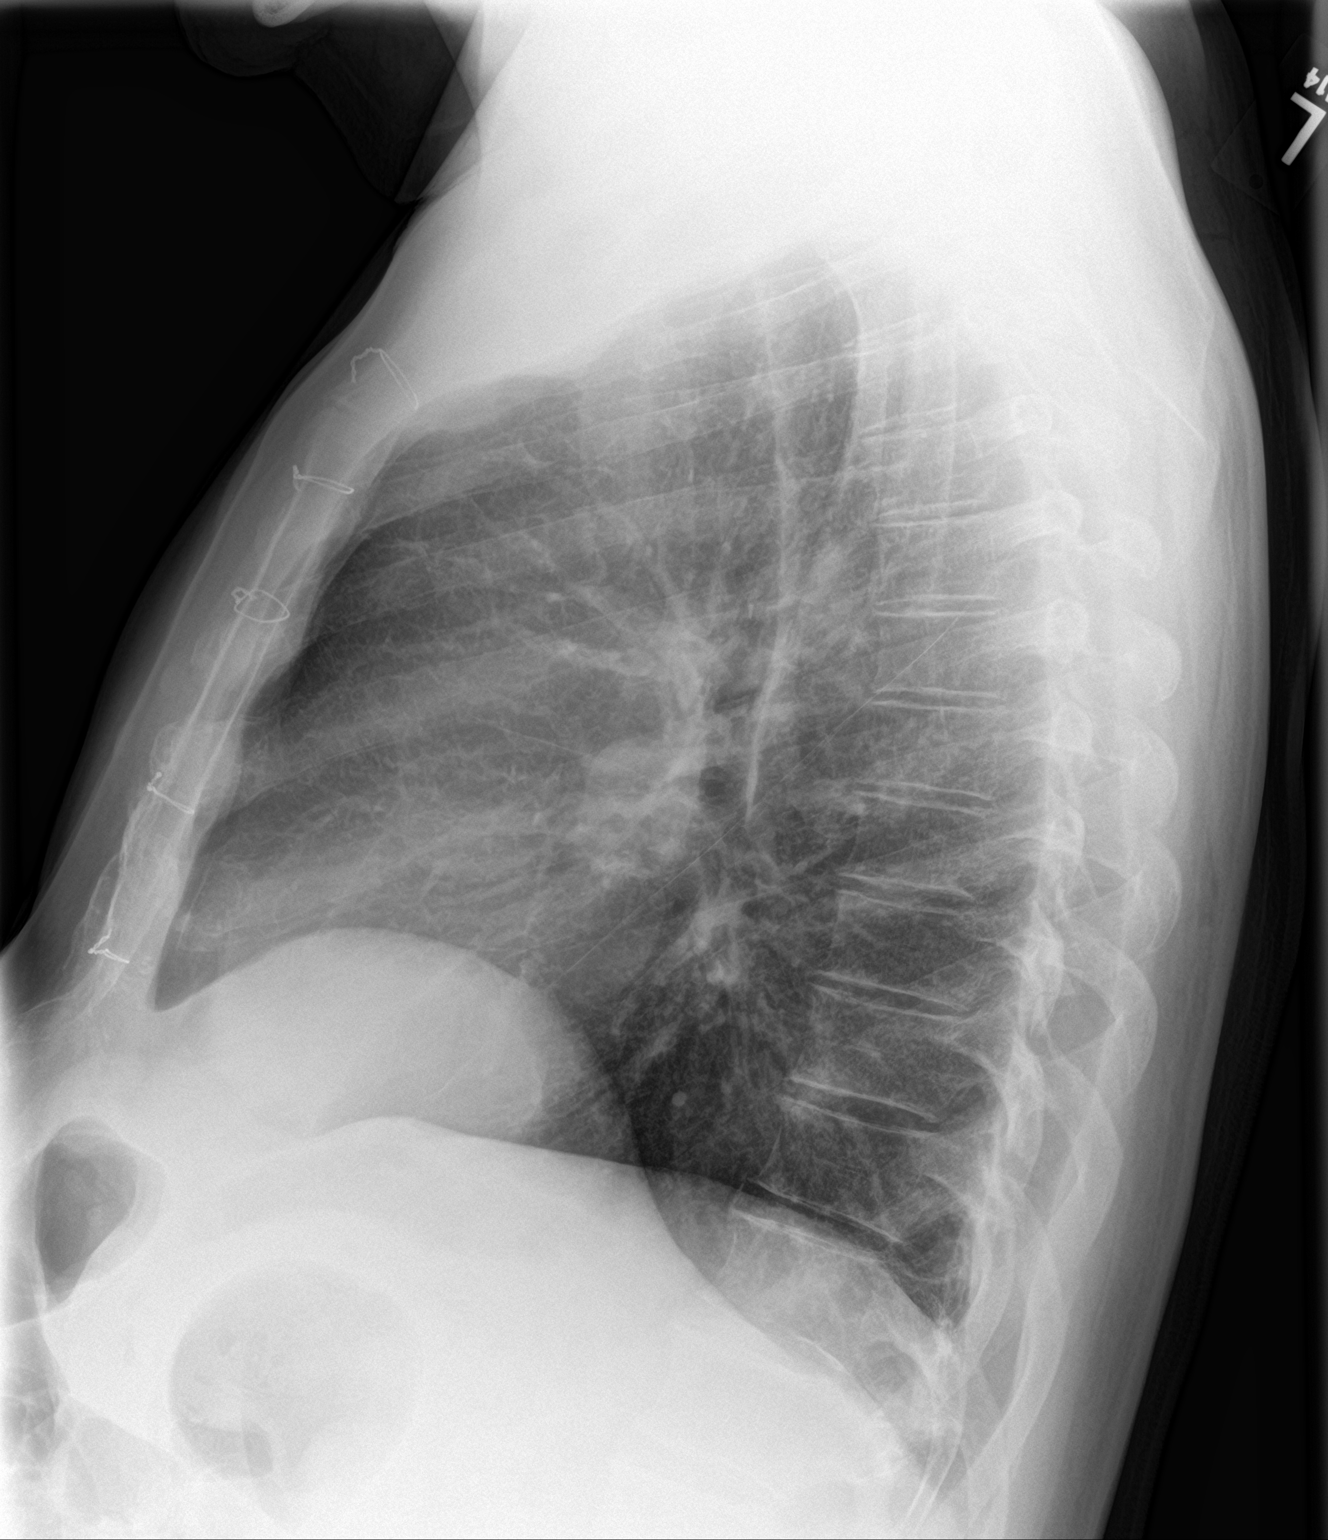

[2 of 2 positions shown; findings below may reference images not displayed]

FINDINGS: Heart size and mediastinal contours are within normal limits. Lungs
are clear. No pleural effusion or pneumothorax seen.

Degenerative spurring noted at the right AC joint. No acute or
suspicious osseous finding. Median sternotomy wires in place.
Elevation/eventration of right hemidiaphragm, of uncertain
chronicity.
IMPRESSION: No active cardiopulmonary disease. Heart size is normal. No evidence
of pneumonia or pulmonary edema.

## 2019-09-11 ENCOUNTER — Encounter: Payer: Self-pay | Admitting: Family

## 2019-09-11 ENCOUNTER — Ambulatory Visit: Payer: Self-pay

## 2019-09-12 ENCOUNTER — Encounter: Payer: Self-pay | Admitting: Family

## 2019-09-12 ENCOUNTER — Ambulatory Visit (INDEPENDENT_AMBULATORY_CARE_PROVIDER_SITE_OTHER): Payer: Medicare HMO | Admitting: Family

## 2019-09-12 ENCOUNTER — Other Ambulatory Visit: Payer: Self-pay

## 2019-09-12 DIAGNOSIS — Z0000 Encounter for general adult medical examination without abnormal findings: Secondary | ICD-10-CM

## 2019-09-12 NOTE — Progress Notes (Signed)
This service is provided via telemedicine  No vital signs collected/recorded due to the encounter was a telemedicine visit.   Location of patient (ex: home, work):  Home   Patient consents to a telephone visit:  Yes  Location of the provider (ex: office, home):  Office   Name of any referring provider:  Hollace Kinnier, D.O   Names of all persons participating in the telemedicine service and their role in the encounter:  Marlowe Sax, NP, Ruthell Rummage CMA, and patient   Time spent on call:  Ruthell Rummage CMA spent 11 minutes on phone with patient

## 2019-09-12 NOTE — Progress Notes (Signed)
Subjective:   Mitchell Stephens. is a 73 y.o. male who presents for Medicare Annual/Subsequent preventive examination.  Review of Systems:   Cardiac Risk Factors include: advanced age (>60men, >54 women);hypertension;male gender;smoking/ tobacco exposure;dyslipidemia     Objective:    Vitals: There were no vitals taken for this visit.  There is no height or weight on file to calculate BMI.  Advanced Directives 09/12/2019 09/07/2018 05/10/2018 04/14/2018 12/13/2017 08/12/2017 07/29/2017  Does Patient Have a Medical Advance Directive? Yes Yes No Yes Yes Yes Yes  Type of Paramedic of [place];Living will [place];Living will - Living will [employer];Living will [place];Living will [place]  Does patient want to make changes to medical advance directive? No - Patient declined No - Patient declined - - No - Patient declined No - Patient declined No - Patient declined  Copy of [place] in Chart? Yes - validated most recent copy scanned in chart (See row information) Yes - validated most recent copy scanned in chart (See row information) - - Yes Yes No - copy requested  Would patient like information on creating a medical advance directive? - - No - Patient declined - - - -    Tobacco Social History   Tobacco Use  Smoking Status Former Smoker  . Years: 40.00  . Quit date: 09/04/2015  . Years since quitting: 4.0  Smokeless Tobacco Never Used     Counseling given: Not Answered   Clinical Intake:  Pre-visit preparation completed: No  Pain : No/denies pain     BMI - recorded: 28.23 Nutritional Status: BMI 25 -29 Overweight Nutritional Risks: None Diabetes: No  How often do you need to have someone help you when you read instructions, pamphlets, or other written materials from your doctor or pharmacy?: 1 - Never What is the last grade level you completed in  school?: College  Interpreter Needed?: No  Information entered by :: Keinan Brouillet FNP-C  Past Medical History:  Diagnosis Date  . Coronary artery disease    a. 06/2017 MV: EF 45%, large inf and apical defect w/o ischemia;  b. 06/2017 Cath: LM nl, LAD 30ost, LCX 50p, OM1/2 ok, RCA 100p w/ L->R collats. EF 35%.  . Depression with anxiety   . Gastric ulcer, acute with hemorrhage 2001   Dr. Sharlet Salina, [facility]  . H/O congenital atrial septal defect (ASD) repair   . Helicobacter pylori gastritis   . Heme positive stool   . History of gout   . History of peptic ulcer disease    had bleeding ulcers requiring transfusion  . History of PSVT (paroxysmal supraventricular tachycardia)   . Hyperlipidemia   . Hypertension   . Ischemic cardiomyopathy    a. 06/2017 LV gram: EF 35%.  . Obstructive sleep apnea    on CPAP  . Perennial allergic rhinitis   . Tobacco abuse    Past Surgical History:  Procedure Laterality Date  . ASD REPAIR, OSTIUM PRIMUM  1966  . 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 LEFT HEART CATH AND CORONARY ANGIOGRAPHY Left 06/30/2017   Procedure: LEFT HEART CATH AND CORONARY ANGIOGRAPHY;  Surgeon: Minna Merritts, MD;  Location: [facility];  Service: Cardiovascular;  Laterality: Left;  . open heart surgery  1966   Dr. Annamaria Boots ([facility]); was PFO repair--has been normal in f/u  . PALATE / UVULA BIOPSY / EXCISION  2000   [facility])  . TONSILLECTOMY AND ADENOIDECTOMY  1959   Family History  Problem Relation Age of Onset  . Failure to thrive Mother   . Arthritis Mother    Social History   Socioeconomic History  . Marital status: Married    Spouse name: Not on file  . Number of children: Not on file  . Years of education: Not on file  . Highest education level: Not on file  Occupational History  . Not on file  Social Needs  . Financial resource strain: Not hard at all  . Food insecurity    Worry: Never true    Inability: Never true  .  Transportation needs    Medical: No    Non-medical: No  Tobacco Use  . Smoking status: Former Smoker    Years: 40.00    Quit date: 09/04/2015    Years since quitting: 4.0  . Smokeless tobacco: Never Used  Substance and Sexual Activity  . Alcohol use: No    Alcohol/week: 0.0 standard drinks    Comment: previously did drink alcohol heavily  . Drug use: No    Comment: likes to avoid pain meds, no illicit drugs used  . Sexual activity: Yes  Lifestyle  . Physical activity    Days per week: 5 days    Minutes per session: 30 min  . Stress: Only a little  Relationships  . Social Herbalist on phone: Three times a week    Gets together: Once a week    Attends religious service: More than 4 times per year    Active member of club or organization: No    Attends meetings of clubs or organizations: Never    Relationship status: Married  Other Topics Concern  . Not on file  Social History Narrative   Drinks caffeine   Married in 1973   Lives in house   Two story home, 2 live in home   [place] and does yardwork   Has living will and HCPOA    Outpatient Encounter Medications as of 09/12/2019  Medication Sig  . aspirin EC 81 MG tablet Take 81 mg by mouth daily.  Marland Kitchen buPROPion (WELLBUTRIN XL) 300 MG 24 hr tablet Take 1 tablet (300 mg total) by mouth daily.  Marland Kitchen ezetimibe (ZETIA) 10 MG tablet Take 1 tablet (10 mg total) by mouth daily.  . hydrochlorothiazide (HYDRODIURIL) 25 MG tablet Take 1 tablet (25 mg total) by mouth daily as needed.  . isosorbide mononitrate (IMDUR) 30 MG 24 hr tablet Take 0.5 tablets (15 mg total) by mouth daily.  Marland Kitchen losartan (COZAAR) 25 MG tablet Take 1 tablet (25 mg total) by mouth daily.  . metoprolol succinate (TOPROL-XL) 100 MG 24 hr tablet Take 1 tablet (100 mg total) by mouth daily.  . nitroGLYCERIN (NITROSTAT) 0.4 MG SL tablet PLACE 1 TABLET (0.4 MG TOTAL) UNDER THE TONGUE EVERY 5 (FIVE) MINUTES AS NEEDED FOR CHEST PAIN.  Marland Kitchen propranolol  (INDERAL) 20 MG tablet Take 1 tablet (20 mg total) by mouth as needed (As needed for fast heart rates).  . senna (SENOKOT) 8.6 MG tablet Take 2 tablets by mouth at bedtime.  . simvastatin (ZOCOR) 40 MG tablet TAKE 1 TABLET BY MOUTH EVERY DAY  . spironolactone (ALDACTONE) 25 MG tablet Take 1 tablet (25 mg total) by mouth daily.  . traZODone (DESYREL) 50 MG tablet Take 1 tablet (50 mg total) by mouth at bedtime as needed.  for insomnia  . [DISCONTINUED] predniSONE (DELTASONE) 10 MG tablet Take 40 mg tablet by mouth daily X 1 dose  then  Take 30 mg tablet daily x 1 dose Then take 20 mg tablet x 1 dose then  Take 10 mg tablet daily x 1 dose then  Take 5 mg tablet daily x 1 dose then stop.   No facility-administered encounter medications on file as of 09/12/2019.     Activities of Daily Living In your present state of health, do you have any difficulty performing the following activities: 09/12/2019  Hearing? N  Vision? N  Difficulty concentrating or making decisions? N  Walking or climbing stairs? N  Dressing or bathing? N  Doing errands, shopping? N  Preparing Food and eating ? N  Using the Toilet? N  In the past six months, have you accidently leaked urine? N  Do you have problems with loss of bowel control? N  Managing your Medications? N  Managing your Finances? N  Housekeeping or managing your Housekeeping? N  Some recent data might be hidden    Patient Care Team: Gayland Curry, DO as PCP - General (Geriatric Medicine) [place], Peter M, MD as PCP - Cardiology (Cardiology) [name], [place] as Referring Physician (Dermatology) [name], MD as Referring Physician (Otolaryngology)   Assessment:   This is a routine wellness examination for Amjed.  Exercise Activities and Dietary recommendations Current Exercise Habits: Home exercise routine, Type of exercise: walking, Time (Minutes): 30, Frequency (Times/Week): 6, Weekly Exercise (Minutes/Week): 180, Intensity: Moderate,  Exercise limited by: None identified  Goals    . Increase physical activity (pt-stated)     Patient will walk a couple miles a day once cardiac rehab is done.       Fall Risk Fall Risk  09/12/2019 07/10/2019 06/01/2019 09/22/2018 09/07/2018  Falls in the past year? 0 0 0 1 1  Number falls in past yr: 0 0 0 0 0  Injury with Fall? 0 0 0 0 1  Comment - - - - L hand  Risk for fall due to : - - - - History of fall(s)  Follow up - - - - Education provided   Is the patient's home free of loose throw rugs in walkways, pet beds, electrical cords, etc?   yes      Grab bars in the bathroom? no      Handrails on the stairs?   yes      Adequate lighting?   yes  Depression Screen PHQ 2/9 Scores 09/12/2019 06/01/2019 09/22/2018 09/07/2018  PHQ - 2 Score 0 0 0 0  PHQ- 9 Score - - - -    Cognitive Function MMSE - Mini Mental State Exam 09/07/2018 08/12/2017 08/07/2016 05/09/2015  Orientation to time 5 4 5 4   Orientation to Place 5 5 5 5   Registration 3 3 3 3   Attention/ Calculation 5 5 5 5   Recall 3 3 3 1   Language- name 2 objects 2 2 2 2   Language- repeat 1 1 1 1   Language- follow 3 step command 3 3 3 3   Language- read & follow direction 1 1 1 1   Write a sentence 1 1 1 1   Copy design 1 1 1 1   Total score 30 29 30 27      6CIT Screen 09/12/2019  What Year? 0 points  What month? 0 points  What time? 0 points  Count back from 20 0 points  Months in reverse 0 points  Repeat phrase 0 points  Total Score 0    Immunization History  Administered Date(s) Administered  . Fluad Quad(high Dose 65+) 06/01/2019  . Influenza, High Dose Seasonal PF 06/25/2017, 07/25/2018  . Influenza,inj,Quad PF,6+ Mos 08/27/2015, 07/10/2016  . Pneumococcal Conjugate-13 10/05/2013  . Pneumococcal Polysaccharide-23 08/07/2016  . Zoster Recombinat (Shingrix) 07/06/2017, 12/08/2017    Qualifies for Shingles Vaccine? Up to date   Screening Tests Health Maintenance  Topic Date Due  . Hepatitis C Screening   [birth year]  . COLONOSCOPY  03/22/2019  . TETANUS/TDAP  09/11/2024 (Originally 07/31/1965)  . INFLUENZA VACCINE  Completed  . PNA vac Low Risk Adult  Completed   Cancer Screenings: Lung: Low Dose CT Chest recommended if Age 85-80 years, 30 pack-year currently smoking OR have quit w/in 15years. Patient does qualify. Colorectal:Would like to wait until he gets a COVID-19 vaccine.  Additional Screenings: Hepatitis C Screening: Low Risk    Plan:  - Would like Colonoscopy postponed until he gets COVID-19 vaccine  - Declined Tdap vaccine  - Hepatitis C postponed   I have personally reviewed and noted the following in the patient's chart:   . Medical and social history . Use of alcohol, tobacco or illicit drugs  . Current medications and supplements . Functional ability and status . Nutritional status . Physical activity . Advanced directives . List of other physicians . Hospitalizations, surgeries, and ER visits in previous 12 months . Vitals . Screenings to include cognitive, depression, and falls . Referrals and appointments  In addition, I have reviewed and discussed with patient certain preventive protocols, quality metrics, and best practice recommendations. A written personalized care plan for preventive services as well as general preventive health recommendations were provided to patient.   Sandrea Hughs, NP  09/12/2019

## 2019-09-12 NOTE — Patient Instructions (Signed)
Mitchell Stephens , Thank you for taking time to come for your Medicare Wellness Visit. I appreciate your ongoing commitment to your health goals. Please review the following plan we discussed and let me know if I can assist you in the future.   Screening recommendations/referrals: Colonoscopy: Due 2020  Recommended yearly ophthalmology/optometry visit for glaucoma screening and checkup Recommended yearly dental visit for hygiene and checkup  Vaccinations: Influenza vaccine: Up to date  Pneumococcal vaccine: Up to date  Tdap vaccine: Due 2020  Shingles vaccine: : Up to date   Advanced directives: Yes   Conditions/risks identified: Advance age male > 74 yrs,Male Gender,Hypertension,dyslipidemia,Hx of smoking   Next appointment: 1 year   Preventive Care 74 Years and Older, Male Preventive care refers to lifestyle choices and visits with your health care provider that can promote health and wellness. What does preventive care include?  A yearly physical exam. This is also called an annual well check.  Dental exams once or twice a year.  Routine eye exams. Ask your health care provider how often you should have your eyes checked.  Personal lifestyle choices, including:  Daily care of your teeth and gums.  Regular physical activity.  Eating a healthy diet.  Avoiding tobacco and drug use.  Limiting alcohol use.  Practicing safe sex.  Taking low doses of aspirin every day.  Taking vitamin and mineral supplements as recommended by your health care provider. What happens during an annual well check? The services and screenings done by your health care provider during your annual well check will depend on your age, overall health, lifestyle risk factors, and family history of disease. Counseling  Your health care provider may ask you questions about your:  Alcohol use.  Tobacco use.  Drug use.  Emotional well-being.  Home and relationship well-being.  Sexual  activity.  Eating habits.  History of falls.  Memory and ability to understand (cognition).  Work and work Statistician. Screening  You may have the following tests or measurements:  Height, weight, and BMI.  Blood pressure.  Lipid and cholesterol levels. These may be checked every 5 years, or more frequently if you are over 66 years old.  Skin check.  Lung cancer screening. You may have this screening every year starting at age 74 if you have a 30-pack-year history of smoking and currently smoke or have quit within the past 15 years.  Fecal occult blood test (FOBT) of the stool. You may have this test every year starting at age 74.  Flexible sigmoidoscopy or colonoscopy. You may have a sigmoidoscopy every 5 years or a colonoscopy every 10 years starting at age 45.  Prostate cancer screening. Recommendations will vary depending on your family history and other risks.  Hepatitis C blood test.  Hepatitis B blood test.  Sexually transmitted disease (STD) testing.  Diabetes screening. This is done by checking your blood sugar (glucose) after you have not eaten for a while (fasting). You may have this done every 1-3 years.  Abdominal aortic aneurysm (AAA) screening. You may need this if you are a current or former smoker.  Osteoporosis. You may be screened starting at age 74 if you are at high risk. Talk with your health care provider about your test results, treatment options, and if necessary, the need for more tests. Vaccines  Your health care provider may recommend certain vaccines, such as:  Influenza vaccine. This is recommended every year.  Tetanus, diphtheria, and acellular pertussis (Tdap, Td) vaccine. You may need  a Td booster every 10 years.  Zoster vaccine. You may need this after age 74.  Pneumococcal 13-valent conjugate (PCV13) vaccine. One dose is recommended after age 74.  Pneumococcal polysaccharide (PPSV23) vaccine. One dose is recommended after age 74.  Talk to your health care provider about which screenings and vaccines you need and how often you need them. This information is not intended to replace advice given to you by your health care provider. Make sure you discuss any questions you have with your health care provider. Document Released: 10/18/2015 Document Revised: 06/10/2016 Document Reviewed: 07/23/2015 Elsevier Interactive Patient Education  2017 Ages Prevention in the Home Falls can cause injuries. They can happen to people of all ages. There are many things you can do to make your home safe and to help prevent falls. What can I do on the outside of my home?  Regularly fix the edges of walkways and driveways and fix any cracks.  Remove anything that might make you trip as you walk through a door, such as a raised step or threshold.  Trim any bushes or trees on the path to your home.  Use bright outdoor lighting.  Clear any walking paths of anything that might make someone trip, such as rocks or tools.  Regularly check to see if handrails are loose or broken. Make sure that both sides of any steps have handrails.  Any raised decks and porches should have guardrails on the edges.  Have any leaves, snow, or ice cleared regularly.  Use sand or salt on walking paths during winter.  Clean up any spills in your garage right away. This includes oil or grease spills. What can I do in the bathroom?  Use night lights.  Install grab bars by the toilet and in the tub and shower. Do not use towel bars as grab bars.  Use non-skid mats or decals in the tub or shower.  If you need to sit down in the shower, use a plastic, non-slip stool.  Keep the floor dry. Clean up any water that spills on the floor as soon as it happens.  Remove soap buildup in the tub or shower regularly.  Attach bath mats securely with double-sided non-slip rug tape.  Do not have throw rugs and other things on the floor that can make you  trip. What can I do in the bedroom?  Use night lights.  Make sure that you have a light by your bed that is easy to reach.  Do not use any sheets or blankets that are too big for your bed. They should not hang down onto the floor.  Have a firm chair that has side arms. You can use this for support while you get dressed.  Do not have throw rugs and other things on the floor that can make you trip. What can I do in the kitchen?  Clean up any spills right away.  Avoid walking on wet floors.  Keep items that you use a lot in easy-to-reach places.  If you need to reach something above you, use a strong step stool that has a grab bar.  Keep electrical cords out of the way.  Do not use floor polish or wax that makes floors slippery. If you must use wax, use non-skid floor wax.  Do not have throw rugs and other things on the floor that can make you trip. What can I do with my stairs?  Do not leave any items on the  stairs.  Make sure that there are handrails on both sides of the stairs and use them. Fix handrails that are broken or loose. Make sure that handrails are as long as the stairways.  Check any carpeting to make sure that it is firmly attached to the stairs. Fix any carpet that is loose or worn.  Avoid having throw rugs at the top or bottom of the stairs. If you do have throw rugs, attach them to the floor with carpet tape.  Make sure that you have a light switch at the top of the stairs and the bottom of the stairs. If you do not have them, ask someone to add them for you. What else can I do to help prevent falls?  Wear shoes that:  Do not have high heels.  Have rubber bottoms.  Are comfortable and fit you well.  Are closed at the toe. Do not wear sandals.  If you use a stepladder:  Make sure that it is fully opened. Do not climb a closed stepladder.  Make sure that both sides of the stepladder are locked into place.  Ask someone to hold it for you, if  possible.  Clearly mark and make sure that you can see:  Any grab bars or handrails.  First and last steps.  Where the edge of each step is.  Use tools that help you move around (mobility aids) if they are needed. These include:  Canes.  Walkers.  Scooters.  Crutches.  Turn on the lights when you go into a dark area. Replace any light bulbs as soon as they burn out.  Set up your furniture so you have a clear path. Avoid moving your furniture around.  If any of your floors are uneven, fix them.  If there are any pets around you, be aware of where they are.  Review your medicines with your doctor. Some medicines can make you feel dizzy. This can increase your chance of falling. Ask your doctor what other things that you can do to help prevent falls. This information is not intended to replace advice given to you by your health care provider. Make sure you discuss any questions you have with your health care provider. Document Released: 07/18/2009 Document Revised: 02/27/2016 Document Reviewed: 10/26/2014 Elsevier Interactive Patient Education  2017 [employer].

## 2019-09-19 ENCOUNTER — Encounter: Payer: Self-pay | Admitting: Internal Medicine

## 2019-09-19 DIAGNOSIS — R69 Illness, unspecified: Secondary | ICD-10-CM | POA: Diagnosis not present

## 2019-09-21 DIAGNOSIS — G4733 Obstructive sleep apnea (adult) (pediatric): Secondary | ICD-10-CM | POA: Diagnosis not present

## 2019-09-25 ENCOUNTER — Other Ambulatory Visit: Payer: Self-pay

## 2019-09-25 ENCOUNTER — Encounter: Payer: Self-pay | Admitting: Internal Medicine

## 2019-09-25 ENCOUNTER — Ambulatory Visit (INDEPENDENT_AMBULATORY_CARE_PROVIDER_SITE_OTHER): Payer: Medicare HMO | Admitting: Internal Medicine

## 2019-09-25 VITALS — BP 110/64 | HR 62 | Wt 211.0 lb

## 2019-09-25 DIAGNOSIS — M5442 Lumbago with sciatica, left side: Secondary | ICD-10-CM

## 2019-09-25 DIAGNOSIS — E782 Mixed hyperlipidemia: Secondary | ICD-10-CM | POA: Diagnosis not present

## 2019-09-25 DIAGNOSIS — R739 Hyperglycemia, unspecified: Secondary | ICD-10-CM

## 2019-09-25 DIAGNOSIS — G8929 Other chronic pain: Secondary | ICD-10-CM | POA: Diagnosis not present

## 2019-09-25 DIAGNOSIS — I471 Supraventricular tachycardia, unspecified: Secondary | ICD-10-CM

## 2019-09-25 DIAGNOSIS — I429 Cardiomyopathy, unspecified: Secondary | ICD-10-CM | POA: Diagnosis not present

## 2019-09-25 DIAGNOSIS — I25110 Atherosclerotic heart disease of native coronary artery with unstable angina pectoris: Secondary | ICD-10-CM | POA: Diagnosis not present

## 2019-09-25 DIAGNOSIS — I10 Essential (primary) hypertension: Secondary | ICD-10-CM | POA: Diagnosis not present

## 2019-09-25 NOTE — Progress Notes (Signed)
Patient ID: Mitchell Stephens., male   DOB: 02-25-1946, 73 y.o.   MRN: [id number] This service is provided via telemedicine  No vital signs collected/recorded due to the encounter was a telemedicine visit.   Location of patient (ex: home, work):  HOME  Patient consents to a telephone visit:  YES  Location of the provider (ex: office, home):  OFFICE  Name of any referring provider:  DR [facility] Adalyna Godbee, DO  Names of all persons participating in the telemedicine service and their role in the encounter:  PATIENT, Edwin Dada, [place], [place], DO  Time spent on call:  3:50  Virtual Visit via Video Note  I connected with [name] on 09/25/19 at  1:00 PM EST by a video enabled telemedicine application and verified that I am speaking with the correct person using two identifiers.  Location: Patient: home Provider: [facility] office   I discussed the limitations of evaluation and management by telemedicine and the availability of in person appointments. The patient expressed understanding and agreed to proceed.  History of Present Illness: He had a bad day last Thursday--had a dental appt at [facility] and had to go up 8 flights.  Had to stop at level 3 to catch his breath.  Saturday, he had a spell of angina that lasted all day--took two ntgs 5 mins apart.  He worried, but he didn't.  It was not a hard pain like he had that time when NP saw him in the summer and it was an MI.  It was central chest and not down his arm.  It was a throb in his chest.  He did not call 911.  His bp and heart rate were ok.  His Jackelyn Poling "looked ok to him".  His Sunday, he stayed in bed until noon.  He's been walking and been fine.     He's had to wear a monitor since.  20-30 seconds long episodes had been going on of tachycardia.  None in 3 weeks.  It took him a long time to get back to walking after his back was hurting.  Has intermittent nerve compression.  It's 90-95% better than it had been.    [place].   He is attending his virtual meetings--had one this am.  His classification is now 1c.    No more urinary symptoms.  Observations/Objective: VS:  122/72 68  Repeated 110/64 62 resting HR on avg on fitbit is 62 for the month Wt 207 nude and 211 dressed  Assessment and Plan: 1. Chronic midline low back pain with left-sided sciatica -now intermittent sciatica -is back to walking though due to near resolution  2. Coronary artery disease involving native coronary artery of native heart with unstable angina pectoris ([place]) -counseled about importance of seeking medical attention, at least calling his providers, if he has chest pain that does not resolve with NTG   3. Essential hypertension, benign -bp well controlled with current therapy  4. Cardiomyopathy, unspecified type ([place]) -monitored by cardiology  5. Paroxysmal SVT (supraventricular tachycardia) ([facility]) -uses home monitoring system--none of this for 3 wks   Prostatitis resolved  6.  Hyperglycemia:  F/u hba1c; get back on track with walking to help with weight loss  7.  Hyperlipidemia:  F/u flp before next visit; get back on track with walking to help with weight loss  Follow Up Instructions: Continue back at walking.  Please call us or your cardiologist if that chest pain  you had recurs and does not respond to NTG.     I discussed the assessment and treatment plan with the patient. The patient was provided an opportunity to ask questions and all were answered. The patient agreed with the plan and demonstrated an understanding of the instructions.   The patient was advised to call back or seek an in-person evaluation if the symptoms worsen or if the condition fails to improve as anticipated.  I provided 27 minutes of non-face-to-face time during this encounter.  F/u in 6 mos for CPE, fasting labs before--he may call to move this up if covid gets under control sooner and he feels safe to  come in.  Nilton Lave L. Tagg Eustice, D.O. [facility] [address], [place] [address] Cell Phone (Mon-Fri 8am-5pm):  [phone] On Call:  [phone] & follow prompts after 5pm & weekends Office Phone:  [phone] Office Fax:  [phone]

## 2019-10-04 ENCOUNTER — Other Ambulatory Visit: Payer: Self-pay | Admitting: *Deleted

## 2019-10-04 MED ORDER — SPIRONOLACTONE 25 MG PO TABS: 25.0000 mg | ORAL_TABLET | Freq: Every day | ORAL | 0 refills | Status: DC

## 2019-10-04 NOTE — Telephone Encounter (Signed)
Patient requested refill Pended and sent to Dr. Lyndel Safe for approval due to [place]. Dr. Mariea Clonts out of office.

## 2019-10-05 ENCOUNTER — Other Ambulatory Visit: Payer: Self-pay | Admitting: Internal Medicine

## 2019-10-22 DIAGNOSIS — G4733 Obstructive sleep apnea (adult) (pediatric): Secondary | ICD-10-CM | POA: Diagnosis not present

## 2019-11-13 DIAGNOSIS — R69 Illness, unspecified: Secondary | ICD-10-CM | POA: Diagnosis not present

## 2019-11-22 DIAGNOSIS — G4733 Obstructive sleep apnea (adult) (pediatric): Secondary | ICD-10-CM | POA: Diagnosis not present

## 2019-12-18 ENCOUNTER — Telehealth: Payer: Self-pay | Admitting: Cardiovascular Disease

## 2019-12-18 ENCOUNTER — Other Ambulatory Visit: Payer: Self-pay | Admitting: *Deleted

## 2019-12-18 MED ORDER — TRAZODONE HCL 50 MG PO TABS: 50.0000 mg | ORAL_TABLET | Freq: Every evening | ORAL | 1 refills | Status: DC | PRN

## 2019-12-18 MED ORDER — PROPRANOLOL HCL 20 MG PO TABS: 20.0000 mg | ORAL_TABLET | ORAL | 0 refills | Status: DC | PRN

## 2019-12-18 MED ORDER — SPIRONOLACTONE 25 MG PO TABS: 25.0000 mg | ORAL_TABLET | Freq: Every day | ORAL | 1 refills | Status: DC

## 2019-12-18 NOTE — Telephone Encounter (Signed)
Spoke with patient and reviewed that once he sees GI doctor they will send [place] a clearance for Dr. Rockey Situ to review. He verbalized understanding and had no further questions.

## 2019-12-18 NOTE — Telephone Encounter (Signed)
Patient thinking about doing colonoscopy screening and wants to know given hx of MI if safe for procedure and anesthesia

## 2019-12-18 NOTE — Telephone Encounter (Signed)
Patient requested refills.  Pended Rx and sent to Dr. Mariea Clonts for approval due to [place].

## 2019-12-20 NOTE — Telephone Encounter (Signed)
Spoke with patient and reviewed provider felt it would be ok for him to proceed with his procedure. He was appreciative for the call with no further questions at this time.

## 2019-12-20 NOTE — Telephone Encounter (Signed)
Should be okay to have colonoscopy

## 2019-12-22 DIAGNOSIS — S025XXA Fracture of tooth (traumatic), initial encounter for closed fracture: Secondary | ICD-10-CM | POA: Diagnosis not present

## 2019-12-22 DIAGNOSIS — R69 Illness, unspecified: Secondary | ICD-10-CM | POA: Diagnosis not present

## 2019-12-27 DIAGNOSIS — H2513 Age-related nuclear cataract, bilateral: Secondary | ICD-10-CM | POA: Diagnosis not present

## 2019-12-28 ENCOUNTER — Other Ambulatory Visit: Payer: Self-pay | Admitting: Internal Medicine

## 2020-01-01 ENCOUNTER — Other Ambulatory Visit: Payer: Self-pay | Admitting: *Deleted

## 2020-01-01 MED ORDER — EZETIMIBE 10 MG PO TABS: 10.0000 mg | ORAL_TABLET | Freq: Every day | ORAL | 1 refills | Status: DC

## 2020-01-01 MED ORDER — SIMVASTATIN 40 MG PO TABS: 40.0000 mg | ORAL_TABLET | Freq: Every day | ORAL | 2 refills | Status: DC

## 2020-01-01 NOTE — Telephone Encounter (Signed)
Patient requested refills.  Zetia to [place].

## 2020-01-06 ENCOUNTER — Other Ambulatory Visit: Payer: Self-pay | Admitting: Cardiovascular Disease

## 2020-01-09 ENCOUNTER — Other Ambulatory Visit: Payer: Self-pay | Admitting: Cardiovascular Disease

## 2020-01-29 ENCOUNTER — Ambulatory Visit: Payer: Medicare HMO | Admitting: Dermatology

## 2020-01-29 ENCOUNTER — Encounter: Payer: Self-pay | Admitting: Dermatology

## 2020-01-29 ENCOUNTER — Other Ambulatory Visit: Payer: Self-pay

## 2020-01-29 DIAGNOSIS — Z1283 Encounter for screening for malignant neoplasm of skin: Secondary | ICD-10-CM

## 2020-01-29 DIAGNOSIS — L820 Inflamed seborrheic keratosis: Secondary | ICD-10-CM | POA: Diagnosis not present

## 2020-01-29 DIAGNOSIS — D485 Neoplasm of uncertain behavior of skin: Secondary | ICD-10-CM

## 2020-01-29 DIAGNOSIS — L578 Other skin changes due to chronic exposure to nonionizing radiation: Secondary | ICD-10-CM | POA: Diagnosis not present

## 2020-01-29 DIAGNOSIS — R21 Rash and other nonspecific skin eruption: Secondary | ICD-10-CM | POA: Diagnosis not present

## 2020-01-29 DIAGNOSIS — L570 Actinic keratosis: Secondary | ICD-10-CM | POA: Diagnosis not present

## 2020-01-29 DIAGNOSIS — Z85828 Personal history of other malignant neoplasm of skin: Secondary | ICD-10-CM

## 2020-01-29 DIAGNOSIS — C4492 Squamous cell carcinoma of skin, unspecified: Secondary | ICD-10-CM

## 2020-01-29 DIAGNOSIS — D045 Carcinoma in situ of skin of trunk: Secondary | ICD-10-CM | POA: Diagnosis not present

## 2020-01-29 HISTORY — DX: Squamous cell carcinoma of skin, unspecified: C44.92

## 2020-01-29 NOTE — Progress Notes (Addendum)
Follow-Up Visit   Subjective  Mitchell Stephens. is a 74 y.o. male who presents for the following: Annual Exam (History SCC ) and Other (Spot of abdomen that gets crusty).  Patient presents for total body skin examination for skin cancer screening and mole check.  The following portions of the chart were reviewed this encounter and updated as appropriate:  Tobacco  Allergies  Meds  Problems  Med Hx  Surg Hx  Fam Hx      Review of Systems:  No other skin or systemic complaints except as noted in HPI or Assessment and Plan.  Objective  Well appearing patient in no apparent distress; mood and affect are within normal limits.  A full examination was performed including scalp, head, eyes, ears, nose, lips, neck, chest, axillae, abdomen, back, buttocks, bilateral upper extremities, bilateral lower extremities, hands, feet, fingers, toes, fingernails, and toenails. All findings within normal limits unless otherwise noted below.  Objective  Face (16): Erythematous thin papules/macules with gritty scale.   Objective  Multiple: Well healed scar with no evidence of recurrence, no lymphadenopathy.   Objective  arms,hands (16): Erythematous keratotic or waxy stuck-on papule or plaque.   Objective  Right clavicle: 1.0 cm crusted patch.  Objective  Epigastric, Left pretibial (2): Scaly pink patches.   Assessment & Plan    Skin cancer screening performed today.  Actinic Damage - diffuse scaly erythematous macules with underlying dyspigmentation - Recommend daily broad spectrum sunscreen SPF 30+ to sun-exposed areas, reapply every 2 hours as needed.  - Call for new or changing lesions.   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  Inflamed seborrheic keratosis (16) arms,hands  Destruction of lesion - arms,hands Complexity: simple   Destruction method: cryotherapy   Informed consent: discussed and consent obtained   Timeout:  patient name, date of birth, surgical site, and procedure verified Lesion destroyed using liquid nitrogen: Yes   Region frozen until ice ball extended beyond lesion: Yes   Outcome: patient tolerated procedure well with no complications   Post-procedure details: wound care instructions given    Neoplasm of uncertain behavior of skin Right clavicle  Skin / nail biopsy Type of biopsy: tangential   Informed consent: discussed and consent obtained   Timeout: patient name, date of birth, surgical site, and procedure verified   Procedure prep:  Patient was prepped and draped in usual sterile fashion Prep type:  Isopropyl alcohol Anesthesia: the lesion was anesthetized in a standard fashion   Anesthetic:  1% lidocaine w/ epinephrine 1-100,000 buffered w/ 8.4% NaHCO3 Instrument used: flexible razor blade   Hemostasis achieved with: pressure, aluminum chloride and electrodesiccation   Outcome: patient tolerated procedure well   Post-procedure details: sterile dressing applied and wound care instructions given   Dressing type: bandage and petrolatum    Specimen 1 - Surgical pathology Differential Diagnosis: SCC vs other Check Margins: No 1.0 cm crusted patch.   Rash (3) Left pretibial (2); Epigastric  Will recheck on follow up. May plan biopsy pending biopsy  of right clavicle.  Skin cancer screening  Return in about 3 months (around 04/29/2020).    Documentation: I have reviewed the above documentation for accuracy and  completeness, and I agree with the above.  Sarina Ser, MD

## 2020-01-29 NOTE — Patient Instructions (Signed)

## 2020-01-30 ENCOUNTER — Encounter: Payer: Self-pay | Admitting: Dermatology

## 2020-01-31 ENCOUNTER — Telehealth: Payer: Self-pay

## 2020-01-31 NOTE — Telephone Encounter (Signed)
Called pt discussed biopsy results, we will treat SCCis at follow up appt scheduled in August

## 2020-02-12 ENCOUNTER — Other Ambulatory Visit: Payer: Self-pay | Admitting: Internal Medicine

## 2020-02-13 NOTE — Addendum Note (Signed)
Addended by: Ashok Cordia B on: 02/13/2020 12:00 PM   Modules accepted: Orders

## 2020-02-21 DIAGNOSIS — S025XXA Fracture of tooth (traumatic), initial encounter for closed fracture: Secondary | ICD-10-CM | POA: Diagnosis not present

## 2020-03-01 NOTE — Telephone Encounter (Signed)
This encounter was created in error - please disregard.

## 2020-03-06 ENCOUNTER — Other Ambulatory Visit: Payer: Self-pay | Admitting: *Deleted

## 2020-03-06 MED ORDER — HYDROCHLOROTHIAZIDE 25 MG PO TABS: 25.0000 mg | ORAL_TABLET | Freq: Every day | ORAL | 1 refills | Status: DC | PRN

## 2020-03-06 NOTE — Telephone Encounter (Signed)
Patient requested refill

## 2020-03-26 ENCOUNTER — Other Ambulatory Visit: Payer: Self-pay | Admitting: *Deleted

## 2020-03-26 MED ORDER — SPIRONOLACTONE 25 MG PO TABS: 25.0000 mg | ORAL_TABLET | Freq: Every day | ORAL | 1 refills | Status: DC

## 2020-03-26 NOTE — Telephone Encounter (Signed)
Patient requested refill Pended Rx and sent to Dr. Reed for approval due to HIGH ALERT Warning.   

## 2020-03-28 ENCOUNTER — Encounter: Payer: Self-pay | Admitting: *Deleted

## 2020-03-28 NOTE — Telephone Encounter (Signed)
error 

## 2020-04-01 ENCOUNTER — Other Ambulatory Visit: Payer: Self-pay

## 2020-04-01 ENCOUNTER — Other Ambulatory Visit: Payer: Self-pay | Admitting: Cardiovascular Disease

## 2020-04-01 ENCOUNTER — Other Ambulatory Visit: Payer: Medicare HMO

## 2020-04-01 DIAGNOSIS — I429 Cardiomyopathy, unspecified: Secondary | ICD-10-CM | POA: Diagnosis not present

## 2020-04-01 DIAGNOSIS — I471 Supraventricular tachycardia: Secondary | ICD-10-CM | POA: Diagnosis not present

## 2020-04-01 DIAGNOSIS — E782 Mixed hyperlipidemia: Secondary | ICD-10-CM

## 2020-04-01 DIAGNOSIS — R739 Hyperglycemia, unspecified: Secondary | ICD-10-CM

## 2020-04-02 LAB — COMPLETE METABOLIC PANEL WITH GFR
AG Ratio: 1.9 (calc) (ref 1.0–2.5)
ALT: 13 U/L (ref 9–46)
AST: 14 U/L (ref 10–35)
Albumin: 3.9 g/dL (ref 3.6–5.1)
Alkaline phosphatase (APISO): 38 U/L (ref 35–144)
BUN/Creatinine Ratio: 17 (calc) (ref 6–22)
BUN: 22 mg/dL (ref 7–25)
CO2: 26 mmol/L (ref 20–32)
Calcium: 9.3 mg/dL (ref 8.6–10.3)
Chloride: 102 mmol/L (ref 98–110)
Creat: 1.33 mg/dL — ABNORMAL HIGH (ref 0.70–1.18)
GFR, Est African American: 61 mL/min/{1.73_m2} (ref >=60)
GFR, Est Non African American: 52 mL/min/{1.73_m2} — ABNORMAL LOW (ref >=60)
Globulin: 2.1 g/dL (calc) (ref 1.9–3.7)
Glucose, Bld: 85 mg/dL (ref 65–99)
Potassium: 3.9 mmol/L (ref 3.5–5.3)
Sodium: 136 mmol/L (ref 135–146)
Total Bilirubin: 0.6 mg/dL (ref 0.2–1.2)
Total Protein: 6 g/dL — ABNORMAL LOW (ref 6.1–8.1)

## 2020-04-02 LAB — HEMOGLOBIN A1C
Hgb A1c MFr Bld: 5.7 % of total Hgb — ABNORMAL HIGH (ref <5.7)
Mean Plasma Glucose: 117 (calc)
eAG (mmol/L): 6.5 (calc)

## 2020-04-02 LAB — CBC WITH DIFFERENTIAL/PLATELET
Absolute Monocytes: 632 cells/uL (ref 200–950)
Basophils Absolute: 71 cells/uL (ref 0–200)
Basophils Relative: 1 %
Eosinophils Absolute: 312 cells/uL (ref 15–500)
Eosinophils Relative: 4.4 %
HCT: 38.6 % (ref 38.5–50.0)
Hemoglobin: 13.1 g/dL — ABNORMAL LOW (ref 13.2–17.1)
Lymphs Abs: 1633 cells/uL (ref 850–3900)
MCH: 31.7 pg (ref 27.0–33.0)
MCHC: 33.9 g/dL (ref 32.0–36.0)
MCV: 93.5 fL (ref 80.0–100.0)
MPV: 9.6 fL (ref 7.5–12.5)
Monocytes Relative: 8.9 %
Neutro Abs: 4452 cells/uL (ref 1500–7800)
Neutrophils Relative %: 62.7 %
Platelets: 232 10*3/uL (ref 140–400)
RBC: 4.13 10*6/uL — ABNORMAL LOW (ref 4.20–5.80)
RDW: 12.8 % (ref 11.0–15.0)
Total Lymphocyte: 23 %
WBC: 7.1 10*3/uL (ref 3.8–10.8)

## 2020-04-02 LAB — LIPID PANEL
Cholesterol: 114 mg/dL (ref <200)
HDL: 47 mg/dL (ref >=40)
LDL Cholesterol (Calc): 49 mg/dL (calc)
Non-HDL Cholesterol (Calc): 67 mg/dL (calc) (ref <130)
Total CHOL/HDL Ratio: 2.4 (calc) (ref <5.0)
Triglycerides: 97 mg/dL (ref <150)

## 2020-04-03 DIAGNOSIS — S025XXA Fracture of tooth (traumatic), initial encounter for closed fracture: Secondary | ICD-10-CM | POA: Diagnosis not present

## 2020-04-04 ENCOUNTER — Encounter: Payer: Self-pay | Admitting: Internal Medicine

## 2020-04-04 ENCOUNTER — Ambulatory Visit (INDEPENDENT_AMBULATORY_CARE_PROVIDER_SITE_OTHER): Payer: Medicare HMO | Admitting: Internal Medicine

## 2020-04-04 ENCOUNTER — Other Ambulatory Visit: Payer: Self-pay

## 2020-04-04 VITALS — BP 110/62 | HR 63 | Temp 97.8°F | Ht 73.0 in | Wt 214.0 lb

## 2020-04-04 DIAGNOSIS — I10 Essential (primary) hypertension: Secondary | ICD-10-CM | POA: Diagnosis not present

## 2020-04-04 DIAGNOSIS — M5442 Lumbago with sciatica, left side: Secondary | ICD-10-CM

## 2020-04-04 DIAGNOSIS — Z0000 Encounter for general adult medical examination without abnormal findings: Secondary | ICD-10-CM

## 2020-04-04 DIAGNOSIS — Z1159 Encounter for screening for other viral diseases: Secondary | ICD-10-CM

## 2020-04-04 DIAGNOSIS — R739 Hyperglycemia, unspecified: Secondary | ICD-10-CM | POA: Diagnosis not present

## 2020-04-04 DIAGNOSIS — I25110 Atherosclerotic heart disease of native coronary artery with unstable angina pectoris: Secondary | ICD-10-CM

## 2020-04-04 DIAGNOSIS — E782 Mixed hyperlipidemia: Secondary | ICD-10-CM

## 2020-04-04 DIAGNOSIS — Z1211 Encounter for screening for malignant neoplasm of colon: Secondary | ICD-10-CM

## 2020-04-04 DIAGNOSIS — G8929 Other chronic pain: Secondary | ICD-10-CM

## 2020-04-04 NOTE — Progress Notes (Signed)
Provider:  Rexene Edison. Mariea Stephens, D.O., C.Stephens.D. Location:   [place]  Place of Service:   clinic  Previous PCP: Mitchell Curry, DO Patient Care Team: Mitchell Curry, DO as PCP - General (Geriatric Medicine) [place], Peter M, MD as PCP - Cardiology (Cardiology) Mitchell Stephens, [place] as Referring Physician (Dermatology) Mitchell Manner, MD as Referring Physician (Otolaryngology)  Extended Emergency Contact Information Primary Emergency Contact: [facility] Address: [address]          [place], [place] [address] Mitchell Stephens of [place] Phone: [phone] Mobile Phone: [phone] Relation: Spouse  Code Status: DNR Goals of Care: Advanced Directive information Advanced Directives 04/04/2020  Does Patient Have a Medical Advance Directive? -  Type of Advance Directive [place]  Does patient want to make changes to medical advance directive? No - Guardian declined  Copy of [place] in Chart? Yes - validated most recent copy scanned in chart (See row information)  Would patient like information on creating a medical advance directive? -   Chief Complaint  Patient presents with  . Annual Exam    Annual physical exam     HPI: Patient is a 74 y.o. male seen today for an annual physical exam.  Says he can feel that he's anemic when walking.    He is on two diuretics.  Renal function slightly diminished.  His bps have been running as low as 98/52.  He does check his blood pressure regularly--takes it three times and it averages it out.    He's been eating a ton of fruit lately.  hba1c 5.7.  Admits he'd actually eaten at least a dozen chocolate chip cookies and some ice cream.  LDL remains at goal with the medications.  Circumflex is 60% blocked (3 yrs ago).    He still has angina.  It's when he walks or mows the yard.  It subsides after 45 mins to an hour.  Sees him next in September.    He eats plenty of greens and chicken, some  shrimp and scallops.    He had dental work.  Is on pain med for that since yesterday--hydrocodone/apap.  He is overdue for his colonoscopy.    Past Medical History:  Diagnosis Date  . Coronary artery disease    a. 06/2017 MV: EF 45%, large inf and apical defect w/o ischemia;  b. 06/2017 Cath: LM nl, LAD 30ost, LCX 50p, OM1/2 ok, RCA 100p w/ L->R collats. EF 35%.  . Depression with anxiety   . Gastric ulcer, acute with hemorrhage 2001   Dr. Sharlet Salina, [facility]  . H/O congenital atrial septal defect (ASD) repair   . Helicobacter pylori gastritis   . Heme positive stool   . History of gout   . History of peptic ulcer disease    had bleeding ulcers requiring transfusion  . History of PSVT (paroxysmal supraventricular tachycardia)   . Hyperlipidemia   . Hypertension   . Ischemic cardiomyopathy    a. 06/2017 LV gram: EF 35%.  . Obstructive sleep apnea    on CPAP  . Perennial allergic rhinitis   . Squamous cell carcinoma of skin   . Tobacco abuse    Past Surgical History:  Procedure Laterality Date  . ASD REPAIR, OSTIUM PRIMUM  1966  . 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 LEFT HEART CATH AND CORONARY ANGIOGRAPHY Left 06/30/2017   Procedure: LEFT HEART CATH AND CORONARY ANGIOGRAPHY;  Surgeon: Minna Merritts,  MD;  Location: [facility];  Service: Cardiovascular;  Laterality: Left;  . open heart surgery  1966   Dr. Annamaria Boots ([facility]); was PFO repair--has been normal in f/u  . PALATE / UVULA BIOPSY / EXCISION  2000   Dr.Sprenhe [facility])  . TONSILLECTOMY AND ADENOIDECTOMY  1959    reports that he quit smoking about 4 years ago. He quit after 40.00 years of use. He has never used smokeless tobacco. He reports that he does not drink alcohol and does not use drugs.  Functional Status Survey:    Family History  Problem Relation Age of Onset  . Failure to thrive Mother   . Arthritis Mother     Health Maintenance  Topic Date Due  . Hepatitis C Screening  Never done    . COLONOSCOPY  03/22/2019  . TETANUS/TDAP  09/11/2024 (Originally 07/31/1965)  . INFLUENZA VACCINE  05/05/2020  . COVID-19 Vaccine  Completed  . PNA vac Low Risk Adult  Completed    Allergies  Allergen Reactions  . Pollen Extract Other (See Comments)    Stuffy nose/itchy throat (Seasonal allergies)    Outpatient Encounter Medications as of 04/04/2020  Medication Sig  . aspirin EC 81 MG tablet Take 81 mg by mouth daily.  [place] Kitchen buPROPion (WELLBUTRIN XL) 300 MG 24 hr tablet Take 1 tablet (300 mg total) by mouth daily.  [place] Kitchen ezetimibe (ZETIA) 10 MG tablet Take 1 tablet (10 mg total) by mouth daily.  . hydrochlorothiazide (HYDRODIURIL) 25 MG tablet Take 1 tablet (25 mg total) by mouth daily as needed.  . isosorbide mononitrate (IMDUR) 30 MG 24 hr tablet TAKE 0.5 TABLETS (15 MG TOTAL) BY MOUTH DAILY.  [place] Kitchen losartan (COZAAR) 25 MG tablet TAKE 1 TABLET BY MOUTH EVERY DAY  . metoprolol succinate (TOPROL-XL) 100 MG 24 hr tablet TAKE 1 TABLET BY MOUTH EVERY DAY  . nitroGLYCERIN (NITROSTAT) 0.4 MG SL tablet PLACE 1 TABLET (0.4 MG TOTAL) UNDER THE TONGUE EVERY 5 (FIVE) MINUTES AS NEEDED FOR CHEST PAIN.  [place] Kitchen propranolol (INDERAL) 20 MG tablet TAKE 1 TABLET (20 MG TOTAL) BY MOUTH AS NEEDED (AS NEEDED FOR FAST HEART RATES).  [place] Kitchen senna (SENOKOT) 8.6 MG tablet Take 2 tablets by mouth at bedtime.  . simvastatin (ZOCOR) 40 MG tablet Take 1 tablet (40 mg total) by mouth daily.  [place] Kitchen spironolactone (ALDACTONE) 25 MG tablet Take 1 tablet (25 mg total) by mouth daily.  . traZODone (DESYREL) 50 MG tablet Take 1 tablet (50 mg total) by mouth at bedtime as needed. for insomnia   No facility-administered encounter medications on file as of 04/04/2020.    Review of Systems  Constitutional: Negative for chills, fever and malaise/fatigue.  HENT: Negative for congestion, hearing loss and sore throat.   Eyes: Negative for blurred vision.  Respiratory: Negative for cough and shortness of breath.   Cardiovascular: Positive for chest  pain. Negative for palpitations, orthopnea, leg swelling and PND.       Reports posterior buttock pain with prolonged walks  Gastrointestinal: Negative for abdominal pain, blood in stool, constipation, diarrhea, heartburn and melena.  Genitourinary: Negative for dysuria.  Musculoskeletal: Positive for back pain. Negative for falls.  Skin: Negative for itching and rash.  Neurological: Negative for dizziness and loss of consciousness.  Endo/Heme/Allergies: Bruises/bleeds easily.  Psychiatric/Behavioral: Negative for depression, memory loss and suicidal ideas. The patient is not nervous/anxious and does not have insomnia.        Continues to attend his AA groups faithfully  Vitals:   04/04/20 1332  BP: 110/62  Pulse: 63  Temp: 97.8 F (36.6 C)  TempSrc: Temporal  SpO2: 97%  Weight: 214 lb (97.1 kg)  Height: 6\' 1"  (1.854 Stephens)   Body mass index is 28.23 kg/Stephens. Physical Exam Vitals reviewed.  Constitutional:      General: He is not in acute distress.    Appearance: Normal appearance. He is not ill-appearing or toxic-appearing.  HENT:     Head: Normocephalic and atraumatic.     Right Ear: Tympanic membrane, ear canal and external ear normal.     Left Ear: Tympanic membrane, ear canal and external ear normal.     Nose: Nose normal.     Mouth/Throat:     Pharynx: Oropharynx is clear.  Eyes:     Extraocular Movements: Extraocular movements intact.     Conjunctiva/sclera: Conjunctivae normal.     Pupils: Pupils are equal, round, and reactive to light.  Cardiovascular:     Rate and Rhythm: Normal rate and regular rhythm.     Pulses: Normal pulses.     Heart sounds: Normal heart sounds. No murmur heard.  No friction rub. No gallop.   Pulmonary:     Effort: Pulmonary effort is normal.     Breath sounds: Normal breath sounds. No wheezing, rhonchi or rales.  Abdominal:     General: Bowel sounds are normal. There is no distension.     Palpations: Abdomen is soft.     Tenderness:  There is no abdominal tenderness. There is no guarding or rebound.  Musculoskeletal:        General: No swelling or tenderness. Normal range of motion.     Cervical back: Neck supple.     Right lower leg: No edema.     Left lower leg: No edema.  Lymphadenopathy:     Cervical: No cervical adenopathy.  Skin:    General: Skin is warm and dry.     Capillary Refill: Capillary refill takes less than 2 seconds.  Neurological:     General: No focal deficit present.     Mental Status: He is alert and oriented to person, place, and time.  Psychiatric:        Mood and Affect: Mood normal.        Behavior: Behavior normal.        Thought Content: Thought content normal.        Judgment: Judgment normal.     Comments: A bit more "subdude" after having norco from dental work     Labs reviewed: Basic Metabolic Panel: Recent Labs    05/09/19 1008 04/01/20 0843  NA 134* 136  K 4.4 3.9  CL 98 102  CO2 29 26  GLUCOSE 103* 85  BUN 14 22  CREATININE 1.26* 1.33*  CALCIUM 9.2 9.3   Liver Function Tests: Recent Labs    05/09/19 1008 04/01/20 0843  AST 15 14  ALT 13 13  BILITOT 0.9 0.6  PROT 6.4 6.0*   No results for input(s): LIPASE, AMYLASE in the last 8760 hours. No results for input(s): AMMONIA in the last 8760 hours. CBC: Recent Labs    05/09/19 1008 04/01/20 0843  WBC 20.2* 7.1  NEUTROABS 17,251* 4,452  HGB 13.6 13.1*  HCT 40.0 38.6  MCV 90.9 93.5  PLT 221 232   Cardiac Enzymes: No results for input(s): CKTOTAL, CKMB, CKMBINDEX, TROPONINI in the last 8760 hours. BNP: Invalid input(s): POCBNP Lab Results  Component Value Date   HGBA1C  5.7 (H) 04/01/2020   No results found for: TSH  Assessment/Plan 1. Annual physical exam -performed today -EKGs are done with cardiology -no active chest pain -up to date on prevention except colonoscopy for which referral placed and hep c screen which was ordered for before next appt -had covid series - Basic metabolic panel;  Future - Hepatitis C antibody; Future  2. Coronary artery disease involving native coronary artery of native heart with unstable angina pectoris ([place]) -continues to get chest pain during his walks which has always resolved thus far -he has f/u with cardiology upcoming  3. Essential hypertension, benign - bp is too well controlled to where he's getting dizzy and his energy levels are very low -we opted to try stopping his hctz and see 1) how he feels 2) how his bp runs--he will monitor at home and call if it's going over 130/80 consistently - Basic metabolic panel; Future  4. Mixed hyperlipidemia -at goal with current zocor and zetia regimen along with fairly healthy eating most of the time and walking program Lab Results  Component Value Date   LDLCALC 49 04/01/2020   5. Hyperglycemia Lab Results  Component Value Date   HGBA1C 5.7 (H) 04/01/2020  -remains in prediabetic range -did eat a bunch of cookies and ice cream (outside of the norm) in days prior to labs  6. Screening for colon cancer - is due for repeat cscope--previously done at [facility], but his doctor there has retired so seeking care w/in [facility] system - Ambulatory referral to Gastroenterology  7. Chronic midline low back pain with left-sided sciatica -is not interfering with his daily routine at this point and not taking regular meds for it, rest seems to help, cont to monitor  8. Encounter for hepatitis C screening test for low risk patient - check Hepatitis C antibody; Future before next visit  Labs/tests ordered:   Orders Placed This Encounter  Procedures  . Basic metabolic panel    Standing Status:   Future    Standing Expiration Date:   01/03/2021    Order Specific Question:   Has the patient fasted?    Answer:   Yes  . Hepatitis C antibody    Standing Status:   Future    Standing Expiration Date:   01/03/2021  . Ambulatory referral to Gastroenterology    Referral Priority:   Routine    Referral Type:    Consultation    Referral Reason:   Specialty Services Required    Number of Visits Requested:   1   F/u in 6 mos with fasting labs before  Mitchell Stephens, D.O. [facility] [address],  [address] Cell Phone (Mon-Fri 8am-5pm):  [phone] On Call:  [phone] & follow prompts after 5pm & weekends Office Phone:  [phone] Office Fax:  [phone]

## 2020-04-04 NOTE — Patient Instructions (Addendum)
Stop hydrochlorothiazide.   Monitor your blood pressure.  Let's keep it under 130/80.

## 2020-04-04 NOTE — Progress Notes (Signed)
Cholesterol is at goal. Sugar average has improved a bit. Kidneys are stable. Electrolytes and liver tests are in normal range. He has very very mild anemia which he's had at a prior time 2 years ago and may be related to age and anticoagulation.  Values in between have been normal.

## 2020-04-08 ENCOUNTER — Other Ambulatory Visit: Payer: Self-pay | Admitting: Internal Medicine

## 2020-04-16 ENCOUNTER — Encounter: Payer: Self-pay | Admitting: Gastroenterology

## 2020-04-16 ENCOUNTER — Ambulatory Visit: Payer: Medicare HMO | Admitting: Gastroenterology

## 2020-04-16 VITALS — BP 130/70 | HR 72 | Ht 71.5 in | Wt 214.1 lb

## 2020-04-16 DIAGNOSIS — Z86010 Personal history of colonic polyps: Secondary | ICD-10-CM | POA: Diagnosis not present

## 2020-04-16 DIAGNOSIS — I208 Other forms of angina pectoris: Secondary | ICD-10-CM | POA: Diagnosis not present

## 2020-04-16 NOTE — Progress Notes (Signed)
Referring Provider: Gayland Curry, DO Primary Care Physician:  Gayland Curry, DO  Reason for Consultation:  History of polyps   IMPRESSION:  History of colon polyps    - TVA removed in 2012    - No polyps per patient report in 2016 Exertional angina Maternal aunt and maternal grandmother with colon polyps  He is due surveillance colonoscopy. Given his ongoing exertional angina, will await cardiology clearance. He has an appointment scheduled in September.     PLAN: High fiber diet, drink at least 64 ounces of water, consider daily psyllium for stool bulking Colonoscopy if cleared cardiology during his upcoming visit in 06/24/20   The nature of the procedure, as well as the risks, benefits, and alternatives were carefully and thoroughly reviewed with the patient. Ample time for discussion and questions allowed. The patient understood, was satisfied, and agreed to proceed if cleared by cardiology.  Please see the "Patient Instructions" section for addition details about the plan.  I spent 40 minutes, including in depth chart review, independent review of results, face-to-face time with the patient, coordinating care, ordering studies and medications as appropriate, and documentation.   HPI: Mitchell Stephens. is a 74 y.o. male referred by Dr. Mariea Clonts to establish care.  The history is obtained through the patient and review of his electronic health record.  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 Coronary artery disease, occluded RCA proximal region (presumably started March 2018), OSA on CPAP, HTN, hyperlipidemia, prior episodes of tachycardia associated with alcohol, none recently, and  asd repair at age age 11. LVEF on last echo 09/2017 was 50%. Last evaluation by cardiology 2 years. Has upcoming appointment due to angina that occurs when walking or mowing the yard.  Colonoscopy with Dr. Candace Cruise in 2012 for blood in the stool. 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 Procedure note not in Epic.   Colonoscopy with Dr. Candace Cruise in 2016 because stools were dark. Gatorade/Miralax. Less effective than Fleet. No polyps.  Surveillance scheduled in 2020 but never performed due to the pandemic.   Has a bowel movement using senna daily. No blood or mucous. No ongoing GI symptoms.   Maternal aunt and maternal grandmother with colon polyps. No other known family history of colon cancer or polyps. No family history of uterine/endometrial cancer, pancreatic cancer or gastric/stomach cancer.   Past Medical History:  Diagnosis Date  . Coronary artery disease    a. 06/2017 MV: EF 45%, large inf and apical defect w/o ischemia;  b. 06/2017 Cath: LM nl, LAD 30ost, LCX 50p, OM1/2 ok, RCA 100p w/ L->R collats. EF 35%.  . Depression with anxiety   . Gastric ulcer, acute with hemorrhage 2001   Dr. Sharlet Salina, [facility]  . H/O congenital atrial septal defect (ASD) repair   . Helicobacter pylori gastritis   . Heme positive stool   . History of gout   . History of peptic ulcer disease    had bleeding ulcers requiring transfusion  . History of PSVT (paroxysmal supraventricular tachycardia)   . Hyperlipidemia   . Hypertension   . Ischemic cardiomyopathy    a. 06/2017 LV gram: EF 35%.  . Obstructive sleep apnea    on CPAP  . Perennial allergic rhinitis   . Squamous cell carcinoma of skin   . Tobacco abuse     Past Surgical History:  Procedure Laterality Date  . ASD REPAIR, OSTIUM PRIMUM  1966  . EAR BIOPSY  2013   Dr.Cook--squamous  cell ca ([facility])  . LEFT HEART CATH AND CORONARY ANGIOGRAPHY Left 06/30/2017   Procedure: LEFT HEART CATH AND CORONARY ANGIOGRAPHY;  Surgeon: Minna Merritts, MD;  Location: [facility];  Service: Cardiovascular;  Laterality: Left;  . open heart surgery  1966   Dr. Annamaria Boots ([facility]); was PFO repair--has been normal in f/u  . PALATE / UVULA BIOPSY / EXCISION  2000   Dr.Sprenhe [facility])  . TONSILLECTOMY AND ADENOIDECTOMY  1959    Current  Outpatient Medications  Medication Sig Dispense Refill  . aspirin EC 81 MG tablet Take 81 mg by mouth daily.    Marland Kitchen buPROPion (WELLBUTRIN XL) 300 MG 24 hr tablet Take 1 tablet (300 mg total) by mouth daily. 90 tablet 1  . ezetimibe (ZETIA) 10 MG tablet Take 1 tablet (10 mg total) by mouth daily. 90 tablet 1  . isosorbide mononitrate (IMDUR) 30 MG 24 hr tablet TAKE 0.5 TABLETS (15 MG TOTAL) BY MOUTH DAILY. 45 tablet 1  . losartan (COZAAR) 25 MG tablet TAKE 1 TABLET BY MOUTH EVERY DAY 90 tablet 0  . metoprolol succinate (TOPROL-XL) 100 MG 24 hr tablet TAKE 1 TABLET BY MOUTH EVERY DAY 90 tablet 3  . nitroGLYCERIN (NITROSTAT) 0.4 MG SL tablet PLACE 1 TABLET (0.4 MG TOTAL) UNDER THE TONGUE EVERY 5 (FIVE) MINUTES AS NEEDED FOR CHEST PAIN. 25 tablet 1  . propranolol (INDERAL) 20 MG tablet TAKE 1 TABLET (20 MG TOTAL) BY MOUTH AS NEEDED (AS NEEDED FOR FAST HEART RATES). 60 tablet 0  . senna (SENOKOT) 8.6 MG tablet Take 2 tablets by mouth at bedtime.    . simvastatin (ZOCOR) 40 MG tablet Take 1 tablet (40 mg total) by mouth daily. 90 tablet 2  . spironolactone (ALDACTONE) 25 MG tablet Take 1 tablet (25 mg total) by mouth daily. 90 tablet 1  . traZODone (DESYREL) 50 MG tablet Take 1 tablet (50 mg total) by mouth at bedtime as needed. for insomnia 90 tablet 1   No current facility-administered medications for this visit.    Allergies as of 04/16/2020 - Review Complete 04/07/2020  Allergen Reaction Noted  . Pollen extract Other (See Comments)     Family History  Problem Relation Age of Onset  . Failure to thrive Mother   . Arthritis Mother     Social History   Socioeconomic History  . Marital status: Married    Spouse name: Not on file  . Number of children: Not on file  . Years of education: Not on file  . Highest education level: Not on file  Occupational History  . Not on file  Tobacco Use  . Smoking status: Former Smoker    Years: 40.00    Quit date: 09/04/2015    Years since  quitting: 4.6  . Smokeless tobacco: Never Used  Vaping Use  . Vaping Use: Never used  Substance and Sexual Activity  . Alcohol use: No    Alcohol/week: 0.0 standard drinks    Comment: previously did drink alcohol heavily  . Drug use: No    Comment: likes to avoid pain meds, no illicit drugs used  . Sexual activity: Yes  Other Topics Concern  . Not on file  Social History Narrative   Drinks caffeine   Married in 1973   Lives in house   Two story home, 2 live in home   [place] and does yardwork   Has living will and [employer] [facility]   Social Determinants of Health  Financial Resource Strain:   . Difficulty of Paying Living Expenses:   Food Insecurity:   . Worried About Charity fundraiser in the Last Year:   . Arboriculturist in the Last Year:   Transportation Needs:   . Film/video editor (Medical):   Marland Kitchen Lack of Transportation (Non-Medical):   Physical Activity:   . Days of Exercise per Week:   . Minutes of Exercise per Session:   Stress:   . Feeling of Stress :   Social Connections:   . Frequency of Communication with Friends and Family:   . Frequency of Social Gatherings with Friends and Family:   . Attends Religious Services:   . Active Member of Clubs or Organizations:   . Attends Archivist Meetings:   Marland Kitchen Marital Status:   Intimate Partner Violence:   . Fear of Current or Ex-Partner:   . Emotionally Abused:   Marland Kitchen Physically Abused:   . Sexually Abused:     Physical Exam: General:   Alert,  well-nourished, pleasant and cooperative in NAD Head:  Normocephalic and atraumatic. Eyes:  Sclera clear, no icterus.   Conjunctiva pink. Ears:  Normal auditory acuity. Nose:  No deformity, discharge,  or lesions. Mouth:  No deformity or lesions.   Neck:  Supple; no masses or thyromegaly. Lungs:  Clear throughout to auscultation.   No wheezes. Heart:  Regular rate and rhythm; no murmurs. Abdomen:  Soft,nontender, nondistended, normal bowel sounds, no  rebound or guarding. No hepatosplenomegaly.   Rectal:  Deferred  Msk:  Symmetrical. No boney deformities LAD: No inguinal or umbilical LAD Extremities:  No clubbing or edema. Neurologic:  Alert and  oriented x4;  grossly nonfocal Skin:  Intact without significant lesions or rashes. Psych:  Alert and cooperative. Normal mood and affect.    Mitchell Stephens L. Tarri Glenn, MD, MPH 04/16/2020, 1:50 PM

## 2020-04-16 NOTE — Patient Instructions (Signed)
It has been recommended to you by your physician that you have a(n) Colonoscopy completed.  We did not schedule the procedure(s) today because Dr. Tarri Glenn would like for you to have your cardiac appointment in Sept 2021 before scheduling. Our office will contact you once we have clearance from your cardiologist. If you have not heard from [place] within 2-3 weeks after your cardiac visit, please contact office at [phone] and ask for Noble Cicalese.-CMA.   If you are age 75 or older, your body mass index should be between 23-30. Your Body mass index is 29.45 kg/m. If this is out of the aforementioned range listed, please consider follow up with your Primary Care Provider.  If you are age 9 or younger, your body mass index should be between 19-25. Your Body mass index is 29.45 kg/m. If this is out of the aformentioned range listed, please consider follow up with your Primary Care Provider.    Thank you for choosing me and [facility].  Dr.Beavers

## 2020-04-18 ENCOUNTER — Encounter: Payer: Self-pay | Admitting: Gastroenterology

## 2020-04-23 ENCOUNTER — Other Ambulatory Visit: Payer: Self-pay | Admitting: Internal Medicine

## 2020-05-13 ENCOUNTER — Ambulatory Visit: Payer: Medicare HMO | Admitting: Dermatology

## 2020-05-13 ENCOUNTER — Encounter: Payer: Self-pay | Admitting: Dermatology

## 2020-05-13 ENCOUNTER — Other Ambulatory Visit: Payer: Self-pay

## 2020-05-13 DIAGNOSIS — L570 Actinic keratosis: Secondary | ICD-10-CM

## 2020-05-13 DIAGNOSIS — D045 Carcinoma in situ of skin of trunk: Secondary | ICD-10-CM

## 2020-05-13 DIAGNOSIS — D485 Neoplasm of uncertain behavior of skin: Secondary | ICD-10-CM

## 2020-05-13 DIAGNOSIS — C4491 Basal cell carcinoma of skin, unspecified: Secondary | ICD-10-CM

## 2020-05-13 DIAGNOSIS — L578 Other skin changes due to chronic exposure to nonionizing radiation: Secondary | ICD-10-CM | POA: Diagnosis not present

## 2020-05-13 DIAGNOSIS — D099 Carcinoma in situ, unspecified: Secondary | ICD-10-CM

## 2020-05-13 DIAGNOSIS — L820 Inflamed seborrheic keratosis: Secondary | ICD-10-CM

## 2020-05-13 DIAGNOSIS — C44719 Basal cell carcinoma of skin of left lower limb, including hip: Secondary | ICD-10-CM

## 2020-05-13 HISTORY — DX: Basal cell carcinoma of skin, unspecified: C44.91

## 2020-05-13 NOTE — Progress Notes (Addendum)
Follow-Up Visit   Subjective  Mitchell Stephens. is a 74 y.o. 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 He complains of a spot on Right lower leg and scaly rough areas of face. Patient also here to have 2 spots at left pretibial rechecked and possible biopsy.  The following portions of the chart were reviewed this encounter and updated as appropriate:  Tobacco  Allergies  Meds  Problems  Med Hx  Surg Hx  Fam Hx     Review of Systems:  No other skin or systemic complaints except as noted in HPI or Assessment and Plan.  Objective  Well appearing patient in no apparent distress; mood and affect are within normal limits.  A focused examination was performed including chest, legs. Relevant physical exam findings are noted in the Assessment and Plan.  Objective  L prox medial pretibial: Annular patch with raised edge 1.0cm  Objective  Face x 19 (19): Erythematous thin papules/macules with gritty scale.   Objective  Face x 1, hands x 5 (6): Erythematous keratotic or waxy stuck-on papule or plaque.   Objective  Right clavicle: Healing biopsy site 1.2cm   Assessment & Plan  Neoplasm of uncertain behavior of skin L prox medial pretibial  Skin / nail biopsy Type of biopsy: tangential   Informed consent: discussed and consent obtained   Timeout: patient name, date of birth, surgical site, and procedure verified   Procedure prep:  Patient was prepped and draped in usual sterile fashion Prep type:  Isopropyl alcohol Anesthesia: the lesion was anesthetized in a standard fashion   Anesthetic:  1% lidocaine w/ epinephrine 1-100,000 buffered w/ 8.4% NaHCO3 Instrument used: flexible razor blade   Hemostasis achieved with: pressure, aluminum chloride and electrodesiccation   Outcome: patient tolerated procedure well   Post-procedure details: sterile dressing applied and wound care instructions  given   Dressing type: petrolatum and bandage    Specimen 1 - Surgical pathology Differential Diagnosis: Porokeratosis vs SCC Check Margins: No Annular patch with raised edge 1.0cm  AK (actinic keratosis) (19) Face x 19  Destruction of lesion - Face x 19 Complexity: simple   Destruction method: cryotherapy   Informed consent: discussed and consent obtained   Timeout:  patient name, date of birth, surgical site, and procedure verified Lesion destroyed using liquid nitrogen: Yes   Region frozen until ice ball extended beyond lesion: Yes   Outcome: patient tolerated procedure well with no complications   Post-procedure details: wound care instructions given    Inflamed seborrheic keratosis (6) Face x 1, hands x 5  Destruction of lesion - Face x 1, hands x 5 Complexity: simple   Destruction method: cryotherapy   Informed consent: discussed and consent obtained   Timeout:  patient name, date of birth, surgical site, and procedure verified Lesion destroyed using liquid nitrogen: Yes   Region frozen until ice ball extended beyond lesion: Yes   Outcome: patient tolerated procedure well with no complications   Post-procedure details: wound care instructions given    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  Destruction of lesion Complexity: extensive   Destruction method: electrodesiccation and curettage   Informed consent: discussed and consent obtained   Timeout:  patient name, date of birth, surgical site, and procedure verified Procedure prep:  Patient was prepped and draped in usual sterile fashion Prep type:  Isopropyl alcohol Anesthesia: the lesion was anesthetized in a  standard fashion   Anesthetic:  1% lidocaine w/ epinephrine 1-100,000 buffered w/ 8.4% NaHCO3 Curettage performed in three different directions: Yes   Electrodesiccation performed over the curetted area: Yes   Lesion length (cm):  1 Lesion width (cm):  1 Margin per side (cm):   0.3 Final wound size (cm):  1.6 Hemostasis achieved with:  pressure, aluminum chloride and electrodesiccation Outcome: patient tolerated procedure well with no complications   Post-procedure details: sterile dressing applied and wound care instructions given   Dressing type: bandage and petrolatum    Post tx defect 1.6cm  Actinic Damage - diffuse scaly erythematous macules with underlying dyspigmentation - Recommend daily broad spectrum sunscreen SPF 30+ to sun-exposed areas, reapply every 2 hours as needed.  - Call for new or changing lesions.  Return in about 3 months (around 08/13/2020) for AK follow up.  Graciella Belton, RMA, am acting as scribe for Sarina Ser, MD . Documentation: I have reviewed the above documentation for accuracy and completeness, and I agree with the above.  Sarina Ser, MD

## 2020-05-13 NOTE — Patient Instructions (Addendum)
Wound Care Instructions  1. Cleanse wound gently with soap and water once a day then pat dry with clean gauze. Apply a thing coat of Petrolatum (petroleum jelly, "Vaseline") over the wound (unless you have an allergy to this). We recommend that you use a new, sterile tube of Vaseline. Do not pick or remove scabs. Do not remove the yellow or white "healing tissue" from the base of the wound.  2. Cover the wound with fresh, clean, nonstick gauze and secure with paper tape. You may use Band-Aids in place of gauze and tape if the would is small enough, but would recommend trimming much of the tape off as there is often too much. Sometimes Band-Aids can irritate the skin.  3. You should call the office for your biopsy report after 1 week if you have not already been contacted.  4. If you experience any problems, such as abnormal amounts of bleeding, swelling, significant bruising, significant pain, or evidence of infection, please call the office immediately.  FOR ADULT SURGERY PATIENTS: If you need something for pain relief you may take 1 extra strength Tylenol (acetaminophen) AND 2 Ibuprofen (200mg  each) together every 4 hours as needed for pain. (do not take these if you are allergic to them or if you have a reason you should not take them.) Typically, you may only need pain medication for 1 to 3 days.   Cryotherapy Aftercare  . Wash gently with soap and water everyday.   Marland Kitchen Apply Vaseline and Band-Aid daily until healed.

## 2020-05-14 ENCOUNTER — Encounter: Payer: Self-pay | Admitting: Dermatology

## 2020-05-15 ENCOUNTER — Ambulatory Visit: Payer: Medicare HMO | Admitting: Nurse Practitioner

## 2020-05-20 ENCOUNTER — Telehealth: Payer: Self-pay

## 2020-05-20 NOTE — Telephone Encounter (Signed)
Patient informed of pathology results and prefers to wait until his next follow up appointment to treat.

## 2020-05-20 NOTE — Telephone Encounter (Signed)
-----   Message from Ralene Bathe, MD sent at 05/20/2020  4:41 PM EDT ----- Skin , left porx medial pretibial SUPERFICIAL BASAL CELL CARCINOMA  Cancer - [facility] Superficial Schedule for treatment (EDC)

## 2020-05-24 ENCOUNTER — Other Ambulatory Visit: Payer: Self-pay | Admitting: *Deleted

## 2020-05-24 MED ORDER — BUPROPION HCL ER (XL) 300 MG PO TB24: 300.0000 mg | ORAL_TABLET | Freq: Every day | ORAL | 1 refills | Status: DC

## 2020-05-24 NOTE — Telephone Encounter (Signed)
Patient requested refill

## 2020-06-07 ENCOUNTER — Ambulatory Visit (INDEPENDENT_AMBULATORY_CARE_PROVIDER_SITE_OTHER): Payer: Medicare HMO

## 2020-06-07 ENCOUNTER — Other Ambulatory Visit: Payer: Self-pay

## 2020-06-07 DIAGNOSIS — Z23 Encounter for immunization: Secondary | ICD-10-CM | POA: Diagnosis not present

## 2020-06-18 ENCOUNTER — Other Ambulatory Visit: Payer: Self-pay | Admitting: Cardiovascular Disease

## 2020-06-20 ENCOUNTER — Other Ambulatory Visit: Payer: Self-pay | Admitting: Internal Medicine

## 2020-06-21 ENCOUNTER — Other Ambulatory Visit: Payer: Self-pay | Admitting: *Deleted

## 2020-06-21 MED ORDER — EZETIMIBE 10 MG PO TABS
10.0000 mg | ORAL_TABLET | Freq: Every day | ORAL | 1 refills | Status: DC
Start: 2020-06-21 — End: 2020-12-19

## 2020-06-21 NOTE — Progress Notes (Signed)
Date:  06/24/2020   ID:  Mitchell Frame., DOB [birth year], MRN [id number]  Patient Location:  [address] [place] [address]   Provider location:   [facility], [place] office  PCP:  Gayland Curry, DO  Cardiologist:  Arvid Right [facility]   Chief Complaint  Patient presents with  . OTHER    OD 12 month f/u no complaints today. Meds reviewed verbally with pt.     History of Present Illness:    Mitchell Cogdell. is a 74 y.o. male  past medical history of Coronary artery disease, occluded RCA proximal region (presumably started March 2018) History of smoking, stopped November 2017 OSA on CPAP HTN Hyperlipidemia Former alcoholic, stopped >02 years ago, Prior episodes of tachycardia associated with alcohol, previously declined monitor asd repair, age 89 Who presents for follow-up of his coronary artery disease, occluded RCA  Last seen in clinic September 2019 Telemetry visit September 2020 Blood pressure low at times, was taking all of his medications in the morning  Sedentary Jaw pain in Aug 2021 Took NTG x 2 Pain went away No further epsiodes  Not walking as much Slower pace,  Less energy, concerned about blockages  Lab work June 2021 reviewed Hemoglobin 13 Creatinine 1.33 Total cholesterol 114 LDL 49 Hemoglobin A1c 5.7  Rare episodes of tachycardia Seen on fit bit  No significant lower extremity edema  No recent trips to the emergency room ER with CP on 05/10/2018 Pain in the neck, arms Several episodes  EKG personally reviewed by myself on todays visit Shows normal sinus rhythm rate 66 bpm consider old inferior MI  Other past medical history reviewed stress testing 06/2017 which showed a large area of inferior apical scar without ischemia and an EF of 45%.   Diagnostic catheterization showed an occluded proximal right coronary artery with left to right collaterals and EF of 35-45%. F  Echo in December  showed improvement in EF to 50% with moderate MR  exercising 6 days/week    Cardiac catheterization June 30, 2017  Prox LAD lesion, 30 %stenosed.  Prox Cx lesion, 50 %stenosed.  Prox RCA lesion, 100 %stenosed.  The left ventricular ejection fraction is 35-45% by visual estimate.     Prior CV studies:   The following studies were reviewed today:    Past Medical History:  Diagnosis Date  . Alcoholism ([place])    per pt  . Coronary artery disease    a. 06/2017 MV: EF 45%, large inf and apical defect w/o ischemia;  b. 06/2017 Cath: LM nl, LAD 30ost, LCX 50p, OM1/2 ok, RCA 100p w/ L->R collats. EF 35%.  . Depression with anxiety   . Gastric ulcer, acute with hemorrhage 2001   Dr. Sharlet Salina, [facility]  . H/O congenital atrial septal defect (ASD) repair   . Helicobacter pylori gastritis   . Heme positive stool   . History of gout   . History of peptic ulcer disease    had bleeding ulcers requiring transfusion  . History of PSVT (paroxysmal supraventricular tachycardia)   . Hyperlipidemia   . Hypertension   . Ischemic cardiomyopathy    a. 06/2017 LV gram: EF 35%.  . Obstructive sleep apnea    on CPAP  . Perennial allergic rhinitis   . 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 Tobacco abuse    Past Surgical History:  Procedure Laterality Date  . ASD REPAIR, OSTIUM PRIMUM  [place] Left 2013   Dr.Cook--squamous cell ca ([facility])  . LEFT HEART CATH AND CORONARY ANGIOGRAPHY Left 06/30/2017   Procedure: LEFT HEART CATH AND CORONARY ANGIOGRAPHY;  Surgeon: Minna Merritts, MD;  Location: [facility];  Service: Cardiovascular;  Laterality: Left;  . MOHS SURGERY     ear  . open heart surgery  1966   Dr. Annamaria Boots ([facility]); was PFO repair--has been normal in f/u  . PALATE / UVULA BIOPSY / EXCISION  2000   Dr.Sprenhe [facility])  . TONSILLECTOMY AND ADENOIDECTOMY  1959      Allergies:   Pollen extract   Social History   Tobacco Use  . Smoking  status: Former Smoker    Years: 40.00    Quit date: 09/04/2015    Years since quitting: 4.8  . Smokeless tobacco: Never Used  Vaping Use  . Vaping Use: Never used  Substance Use Topics  . Alcohol use: No    Alcohol/week: 0.0 standard drinks    Comment: previously did drink alcohol heavily  . Drug use: No    Comment: likes to avoid pain meds, no illicit drugs used     Current Outpatient Medications on File Prior to Visit  Medication Sig Dispense Refill  . aspirin EC 81 MG tablet Take 81 mg by mouth daily.    Marland Kitchen buPROPion (WELLBUTRIN XL) 300 MG 24 hr tablet Take 1 tablet (300 mg total) by mouth daily. 90 tablet 1  . ezetimibe (ZETIA) 10 MG tablet Take 1 tablet (10 mg total) by mouth daily. 90 tablet 1  . isosorbide mononitrate (IMDUR) 30 MG 24 hr tablet TAKE 0.5 TABLETS (15 MG TOTAL) BY MOUTH DAILY. 45 tablet 0  . losartan (COZAAR) 25 MG tablet TAKE 1 TABLET BY MOUTH EVERY DAY 90 tablet 0  . metoprolol succinate (TOPROL-XL) 100 MG 24 hr tablet TAKE 1 TABLET BY MOUTH EVERY DAY 90 tablet 3  . nitroGLYCERIN (NITROSTAT) 0.4 MG SL tablet PLACE 1 TABLET (0.4 MG TOTAL) UNDER THE TONGUE EVERY 5 (FIVE) MINUTES AS NEEDED FOR CHEST PAIN. 25 tablet 1  . propranolol (INDERAL) 20 MG tablet TAKE 1 TABLET (20 MG TOTAL) BY MOUTH AS NEEDED (AS NEEDED FOR FAST HEART RATES). 90 tablet 1  . senna (SENOKOT) 8.6 MG tablet Take 2 tablets by mouth at bedtime.    . simvastatin (ZOCOR) 40 MG tablet Take 1 tablet (40 mg total) by mouth daily. 90 tablet 2  . spironolactone (ALDACTONE) 25 MG tablet Take 1 tablet (25 mg total) by mouth daily. 90 tablet 1  . traZODone (DESYREL) 50 MG tablet TAKE 1 TABLET (50 MG TOTAL) BY MOUTH AT BEDTIME AS NEEDED. FOR INSOMNIA 90 tablet 1   No current facility-administered medications on file prior to visit.     Family Hx: The patient's family history includes Arthritis in his mother; Brain cancer in his father; Cancer in his paternal grandfather; Failure to thrive in his mother;  Hypertension in his sister; Liver cancer in his paternal uncle; Other in an other family member; Stomach cancer in his maternal grandmother.  ROS:   Please see the history of present illness.    Review of Systems  Constitutional: Negative.        Rare throat pain  HENT: Negative.   Respiratory: Negative.   Cardiovascular: Positive for palpitations.       Tachycardia  Gastrointestinal: Negative.   Musculoskeletal: Negative.   Neurological: Negative.   Psychiatric/Behavioral: Negative.   All other systems  reviewed and are negative.    Labs/Other Tests and Data Reviewed:    Recent Labs: 04/01/2020: ALT 13; BUN 22; Creat 1.33; Hemoglobin 13.1; Platelets 232; Potassium 3.9; Sodium 136   Recent Lipid Panel Lab Results  Component Value Date/Time   CHOL 114 04/01/2020 08:43 AM   CHOL 128 01/09/2016 08:25 AM   TRIG 97 04/01/2020 08:43 AM   HDL 47 04/01/2020 08:43 AM   HDL 43 01/09/2016 08:25 AM   CHOLHDL 2.4 04/01/2020 08:43 AM   LDLCALC 49 04/01/2020 08:43 AM    Wt Readings from Last 3 Encounters:  06/24/20 212 lb (96.2 kg)  04/16/20 214 lb 2 oz (97.1 kg)  04/04/20 214 lb (97.1 kg)     Exam:    Vital Signs: Vital signs may also be detailed in the HPI BP 100/60 (BP Location: Left Arm, Patient Position: Sitting, Cuff Size: Normal)   Pulse 66   Ht 6\' 1"  (1.854 m)   Wt 212 lb (96.2 kg)   SpO2 98%   BMI 27.97 kg/m   Wt Readings from Last 3 Encounters:  06/24/20 212 lb (96.2 kg)  04/16/20 214 lb 2 oz (97.1 kg)  04/04/20 214 lb (97.1 kg)   Temp Readings from Last 3 Encounters:  04/04/20 97.8 F (36.6 C) (Temporal)  07/10/19 (!) 97.3 F (36.3 C) (Temporal)  06/01/19 97.8 F (36.6 C) (Oral)   BP Readings from Last 3 Encounters:  06/24/20 100/60  04/16/20 130/70  04/04/20 110/62   Pulse Readings from Last 3 Encounters:  06/24/20 66  04/16/20 72  04/04/20 63     Well nourished, well developed male in no acute distress. Constitutional:  oriented to person,  place, and time. No distress.  Head: Normocephalic and atraumatic.  Eyes:  no discharge. No scleral icterus.  Neck: Normal range of motion. Neck supple.  Pulmonary/Chest: No audible wheezing, no distress, appears comfortable Musculoskeletal: Normal range of motion.  no  tenderness or deformity.  Neurological:   Coordination normal. Full exam not performed Skin:  No rash Psychiatric:  normal mood and affect. behavior is normal. Thought content normal.    ASSESSMENT & PLAN:    Problem List Items Addressed This Visit      Cardiology Problems   Paroxysmal SVT (supraventricular tachycardia) (HCC)   Atherosclerosis of native coronary artery of native heart with stable angina pectoris (HCC)   Cardiomyopathy ([place])   Essential hypertension, benign   Hyperlipidemia     Other   Obstructive sleep apnea on CPAP    Other Visit Diagnoses    Paroxysmal tachycardia (HCC)    -  Primary   Tobacco abuse         Tachycardia No recent symptoms, continue current medications Continue metoprolol at current dose  CAD with stable angina Recent episode jaw pain chest pain concerning for angina We have ordered stress Myoview given history of coronary disease, 50% left circumflex disease, chronically occluded RCA   Hyperlipidemia Cholesterol is at goal on the current lipid regimen. No changes to the medications were made.    Total encounter time more than 25 minutes  Greater than 50% was spent in counseling and coordination of care with the patient   Signed, Ida Rogue, [place] [facility] [phone], [place], [place] [address]

## 2020-06-21 NOTE — Telephone Encounter (Signed)
Patient requested refill

## 2020-06-24 ENCOUNTER — Other Ambulatory Visit: Payer: Self-pay

## 2020-06-24 ENCOUNTER — Ambulatory Visit: Payer: Medicare HMO | Admitting: Cardiovascular Disease

## 2020-06-24 ENCOUNTER — Encounter: Payer: Self-pay | Admitting: Cardiovascular Disease

## 2020-06-24 VITALS — BP 100/60 | HR 66 | Ht 73.0 in | Wt 212.0 lb

## 2020-06-24 DIAGNOSIS — I471 Supraventricular tachycardia: Secondary | ICD-10-CM | POA: Diagnosis not present

## 2020-06-24 DIAGNOSIS — Z720 Tobacco use: Secondary | ICD-10-CM

## 2020-06-24 DIAGNOSIS — I25118 Atherosclerotic heart disease of native coronary artery with other forms of angina pectoris: Secondary | ICD-10-CM | POA: Diagnosis not present

## 2020-06-24 DIAGNOSIS — I10 Essential (primary) hypertension: Secondary | ICD-10-CM | POA: Diagnosis not present

## 2020-06-24 DIAGNOSIS — I209 Angina pectoris, unspecified: Secondary | ICD-10-CM | POA: Diagnosis not present

## 2020-06-24 DIAGNOSIS — Z9989 Dependence on other enabling machines and devices: Secondary | ICD-10-CM | POA: Diagnosis not present

## 2020-06-24 DIAGNOSIS — G4733 Obstructive sleep apnea (adult) (pediatric): Secondary | ICD-10-CM | POA: Diagnosis not present

## 2020-06-24 DIAGNOSIS — I479 Paroxysmal tachycardia, unspecified: Secondary | ICD-10-CM | POA: Diagnosis not present

## 2020-06-24 DIAGNOSIS — E782 Mixed hyperlipidemia: Secondary | ICD-10-CM

## 2020-06-24 DIAGNOSIS — I255 Ischemic cardiomyopathy: Secondary | ICD-10-CM

## 2020-06-24 NOTE — Patient Instructions (Addendum)
Medication Instructions:  No changes  If you need a refill on your cardiac medications before your next appointment, please call your pharmacy.    Lab work: No new labs needed   If you have labs (blood work) drawn today and your tests are completely normal, you will receive your results only by: Marland Kitchen MyChart Message (if you have MyChart) OR . A paper copy in the mail If you have any lab test that is abnormal or we need to change your treatment, we will call you to review the results.   Testing/Procedures: Leane Call for angina, known CAD [place]  Your caregiver has ordered a Stress Test with nuclear imaging. The purpose of this test is to evaluate the blood supply to your heart muscle. This procedure is referred to as a "Non-Invasive Stress Test." This is because other than having an IV started in your vein, nothing is inserted or "invades" your body. Cardiac stress tests are done to find areas of poor blood flow to the heart by determining the extent of coronary artery disease (CAD). Some patients exercise on a treadmill, which naturally increases the blood flow to your heart, while others who are  unable to walk on a treadmill due to physical limitations have a pharmacologic/chemical stress agent called Lexiscan . This medicine will mimic walking on a treadmill by temporarily increasing your coronary blood flow.   Please note: these test may take anywhere between 2-4 hours to complete  PLEASE REPORT TO [place] AT THE FIRST DESK WILL DIRECT YOU WHERE TO GO  Date of Procedure:_____________________________________  Arrival Time for Procedure:______________________________  Instructions regarding medication:   __XX__ : Hold Metoprolol, Propranolol, Spironolactone, Hydrochlorothiazide (HCTZ) the night before and morning of your procedure.      PLEASE NOTIFY THE OFFICE AT LEAST 43 HOURS IN ADVANCE IF YOU ARE UNABLE TO KEEP YOUR APPOINTMENT.   [phone] AND  PLEASE NOTIFY NUCLEAR MEDICINE AT [facility] AT LEAST 24 HOURS IN ADVANCE IF YOU ARE UNABLE TO KEEP YOUR APPOINTMENT. [phone]  How to prepare for your Myoview test:  1. Do not eat or drink after midnight 2. No caffeine for 24 hours prior to test 3. No smoking 24 hours prior to test. 4. Your medication may be taken with water.  If your doctor stopped a medication because of this test, do not take that medication. 5. Ladies, please do not wear dresses.  Skirts or pants are appropriate. Please wear a short sleeve shirt. 6. No perfume, cologne or lotion. 7. Wear comfortable walking shoes. No heels!    Follow-Up: At [facility], you and your health needs are our priority.  As part of our continuing mission to provide you with exceptional heart care, we have created designated Provider Care Teams.  These Care Teams include your primary Cardiologist (physician) and Advanced Practice Providers (APPs -  Physician Assistants and Nurse Practitioners) who all work together to provide you with the care you need, when you need it.  . You will need a follow up appointment in 12 months  . Providers on your designated Care Team:   . Murray Hodgkins, NP . Christell Faith, PA-C . Marrianne Mood, PA-C  Any Other Special Instructions Will Be Listed Below (If Applicable).  COVID-19 Vaccine Information can be found at: ShippingScam.co.uk For questions related to vaccine distribution or appointments, please email [email or web address] or call [phone].

## 2020-07-02 ENCOUNTER — Telehealth: Payer: Self-pay | Admitting: Cardiovascular Disease

## 2020-07-02 NOTE — Telephone Encounter (Signed)
Spoke with patient and he wants to wait for his testing stating that it was not needed and he can wait until next appointment. He states that this testing was per his request and that Dr. Rockey Situ was comfortable with how things were so after discussing with his daughter he would like to wait until follow up next year to determine if further testing is needed. Advised that I would make provider aware and to please give me a call if he should decide to have done before upcoming appointment. He verbalized understanding with no further questions at this time.

## 2020-07-02 NOTE — Telephone Encounter (Signed)
Patient cancelled and declined nuc med stress test and will readdress at next years ov   Cancelled order and closed referral.

## 2020-07-08 ENCOUNTER — Other Ambulatory Visit: Payer: Medicare HMO

## 2020-08-12 DIAGNOSIS — S025XXA Fracture of tooth (traumatic), initial encounter for closed fracture: Secondary | ICD-10-CM | POA: Diagnosis not present

## 2020-08-20 DIAGNOSIS — Z0100 Encounter for examination of eyes and vision without abnormal findings: Secondary | ICD-10-CM | POA: Diagnosis not present

## 2020-08-26 ENCOUNTER — Other Ambulatory Visit: Payer: Self-pay

## 2020-08-26 ENCOUNTER — Ambulatory Visit: Payer: Medicare HMO | Admitting: Dermatology

## 2020-08-26 DIAGNOSIS — L578 Other skin changes due to chronic exposure to nonionizing radiation: Secondary | ICD-10-CM | POA: Diagnosis not present

## 2020-08-26 DIAGNOSIS — L570 Actinic keratosis: Secondary | ICD-10-CM | POA: Diagnosis not present

## 2020-08-26 DIAGNOSIS — C44719 Basal cell carcinoma of skin of left lower limb, including hip: Secondary | ICD-10-CM | POA: Diagnosis not present

## 2020-08-26 DIAGNOSIS — D485 Neoplasm of uncertain behavior of skin: Secondary | ICD-10-CM

## 2020-08-26 NOTE — Progress Notes (Signed)
Follow-Up Visit   Subjective  Mitchell Sanderfer. is a 74 y.o. male who presents for the following: Actinic Keratosis (check the face for  any new or persistent skin lesions on the face) and Basal Cell Carcinoma (of the L prox med pretibial - patient is here today for treatment with [facility] ).  The following portions of the chart were reviewed this encounter and updated as appropriate:  Tobacco  Allergies  Meds  Problems  Med Hx  Surg Hx  Fam Hx     Review of Systems:  No other skin or systemic complaints except as noted in HPI or Assessment and Plan.  Objective  Well appearing patient in no apparent distress; mood and affect are within normal limits.  A focused examination was performed including the face and left leg. Relevant physical exam findings are noted in the Assessment and Plan.  Objective  L prox med pretibial: Healing biopsy site.  Objective  Face (4): Erythematous thin papules/macules with gritty scale.   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 including hip L prox med pretibial  Destruction of lesion Complexity: extensive   Destruction method: electrodesiccation and curettage   Informed consent: discussed and consent obtained   Timeout:  patient name, date of birth, surgical site, and procedure verified Procedure prep:  Patient was prepped and draped in usual sterile fashion Prep type:  Isopropyl alcohol Anesthesia: the lesion was anesthetized in a standard fashion   Anesthetic:  1% lidocaine w/ epinephrine 1-100,000 buffered w/ 8.4% NaHCO3 Curettage performed in three different directions: Yes   Electrodesiccation performed over the curetted area: Yes   Lesion length (cm):  1 Lesion width (cm):  1 Margin per side (cm):  0.2 Final wound size (cm):  1.4 Hemostasis achieved with:  pressure and aluminum chloride Outcome: patient tolerated procedure well  with no complications   Post-procedure details: sterile dressing applied and wound care instructions given   Dressing type: bandage and petrolatum    AK (actinic keratosis) (4) Face  Destruction of lesion - Face Complexity: simple   Destruction method: cryotherapy   Informed consent: discussed and consent obtained   Timeout:  patient name, date of birth, surgical site, and procedure verified Lesion destroyed using liquid nitrogen: Yes   Region frozen until ice ball extended beyond lesion: Yes   Outcome: patient tolerated procedure well with no complications   Post-procedure details: wound care instructions given    Neoplasm of uncertain behavior of skin Below the L med knee  Epidermal / dermal shaving  Lesion diameter (cm):  1.5 Informed consent: discussed and consent obtained   Timeout: patient name, date of birth, surgical site, and procedure verified   Procedure prep:  Patient was prepped and draped in usual sterile fashion Prep type:  Isopropyl alcohol Anesthesia: the lesion was anesthetized in a standard fashion   Anesthetic:  1% lidocaine w/ epinephrine 1-100,000 buffered w/ 8.4% NaHCO3 Instrument used: flexible razor blade   Hemostasis achieved with: pressure, aluminum chloride and electrodesiccation   Outcome: patient tolerated procedure well   Post-procedure details: sterile dressing applied and wound care instructions given   Dressing type: bandage and petrolatum    Destruction of lesion Complexity: extensive   Destruction method: electrodesiccation and curettage   Informed consent: discussed and consent obtained   Timeout:  patient name, date of birth, surgical site, and procedure verified Procedure  prep:  Patient was prepped and draped in usual sterile fashion Prep type:  Isopropyl alcohol Anesthesia: the lesion was anesthetized in a standard fashion   Anesthetic:  1% lidocaine w/ epinephrine 1-100,000 buffered w/ 8.4% NaHCO3 Curettage performed in three  different directions: Yes   Electrodesiccation performed over the curetted area: Yes   Lesion length (cm):  1.5 Lesion width (cm):  1.5 Margin per side (cm):  0.2 Final wound size (cm):  1.9 Hemostasis achieved with:  pressure, aluminum chloride and electrodesiccation Outcome: patient tolerated procedure well with no complications   Post-procedure details: sterile dressing applied and wound care instructions given   Dressing type: bandage and petrolatum    Specimen 1 - Surgical pathology Differential Diagnosis: D48.5 r/o BCC ED&C today  Check Margins: No 1.5 cm pink patch   Actinic Damage - chronic, secondary to cumulative UV radiation exposure/sun exposure over time - diffuse scaly erythematous macules with underlying dyspigmentation - Recommend daily broad spectrum sunscreen SPF 30+ to sun-exposed areas, reapply every 2 hours as needed.  - Call for new or changing lesions.  Return in about 6 months (around 02/23/2021) for AK follow up.  Luther Redo, CMA, am acting as scribe for Sarina Ser, MD .  Documentation: I have reviewed the above documentation for accuracy and completeness, and I agree with the above.  Sarina Ser, MD

## 2020-08-26 NOTE — Patient Instructions (Signed)

## 2020-08-28 ENCOUNTER — Telehealth: Payer: Self-pay

## 2020-08-28 NOTE — Telephone Encounter (Signed)
-----   Message from Ralene Bathe, MD sent at 08/27/2020  6:47 PM EST ----- Diagnosis Skin , below the L med knee SUPERFICIAL BASAL CELL CARCINOMA  Cancer - [place] Already treated Recheck next visit

## 2020-08-28 NOTE — Telephone Encounter (Signed)
Patient informed of pathology results 

## 2020-09-03 ENCOUNTER — Encounter: Payer: Self-pay | Admitting: Dermatology

## 2020-09-09 ENCOUNTER — Other Ambulatory Visit: Payer: Self-pay

## 2020-09-09 MED ORDER — LOSARTAN POTASSIUM 25 MG PO TABS: 25.0000 mg | ORAL_TABLET | Freq: Every day | ORAL | 0 refills | Status: DC

## 2020-09-09 NOTE — Telephone Encounter (Signed)
*  STAT* If patient is at the pharmacy, call can be transferred to refill team.   1. Which medications need to be refilled? (please list name of each medication and dose if known) [place]ARTAN  2. Which pharmacy/location (including street and city if local pharmacy) is medication to be sent to? [facility] [address]  3. Do they need a 30 day or 90 day supply? [place]

## 2020-09-10 ENCOUNTER — Other Ambulatory Visit: Payer: Medicare HMO

## 2020-09-10 ENCOUNTER — Other Ambulatory Visit: Payer: Self-pay

## 2020-09-10 DIAGNOSIS — Z0000 Encounter for general adult medical examination without abnormal findings: Secondary | ICD-10-CM | POA: Diagnosis not present

## 2020-09-10 DIAGNOSIS — I10 Essential (primary) hypertension: Secondary | ICD-10-CM

## 2020-09-10 DIAGNOSIS — Z1159 Encounter for screening for other viral diseases: Secondary | ICD-10-CM | POA: Diagnosis not present

## 2020-09-11 LAB — BASIC METABOLIC PANEL
BUN/Creatinine Ratio: 21 (calc) (ref 6–22)
BUN: 30 mg/dL — ABNORMAL HIGH (ref 7–25)
CO2: 21 mmol/L (ref 20–32)
Calcium: 9.1 mg/dL (ref 8.6–10.3)
Chloride: 102 mmol/L (ref 98–110)
Creat: 1.44 mg/dL — ABNORMAL HIGH (ref 0.70–1.18)
Glucose, Bld: 96 mg/dL (ref 65–99)
Potassium: 4 mmol/L (ref 3.5–5.3)
Sodium: 135 mmol/L (ref 135–146)

## 2020-09-11 LAB — HEPATITIS C ANTIBODY
Hepatitis C Ab: NONREACTIVE
SIGNAL TO CUT-OFF: 0.01 (ref <1.00)

## 2020-09-11 NOTE — Progress Notes (Signed)
Hepatitis C screen is negative. Kidney function suggests some dehydration at time of labs.  Discuss at appt.

## 2020-09-12 ENCOUNTER — Ambulatory Visit (INDEPENDENT_AMBULATORY_CARE_PROVIDER_SITE_OTHER): Payer: Medicare HMO | Admitting: Internal Medicine

## 2020-09-12 ENCOUNTER — Other Ambulatory Visit: Payer: Self-pay

## 2020-09-12 ENCOUNTER — Encounter: Payer: Self-pay | Admitting: Internal Medicine

## 2020-09-12 VITALS — BP 118/78 | HR 60 | Temp 97.1°F | Ht 73.0 in | Wt 213.8 lb

## 2020-09-12 DIAGNOSIS — M21372 Foot drop, left foot: Secondary | ICD-10-CM

## 2020-09-12 DIAGNOSIS — R739 Hyperglycemia, unspecified: Secondary | ICD-10-CM | POA: Diagnosis not present

## 2020-09-12 DIAGNOSIS — I25110 Atherosclerotic heart disease of native coronary artery with unstable angina pectoris: Secondary | ICD-10-CM

## 2020-09-12 DIAGNOSIS — I10 Essential (primary) hypertension: Secondary | ICD-10-CM | POA: Diagnosis not present

## 2020-09-12 DIAGNOSIS — I429 Cardiomyopathy, unspecified: Secondary | ICD-10-CM | POA: Diagnosis not present

## 2020-09-12 DIAGNOSIS — Z87891 Personal history of nicotine dependence: Secondary | ICD-10-CM | POA: Diagnosis not present

## 2020-09-12 DIAGNOSIS — G8929 Other chronic pain: Secondary | ICD-10-CM | POA: Diagnosis not present

## 2020-09-12 DIAGNOSIS — E782 Mixed hyperlipidemia: Secondary | ICD-10-CM | POA: Diagnosis not present

## 2020-09-12 DIAGNOSIS — M5442 Lumbago with sciatica, left side: Secondary | ICD-10-CM | POA: Diagnosis not present

## 2020-09-12 DIAGNOSIS — F324 Major depressive disorder, single episode, in partial remission: Secondary | ICD-10-CM

## 2020-09-12 DIAGNOSIS — R69 Illness, unspecified: Secondary | ICD-10-CM | POA: Diagnosis not present

## 2020-09-12 NOTE — Progress Notes (Signed)
Location:  [facility] clinic Provider:  Monty Spicher L. Mariea Clonts, D.O., C.M.D.  Goals of Care:  Advanced Directives 09/12/2020  Does Patient Have a Medical Advance Directive? Yes  Type of Paramedic of [place];Living will  Does patient want to make changes to medical advance directive? No - Guardian declined  Copy of [place] in Chart? Yes - validated most recent copy scanned in chart (See row information)  Would patient like information on creating a medical advance directive? -     Chief Complaint  Patient presents with  . Medical Management of Chronic Issues    6 month follow up   . Health Maintenance    Colonoscopy    HPI: Patient is a 74 y.o. male seen today for medical management of chronic diseases.    BP for him is higher. He's going to see his favorite cousin who is nearing end of life.  He says he's losing control of his left leg--feels like it's slapping from neuropathy.  He also struggles to get his left leg up to cross it.  Has to pull his pant leg.  It's not changed much.  His right foot used to do it.  He also had no reflex in that knee.  The back itself is ok.  It's the hip that's a little different.  He will tell me if that gets worse.  Knows his balance is not as good--wonders if it's his glasses.  Has verilux lenses.    Sinuses have been terrible.  Wonders if he has an infection.  Left side has yellow mucus.  He's had drainage.  No fever, chills.  Has some septal deviation.    More short-winded than before.  Not changing.  He does his "required" 180 mins weekly--does 5 days per week.  Pace can be under 15 min miles if in [place] at the beach.  Not pushing hard.  Says steps are shorter.  Uses his fitbit.  He's taken 1-2 ntg in the last 6 months.  Ntg worked.  Mood is stable and ok.  Not having dark periods.  He's tired of virtual AA, bu the does them 2-3 per week.    They're going back to the beach after Christmas.  2  concerns:   Has new doctor, Dr. Tarri Glenn, re: colonoscopy.  She wanted him to see her cardiologist first.  He had a bad polyp, then he had a clean cscope after that, then he had one that was "OK" but it was not a great prep.  Reviewed that AWV from there showed he should call if he didn't hear about scheduling.  Other was whether we wanted to repeat his annual CT chest due to his 20 yrs of smoking.  So far has been ok though he had a kidney stone to show up incidentally last year and he wonders if he ever passed it.    Past Medical History:  Diagnosis Date  . Alcoholism ([place])    per pt  . Basal cell carcinoma 05/13/2020   left prox medial pretibial   . Basal cell carcinoma 08/26/2020   below the left medial knee - tx with ED&C   . Coronary artery disease    a. 06/2017 MV: EF 45%, large inf and apical defect w/o ischemia;  b. 06/2017 Cath: LM nl, LAD 30ost, LCX 50p, OM1/2 ok, RCA 100p w/ L->R collats. EF 35%.  . Depression with anxiety   . Gastric ulcer, acute with hemorrhage 2001  Dr. Sharlet Salina, [facility]  . H/O congenital atrial septal defect (ASD) repair   . Helicobacter pylori gastritis   . Heme positive stool   . History of gout   . History of peptic ulcer disease    had bleeding ulcers requiring transfusion  . History of PSVT (paroxysmal supraventricular tachycardia)   . Hyperlipidemia   . Hypertension   . Ischemic cardiomyopathy    a. 06/2017 LV gram: EF 35%.  . Obstructive sleep apnea    on CPAP  . Perennial allergic rhinitis   . 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 Tobacco abuse     Past Surgical History:  Procedure Laterality Date  . ASD REPAIR, OSTIUM PRIMUM  1966  . EAR BIOPSY Left 2013   Dr.Cook--squamous cell ca ([facility])  . LEFT HEART CATH AND CORONARY ANGIOGRAPHY Left 06/30/2017   Procedure: LEFT HEART CATH AND CORONARY ANGIOGRAPHY;  Surgeon: Minna Merritts, MD;  Location: [facility];  Service: Cardiovascular;  Laterality: Left;  .  MOHS SURGERY     ear  . open heart surgery  1966   Dr. Annamaria Boots ([facility]); was PFO repair--has been normal in f/u  . PALATE / UVULA BIOPSY / EXCISION  2000   Dr.Sprenhe [facility])  . TONSILLECTOMY AND ADENOIDECTOMY  1959    Allergies  Allergen Reactions  . Pollen Extract Other (See Comments)    Stuffy nose/itchy throat (Seasonal allergies)    Outpatient Encounter Medications as of 09/12/2020  Medication Sig  . aspirin EC 81 MG tablet Take 81 mg by mouth daily.  Marland Kitchen buPROPion (WELLBUTRIN XL) 300 MG 24 hr tablet Take 1 tablet (300 mg total) by mouth daily.  Marland Kitchen ezetimibe (ZETIA) 10 MG tablet Take 1 tablet (10 mg total) by mouth daily.  . hydrochlorothiazide (HYDRODIURIL) 25 MG tablet Take 25 mg by mouth daily as needed.  . isosorbide mononitrate (IMDUR) 30 MG 24 hr tablet TAKE 0.5 TABLETS (15 MG TOTAL) BY MOUTH DAILY.  Marland Kitchen losartan (COZAAR) 25 MG tablet Take 1 tablet (25 mg total) by mouth daily.  . metoprolol succinate (TOPROL-XL) 100 MG 24 hr tablet TAKE 1 TABLET BY MOUTH EVERY DAY  . nitroGLYCERIN (NITROSTAT) 0.4 MG SL tablet PLACE 1 TABLET (0.4 MG TOTAL) UNDER THE TONGUE EVERY 5 (FIVE) MINUTES AS NEEDED FOR CHEST PAIN.  Marland Kitchen propranolol (INDERAL) 20 MG tablet TAKE 1 TABLET (20 MG TOTAL) BY MOUTH AS NEEDED (AS NEEDED FOR FAST HEART RATES).  Marland Kitchen senna (SENOKOT) 8.6 MG tablet Take 2 tablets by mouth at bedtime.  . simvastatin (ZOCOR) 40 MG tablet Take 1 tablet (40 mg total) by mouth daily.  Marland Kitchen spironolactone (ALDACTONE) 25 MG tablet Take 1 tablet (25 mg total) by mouth daily.  . traZODone (DESYREL) 50 MG tablet TAKE 1 TABLET (50 MG TOTAL) BY MOUTH AT BEDTIME AS NEEDED. FOR INSOMNIA   No facility-administered encounter medications on file as of 09/12/2020.    Review of Systems:  Review of Systems  Constitutional: Negative for chills, fever and malaise/fatigue.  HENT: Negative for congestion and sore throat.   Eyes: Negative for blurred vision.       Glasses  Respiratory: Negative for cough and  shortness of breath.   Cardiovascular: Negative for chest pain, palpitations, orthopnea, leg swelling and PND.  Gastrointestinal: Negative for abdominal pain, blood in stool, constipation, heartburn, melena, nausea and vomiting.  Genitourinary: Negative for dysuria.  Musculoskeletal: Positive for back pain. Negative for falls and joint pain.  Skin: Negative  for itching and rash.  Neurological: Positive for tingling and sensory change. Negative for loss of consciousness.       Left foot drop he notes now  Psychiatric/Behavioral: Positive for depression. Negative for memory loss. The patient is not nervous/anxious and does not have insomnia.        In remission    Health Maintenance  Topic Date Due  . COLONOSCOPY  03/22/2019  . TETANUS/TDAP  09/11/2024 (Originally 07/31/1965)  . COVID-19 Vaccine (4 - Booster for Moderna series) 01/08/2021  . INFLUENZA VACCINE  Completed  . Hepatitis C Screening  Completed  . PNA vac Low Risk Adult  Completed    Physical Exam: Vitals:   09/12/20 0919  BP: 118/78  Pulse: 60  Temp: (!) 97.1 F (36.2 C)  TempSrc: Temporal  SpO2: 97%  Weight: 213 lb 12.8 oz (97 kg)  Height: 6\' 1"  (1.854 m)   Body mass index is 28.21 kg/m. Physical Exam Vitals reviewed.  Constitutional:      General: He is not in acute distress.    Appearance: He is not toxic-appearing.  HENT:     Head: Normocephalic and atraumatic.     Nose: Congestion present.     Comments: Left sinus    Mouth/Throat:     Mouth: Mucous membranes are dry.     Pharynx: Oropharyngeal exudate present.  Eyes:     Comments: glasses  Cardiovascular:     Rate and Rhythm: Normal rate and regular rhythm.     Heart sounds: No murmur heard.   Pulmonary:     Effort: Pulmonary effort is normal.     Breath sounds: Normal breath sounds. No wheezing, rhonchi or rales.  Abdominal:     General: Bowel sounds are normal. There is no distension.     Tenderness: There is no abdominal tenderness. There  is no guarding or rebound.  Musculoskeletal:        General: Normal range of motion.     Cervical back: Neck supple.     Right lower leg: No edema.     Left lower leg: No edema.     Comments: Left SI region tenderness and decreased left hip ROM  Lymphadenopathy:     Cervical: No cervical adenopathy.  Skin:    General: Skin is warm and dry.  Neurological:     General: No focal deficit present.     Mental Status: He is alert and oriented to person, place, and time.     Cranial Nerves: No cranial nerve deficit.     Motor: No weakness.     Gait: Gait normal.  Psychiatric:        Mood and Affect: Mood normal.     Labs reviewed: Basic Metabolic Panel: Recent Labs    04/01/20 0843 09/10/20 0922  NA 136 135  K 3.9 4.0  CL 102 102  CO2 26 21  GLUCOSE 85 96  BUN 22 30*  CREATININE 1.33* 1.44*  CALCIUM 9.3 9.1   Liver Function Tests: Recent Labs    04/01/20 0843  AST 14  ALT 13  BILITOT 0.6  PROT 6.0*   No results for input(s): LIPASE, AMYLASE in the last 8760 hours. No results for input(s): AMMONIA in the last 8760 hours. CBC: Recent Labs    04/01/20 0843  WBC 7.1  NEUTROABS 4,452  HGB 13.1*  HCT 38.6  MCV 93.5  PLT 232   Lipid Panel: Recent Labs    04/01/20 0843  CHOL 114  HDL 47  LDLCALC 49  TRIG 97  CHOLHDL 2.4   Lab Results  Component Value Date   HGBA1C 5.7 (H) 04/01/2020    Procedures since last visit: No results found.  Assessment/Plan 1. Cardiomyopathy, unspecified type ([place]) -remains ongoing, f/u with cardiology, Dr. Rockey Situ as planned  2. Depression, major, single episode, in partial remission ([place]) -has some chronic low grade depression, but managing better recently and feeling more mellow with age  17. History of smoking 10-25 pack years - agreeable to f/u CT - CT CHEST NODULE FOLLOW UP LOW DOSE W/O; Future  4. Coronary artery disease involving native coronary artery of native heart with unstable angina pectoris ([place]) -continues  to occasionally have episode of chest pain (2 in 6 mos)  5. Essential hypertension, benign -bp well controlled, no reports of dizziness  6. Mixed hyperlipidemia -LDL at goal with current regimen, cont same statin, walking and diet and monitor  7. Chronic midline low back pain with left-sided sciatica -seems his back is the underlying culprit--he will let me know if he notes more foot drop (he'd declined an MRI when his back was considerably more bothersome in the past and his insurance also did not plan to cover it then)  8. Left foot drop -suspect due to #7 -concerning if this worsens -DTRs intact bilaterally today and no red flags  Labs/tests ordered:   Lab Orders     CBC with Differential/Platelet     COMPLETE METABOLIC PANEL WITH GFR     Lipid panel     Hemoglobin A1c   Next appt:  6 mos CPE, fasting labs before   Ekta Dancer L. Fredia Chittenden, D.O. [facility] [address], [place] [address] Cell Phone (Mon-Fri 8am-5pm):  [phone] On Call:  [phone] & follow prompts after 5pm & weekends Office Phone:  [phone] Office Fax:  [phone]

## 2020-09-13 ENCOUNTER — Telehealth: Payer: Self-pay | Admitting: Gastroenterology

## 2020-09-13 ENCOUNTER — Telehealth: Payer: Self-pay

## 2020-09-13 ENCOUNTER — Ambulatory Visit (INDEPENDENT_AMBULATORY_CARE_PROVIDER_SITE_OTHER): Payer: Medicare HMO | Admitting: Nurse Practitioner

## 2020-09-13 ENCOUNTER — Encounter: Payer: Self-pay | Admitting: Family

## 2020-09-13 DIAGNOSIS — Z0000 Encounter for general adult medical examination without abnormal findings: Secondary | ICD-10-CM

## 2020-09-13 NOTE — Telephone Encounter (Signed)
Ammie,  I returned call to pt. He called to inform us that he has recently been seen by cardiology and would like for [place] to get clearance before he will schedule his colonoscopy. He was seen in 04/2020 by Dr. Tarri Glenn- you will see her note. I have sent request to his cardiologist asking for clearance. He is wondering if the risk out weigh the benefits, but wanted to see if his cardiologist agreed before even scheduling. I have sent request and told pt that we would contact him once we received clearance back from his cardiologist.

## 2020-09-13 NOTE — Progress Notes (Signed)
Subjective:   Mitchell Helmers. is a 74 y.o. male who presents for Medicare Annual/Subsequent preventive examination.  Review of Systems     Cardiac Risk Factors include: family history of premature cardiovascular disease;hypertension;dyslipidemia;male gender;advanced age (>12men, >34 women)     Objective:    There were no vitals filed for this visit. There is no height or weight on file to calculate BMI.  Advanced Directives 09/13/2020 09/12/2020 04/04/2020 09/12/2019 09/07/2018 05/10/2018 04/14/2018  Does Patient Have a Medical Advance Directive? Yes Yes - Yes Yes No Yes  Type of Paramedic of [place];Living will [place];Living will Healthcare Power of [place];Living will [place];Living will - Living will  Does patient want to make changes to medical advance directive? No - Patient declined No - Guardian declined No - Guardian declined No - Patient declined No - Patient declined - -  Copy of [place] in Chart? Yes - validated most recent copy scanned in chart (See row information) Yes - validated most recent copy scanned in chart (See row information) Yes - validated most recent copy scanned in chart (See row information) Yes - validated most recent copy scanned in chart (See row information) Yes - validated most recent copy scanned in chart (See row information) - -  Would patient like information on creating a medical advance directive? - - - - - No - Patient declined -    Current Medications (verified) Outpatient Encounter Medications as of 09/13/2020  Medication Sig  . aspirin EC 81 MG tablet Take 81 mg by mouth daily.  Marland Kitchen buPROPion (WELLBUTRIN XL) 300 MG 24 hr tablet Take 1 tablet (300 mg total) by mouth daily.  Marland Kitchen ezetimibe (ZETIA) 10 MG tablet Take 1 tablet (10 mg total) by mouth daily.  . hydrochlorothiazide (HYDRODIURIL) 25 MG tablet Take 25 mg by mouth daily as  needed.  . isosorbide mononitrate (IMDUR) 30 MG 24 hr tablet TAKE 0.5 TABLETS (15 MG TOTAL) BY MOUTH DAILY.  Marland Kitchen losartan (COZAAR) 25 MG tablet Take 1 tablet (25 mg total) by mouth daily.  . metoprolol succinate (TOPROL-XL) 100 MG 24 hr tablet TAKE 1 TABLET BY MOUTH EVERY DAY  . nitroGLYCERIN (NITROSTAT) 0.4 MG SL tablet PLACE 1 TABLET (0.4 MG TOTAL) UNDER THE TONGUE EVERY 5 (FIVE) MINUTES AS NEEDED FOR CHEST PAIN.  Marland Kitchen propranolol (INDERAL) 20 MG tablet TAKE 1 TABLET (20 MG TOTAL) BY MOUTH AS NEEDED (AS NEEDED FOR FAST HEART RATES).  Marland Kitchen senna (SENOKOT) 8.6 MG tablet Take 2 tablets by mouth at bedtime.  . simvastatin (ZOCOR) 40 MG tablet Take 1 tablet (40 mg total) by mouth daily.  Marland Kitchen spironolactone (ALDACTONE) 25 MG tablet Take 1 tablet (25 mg total) by mouth daily.  . traZODone (DESYREL) 50 MG tablet TAKE 1 TABLET (50 MG TOTAL) BY MOUTH AT BEDTIME AS NEEDED. FOR INSOMNIA   No facility-administered encounter medications on file as of 09/13/2020.    Allergies (verified) Pollen extract   History: Past Medical History:  Diagnosis Date  . Alcoholism ([place])    per pt  . Basal cell carcinoma 05/13/2020   left prox medial pretibial   . Basal cell carcinoma 08/26/2020   below the left medial knee - tx with ED&C   . Coronary artery disease    a. 06/2017 MV: EF 45%, large inf and apical defect w/o ischemia;  b. 06/2017 Cath: LM nl, LAD 30ost, LCX 50p, OM1/2 ok, RCA 100p  w/ L->R collats. EF 35%.  . Depression with anxiety   . Gastric ulcer, acute with hemorrhage 2001   Dr. Sharlet Salina, [facility]  . H/O congenital atrial septal defect (ASD) repair   . Helicobacter pylori gastritis   . Heme positive stool   . History of gout   . History of peptic ulcer disease    had bleeding ulcers requiring transfusion  . History of PSVT (paroxysmal supraventricular tachycardia)   . Hyperlipidemia   . Hypertension   . Ischemic cardiomyopathy    a. 06/2017 LV gram: EF 35%.  . Obstructive sleep apnea    on CPAP  .  Perennial allergic rhinitis   . 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 Tobacco abuse    Past Surgical History:  Procedure Laterality Date  . ASD REPAIR, OSTIUM PRIMUM  1966  . EAR BIOPSY Left 2013   Dr.Cook--squamous cell ca ([facility])  . LEFT HEART CATH AND CORONARY ANGIOGRAPHY Left 06/30/2017   Procedure: LEFT HEART CATH AND CORONARY ANGIOGRAPHY;  Surgeon: Minna Merritts, MD;  Location: [place] [facility];  Service: Cardiovascular;  Laterality: Left;  . MOHS SURGERY     ear  . open heart surgery  1966   Dr. Annamaria Boots ([facility]); was PFO repair--has been normal in f/u  . PALATE / UVULA BIOPSY / EXCISION  2000   Dr.Sprenhe [facility])  . TONSILLECTOMY AND ADENOIDECTOMY  1959   Family History  Problem Relation Age of Onset  . Failure to thrive Mother   . Arthritis Mother   . Brain cancer Father   . Hypertension Sister   . Stomach cancer Maternal Grandmother   . Cancer Paternal Grandfather        type unknown  . Liver cancer Paternal Uncle   . Other Other        mother's siblings died of cancer ( abdominal)   Social History   Socioeconomic History  . Marital status: Married    Spouse name: Not on file  . Number of children: 1  . Years of education: Not on file  . Highest education level: Not on file  Occupational History  . Occupation: retired  Tobacco Use  . Smoking status: Former Smoker    Years: 40.00    Quit date: 09/04/2015    Years since quitting: 5.0  . Smokeless tobacco: Never Used  Vaping Use  . Vaping Use: Never used  Substance and Sexual Activity  . Alcohol use: No    Alcohol/week: 0.0 standard drinks    Comment: previously did drink alcohol heavily  . Drug use: No    Comment: likes to avoid pain meds, no illicit drugs used  . Sexual activity: Yes  Other Topics Concern  . Not on file  Social History Narrative   Drinks caffeine   Married in 1973   Lives in house   Two story home, 2 live in home   [place] and does  yardwork   Has living will and [employer] [facility]   Social Determinants of Health   Financial Resource Strain: Not on file  Food Insecurity: Not on file  Transportation Needs: Not on file  Physical Activity: Not on file  Stress: Not on file  Social Connections: Not on file    Tobacco Counseling Counseling given: Not Answered   Clinical Intake:  Pre-visit preparation completed: Yes  Pain : No/denies pain     BMI - recorded: 28 Diabetes: No  How often do  you need to have someone help you when you read instructions, pamphlets, or other written materials from your doctor or pharmacy?: 1 - Never  Diabetic?no        Activities of Daily Living In your present state of health, do you have any difficulty performing the following activities: 09/13/2020  Hearing? N  Vision? N  Difficulty concentrating or making decisions? N  Walking or climbing stairs? N  Dressing or bathing? N  Doing errands, shopping? N  Preparing Food and eating ? N  Using the Toilet? N  In the past six months, have you accidently leaked urine? N  Do you have problems with loss of bowel control? N  Managing your Medications? N  Managing your Finances? N  Housekeeping or managing your Housekeeping? N  Some recent data might be hidden    Patient Care Team: Gayland Curry, DO as PCP - General (Geriatric Medicine) [place], Peter M, MD as PCP - Cardiology (Cardiology) [name], [place] as Referring Physician (Dermatology) [name], MD as Referring Physician (Otolaryngology)  Indicate any recent Medical Services you may have received from other than [facility] providers in the past year (date may be approximate).     Assessment:   This is a routine wellness examination for Mitchell Stephens.  Hearing/Vision screen  Hearing Screening   125Hz  250Hz  500Hz  1000Hz  2000Hz  3000Hz  4000Hz  6000Hz  8000Hz   Right ear:           Left ear:           Comments: No Hearing Concerns.  Vision Screening Comments: No Vision Concerns.  Last eye exam October 2021 at [facility]  Dietary issues and exercise activities discussed: Current Exercise Habits: Home exercise routine, Type of exercise: walking, Time (Minutes): 30, Frequency (Times/Week): 5, Weekly Exercise (Minutes/Week): 150  Goals    .  Increase physical activity (pt-stated)      Patient will walk a couple miles a day once cardiac rehab is done.      Depression Screen PHQ 2/9 Scores 09/13/2020 09/12/2020 04/04/2020 09/25/2019 09/12/2019 06/01/2019 09/22/2018  PHQ - 2 Score 0 0 0 0 0 0 0  PHQ- 9 Score - - - - - - -    Fall Risk Fall Risk  09/13/2020 09/12/2020 04/04/2020 09/25/2019 09/12/2019  Falls in the past year? 0 0 0 0 0  Number falls in past yr: 0 0 0 0 0  Injury with Fall? 0 0 0 0 0  Comment - - - - -  Risk for fall due to : - - - - -  Follow up - - - - -    [place]:  Any stairs in or around the home? Yes  If so, are there any without handrails? No  Home free of loose throw rugs in walkways, pet beds, electrical cords, etc? Yes  Adequate lighting in your home to reduce risk of falls? Yes   ASSISTIVE DEVICES UTILIZED TO PREVENT FALLS:  Life alert? No  Use of a cane, walker or w/c? No  Grab bars in the bathroom? No  Shower chair or bench in shower? No  Elevated toilet seat or a handicapped toilet? No   TIMED UP AND GO:  Was the test performed? No .   Cognitive Function: MMSE - Mini Mental State Exam 09/07/2018 08/12/2017 08/07/2016 05/09/2015  Orientation to time 5 4 5 4   Orientation to Place 5 5 5 5   Registration 3 3 3 3   Attention/ Calculation  5 5 5 5   Recall 3 3 3 1   Language- name 2 objects 2 2 2 2   Language- repeat 1 1 1 1   Language- follow 3 step command 3 3 3 3   Language- read & follow direction 1 1 1 1   Write a sentence 1 1 1 1   Copy design 1 1 1 1   Total score 30 29 30 27      6CIT Screen 09/13/2020 09/12/2019  What Year? 0 points 0 points  What month? 0 points 0 points  What time? 0  points 0 points  Count back from 20 0 points 0 points  Months in reverse 0 points 0 points  Repeat phrase 0 points 0 points  Total Score 0 0    Immunizations Immunization History  Administered Date(s) Administered  . Fluad Quad(high Dose 65+) 06/01/2019, 06/07/2020  . Influenza, High Dose Seasonal PF 06/25/2017, 07/25/2018  . Influenza,inj,Quad PF,6+ Mos 08/27/2015, 07/10/2016  . Moderna SARS-COVID-2 Vaccination 10/27/2019, 11/24/2019, 07/10/2020  . Pneumococcal Conjugate-13 10/05/2013  . Pneumococcal Polysaccharide-23 08/07/2016  . Zoster Recombinat (Shingrix) 07/06/2017, 12/08/2017    TDAP status: Due, Education has been provided regarding the importance of this vaccine. Advised may receive this vaccine at local pharmacy or Health Dept. Aware to provide a copy of the vaccination record if obtained from local pharmacy or Health Dept. Verbalized acceptance and understanding.  Flu Vaccine status: Up to date  Pneumococcal vaccine status: Up to date  Covid-19 vaccine status: Completed vaccines  Qualifies for Shingles Vaccine? Yes   Zostavax completed No   Shingrix Completed?: Yes  Screening Tests Health Maintenance  Topic Date Due  . COLONOSCOPY  03/22/2019  . TETANUS/TDAP  09/11/2024 (Originally 07/31/1965)  . COVID-19 Vaccine (4 - Booster for Moderna series) 01/08/2021  . INFLUENZA VACCINE  Completed  . Hepatitis C Screening  Completed  . PNA vac Low Risk Adult  Completed    Health Maintenance  Health Maintenance Due  Topic Date Due  . COLONOSCOPY  03/22/2019    Colorectal cancer screening: Referral to GI placed and awaiting for call back. Pt aware the office will call re: appt.  Lung Cancer Screening: (Low Dose CT Chest recommended if Age 52-80 years, 30 pack-year currently smoking OR have quit w/in 15years.) does qualify.   Lung Cancer Screening Referral: done  Additional Screening:  Hepatitis C Screening: does qualify; Completed 09/10/20   Vision Screening:  Recommended annual ophthalmology exams for early detection of glaucoma and other disorders of the eye. Is the patient up to date with their annual eye exam?  Yes  Who is the provider or what is the name of the office in which the patient attends annual eye exams? [facility] If pt is not established with a provider, would they like to be referred to a provider to establish care? No .   Dental Screening: Recommended annual dental exams for proper oral hygiene  Community Resource Referral / Chronic Care Management: CRR required this visit?  No   CCM required this visit?  No      Plan:     I have personally reviewed and noted the following in the patient's chart:   . Medical and social history . Use of alcohol, tobacco or illicit drugs  . Current medications and supplements . Functional ability and status . Nutritional status . Physical activity . Advanced directives . List of other physicians . Hospitalizations, surgeries, and ER visits in previous 12 months . Vitals . Screenings to include cognitive, depression,  and falls . Referrals and appointments  In addition, I have reviewed and discussed with patient certain preventive protocols, quality metrics, and best practice recommendations. A written personalized care plan for preventive services as well as general preventive health recommendations were provided to patient.     [name], NP   09/13/2020    Virtual Visit via Telephone Note  I connected with pt on 09/13/20 at 10:00 AM EST by telephone and verified that I am speaking with the correct person using two identifiers.  Location: Patient: home Provider: [facility]   I discussed the limitations, risks, security and privacy concerns of performing an evaluation and management service by telephone and the availability of in person appointments. I also discussed with the patient that there may be a patient responsible charge related to this service. The patient  expressed understanding and agreed to proceed.   I discussed the assessment and treatment plan with the patient. The patient was provided an opportunity to ask questions and all were answered. The patient agreed with the plan and demonstrated an understanding of the instructions.   The patient was advised to call back or seek an in-person evaluation if the symptoms worsen or if the condition fails to improve as anticipated.  I provided 15 minutes of non-face-to-face time during this encounter.  Carlos American. Harle Battiest Avs printed and mailed

## 2020-09-13 NOTE — Progress Notes (Signed)
    This service is provided via telemedicine  No vital signs collected/recorded due to the encounter was a telemedicine visit.   Location of patient (ex: home, work): Home.   Patient consents to a telephone visit: Yes.  Location of the provider (ex: office, home):  [facility].  Name of any referring provider: Reed, Tiffany L, DO   Names of all persons participating in the telemedicine service and their role in the encounter: Patient, Mycheal Veldhuizen, RMA, Ngetich, Dinah, NP.    Time spent on call: 8 minutes spent on the phone with Medical Assistant.   

## 2020-09-13 NOTE — Telephone Encounter (Signed)
Request for surgical clearance:     Endoscopy Procedure  What type of surgery is being performed?     Colonoscopy  When is this surgery scheduled?    TBD  What type of clearance is required ?   Cardiac Clearance   Are there any medications that need to be held prior to surgery and how long? No medications. Asking for Cardiac Clearance prior to having colonscopy.   Practice name and name of physician performing surgery?      [facility]-Dr. Tarri Glenn   What is your office phone and fax number?      Phone- [phone]  [phone]  Anesthesia type (None, local, MAC, general) ?       MAC

## 2020-09-13 NOTE — Telephone Encounter (Signed)
Hi Dr. Rockey Situ,  Mitchell Stephens has an upcoming colonoscopy planned. You saw him in 06/2020 at which time he noted one episodes of jaw pain the month prior that resolved after 2 doses of Nitro. He denied any recurrent episodes. A stress myoview was ordered for further evaluation of this episode. However, patient ended up cancelling the Myoview. I called and spoke with patient today. He denies any episodes of chest pain since last visit. He denies any significant shortness of breath (only mild shortness of breath if he is walking quickly). He has been walking a couple miles at a time with no problems. No shortness of breath at rest. No orthopnea, PND, palpitations, dizziness, or syncope.  He states he cancelled his Myoview because he was under the mindset that "if it is not broken, don't fix it." Do you still feel like he needs a Myoview prior to this low risk procedure?  Please route response back to P CV DIV PREOP.  Thank you! Mitchell Stephens

## 2020-09-13 NOTE — Patient Instructions (Signed)
[name] , Thank you for taking time to come for your Medicare Wellness Visit. I appreciate your ongoing commitment to your health goals. Please review the following plan we discussed and let me know if I can assist you in the future.   Screening recommendations/referrals: Colonoscopy to schedule appt.  Recommended yearly ophthalmology/optometry visit for glaucoma screening and checkup Recommended yearly dental visit for hygiene and checkup  Vaccinations: Influenza vaccine up to date Pneumococcal vaccine up to date Tdap vaccine -recommended to get at your local pharmacy.  Shingles vaccine up to date  Advanced directives: on file.   Conditions/risks identified: advanced age, cardiovascular event  Next appointment: yearly for AWV   Preventive Care 62 Years and Older, Male Preventive care refers to lifestyle choices and visits with your health care provider that can promote health and wellness. What does preventive care include?  A yearly physical exam. This is also called an annual well check.  Dental exams once or twice a year.  Routine eye exams. Ask your health care provider how often you should have your eyes checked.  Personal lifestyle choices, including:  Daily care of your teeth and gums.  Regular physical activity.  Eating a healthy diet.  Avoiding tobacco and drug use.  Limiting alcohol use.  Practicing safe sex.  Taking low doses of aspirin every day.  Taking vitamin and mineral supplements as recommended by your health care provider. What happens during an annual well check? The services and screenings done by your health care provider during your annual well check will depend on your age, overall health, lifestyle risk factors, and family history of disease. Counseling  Your health care provider may ask you questions about your:  Alcohol use.  Tobacco use.  Drug use.  Emotional well-being.  Home and relationship well-being.  Sexual  activity.  Eating habits.  History of falls.  Memory and ability to understand (cognition).  Work and work Statistician. Screening  You may have the following tests or measurements:  Height, weight, and BMI.  Blood pressure.  Lipid and cholesterol levels. These may be checked every 5 years, or more frequently if you are over 71 years old.  Skin check.  Lung cancer screening. You may have this screening every year starting at age 78 if you have a 30-pack-year history of smoking and currently smoke or have quit within the past 15 years.  Fecal occult blood test (FOBT) of the stool. You may have this test every year starting at age 22.  Flexible sigmoidoscopy or colonoscopy. You may have a sigmoidoscopy every 5 years or a colonoscopy every 10 years starting at age 72.  Prostate cancer screening. Recommendations will vary depending on your family history and other risks.  Hepatitis C blood test.  Hepatitis B blood test.  Sexually transmitted disease (STD) testing.  Diabetes screening. This is done by checking your blood sugar (glucose) after you have not eaten for a while (fasting). You may have this done every 1-3 years.  Abdominal aortic aneurysm (AAA) screening. You may need this if you are a current or former smoker.  Osteoporosis. You may be screened starting at age 50 if you are at high risk. Talk with your health care provider about your test results, treatment options, and if necessary, the need for more tests. Vaccines  Your health care provider may recommend certain vaccines, such as:  Influenza vaccine. This is recommended every year.  Tetanus, diphtheria, and acellular pertussis (Tdap, Td) vaccine. You may need a Td  booster every 10 years.  Zoster vaccine. You may need this after age 55.  Pneumococcal 13-valent conjugate (PCV13) vaccine. One dose is recommended after age 47.  Pneumococcal polysaccharide (PPSV23) vaccine. One dose is recommended after age  39. Talk to your health care provider about which screenings and vaccines you need and how often you need them. This information is not intended to replace advice given to you by your health care provider. Make sure you discuss any questions you have with your health care provider. Document Released: 10/18/2015 Document Revised: 06/10/2016 Document Reviewed: 07/23/2015 Elsevier Interactive Patient Education  2017 [employer] Prevention in the Home Falls can cause injuries. They can happen to people of all ages. There are many things you can do to make your home safe and to help prevent falls. What can I do on the outside of my home?  Regularly fix the edges of walkways and driveways and fix any cracks.  Remove anything that might make you trip as you walk through a door, such as a raised step or threshold.  Trim any bushes or trees on the path to your home.  Use bright outdoor lighting.  Clear any walking paths of anything that might make someone trip, such as rocks or tools.  Regularly check to see if handrails are loose or broken. Make sure that both sides of any steps have handrails.  Any raised decks and porches should have guardrails on the edges.  Have any leaves, snow, or ice cleared regularly.  Use sand or salt on walking paths during winter.  Clean up any spills in your garage right away. This includes oil or grease spills. What can I do in the bathroom?  Use night lights.  Install grab bars by the toilet and in the tub and shower. Do not use towel bars as grab bars.  Use non-skid mats or decals in the tub or shower.  If you need to sit down in the shower, use a plastic, non-slip stool.  Keep the floor dry. Clean up any water that spills on the floor as soon as it happens.  Remove soap buildup in the tub or shower regularly.  Attach bath mats securely with double-sided non-slip rug tape.  Do not have throw rugs and other things on the floor that can make  you trip. What can I do in the bedroom?  Use night lights.  Make sure that you have a light by your bed that is easy to reach.  Do not use any sheets or blankets that are too big for your bed. They should not hang down onto the floor.  Have a firm chair that has side arms. You can use this for support while you get dressed.  Do not have throw rugs and other things on the floor that can make you trip. What can I do in the kitchen?  Clean up any spills right away.  Avoid walking on wet floors.  Keep items that you use a lot in easy-to-reach places.  If you need to reach something above you, use a strong step stool that has a grab bar.  Keep electrical cords out of the way.  Do not use floor polish or wax that makes floors slippery. If you must use wax, use non-skid floor wax.  Do not have throw rugs and other things on the floor that can make you trip. What can I do with my stairs?  Do not leave any items on the stairs.  Make sure that there are handrails on both sides of the stairs and use them. Fix handrails that are broken or loose. Make sure that handrails are as long as the stairways.  Check any carpeting to make sure that it is firmly attached to the stairs. Fix any carpet that is loose or worn.  Avoid having throw rugs at the top or bottom of the stairs. If you do have throw rugs, attach them to the floor with carpet tape.  Make sure that you have a light switch at the top of the stairs and the bottom of the stairs. If you do not have them, ask someone to add them for you. What else can I do to help prevent falls?  Wear shoes that:  Do not have high heels.  Have rubber bottoms.  Are comfortable and fit you well.  Are closed at the toe. Do not wear sandals.  If you use a stepladder:  Make sure that it is fully opened. Do not climb a closed stepladder.  Make sure that both sides of the stepladder are locked into place.  Ask someone to hold it for you, if  possible.  Clearly mark and make sure that you can see:  Any grab bars or handrails.  First and last steps.  Where the edge of each step is.  Use tools that help you move around (mobility aids) if they are needed. These include:  Canes.  Walkers.  Scooters.  Crutches.  Turn on the lights when you go into a dark area. Replace any light bulbs as soon as they burn out.  Set up your furniture so you have a clear path. Avoid moving your furniture around.  If any of your floors are uneven, fix them.  If there are any pets around you, be aware of where they are.  Review your medicines with your doctor. Some medicines can make you feel dizzy. This can increase your chance of falling. Ask your doctor what other things that you can do to help prevent falls. This information is not intended to replace advice given to you by your health care provider. Make sure you discuss any questions you have with your health care provider. Document Released: 07/18/2009 Document Revised: 02/27/2016 Document Reviewed: 10/26/2014 Elsevier Interactive Patient Education  2017 [employer].

## 2020-09-13 NOTE — Telephone Encounter (Signed)
Pt is requesting a call back from a CMA in regards to his cardiac clearance.

## 2020-09-15 ENCOUNTER — Other Ambulatory Visit: Payer: Self-pay | Admitting: Cardiovascular Disease

## 2020-09-15 NOTE — Telephone Encounter (Signed)
No myoview needed, Sounds like no angina sx Thx TG

## 2020-09-16 NOTE — Telephone Encounter (Signed)
   Primary Cardiologist: Peter Martinique, MD  Chart reviewed as part of pre-operative protocol coverage. Per Dr. Rockey Situ, patient may proceed with procedure without additional testing. Patient can complete more than 4.0 METS without angina.   Therefore, based on ACC/AHA guidelines, the patient would be at acceptable risk for the planned procedure without further cardiovascular testing.   I will route this recommendation to the requesting party via Epic fax function and remove from pre-op pool. Please call with questions.  Ledora Bottcher, PA 09/16/2020, 2:37 PM

## 2020-09-24 ENCOUNTER — Other Ambulatory Visit: Payer: Medicare HMO

## 2020-09-26 ENCOUNTER — Ambulatory Visit: Payer: Medicare HMO | Admitting: Internal Medicine

## 2020-10-18 NOTE — Telephone Encounter (Signed)
I have left a voicemail to patient indicating that he has been given clearance from his cardiology team to go forward with colonoscopy procedure and that he should call back to schedule.  I have also sent a letter to patient to advise of this information.

## 2020-11-25 ENCOUNTER — Encounter: Payer: Self-pay | Admitting: Internal Medicine

## 2020-11-28 ENCOUNTER — Other Ambulatory Visit: Payer: Self-pay | Admitting: Internal Medicine

## 2020-12-02 ENCOUNTER — Other Ambulatory Visit: Payer: Self-pay | Admitting: Cardiovascular Disease

## 2020-12-04 ENCOUNTER — Other Ambulatory Visit: Payer: Self-pay

## 2020-12-04 MED ORDER — BUPROPION HCL ER (XL) 300 MG PO TB24: 300.0000 mg | ORAL_TABLET | Freq: Every day | ORAL | 0 refills | Status: DC

## 2020-12-19 ENCOUNTER — Other Ambulatory Visit: Payer: Self-pay | Admitting: Internal Medicine

## 2020-12-20 NOTE — Telephone Encounter (Signed)
Please review and advise if pt will require updated cardiac clearance and if okay to proceed with scheduling procedure.

## 2020-12-20 NOTE — Telephone Encounter (Signed)
Patient calling back stating he is ready to schedule procedure.  Please advise if ok to schedule procedure.

## 2020-12-21 NOTE — Telephone Encounter (Signed)
Okay to schedule colonoscopy. Thank you.

## 2020-12-26 ENCOUNTER — Encounter: Payer: Self-pay | Admitting: Gastroenterology

## 2020-12-30 ENCOUNTER — Other Ambulatory Visit: Payer: Self-pay | Admitting: Internal Medicine

## 2020-12-30 NOTE — Telephone Encounter (Signed)
High risk or very high risk warning populated when attempting to refill medication. RX request sent to PCP for review and approval if warranted.   

## 2021-01-01 NOTE — Telephone Encounter (Signed)
Appointment Information  Name: Mitchell Stephens, Mitchell Stephens. MRN: [id number]  Date: 01/22/2021 Status: Sch  Time: 9:30 AM Length: 30  Visit Type: PROPOFOL COLON [1272] Copay: $0.00  Provider: Thornton Park, MD Department: [facility]  Referring Provider: [name] CSN: [id number]  Notes: Direct/Recall colon/sch w. pt/No bt/No diab/Ins/ppwork mailed/CSZ  Made On: Change Notes: 12/26/2020 10:47 AM 12/26/2020 10:47 AM By: By: Genia Plants, CATHERIN S   Pre-visit also scheduled for 01/08/21

## 2021-01-09 ENCOUNTER — Ambulatory Visit (AMBULATORY_SURGERY_CENTER): Payer: Medicare HMO | Admitting: *Deleted

## 2021-01-09 ENCOUNTER — Other Ambulatory Visit: Payer: Self-pay

## 2021-01-09 VITALS — Ht 73.0 in | Wt 210.0 lb

## 2021-01-09 DIAGNOSIS — Z86010 Personal history of colonic polyps: Secondary | ICD-10-CM

## 2021-01-09 MED ORDER — SUTAB 1479-225-188 MG PO TABS: 24.0000 | ORAL_TABLET | ORAL | 0 refills | Status: DC

## 2021-01-09 NOTE — Progress Notes (Signed)
No egg or soy allergy known to patient  No issues with past sedation with any surgeries or procedures Patient denies ever being told they had issues or difficulty with intubation  No FH of Malignant Hyperthermia No diet pills per patient No home 02 use per patient  No blood thinners per patient  Pt denies issues with constipation  No A fib or A flutter  EMMI video to pt or via [place] 19 guidelines implemented in PV today with Pt and RN  Pt is fully vaccinated  for Covid  Last colon prep not great , will do a 2 day sutab   Sutab  Coupon given to pt in PV today , Code to Pharmacy and  NO PA's for preps discussed with pt In PV today  Discussed with pt there will be an out-of-pocket cost for prep and that varies from $0 to 70 dollars   Due to the COVID-19 pandemic we are asking patients to follow certain guidelines.  Pt aware of COVID protocols and [facility] guidelines   Pt verified name, DOB, address and insurance during PV today. Pt mailed instruction packet to included paper to complete and mail back to [facility] with addressed and stamped envelope, Emmi video, copy of consent form to read and not return, and instructions. Sutab  coupon mailed in packet. PV completed over the phone. Pt encouraged to call with questions or issues. My Chart instructions to pt as well

## 2021-01-10 ENCOUNTER — Other Ambulatory Visit: Payer: Self-pay | Admitting: *Deleted

## 2021-01-10 MED ORDER — BUPROPION HCL ER (XL) 300 MG PO TB24: 300.0000 mg | ORAL_TABLET | Freq: Every day | ORAL | 1 refills | Status: DC

## 2021-01-10 NOTE — Telephone Encounter (Signed)
Patient called requesting refill  Pended Rx and sent to [facility] for approval due to [place].

## 2021-01-13 ENCOUNTER — Telehealth: Payer: Self-pay | Admitting: Gastroenterology

## 2021-01-13 NOTE — Telephone Encounter (Signed)
Spoke with the patient. He is aware the coupon code is with the sutab rx and also in the mail and also he can go to the sutab website and print coupon himself.

## 2021-01-13 NOTE — Telephone Encounter (Signed)
Patient called states he was told he would get a coupon for the Sutab but he has not received anything.

## 2021-01-22 ENCOUNTER — Encounter: Payer: Self-pay | Admitting: Gastroenterology

## 2021-01-22 ENCOUNTER — Other Ambulatory Visit: Payer: Self-pay

## 2021-01-22 ENCOUNTER — Ambulatory Visit (AMBULATORY_SURGERY_CENTER): Payer: Medicare HMO | Admitting: Gastroenterology

## 2021-01-22 VITALS — BP 100/62 | HR 64 | Temp 98.9°F | Resp 17 | Ht 73.0 in | Wt 210.0 lb

## 2021-01-22 DIAGNOSIS — D122 Benign neoplasm of ascending colon: Secondary | ICD-10-CM

## 2021-01-22 DIAGNOSIS — Z1211 Encounter for screening for malignant neoplasm of colon: Secondary | ICD-10-CM | POA: Diagnosis not present

## 2021-01-22 DIAGNOSIS — D123 Benign neoplasm of transverse colon: Secondary | ICD-10-CM

## 2021-01-22 DIAGNOSIS — Z860100 Personal history of colon polyps, unspecified: Secondary | ICD-10-CM

## 2021-01-22 DIAGNOSIS — D124 Benign neoplasm of descending colon: Secondary | ICD-10-CM | POA: Diagnosis not present

## 2021-01-22 DIAGNOSIS — D126 Benign neoplasm of colon, unspecified: Secondary | ICD-10-CM

## 2021-01-22 DIAGNOSIS — D128 Benign neoplasm of rectum: Secondary | ICD-10-CM | POA: Diagnosis not present

## 2021-01-22 MED ORDER — SODIUM CHLORIDE 0.9 % IV SOLN: 500.0000 mL | Freq: Once | INTRAVENOUS | Status: DC

## 2021-01-22 NOTE — Patient Instructions (Signed)
HANDOUTS PROVIDED ON: POLYPS  The polyps removed today have been sent for pathology.  The results can take 1-3 weeks to receive.  When your next colonoscopy should occur will be based on the pathology results.    You may resume your previous diet and medication schedule.  Thank you for allowing [place] to care for you today!!!   YOU HAD AN ENDOSCOPIC PROCEDURE TODAY AT [place]:   Refer to the procedure report that was given to you for any specific questions about what was found during the examination.  If the procedure report does not answer your questions, please call your gastroenterologist to clarify.  If you requested that your care partner not be given the details of your procedure findings, then the procedure report has been included in a sealed envelope for you to review at your convenience later.  YOU SHOULD EXPECT: Some feelings of bloating in the abdomen. Passage of more gas than usual.  Walking can help get rid of the air that was put into your GI tract during the procedure and reduce the bloating. If you had a lower endoscopy (such as a colonoscopy or flexible sigmoidoscopy) you may notice spotting of blood in your stool or on the toilet paper. If you underwent a bowel prep for your procedure, you may not have a normal bowel movement for a few days.  Please Note:  You might notice some irritation and congestion in your nose or some drainage.  This is from the oxygen used during your procedure.  There is no need for concern and it should clear up in a day or so.  SYMPTOMS TO REPORT IMMEDIATELY:   Following lower endoscopy (colonoscopy or flexible sigmoidoscopy):  Excessive amounts of blood in the stool  Significant tenderness or worsening of abdominal pains  Swelling of the abdomen that is new, acute  Fever of 100F or higher  For urgent or emergent issues, a gastroenterologist can be reached at any hour by calling [phone]. Do not use MyChart messaging for  urgent concerns.    DIET:  We do recommend a small meal at first, but then you may proceed to your regular diet.  Drink plenty of fluids but you should avoid alcoholic beverages for 24 hours.  ACTIVITY:  You should plan to take it easy for the rest of today and you should NOT DRIVE or use heavy machinery until tomorrow (because of the sedation medicines used during the test).    FOLLOW UP: Our staff will call the number listed on your records Friday morning between 7:15 am and 8:15 am following your procedure to check on you and address any questions or concerns that you may have regarding the information given to you following your procedure. If we do not reach you, we will leave a message.  We will attempt to reach you two times.  During this call, we will ask if you have developed any symptoms of COVID 19. If you develop any symptoms (ie: fever, flu-like symptoms, shortness of breath, cough etc.) before then, please call [phone].  If you test positive for Covid 19 in the 2 weeks post procedure, please call and report this information to [place].    If any biopsies were taken you will be contacted by phone or by letter within the next 1-3 weeks.  Please call us at [phone] if you have not heard about the biopsies in 3 weeks.    SIGNATURES/CONFIDENTIALITY: You and/or your care partner have signed  paperwork which will be entered into your electronic medical record.  These signatures attest to the fact that that the information above on your After Visit Summary has been reviewed and is understood.  Full responsibility of the confidentiality of this discharge information lies with you and/or your care-partner.

## 2021-01-22 NOTE — Op Note (Signed)
[place] Patient Name: Mitchell Stephens Procedure Date: 01/22/2021 9:32 AM MRN: [id number] Endoscopist: Thornton Park MD, MD Age: 75 Referring MD:  Date of Birth: Jun 01, 1946 Gender: Male Account #: [id number] Procedure:                Colonoscopy Indications:              Surveillance: Personal history of adenomatous                            polyps on last colonoscopy > 5 years ago                           - TVA removed in 2012                           - No polyps per patient report in 2016                           Maternal aunt and maternal grandmother with colon                            polyps Medicines:                Monitored Anesthesia Care Procedure:                Pre-Anesthesia Assessment:                           - Prior to the procedure, a History and Physical                            was performed, and patient medications and                            allergies were reviewed. The patient's tolerance of                            previous anesthesia was also reviewed. The risks                            and benefits of the procedure and the sedation                            options and risks were discussed with the patient.                            All questions were answered, and informed consent                            was obtained. Prior Anticoagulants: The patient has                            taken no previous anticoagulant or antiplatelet  agents. ASA Grade Assessment: III - A patient with                            severe systemic disease. After reviewing the risks                            and benefits, the patient was deemed in                            satisfactory condition to undergo the procedure.                           After obtaining informed consent, the colonoscope                            was passed under direct vision. Throughout the                            procedure, the  patient's blood pressure, pulse, and                            oxygen saturations were monitored continuously. The                            Olympus CF-HQ190L ([id number]) Colonoscope was                            introduced through the anus and advanced to the 3                            cm into the ileum. A second forward view of the                            right colon was performed. The colonoscopy was                            performed without difficulty. The patient tolerated                            the procedure well. The quality of the bowel                            preparation was good. The terminal ileum, ileocecal                            valve, appendiceal orifice, and rectum were                            photographed. Scope In: 9:38:48 AM Scope Out: 10:07:44 AM Scope Withdrawal Time: 0 hours 26 minutes 21 seconds  Total Procedure Duration: 0 hours 28 minutes 56 seconds  Findings:                 The perianal and digital rectal examinations were  normal.                           A 9 mm polyp was found in the rectum. The polyp was                            sessile. The polyp was removed with a cold snare.                            Resection and retrieval were complete. Estimated                            blood loss was minimal. Oozing did occur after the                            polypectomy but had resolved at the conclusion of                            the procedure.                           Two sessile polyps were found in the descending                            colon. The polyps were 2 to 3 mm in size. These                            polyps were removed with a cold snare. Resection                            and retrieval were complete. Estimated blood loss                            was minimal.                           Five sessile polyps were found in the transverse                            colon. The polyps were 1  to 4 mm in size. These                            polyps were removed with a cold snare. Resection                            and retrieval were complete. Estimated blood loss                            was minimal.                           A 4 mm polyp was found in the proximal transverse  colon. The polyp was sessile. The polyp was removed                            with a cold snare. Resection and retrieval were                            complete. Estimated blood loss was minimal.                           A 2 mm polyp was found in the hepatic flexure. The                            polyp was sessile. The polyp was removed with a                            cold snare. Resection and retrieval were complete.                            Estimated blood loss was minimal.                           A 2 mm polyp was found in the ascending colon. The                            polyp was sessile. The polyp was removed with a                            cold snare. Resection and retrieval were complete.                            Estimated blood loss was minimal.                           The exam was otherwise without abnormality on                            direct and retroflexion views except for internal                            hemorrhoids. Complications:            No immediate complications. Estimated blood loss:                            Minimal. Estimated Blood Loss:     Estimated blood loss was minimal. Impression:               - One 9 mm polyp in the rectum, removed with a cold                            snare. Resected and retrieved.                           - Two 2 to 3 mm polyps in  the descending colon,                            removed with a cold snare. Resected and retrieved.                           - Five 1 to 4 mm polyps in the transverse colon,                            removed with a cold snare. Resected and retrieved.                            - One 4 mm polyp in the proximal transverse colon,                            removed with a cold snare. Resected and retrieved.                           - One 2 mm polyp at the hepatic flexure, removed                            with a cold snare. Resected and retrieved.                           - One 2 mm polyp in the ascending colon, removed                            with a cold snare. Resected and retrieved.                           - The examination was otherwise normal on direct                            and retroflexion views. Recommendation:           - Patient has a contact number available for                            emergencies. The signs and symptoms of potential                            delayed complications were discussed with the                            patient. Return to normal activities tomorrow.                            Written discharge instructions were provided to the                            patient.                           - Resume previous diet.                           -  Continue present medications.                           - Await pathology results.                           - Repeat colonoscopy date to be determined after                            pending pathology results are reviewed for                            surveillance.                           - Emerging evidence supports eating a diet of                            fruits, vegetables, grains, calcium, and yogurt                            while reducing red meat and alcohol may reduce the                            risk of colon cancer.                           - Thank you for allowing me to be involved in your                            colon cancer prevention. Thornton Park MD, MD 01/22/2021 10:19:45 AM This report has been signed electronically.

## 2021-01-22 NOTE — Progress Notes (Signed)
A and O x3. Report to RN. Tolerated MAC anesthesia well.

## 2021-01-22 NOTE — Progress Notes (Signed)
Called to room to assist during endoscopic procedure.  Patient ID and intended procedure confirmed with present staff. Received instructions for my participation in the procedure from the performing physician.  

## 2021-01-22 NOTE — Progress Notes (Signed)
Pt. Reports no change in his medical or surgical history since his pre-visit 01/09/2021.

## 2021-01-24 ENCOUNTER — Telehealth: Payer: Self-pay

## 2021-01-24 NOTE — Telephone Encounter (Signed)
LVM

## 2021-01-30 ENCOUNTER — Encounter: Payer: Self-pay | Admitting: Gastroenterology

## 2021-02-02 ENCOUNTER — Other Ambulatory Visit: Payer: Self-pay | Admitting: Cardiovascular Disease

## 2021-02-05 DIAGNOSIS — G4733 Obstructive sleep apnea (adult) (pediatric): Secondary | ICD-10-CM | POA: Diagnosis not present

## 2021-02-10 ENCOUNTER — Other Ambulatory Visit: Payer: Self-pay

## 2021-02-10 ENCOUNTER — Encounter: Payer: Self-pay | Admitting: Dermatology

## 2021-02-10 ENCOUNTER — Ambulatory Visit: Payer: Medicare HMO | Admitting: Dermatology

## 2021-02-10 DIAGNOSIS — L570 Actinic keratosis: Secondary | ICD-10-CM

## 2021-02-10 DIAGNOSIS — C441122 Basal cell carcinoma of skin of right lower eyelid, including canthus: Secondary | ICD-10-CM | POA: Diagnosis not present

## 2021-02-10 DIAGNOSIS — L820 Inflamed seborrheic keratosis: Secondary | ICD-10-CM | POA: Diagnosis not present

## 2021-02-10 DIAGNOSIS — D485 Neoplasm of uncertain behavior of skin: Secondary | ICD-10-CM

## 2021-02-10 DIAGNOSIS — Z85828 Personal history of other malignant neoplasm of skin: Secondary | ICD-10-CM

## 2021-02-10 DIAGNOSIS — L578 Other skin changes due to chronic exposure to nonionizing radiation: Secondary | ICD-10-CM

## 2021-02-10 HISTORY — DX: Actinic keratosis: L57.0

## 2021-02-10 NOTE — Patient Instructions (Addendum)
If you have any questions or concerns for your doctor, please call our main line at [phone] and press option 4 to reach your doctor's medical assistant. If no one answers, please leave a voicemail as directed and we will return your call as soon as possible. Messages left after 4 pm will be answered the following business day.   You may also send us a message via MyChart. We typically respond to MyChart messages within 1-2 business days.  For prescription refills, please ask your pharmacy to contact our office. Our fax number is [phone].  If you have an urgent issue when the clinic is closed that cannot wait until the next business day, you can page your doctor at the number below.    Please note that while we do our best to be available for urgent issues outside of office hours, we are not available 24/7.   If you have an urgent issue and are unable to reach us, you may choose to seek medical care at your doctor's office, retail clinic, urgent care center, or emergency room.  If you have a medical emergency, please immediately call 911 or go to the emergency department.  Pager Numbers  - Dr. Kowalski: [phone]  - Dr. Moye: [phone]  - Dr. Stewart: [phone]  In the event of inclement weather, please call our main line at [phone] for an update on the status of any delays or closures.  Dermatology Medication Tips: Please keep the boxes that topical medications come in in order to help keep track of the instructions about where and how to use these. Pharmacies typically print the medication instructions only on the boxes and not directly on the medication tubes.   If your medication is too expensive, please contact our office at [phone] option 4 or send us a message through MyChart.   We are unable to tell what your co-pay for medications will be in advance as this is different depending on your insurance coverage. However, we may be able to find a  substitute medication at lower cost or fill out paperwork to get insurance to cover a needed medication.   If a prior authorization is required to get your medication covered by your insurance company, please allow us 1-2 business days to complete this process.  Drug prices often vary depending on where the prescription is filled and some pharmacies may offer cheaper prices.  The website www.goodrx.com contains coupons for medications through different pharmacies. The prices here do not account for what the cost may be with help from insurance (it may be cheaper with your insurance), but the website can give you the price if you did not use any insurance.  - You can print the associated coupon and take it with your prescription to the pharmacy.  - You may also stop by our office during regular business hours and pick up a GoodRx coupon card.  - If you need your prescription sent electronically to a different pharmacy, notify our office through [facility] MyChart or by phone at [phone] option 4.     Wound Care Instructions  1. Cleanse wound gently with soap and water once a day then pat dry with clean gauze. Apply a thing coat of Petrolatum (petroleum jelly, "Vaseline") over the wound (unless you have an allergy to this). We recommend that you use a new, sterile tube of Vaseline. Do not pick or remove scabs. Do not remove the yellow or white "healing tissue" from the base of the wound.    2. Cover the wound with fresh, clean, nonstick gauze and secure with paper tape. You may use Band-Aids in place of gauze and tape if the would is small enough, but would recommend trimming much of the tape off as there is often too much. Sometimes Band-Aids can irritate the skin.  3. You should call the office for your biopsy report after 1 week if you have not already been contacted.  4. If you experience any problems, such as abnormal amounts of bleeding, swelling, significant bruising, significant pain,  or evidence of infection, please call the office immediately.  5. FOR ADULT SURGERY PATIENTS: If you need something for pain relief you may take 1 extra strength Tylenol (acetaminophen) AND 2 Ibuprofen (200mg each) together every 4 hours as needed for pain. (do not take these if you are allergic to them or if you have a reason you should not take them.) Typically, you may only need pain medication for 1 to 3 days.     

## 2021-02-10 NOTE — Progress Notes (Signed)
Follow-Up Visit   Subjective  Mitchell Stephens. is a 75 y.o. male who presents for the following: Actinic Keratosis (Of the face - recheck for new or persistent lesions) and irregular skin lesion (Of the L ear x 1 week - scabbed, bleeds ). He also had other growths he would like checked.  He has history of multiple skin cancers.  The following portions of the chart were reviewed this encounter and updated as appropriate:   Tobacco  Allergies  Meds  Problems  Med Hx  Surg Hx  Fam Hx     Review of Systems:  No other skin or systemic complaints except as noted in HPI or Assessment and Plan.  Objective  Well appearing patient in no apparent distress; mood and affect are within normal limits.  A focused examination was performed including the face and ears. Relevant physical exam findings are noted in the Assessment and Plan.  Objective  Left Ear mid antihelix: 1.0 cm crusted papule     Objective  Right lower eyelid/lat canthus: 0.7 cm indurated papule     Objective  Face (15): Erythematous thin papules/macules with gritty scale.   Objective  Hands (20): Erythematous keratotic or waxy stuck-on papule or plaque.   Assessment & Plan  Neoplasm of uncertain behavior of skin (2) Left Ear mid antihelix  Skin / nail biopsy Type of biopsy: tangential   Informed consent: discussed and consent obtained   Timeout: patient name, date of birth, surgical site, and procedure verified   Procedure prep:  Patient was prepped and draped in usual sterile fashion Prep type:  Isopropyl alcohol Anesthesia: the lesion was anesthetized in a standard fashion   Anesthetic:  1% lidocaine w/ epinephrine 1-100,000 buffered w/ 8.4% NaHCO3 Instrument used: flexible razor blade   Hemostasis achieved with: pressure, aluminum chloride and electrodesiccation   Outcome: patient tolerated procedure well   Post-procedure details: sterile dressing applied and wound care instructions given    Dressing type: bandage and petrolatum    Specimen 1 - Surgical pathology Differential Diagnosis: BCC vs other  Check Margins: No 1.0 cm crusted papule  Right lower eyelid/lat canthus  Skin / nail biopsy Type of biopsy: tangential   Informed consent: discussed and consent obtained   Timeout: patient name, date of birth, surgical site, and procedure verified   Procedure prep:  Patient was prepped and draped in usual sterile fashion Prep type:  Isopropyl alcohol Anesthesia: the lesion was anesthetized in a standard fashion   Anesthetic:  1% lidocaine w/ epinephrine 1-100,000 buffered w/ 8.4% NaHCO3 Instrument used: flexible razor blade   Hemostasis achieved with: pressure, aluminum chloride and electrodesiccation   Outcome: patient tolerated procedure well   Post-procedure details: sterile dressing applied and wound care instructions given   Dressing type: bandage and petrolatum    Specimen 2 - Surgical pathology Differential Diagnosis: BCC vs other  Check Margins: No 0.7 cm indurated papule  AK (actinic keratosis) (15) Face  Actinic Damage - Severe, confluent actinic changes with pre-cancerous actinic keratoses  - Severe, chronic, not at goal, secondary to cumulative UV radiation exposure over time - diffuse scaly erythematous macules and papules with underlying dyspigmentation - Discussed Prescription "Field Treatment" for Severe, Chronic Confluent Actinic Changes with Pre-Cancerous Actinic Keratoses Field treatment involves treatment of an entire area of skin that has confluent Actinic Changes (Sun/ Ultraviolet light damage) and PreCancerous Actinic Keratoses by method of PhotoDynamic Therapy (PDT) and/or prescription Topical Chemotherapy agents such as 5-fluorouracil, 5-fluorouracil/calcipotriene, and/or imiquimod.  The purpose is to decrease the number of clinically evident and subclinical PreCancerous lesions to prevent progression to development of skin cancer by chemically  destroying early precancer changes that may or may not be visible.  It has been shown to reduce the risk of developing skin cancer in the treated area. As a result of treatment, redness, scaling, crusting, and open sores may occur during treatment course. One or more than one of these methods may be used and may have to be used several times to control, suppress and eliminate the PreCancerous changes. Discussed treatment course, expected reaction, and possible side effects. - Recommend daily broad spectrum sunscreen SPF 30+ to sun-exposed areas, reapply every 2 hours as needed.  - Staying in the shade or wearing long sleeves, sun glasses (UVA+UVB protection) and wide brim hats (4-inch brim around the entire circumference of the hat) are also recommended. - Call for new or changing lesions.  Will plan PDT to face on follow up.  Destruction of lesion - Face Complexity: simple   Destruction method: cryotherapy   Informed consent: discussed and consent obtained   Timeout:  patient name, date of birth, surgical site, and procedure verified Lesion destroyed using liquid nitrogen: Yes   Region frozen until ice ball extended beyond lesion: Yes   Outcome: patient tolerated procedure well with no complications   Post-procedure details: wound care instructions given    Inflamed seborrheic keratosis (20) Hands  Destruction of lesion - Hands Complexity: simple   Destruction method: cryotherapy   Informed consent: discussed and consent obtained   Timeout:  patient name, date of birth, surgical site, and procedure verified Lesion destroyed using liquid nitrogen: Yes   Region frozen until ice ball extended beyond lesion: Yes   Outcome: patient tolerated procedure well with no complications   Post-procedure details: wound care instructions given    Actinic Damage - chronic, secondary to cumulative UV radiation exposure/sun exposure over time - diffuse scaly erythematous macules with underlying  dyspigmentation - Recommend daily broad spectrum sunscreen SPF 30+ to sun-exposed areas, reapply every 2 hours as needed.  - Recommend staying in the shade or wearing long sleeves, sun glasses (UVA+UVB protection) and wide brim hats (4-inch brim around the entire circumference of the hat). - Call for new or changing lesions.  Return in about 1 month (around 03/13/2021) for PDT to face then 4-5 month follow up with Dr. Nehemiah Massed.  Luther Redo, CMA, am acting as scribe for Sarina Ser, MD .  Documentation: I have reviewed the above documentation for accuracy and completeness, and I agree with the above.  Sarina Ser, MD

## 2021-02-11 ENCOUNTER — Encounter: Payer: Self-pay | Admitting: Dermatology

## 2021-02-19 ENCOUNTER — Telehealth: Payer: Self-pay

## 2021-02-19 ENCOUNTER — Other Ambulatory Visit: Payer: Self-pay

## 2021-02-19 MED ORDER — NITROGLYCERIN 0.4 MG SL SUBL: 0.4000 mg | SUBLINGUAL_TABLET | SUBLINGUAL | 1 refills | Status: DC | PRN

## 2021-02-19 NOTE — Telephone Encounter (Signed)
-----   Message from Ralene Bathe, MD sent at 02/17/2021 10:58 AM EDT ----- Diagnosis 1. Skin , left ear mid antihelix ACTINIC KERATOSIS, CRUSTED 2. Skin , right lower eyelid/lat canthus BASAL CELL CARCINOMA, SUPERFICIAL AND NODULAR PATTERNS, BASE INVOLVED  1- PreCancer Schedule for treatment (LN2) 2- Cancer - BCC Schedule for treatment (consider EDC)

## 2021-02-19 NOTE — Telephone Encounter (Signed)
Left message for patient to call office for results/hd 

## 2021-02-20 ENCOUNTER — Telehealth: Payer: Self-pay

## 2021-02-20 NOTE — Telephone Encounter (Signed)
Patient advised of BX results. Scheduled for LN2 at time of PDT appt. He would like to discuss treatment options with Dr. Nehemiah Massed regarding second biopsy site.

## 2021-02-20 NOTE — Telephone Encounter (Signed)
-----   Message from David C Kowalski, MD sent at 02/17/2021 10:58 AM EDT ----- Diagnosis 1. Skin , left ear mid antihelix ACTINIC KERATOSIS, CRUSTED 2. Skin , right lower eyelid/lat canthus BASAL CELL CARCINOMA, SUPERFICIAL AND NODULAR PATTERNS, BASE INVOLVED  1- PreCancer Schedule for treatment (LN2) 2- Cancer - BCC Schedule for treatment (consider EDC) 

## 2021-03-04 ENCOUNTER — Other Ambulatory Visit: Payer: Self-pay | Admitting: *Deleted

## 2021-03-04 MED ORDER — SIMVASTATIN 40 MG PO TABS: 40.0000 mg | ORAL_TABLET | Freq: Every day | ORAL | 1 refills | Status: DC

## 2021-03-04 NOTE — Telephone Encounter (Signed)
Pharmacy requested refill

## 2021-03-08 DIAGNOSIS — G4733 Obstructive sleep apnea (adult) (pediatric): Secondary | ICD-10-CM | POA: Diagnosis not present

## 2021-03-13 ENCOUNTER — Ambulatory Visit: Payer: Medicare HMO | Admitting: Internal Medicine

## 2021-03-14 ENCOUNTER — Ambulatory Visit (INDEPENDENT_AMBULATORY_CARE_PROVIDER_SITE_OTHER): Payer: Medicare HMO | Admitting: Nurse Practitioner

## 2021-03-14 ENCOUNTER — Other Ambulatory Visit: Payer: Self-pay

## 2021-03-14 ENCOUNTER — Encounter: Payer: Self-pay | Admitting: Nurse Practitioner

## 2021-03-14 VITALS — BP 128/68 | HR 66 | Temp 97.3°F | Ht 73.0 in | Wt 212.2 lb

## 2021-03-14 DIAGNOSIS — I471 Supraventricular tachycardia: Secondary | ICD-10-CM | POA: Diagnosis not present

## 2021-03-14 DIAGNOSIS — E782 Mixed hyperlipidemia: Secondary | ICD-10-CM | POA: Diagnosis not present

## 2021-03-14 DIAGNOSIS — R739 Hyperglycemia, unspecified: Secondary | ICD-10-CM

## 2021-03-14 DIAGNOSIS — Z87891 Personal history of nicotine dependence: Secondary | ICD-10-CM | POA: Diagnosis not present

## 2021-03-14 DIAGNOSIS — M1A09X0 Idiopathic chronic gout, multiple sites, without tophus (tophi): Secondary | ICD-10-CM | POA: Diagnosis not present

## 2021-03-14 DIAGNOSIS — I10 Essential (primary) hypertension: Secondary | ICD-10-CM | POA: Diagnosis not present

## 2021-03-14 DIAGNOSIS — I429 Cardiomyopathy, unspecified: Secondary | ICD-10-CM

## 2021-03-14 DIAGNOSIS — I25110 Atherosclerotic heart disease of native coronary artery with unstable angina pectoris: Secondary | ICD-10-CM | POA: Diagnosis not present

## 2021-03-14 MED ORDER — SIMVASTATIN 40 MG PO TABS: 40.0000 mg | ORAL_TABLET | Freq: Every day | ORAL | 1 refills | Status: DC

## 2021-03-14 MED ORDER — LOSARTAN POTASSIUM 25 MG PO TABS: 25.0000 mg | ORAL_TABLET | Freq: Every day | ORAL | 1 refills | Status: DC

## 2021-03-14 MED ORDER — COLCHICINE 0.6 MG PO TABS: ORAL_TABLET | ORAL | 1 refills | Status: DC

## 2021-03-14 NOTE — Progress Notes (Signed)
Careteam: Patient Care Team: Mitchell Chandler, NP as PCP - General (Geriatric Medicine) Martinique, Peter M, MD as PCP - Cardiology (Cardiology) [name], [place] as Referring Physician (Dermatology) [name], MD as Referring Physician (Otolaryngology)  PLACE OF SERVICE:  [place]  Advanced Directive information    Allergies  Allergen Reactions   Pollen Extract Other (See Comments)    Stuffy nose/itchy throat (Seasonal allergies)    Chief Complaint  Patient presents with   Medical Management of Chronic Issues    Patient presents today for 6 month follow-up for HTN and HLD.     HPI: Patient is a 75 y.o. male routine follow up.   He needs a prescription for colchicine for his gout- he had a flare and did not have a Rx. He will have this when he stresses a joint.  Can feel the attack coming on.   Wants to know what he will do if he gets covid. Education provided.   CAD- he has had some angina today. Worried about not getting here on time. Gone now. He gets it when he mows the yard. Pressure/ache in the center of his chest. Followed by cardiologist. He mentioned to cardiologist last year and then read something about the stress test and he cancelled it. He has used his ntg but does not use often. He reports angina "not bad" not as bad as when he mowed the yard.  Walks every day and he has it, it subsides.  [name] is his cardiologist.   He walks a 17-18 min/mile- walking daily for about 30 mins. Reports he has mild chest pressure/angina for about 10 mins of the walk- will slow down and it gets better. Reports he feels like his muscle are fatigue. The last time he took a ntg was during a walk.  He gets pain every time he mows.    Review of Systems:  Review of Systems  Constitutional:  Negative for chills, fever and weight loss.  HENT:  Negative for tinnitus.   Respiratory:  Negative for cough, sputum production and shortness of breath.   Cardiovascular:   Positive for chest pain. Negative for palpitations and leg swelling.  Gastrointestinal:  Negative for abdominal pain, constipation, diarrhea and heartburn.  Genitourinary:  Negative for dysuria, frequency and urgency.  Musculoskeletal:  Negative for back pain, falls, joint pain and myalgias.  Skin: Negative.   Neurological:  Negative for dizziness and headaches.  Psychiatric/Behavioral:  Negative for depression and memory loss. The patient does not have insomnia.    Past Medical History:  Diagnosis Date   Actinic keratosis 02/10/2021   left ear mid antihelix   Alcoholism ([place])    per pt   Allergy    Basal cell carcinoma 05/13/2020   left prox medial pretibial    Basal cell carcinoma 08/26/2020   below the left medial knee - tx with ED&C    Basal cell carcinoma 02/10/2021   Right lower eyelid/lat canthus - needs treatment   Coronary artery disease    a. 06/2017 MV: EF 45%, large inf and apical defect w/o ischemia;  b. 06/2017 Cath: LM nl, LAD 30ost, LCX 50p, OM1/2 ok, RCA 100p w/ L->R collats. EF 35%.   Depression with anxiety    Gastric ulcer, acute with hemorrhage 2001   Dr. Sharlet Salina, [facility]   H/O congenital atrial septal defect (ASD) repair    Heart murmur    ASD repair    Helicobacter pylori gastritis  Heme positive stool    History of gout    History of peptic ulcer disease    had bleeding ulcers requiring transfusion   History of PSVT (paroxysmal supraventricular tachycardia)    Hyperlipidemia    Hypertension    Ischemic cardiomyopathy    a. 06/2017 LV gram: EF 35%.   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 Annamaria Boots ([facility]); was PFO repair--has been normal in f/u   [facility] / [place] [facility])   [place]     Social History:   reports that he quit smoking about 5 years ago. His smoking use included cigarettes. He has never used smokeless tobacco. He reports that he does not drink alcohol and does not use drugs.  Family History  Problem Relation Age of Onset   Failure to thrive Mother    Arthritis Mother    Other Mother        Colectomy   Brain cancer Father    Hypertension Sister    Stomach cancer Maternal Grandmother    Cancer Paternal Grandfather        type unknown   Liver cancer Paternal Uncle    Other Other        mother's siblings died of cancer ( abdominal)   Colon cancer Neg Hx    Colon polyps Neg Hx    Esophageal cancer Neg Hx    Rectal cancer Neg Hx     Medications: Patient's Medications  New Prescriptions   No medications on file  Previous Medications   ASPIRIN EC 81 MG TABLET    Take 81 mg by mouth daily.   BUPROPION (WELLBUTRIN XL) 300 MG 24 HR TABLET    Take 1 tablet (300 mg total) by mouth daily.   EZETIMIBE (ZETIA) 10 MG TABLET    TAKE ONE TABLET BY MOUTH DAILY   HYDROCHLOROTHIAZIDE (HYDRODIURIL) 25 MG TABLET    TAKE 1 TABLET (25 MG TOTAL) BY MOUTH DAILY AS NEEDED.   ISOSORBIDE MONONITRATE (IMDUR) 30 MG 24 HR TABLET    TAKE 0.5 TABLETS (15 MG TOTAL) BY MOUTH DAILY.   LOSARTAN (COZAAR) 25 MG TABLET    TAKE 1 TABLET BY MOUTH EVERY DAY   METOPROLOL SUCCINATE (TOPROL-XL) 100 MG 24 HR TABLET    TAKE 1 TABLET BY MOUTH EVERY DAY   NITROGLYCERIN (NITROSTAT) 0.4 MG SL TABLET    Place 1 tablet (0.4 mg total) under the tongue every 5 (five) minutes as needed  for chest pain.   PROPRANOLOL (INDERAL) 20 MG TABLET    TAKE 1 TABLET (20 MG TOTAL) BY MOUTH AS NEEDED (AS NEEDED FOR FAST HEART RATES).   SENNA (SENOKOT) 8.6  MG TABLET    Take 2 tablets by mouth at bedtime.   SIMVASTATIN (ZOCOR) 40 MG TABLET    Take 1 tablet (40 mg total) by mouth daily.   SPIRONOLACTONE (ALDACTONE) 25 MG TABLET    TAKE 1 TABLET BY MOUTH EVERY DAY   TRAZODONE (DESYREL) 50 MG TABLET    TAKE 1 TABLET (50 MG TOTAL) BY MOUTH AT BEDTIME AS NEEDED. FOR INSOMNIA  Modified Medications   No medications on file  Discontinued Medications   No medications on file    Physical Exam:  Vitals:   03/14/21 1304  BP: 128/68  Pulse: 66  Temp: (!) 97.3 F (36.3 C)  TempSrc: Temporal  SpO2: 98%  Weight: 212 lb 3.2 oz (96.3 kg)  Height: 6\' 1"  (1.854 m)   Body mass index is 28 kg/m. Wt Readings from Last 3 Encounters:  03/14/21 212 lb 3.2 oz (96.3 kg)  01/22/21 210 lb (95.3 kg)  01/09/21 210 lb (95.3 kg)    Physical Exam Constitutional:      General: He is not in acute distress.    Appearance: He is well-developed. He is not diaphoretic.  HENT:     Head: Normocephalic and atraumatic.     Mouth/Throat:     Pharynx: No oropharyngeal exudate.  Eyes:     Conjunctiva/sclera: Conjunctivae normal.     Pupils: Pupils are equal, round, and reactive to light.  Cardiovascular:     Rate and Rhythm: Normal rate and regular rhythm.     Heart sounds: Normal heart sounds.  Pulmonary:     Effort: Pulmonary effort is normal.     Breath sounds: Normal breath sounds.  Abdominal:     General: Bowel sounds are normal.     Palpations: Abdomen is soft.  Musculoskeletal:        General: No tenderness.     Cervical back: Normal range of motion and neck supple.  Skin:    General: Skin is warm and dry.  Neurological:     Mental Status: He is alert and oriented to person, place, and time.    Labs reviewed: Basic Metabolic Panel: Recent Labs    04/01/20 0843 09/10/20 0922  NA 136 135   K 3.9 4.0  CL 102 102  CO2 26 21  GLUCOSE 85 96  BUN 22 30*  CREATININE 1.33* 1.44*  CALCIUM 9.3 9.1   Liver Function Tests: Recent Labs    04/01/20 0843  AST 14  ALT 13  BILITOT 0.6  PROT 6.0*   No results for input(s): LIPASE, AMYLASE in the last 8760 hours. No results for input(s): AMMONIA in the last 8760 hours. CBC: Recent Labs    04/01/20 0843  WBC 7.1  NEUTROABS 4,452  HGB 13.1*  HCT 38.6  MCV 93.5  PLT 232   Lipid Panel: Recent Labs    04/01/20 0843  CHOL 114  HDL 47  LDLCALC 49  TRIG 97  CHOLHDL 2.4   TSH: No results for input(s): TSH in the last 8760 hours. A1C: Lab Results  Component Value Date   HGBA1C 5.7 (H) 04/01/2020     Assessment/Plan 1. Cardiomyopathy, unspecified type ([place]) -ongoing, without symptoms of shortness of breath. He continues to be followed by cardiology. No LE edema noted.   2. Coronary artery disease involving native coronary artery of native heart with unstable angina pectoris [facility]) -reports ongoing chest pain since he saw cardiologist back in September 2021, cancelled nuclear stress test. Reassurance given today and encouraged  to reschedule. EKG stable at this time, sent to cardiologist for additional review and encouraged follow up as well. Strict precautions given. Continues to have ntg and takes PRN, continues on imdur, metoprolol and losartan - Troponin I - EKG 12-Lead- Sinus rate 59, first degree AV block   3. Mixed hyperlipidemia -to continue medication with dietary modifications.  - simvastatin (ZOCOR) 40 MG tablet; Take 1 tablet (40 mg total) by mouth daily.  Dispense: 90 tablet; Refill: 1 - COMPLETE METABOLIC PANEL WITH GFR - Lipid panel  4. Essential hypertension, benign -well controlled on current regimen - losartan (COZAAR) 25 MG tablet; Take 1 tablet (25 mg total) by mouth daily.  Dispense: 90 tablet; Refill: 1 - CBC with Differential/Platelet  5. Paroxysmal SVT (supraventricular tachycardia)  (HCC) Stable, without noted tachycardia, has not needed propranolol  6. Chronic gout of multiple sites, unspecified cause -occasional flare, needing refill on colchine to take PRN. - colchicine 0.6 MG tablet; 2 tablets may repeat 1 tablet in 1 hour if needed for gout flare  Dispense: 30 tablet; Refill: 1  7. Hyperglycemia -dietary modifications encouraged, he exercises regularly - Hemoglobin A1c  8. History of smoking 10-25 pack years - CT CHEST LUNG CA SCREEN LOW DOSE W/O CM; Future   Next appt: 3 months, sooner if needed Mitchell Stephens, [facility] [phone]

## 2021-03-15 LAB — CBC WITH DIFFERENTIAL/PLATELET
Absolute Monocytes: 651 cells/uL (ref 200–950)
Basophils Absolute: 67 cells/uL (ref 0–200)
Basophils Relative: 0.9 %
Eosinophils Absolute: 252 cells/uL (ref 15–500)
Eosinophils Relative: 3.4 %
HCT: 40.7 % (ref 38.5–50.0)
Hemoglobin: 13.6 g/dL (ref 13.2–17.1)
Lymphs Abs: 1561 cells/uL (ref 850–3900)
MCH: 31.3 pg (ref 27.0–33.0)
MCHC: 33.4 g/dL (ref 32.0–36.0)
MCV: 93.8 fL (ref 80.0–100.0)
MPV: 9.8 fL (ref 7.5–12.5)
Monocytes Relative: 8.8 %
Neutro Abs: 4869 cells/uL (ref 1500–7800)
Neutrophils Relative %: 65.8 %
Platelets: 300 10*3/uL (ref 140–400)
RBC: 4.34 10*6/uL (ref 4.20–5.80)
RDW: 12.6 % (ref 11.0–15.0)
Total Lymphocyte: 21.1 %
WBC: 7.4 10*3/uL (ref 3.8–10.8)

## 2021-03-15 LAB — LIPID PANEL
Cholesterol: 124 mg/dL (ref <200)
HDL: 45 mg/dL (ref >=40)
LDL Cholesterol (Calc): 56 mg/dL (calc)
Non-HDL Cholesterol (Calc): 79 mg/dL (calc) (ref <130)
Total CHOL/HDL Ratio: 2.8 (calc) (ref <5.0)
Triglycerides: 150 mg/dL — ABNORMAL HIGH (ref <150)

## 2021-03-15 LAB — COMPLETE METABOLIC PANEL WITH GFR
AG Ratio: 1.8 (calc) (ref 1.0–2.5)
ALT: 21 U/L (ref 9–46)
AST: 18 U/L (ref 10–35)
Albumin: 4 g/dL (ref 3.6–5.1)
Alkaline phosphatase (APISO): 40 U/L (ref 35–144)
BUN/Creatinine Ratio: 17 (calc) (ref 6–22)
BUN: 25 mg/dL (ref 7–25)
CO2: 26 mmol/L (ref 20–32)
Calcium: 9.6 mg/dL (ref 8.6–10.3)
Chloride: 104 mmol/L (ref 98–110)
Creat: 1.51 mg/dL — ABNORMAL HIGH (ref 0.70–1.18)
GFR, Est African American: 52 mL/min/{1.73_m2} — ABNORMAL LOW (ref >=60)
GFR, Est Non African American: 45 mL/min/{1.73_m2} — ABNORMAL LOW (ref >=60)
Globulin: 2.2 g/dL (calc) (ref 1.9–3.7)
Glucose, Bld: 97 mg/dL (ref 65–139)
Potassium: 4.8 mmol/L (ref 3.5–5.3)
Sodium: 137 mmol/L (ref 135–146)
Total Bilirubin: 0.5 mg/dL (ref 0.2–1.2)
Total Protein: 6.2 g/dL (ref 6.1–8.1)

## 2021-03-15 LAB — TROPONIN I: Troponin I: 3 ng/L (ref <=47)

## 2021-03-15 LAB — HEMOGLOBIN A1C
Hgb A1c MFr Bld: 5.9 % of total Hgb — ABNORMAL HIGH (ref <5.7)
Mean Plasma Glucose: 123 mg/dL
eAG (mmol/L): 6.8 mmol/L

## 2021-03-19 ENCOUNTER — Ambulatory Visit: Payer: Medicare HMO

## 2021-03-19 ENCOUNTER — Ambulatory Visit: Payer: Medicare HMO | Admitting: Dermatology

## 2021-03-24 ENCOUNTER — Encounter: Payer: Self-pay | Admitting: Dermatology

## 2021-03-24 ENCOUNTER — Other Ambulatory Visit: Payer: Self-pay

## 2021-03-24 ENCOUNTER — Ambulatory Visit: Payer: Medicare HMO | Admitting: Dermatology

## 2021-03-24 DIAGNOSIS — C441122 Basal cell carcinoma of skin of right lower eyelid, including canthus: Secondary | ICD-10-CM

## 2021-03-24 DIAGNOSIS — L570 Actinic keratosis: Secondary | ICD-10-CM | POA: Diagnosis not present

## 2021-03-24 DIAGNOSIS — L578 Other skin changes due to chronic exposure to nonionizing radiation: Secondary | ICD-10-CM

## 2021-03-24 NOTE — Progress Notes (Signed)
   Follow-Up Visit   Subjective  Mitchell Stephens. is a 75 y.o. male who presents for the following: Follow-up (Biopsy follow up - biopsy proven AK of left ear mid antihelix, biopsy proven BCC of right lower eyelid/lat canthus - discuss treatment options today).  The following portions of the chart were reviewed this encounter and updated as appropriate:   Tobacco  Allergies  Meds  Problems  Med Hx  Surg Hx  Fam Hx      Review of Systems:  No other skin or systemic complaints except as noted in HPI or Assessment and Plan.  Objective  Well appearing patient in no apparent distress; mood and affect are within normal limits.  A focused examination was performed including face. Relevant physical exam findings are noted in the Assessment and Plan.  Left Ear mid antihelix (biopsy proven) x 1, right temple x 2 (3) Erythematous thin papules/macules with gritty scale.   Right lower eyelid/lat canthus Healing biopsy site   Assessment & Plan   Actinic Damage - chronic, secondary to cumulative UV radiation exposure/sun exposure over time - diffuse scaly erythematous macules with underlying dyspigmentation - Recommend daily broad spectrum sunscreen SPF 30+ to sun-exposed areas, reapply every 2 hours as needed.  - Recommend staying in the shade or wearing long sleeves, sun glasses (UVA+UVB protection) and wide brim hats (4-inch brim around the entire circumference of the hat). - Call for new or changing lesions.  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 Recommend MOHs micrographic surgery. Will refer patient to Dr.[facility].  Return for Follow up as scheduled.  I, Ashok Cordia, CMA, am acting as scribe for Sarina Ser, MD .  Documentation: I have reviewed the above documentation for accuracy and completeness, and I agree with the above.  Sarina Ser, MD

## 2021-03-24 NOTE — Patient Instructions (Signed)

## 2021-03-27 NOTE — Addendum Note (Signed)
Addended by: Ashok Cordia B on: 03/27/2021 08:03 AM   Modules accepted: Orders

## 2021-04-07 DIAGNOSIS — G4733 Obstructive sleep apnea (adult) (pediatric): Secondary | ICD-10-CM | POA: Diagnosis not present

## 2021-04-08 ENCOUNTER — Telehealth: Payer: Self-pay | Admitting: Cardiovascular Disease

## 2021-04-08 NOTE — Telephone Encounter (Signed)
Attempted to schedule no ans no vm  

## 2021-04-08 NOTE — Telephone Encounter (Signed)
-----   Message from Wynema Birch, RN sent at 04/03/2021  5:04 PM EDT ----- Regarding: APPT SOON Hey ladies,  I know schedule is booked till Aug..the patient has an appt in September Need sooner appt if able, pt has not been seen since last Sept. Possible need for stress test or heart cath  APP is fine to schedule with  Any cancellation would be great, place on wait list if possible  Thanks Mandie  ----- Message ----- From: Minna Merritts, MD Sent: 04/03/2021   4:34 PM EDT To: Wynema Birch, RN  Last seen by myself September 2021 at that time I ordered a stress test for chest pain symptoms Can we call him to see what he wants to do sounds like he is still having some on and off chest pain He is welcome to do stress test or if symptoms worse could come into the office we could talk about it may need heart catheterization Thx TG ----- Message ----- From: [name], NP Sent: 03/14/2021   2:11 PM EDT To: Minna Merritts, MD  Hi [name],  I am seeing Mr [facility] in office and he has complaints of chest pains/pressure (ongoing since he saw you last) with activity. Reports he had a very "light" episode today coming into the office. He reports this episodes are generally during his morning walks, mowing his grass lasting anywhere from 5-10 mins. He says he generally walks through the pain/pressure and it will resolve as he slows down. Reports pain/pressure similar feeling prior to MI.  He rates the chest pain/pressure as mild. He admitted that he cancelled his stress test due to being scared it was not safe. I encouraged him to reschedule and reassured him it was safe. I wanted to touch base with you just to make sure you did not want to see him first or do anything different.  I repeated EKG today in office and there does not look like there is anything acute- wanted to have you look over it as well to make sure I was not missing anything.  Thank you,  [name], NP

## 2021-04-09 NOTE — Telephone Encounter (Signed)
Scheduled

## 2021-04-10 ENCOUNTER — Telehealth: Payer: Self-pay

## 2021-04-10 NOTE — Telephone Encounter (Signed)
Reach out to Mitchell Stephens regarding stress test, his PCP had reach out to Mitchell Stephens regarding his CP and SOB, pt reports "she must be over extravagating, I is not as it probably made it out to be, I am fine". Advised that Mitchell Stephens had reached out to this RN regarding pt's symptoms and suggested stress test as order previously. Mitchell Stephens placed order at his last visit in Sept 2021, pt did not get this schedule as he has fears d/t 2 friends many years ago "die during their stress test". Gave condolences to pt, went over the nuclear imaging test, advised that there are providers within the area, but currently not in the room during the test, worse case scenario there is a [facility] [employer] team and rapid response team for any adverse events.  Mitchell Stephens verbalized understanding, will proceed with stress test at this time.  Went over instructions and medications, will send copy in the mail and through [place]. Transfer pt to scheduling to set date and time.    [facility] Veterinary surgeon)  Your caregiver has ordered a Stress Test with nuclear imaging. The purpose of this test is to evaluate the blood supply to your heart muscle. This procedure is referred to as a "Non-Invasive Stress Test." This is because other than having an IV started in your vein, nothing is inserted or "invades" your body. Cardiac stress tests are done to find areas of poor blood flow to the heart by determining the extent of coronary artery disease (CAD). Some patients exercise on a treadmill, which naturally increases the blood flow to your heart, while others who are  unable to walk on a treadmill due to physical limitations have a pharmacologic/chemical stress agent called Lexiscan . This medicine will mimic walking on a treadmill by temporarily increasing your coronary blood flow.   Please note: these test may take anywhere between 2-4 hours to complete  PLEASE REPORT TO [place] AT THE FIRST DESK  WILL DIRECT YOU WHERE TO GO  Instructions regarding medication:   ____ : Hold diabetes medication morning of procedure None  _X__:  Hold betablocker(s) night before procedure and morning of procedure Propranolol Metoprolol  _X__:  Hold other medications as follows: Hydrochlorothiazide  How to prepare for your Myoview test:  Do not eat or drink after midnight No caffeine for 24 hours prior to test No smoking 24 hours prior to test. ALL your medication may be taken with a few sips of water.   (Except for the meds mention above) Please wear a short sleeve shirt. Comfortable pants are appropriate. No perfume, cologne or lotion.   PLEASE NOTIFY THE OFFICE AT LEAST 83 HOURS IN ADVANCE IF YOU ARE UNABLE TO KEEP YOUR APPOINTMENT.  [phone] AND  PLEASE NOTIFY NUCLEAR MEDICINE AT [facility] AT LEAST 24 HOURS IN ADVANCE IF YOU ARE UNABLE TO KEEP YOUR APPOINTMENT. [phone]

## 2021-04-11 ENCOUNTER — Ambulatory Visit: Payer: Medicare HMO | Admitting: Cardiovascular Disease

## 2021-04-14 ENCOUNTER — Other Ambulatory Visit: Payer: Self-pay

## 2021-04-14 ENCOUNTER — Encounter
Admission: RE | Admit: 2021-04-14 | Discharge: 2021-04-14 | Disposition: A | Discharge status: Home / Self Care | Payer: Medicare HMO | Source: Ambulatory Visit | Attending: Cardiovascular Disease | Admitting: Cardiovascular Disease

## 2021-04-14 DIAGNOSIS — I25118 Atherosclerotic heart disease of native coronary artery with other forms of angina pectoris: Secondary | ICD-10-CM

## 2021-04-14 DIAGNOSIS — I209 Angina pectoris, unspecified: Secondary | ICD-10-CM | POA: Diagnosis not present

## 2021-04-14 LAB — NM MYOCAR MULTI W/SPECT W/WALL MOTION / EF
LV dias vol: 70 mL (ref 62–150)
LV sys vol: 25 mL
MPHR: 145 {beats}/min
Peak HR: 83 {beats}/min
Percent HR: 57 %
Rest HR: 61 {beats}/min
SDS: 7
SRS: 2
SSS: 5
TID: 1.22

## 2021-04-14 MED ORDER — REGADENOSON 0.4 MG/5ML IV SOLN
0.4000 mg | Freq: Once | INTRAVENOUS | Status: AC
  Administered 2021-04-14: 0.4 mg via INTRAVENOUS

## 2021-04-14 MED ORDER — TECHNETIUM TC 99M TETROFOSMIN IV KIT
10.0000 | PACK | Freq: Once | INTRAVENOUS | Status: AC | PRN
  Administered 2021-04-14: 9.84 via INTRAVENOUS

## 2021-04-14 MED ORDER — TECHNETIUM TC 99M TETROFOSMIN IV KIT
30.0000 | PACK | Freq: Once | INTRAVENOUS | Status: AC | PRN
  Administered 2021-04-14: 33.6 via INTRAVENOUS

## 2021-04-15 ENCOUNTER — Other Ambulatory Visit: Payer: Self-pay | Admitting: Nurse Practitioner

## 2021-04-15 DIAGNOSIS — M1A09X0 Idiopathic chronic gout, multiple sites, without tophus (tophi): Secondary | ICD-10-CM

## 2021-04-15 NOTE — Telephone Encounter (Signed)
Pharmacy requested refill. Pended Rx and sent to [name] for approval due to HIGH ALERT Warning.  

## 2021-04-21 ENCOUNTER — Ambulatory Visit: Payer: Medicare HMO

## 2021-04-21 ENCOUNTER — Telehealth: Payer: Self-pay

## 2021-04-21 NOTE — Telephone Encounter (Signed)
Patient called to speak with [name] about a scan he had scheduled and stated that he wad going to send her a message about what was going on with the scan. I advised patient that she will be out the office until next week and Marlowe Sax, NP was responding to her patient while she is out the office and patient states that he will wait for [name] to return to the office next week to send a MyChart message.

## 2021-04-26 ENCOUNTER — Other Ambulatory Visit: Payer: Self-pay | Admitting: Nurse Practitioner

## 2021-04-26 DIAGNOSIS — M1A09X0 Idiopathic chronic gout, multiple sites, without tophus (tophi): Secondary | ICD-10-CM

## 2021-04-28 ENCOUNTER — Telehealth: Payer: Self-pay | Admitting: *Deleted

## 2021-04-28 NOTE — Telephone Encounter (Signed)
Left voicemail message to call back for review of results.  

## 2021-04-28 NOTE — Telephone Encounter (Signed)
-----   Message from Minna Merritts, MD sent at 04/27/2021 11:57 AM EDT ----- Stress test  Results suggest blockage, most likely the right coronary artery Has appt 8/5 Would recommend cardiac cath discussion on that visit Thx TG

## 2021-04-29 NOTE — Telephone Encounter (Signed)
Patient returning call.

## 2021-04-29 NOTE — Telephone Encounter (Signed)
Spoke with patient and reviewed results and recommendations. He did question if the SDS and SRS were calculated correctly. He states that it is his understanding that the [place] would be 5+2 and in this case he noted that it was subtracted resulting in 3 when he thought it would be 7. Reviewed that his upcoming appointment would be to discuss results but he did want to clarify these numbers. Advised that I would forward to Dr. Rockey Situ for his review and clarification and would be in touch once he reviews. He verbalized understanding with no further questions.

## 2021-05-05 NOTE — Telephone Encounter (Signed)
Left detail message on VM of cell phone, okay by DPR, following up on pervious phone encounter involving post Lexiscan positive results, Dr. Rockey Situ advised   "Test looks positive for inferior wall ischemia  Options will include invasive testing with cardiac catheterization, medical management, second test to confirm such as cardiac CTA  TG"  Advise have appt this Friday with Cadence Furth, PA-C (Dr. Rockey Situ will be in clinic 8/5), will discuss further at his appt on which testing option would be best based on his positive stress test.

## 2021-05-06 ENCOUNTER — Telehealth: Payer: Self-pay | Admitting: Nurse Practitioner

## 2021-05-06 NOTE — Telephone Encounter (Signed)
Mitchell Stephens, can you follow up on this, it should have not been denied since he has a 40 year smoking history. Thank you. He has also been approved to have the CT in the past.

## 2021-05-06 NOTE — Telephone Encounter (Signed)
Noted, thank you

## 2021-05-06 NOTE — Telephone Encounter (Signed)
I have given it to [place] to follow up. Not sure if she got it Preauthorized or not when scheduled.

## 2021-05-07 NOTE — Progress Notes (Signed)
Cardiology Office Note:    Date:  05/09/2021   ID:  Mitchell Frame., DOB Aug 25, 1946, MRN [id number]  PCP:  [name], NP  [facility] Cardiologist:  Dr[facility] Electrophysiologist:  None   Referring MD: [name], NP   Chief Complaint: Stress test follow-up  History of Present Illness:    Mitchell Frame. is a 75 y.o. male with a hx of CAD occluded pRCA, h/o smoking, OSA on CAP, HTN, HLD, former alcoholic, tachycardia related to alcohol.   Patient was first seen back in 2018 reporting chest pain and shortness of breath.  Underwent cardiac cath 06/2017. This showed 30% proximal LAD lesion, 50% proximal circumflex lesion, 100% proximal RCA lesion, LVEF 35 to 45%.  Occluded RCA had collaterals from left to right.  Medical management was recommended.  Echo at that time showed improved pump function LVEF 50 to [id number], grade 1 diastolic dysfunction, moderate MR, mildly dilated left atrium.  Seen 06/24/20 and reported chest pain and a Myoview was ordered. The patient did not want to undergo stress test so this was deferred at the time. More recently the patient noted worsening chest pressure on exertion and requested to undergo stress trest. MPI showed large defect of moderate severity in the basal inferior, mid inferior and apical location, consistent with ischemia, EF 55-60%, intermediate risk study  Today, the stress test was reviewed with the patient and I also reviewed images/result with Dr. Rockey Situ. The patient reported the chest pressure he was having has since gone back to baseline. He has chronic chest pain with exertion. Center of the chest, no-radiating, gets up to a 4/10. Has taken SL NTG, took it 3 weeks, it improved the pain. More of an ache pain. He is able to walk 2 miles, but has chronic exertional pain. He has shortness of breath with exertion, but this is unchanged. No significant swelling, orthopnea, pnd. No palpitations.   Past  Medical History:  Diagnosis Date   Actinic keratosis 02/10/2021   left ear mid antihelix   Alcoholism ([place])    per pt   Allergy    Basal cell carcinoma 05/13/2020   left prox medial pretibial    Basal cell carcinoma 08/26/2020   below the left medial knee - tx with ED&C    Basal cell carcinoma 02/10/2021   Right lower eyelid/lat canthus - needs treatment   Coronary artery disease    a. 06/2017 MV: EF 45%, large inf and apical defect w/o ischemia;  b. 06/2017 Cath: LM nl, LAD 30ost, LCX 50p, OM1/2 ok, RCA 100p w/ L->R collats. EF 35%.   Depression with anxiety    Gastric ulcer, acute with hemorrhage 2001   Dr. Sharlet Salina, [facility]   H/O congenital atrial septal defect (ASD) repair    Heart murmur    ASD repair    Helicobacter pylori gastritis    Heme positive stool    History of gout    History of peptic ulcer disease    had bleeding ulcers requiring transfusion   History of PSVT (paroxysmal supraventricular tachycardia)    Hyperlipidemia    Hypertension    Ischemic cardiomyopathy    a. 06/2017 LV gram: EF 35%.   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 Annamaria Boots ([facility]); was PFO repair--has been normal in f/u   PALATE / UVULA BIOPSY / [facility])   [place]   UVULECTOMY      Current Medications: Current Meds  Medication  Sig   aspirin EC 81 MG tablet Take 81 mg by mouth daily.   buPROPion (WELLBUTRIN XL) 300 MG 24 hr tablet Take 1 tablet (300 mg total) by mouth daily.   colchicine 0.6 MG tablet Take 0.6 mg by mouth as needed.   ezetimibe (ZETIA) 10 MG tablet TAKE ONE TABLET BY MOUTH DAILY   hydrochlorothiazide (HYDRODIURIL) 25 MG tablet TAKE 1 TABLET (25 MG TOTAL) BY MOUTH DAILY AS NEEDED.   isosorbide mononitrate (IMDUR) 30 MG 24 hr tablet TAKE 0.5 TABLETS (15 MG TOTAL) BY MOUTH DAILY.   losartan (COZAAR) 25 MG tablet Take 1 tablet (25 mg total) by mouth daily.   metoprolol succinate (TOPROL-XL) 100 MG 24 hr tablet TAKE 1 TABLET BY MOUTH EVERY DAY   nitroGLYCERIN (NITROSTAT) 0.4 MG SL tablet Place 1 tablet (0.4 mg total) under the tongue every 5 (five) minutes as needed for chest pain.   propranolol (INDERAL) 20 MG tablet TAKE 1 TABLET (20 MG TOTAL) BY MOUTH AS NEEDED (AS NEEDED FOR FAST HEART RATES).   senna (SENOKOT) 8.6 MG tablet Take 2 tablets by mouth at bedtime.   simvastatin (ZOCOR) 40 MG tablet Take 1 tablet (40 mg total) by mouth daily.   spironolactone (ALDACTONE) 25 MG tablet TAKE 1 TABLET BY MOUTH EVERY DAY   traZODone (DESYREL) 50 MG tablet TAKE 1 TABLET (50 MG TOTAL) BY MOUTH AT BEDTIME AS NEEDED. FOR INSOMNIA     Allergies:   Pollen extract   Social History   Socioeconomic History   Marital status: Married    Spouse name: Not on file   Number of children: 1   Years of education: Not on file   Highest education level: Not on file  Occupational History   Occupation: retired  Tobacco Use   Smoking status: Former    Years: 40.00    Types: Cigarettes    Quit date: 09/04/2015    Years since quitting: 5.6   Smokeless tobacco: Never  Vaping Use   Vaping Use: Never used  Substance and Sexual Activity   Alcohol use: No    Alcohol/week: 0.0 standard drinks    Comment: previously did drink alcohol heavily   Drug use: No    Comment: likes to avoid pain meds, no illicit drugs used    Sexual activity: Yes  Other Topics Concern   Not on file  Social History Narrative   Drinks caffeine   Married in 1973   Lives in house   Two story home, 2 live in home   [place] and does yardwork   Has living will and [employer] [facility]   Social Determinants of Health   Financial Resource Strain: Not  on file  Food Insecurity: Not on file  Transportation Needs: Not on file  Physical Activity: Not on file  Stress: Not on file  Social Connections: Not on file     Family History: The patient's family history includes Arthritis in his mother; Brain cancer in his father; Cancer in his paternal grandfather; Failure to thrive in his mother; Hypertension in his sister; Liver cancer in his paternal uncle; Other in his mother and another family member; Stomach cancer in his maternal grandmother. There is no history of Colon cancer, Colon polyps, Esophageal cancer, or Rectal cancer.  ROS:   Please see the history of present illness.     All other systems reviewed and are negative.  EKGs/Labs/Other Studies Reviewed:    The following studies were reviewed today:   Cardiac cath 06/2017 Prox LAD lesion, 30 %stenosed. Prox Cx lesion, 50 %stenosed. Prox RCA lesion, 100 %stenosed. The left ventricular ejection fraction is 35-45% by visual estimate.  Final Conclusions:   Occluded RCA, collaterals from left to right Nonobstructive disease of the LAD and LCX EF 35%  Recommendations:  Medical management, Asa, start metoprolol succinate 50 mg daily, continue simva  As outpt consider startiing ranexa 500 BID 1 week then 1000 BID Will try to wean down on  Ca channel blocker as outpt Cardiac rehab, Consider evaluation by [place] interventional if having anginal sx.   Ida Rogue 06/30/2017, 9:55 AM  Echo 09/2017 Study Conclusions   - Left ventricle: The cavity size was normal. There was mild    concentric hypertrophy. Systolic function was normal. The    estimated ejection fraction  was in the range of 50% to 55%.    Images were inadequate for LV wall motion assessment. Doppler    parameters are consistent with abnormal left ventricular    relaxation (grade 1 diastolic dysfunction).  - Mitral valve: Moderately calcified annulus. The findings are    consistent with trivial stenosis. There was moderate    regurgitation. Mean gradient (D): 2 mm Hg. Valve area by pressure    half-time: 2.47 cm^2.  - Left atrium: The atrium was mildly dilated.  - Tricuspid valve: There was trivial regurgitation.  - Pulmonary arteries: Systolic pressure was within the normal    range.   EKG:  EKG is  ordered today.  The ekg ordered today demonstrates NSR 62bpm, 1st degree AV block, nonspecific t wave changes  Recent Labs: 03/14/2021: ALT 21; BUN 25; Creat 1.51; Hemoglobin 13.6; Platelets 300; Potassium 4.8; Sodium 137  Recent Lipid Panel    Component Value Date/Time   CHOL 124 03/14/2021 0000   CHOL 128 01/09/2016 0825   TRIG 150 (H) 03/14/2021 0000   HDL 45 03/14/2021 0000   HDL 43 01/09/2016 0825   CHOLHDL 2.8 03/14/2021 0000   VLDL 20 08/07/2016 1005   LDLCALC 56 03/14/2021 0000    Physical Exam:    VS:  BP 100/60 (BP Location: Left Arm, Patient Position: Sitting, Cuff Size: Normal)   Pulse 62   Ht '6\' 1"'$  (1.854 m)   Wt 211 lb 6 oz (95.9 kg)   SpO2 98%   BMI 27.89 kg/m     Wt Readings from Last 3 Encounters:  05/09/21 211 lb 6 oz (95.9 kg)  03/14/21 212 lb 3.2 oz (96.3 kg)  01/22/21 210 lb (95.3 kg)     GEN:  Well nourished, well developed in no acute distress HEENT: Normal NECK: No JVD; No carotid bruits LYMPHATICS: No lymphadenopathy CARDIAC: RRR, no  murmurs, rubs, gallops RESPIRATORY:  Clear to auscultation without rales, wheezing or rhonchi  ABDOMEN: Soft, non-tender, non-distended MUSCULOSKELETAL:  No edema; No deformity  SKIN: Warm and dry NEUROLOGIC:  Alert and oriented x 3 PSYCHIATRIC:  Normal affect   ASSESSMENT:    1. Abnormal stress test   2.  Essential hypertension, benign   3. Atherosclerosis of native coronary artery of native heart with stable angina pectoris ([place])   4. Ischemic cardiomyopathy   5. Paroxysmal SVT (supraventricular tachycardia) (H[facility])   6. Mixed hyperlipidemia   7. Chronic systolic heart failure ([place])   8. Stable angina (H[facility])    PLAN:    In order of problems listed above:  Abnormal Stress test CAD with known CTO RCA with collaterals A couple months ago the patient had worsening angina and underwent MPI (that had been previously ordered by Dr. Rockey Situ 06/2020). Stress showed inducible ischemia inferior region with normal EF, recommendations was for possible cardiac cath. Patient had a lot of questions. I spoke with Dr. Rockey Situ and prior stress test and cath reviewed from 2019. It may be the new changes on recent MPI are from new collaterals, ultimately have to decide based off symptoms. The patient reported he was at his baseline, he has chronic chest pressure with exertion that he has had for 4 years. He takes SL NTG as needed. He feels symptoms are tolerable. EKG with no new ischemic changes. After long discussion plan to continue with medical management. He is on Imdur '15mg'$ , cannot increase due to borderline pressures. Many suggestions were made regarding de-escalation of medications for escalation of Imdur vs addition of Ranexa. Patient not wanting to change medications at this point. Patient will call us if angina worsens. He has SL NTG Plan for follow-up in 6 months.   HFimpEF ICM prior EF 35% in 2018, 55-65% on recent MPI Patient is euvolemic on exam. He has unchanged dyspnea on exertion. Continue spironolactone, losartan, HCTZ, Toprol.   HTN BP borderline. He is on spironolactone, HCTZ, Losartan, Toprol, and Imdur. Cannot up titrate Imdur with borderline pressures. Discussed discontinue HCTZ vs de-escalation of other mediations to up-titrate Imdur, but this was denied by the patient.   HLD LDL 56 03/2021.  Continue statin and Zetia.    Disposition: Follow up in 6 month(s) with MD    Signed, Janny Crute Ninfa Meeker, PA-C  05/09/2021 1:01 PM    [facility]

## 2021-05-09 ENCOUNTER — Ambulatory Visit: Payer: Medicare HMO | Admitting: Medical

## 2021-05-09 ENCOUNTER — Other Ambulatory Visit: Payer: Self-pay

## 2021-05-09 ENCOUNTER — Encounter: Payer: Self-pay | Admitting: Medical

## 2021-05-09 VITALS — BP 100/60 | HR 62 | Ht 73.0 in | Wt 211.4 lb

## 2021-05-09 DIAGNOSIS — E782 Mixed hyperlipidemia: Secondary | ICD-10-CM | POA: Diagnosis not present

## 2021-05-09 DIAGNOSIS — I208 Other forms of angina pectoris: Secondary | ICD-10-CM

## 2021-05-09 DIAGNOSIS — I25118 Atherosclerotic heart disease of native coronary artery with other forms of angina pectoris: Secondary | ICD-10-CM | POA: Diagnosis not present

## 2021-05-09 DIAGNOSIS — I255 Ischemic cardiomyopathy: Secondary | ICD-10-CM | POA: Diagnosis not present

## 2021-05-09 DIAGNOSIS — I471 Supraventricular tachycardia: Secondary | ICD-10-CM | POA: Diagnosis not present

## 2021-05-09 DIAGNOSIS — I10 Essential (primary) hypertension: Secondary | ICD-10-CM | POA: Diagnosis not present

## 2021-05-09 DIAGNOSIS — R9439 Abnormal result of other cardiovascular function study: Secondary | ICD-10-CM | POA: Diagnosis not present

## 2021-05-09 DIAGNOSIS — I5022 Chronic systolic (congestive) heart failure: Secondary | ICD-10-CM | POA: Diagnosis not present

## 2021-05-09 NOTE — Patient Instructions (Signed)
Medication Instructions:  NONE *If you need a refill on your cardiac medications before your next appointment, please call your pharmacy*   Lab Work: NONE If you have labs (blood work) drawn today and your tests are completely normal, you will receive your results only by: [place] (if you have MyChart) OR A paper copy in the mail If you have any lab test that is abnormal or we need to change your treatment, we will call you to review the results.   Testing/Procedures: NONE   Follow-Up: At [facility], you and your health needs are our priority.  As part of our continuing mission to provide you with exceptional heart care, we have created designated Provider Care Teams.  These Care Teams include your primary Cardiologist (physician) and Advanced Practice Providers (APPs -  Physician Assistants and Nurse Practitioners) who all work together to provide you with the care you need, when you need it.  We recommend signing up for the patient portal called "MyChart".  Sign up information is provided on this After Visit Summary.  MyChart is used to connect with patients for Virtual Visits (Telemedicine).  Patients are able to view lab/test results, encounter notes, upcoming appointments, etc.  Non-urgent messages can be sent to your provider as well.   To learn more about what you can do with MyChart, go to [email or web address].    Your next appointment:   6 month(s)  The format for your next appointment:   In Person  Provider:   Ida Rogue, MD   Other Instructions

## 2021-05-16 ENCOUNTER — Other Ambulatory Visit: Payer: Self-pay | Admitting: Cardiovascular Disease

## 2021-06-19 ENCOUNTER — Other Ambulatory Visit: Payer: Self-pay | Admitting: Nurse Practitioner

## 2021-06-19 ENCOUNTER — Other Ambulatory Visit: Payer: Self-pay | Admitting: Cardiovascular Disease

## 2021-06-19 MED ORDER — HYDROCHLOROTHIAZIDE 25 MG PO TABS: ORAL_TABLET | ORAL | 1 refills | Status: DC

## 2021-06-19 NOTE — Telephone Encounter (Signed)
Pharmacy requested refills

## 2021-06-24 ENCOUNTER — Ambulatory Visit: Payer: Medicare HMO | Admitting: Cardiovascular Disease

## 2021-06-25 ENCOUNTER — Ambulatory Visit: Payer: Medicare HMO

## 2021-06-25 ENCOUNTER — Other Ambulatory Visit: Payer: Self-pay

## 2021-06-25 DIAGNOSIS — L570 Actinic keratosis: Secondary | ICD-10-CM | POA: Diagnosis not present

## 2021-06-25 MED ORDER — AMINOLEVULINIC ACID HCL 20 % EX SOLR
1.0000 "application " | Freq: Once | CUTANEOUS | Status: AC
  Administered 2021-06-25: 354 mg via TOPICAL

## 2021-06-25 NOTE — Progress Notes (Deleted)
1. AK (actinic keratosis) Head - Anterior (Face)  Photodynamic therapy - Head - Anterior (Face) Procedure discussed: discussed risks, benefits, side effects. and alternatives   Prep: site scrubbed/prepped with acetone   Location:  Face Number of lesions:  Multiple Type of treatment:  Blue light Aminolevulinic Acid (see MAR for details): Levulan Number of Levulan sticks used:  1 Incubation time (minutes):  60 Number of minutes under lamp:  16 Number of seconds under lamp:  40 Cooling:  Floor fan Outcome: patient tolerated procedure well with no complications   Post-procedure details: sunscreen applied and aftercare instructions given to patient    Aminolevulinic Acid HCl 20 % SOLR 354 mg - Head - Anterior (Face)      

## 2021-06-25 NOTE — Progress Notes (Signed)
1. AK (actinic keratosis) Head - Anterior (Face)  Photodynamic therapy - Head - Anterior (Face) Procedure discussed: discussed risks, benefits, side effects. and alternatives   Prep: site scrubbed/prepped with acetone   Location:  Face Number of lesions:  Multiple Type of treatment:  Blue light Aminolevulinic Acid (see MAR for details): Levulan Number of Levulan sticks used:  1 Incubation time (minutes):  60 Number of minutes under lamp:  16 Number of seconds under lamp:  40 Cooling:  Floor fan Outcome: patient tolerated procedure well with no complications   Post-procedure details: sunscreen applied and aftercare instructions given to patient    Aminolevulinic Acid HCl 20 % SOLR 354 mg - Head - Anterior (Face)      

## 2021-06-25 NOTE — Patient Instructions (Addendum)

## 2021-07-04 ENCOUNTER — Other Ambulatory Visit: Payer: Self-pay | Admitting: *Deleted

## 2021-07-04 MED ORDER — EZETIMIBE 10 MG PO TABS
10.0000 mg | ORAL_TABLET | Freq: Every day | ORAL | 1 refills | Status: DC
Start: 2021-07-04 — End: 2021-07-08

## 2021-07-04 NOTE — Telephone Encounter (Signed)
Patient requested refill

## 2021-07-07 ENCOUNTER — Telehealth: Payer: Self-pay | Admitting: Cardiovascular Disease

## 2021-07-07 NOTE — Telephone Encounter (Signed)
Pt c/o medication issue:  1. Name of Medication:   ezetimibe (ZETIA) 10 MG tablet    2. How are you currently taking this medication (dosage and times per day)? Take 0.6 mg by mouth as needed.  3. Are you having a reaction (difficulty breathing--STAT)? no  4. What is your medication issue? pt would like to know if there is an alternative medication he can take as his insurance has currently removed Zetia from the formulary and are charging a ridiculously high amount for the Eztimibe... please advise.

## 2021-07-08 MED ORDER — EZETIMIBE 10 MG PO TABS: 10.0000 mg | ORAL_TABLET | Freq: Every day | ORAL | 3 refills | Status: DC

## 2021-07-08 NOTE — Telephone Encounter (Signed)
Patient is returning the call from yesterday

## 2021-07-08 NOTE — Telephone Encounter (Signed)
Able to call Mitchell Stephens back regarding his Zetia. Pt reports insurance called to inform pt that Zetia was moved to a Tier 4 and would cost $1,000 for a 3 month supply. Even with generic brand ezetimibe, it would still be very costly as both are consider Tier 4.  GoodRx shows ezetimibe 10 mg for 90 tabs at [facility] for $18.54 Pt request script to be sent there and till pick up in the am. Pt thankful for call, will call back tomorrow if any issues with script and cost.

## 2021-07-09 ENCOUNTER — Other Ambulatory Visit: Payer: Self-pay | Admitting: *Deleted

## 2021-07-09 MED ORDER — BUPROPION HCL ER (XL) 300 MG PO TB24: 300.0000 mg | ORAL_TABLET | Freq: Every day | ORAL | 1 refills | Status: DC

## 2021-07-09 NOTE — Telephone Encounter (Signed)
Patient requested refill. Stated that he is going out of town tomorrow and will not have enough medication.  Pended Rx and sent to [facility] for approval due to [name] out of office.  Pended due to [place] Warning.

## 2021-07-14 ENCOUNTER — Ambulatory Visit: Payer: Medicare HMO | Admitting: Dermatology

## 2021-07-23 ENCOUNTER — Other Ambulatory Visit: Payer: Self-pay | Admitting: Nurse Practitioner

## 2021-07-23 NOTE — Telephone Encounter (Signed)
Patient has request refill on medication "Spironolactone". Patient last refill was 12/30/2020 with 90 tablets and 1 refill. Patient medication has High risk warning. Medication pend and sent to PCP Dewaine Oats Carlos American, NP for approval.

## 2021-07-25 ENCOUNTER — Ambulatory Visit (INDEPENDENT_AMBULATORY_CARE_PROVIDER_SITE_OTHER): Payer: Medicare HMO | Admitting: *Deleted

## 2021-07-25 ENCOUNTER — Other Ambulatory Visit: Payer: Self-pay

## 2021-07-25 DIAGNOSIS — Z23 Encounter for immunization: Secondary | ICD-10-CM

## 2021-08-07 ENCOUNTER — Ambulatory Visit: Payer: Medicare HMO | Admitting: Dermatology

## 2021-08-07 DIAGNOSIS — C441122 Basal cell carcinoma of skin of right lower eyelid, including canthus: Secondary | ICD-10-CM | POA: Diagnosis not present

## 2021-08-11 DIAGNOSIS — G4733 Obstructive sleep apnea (adult) (pediatric): Secondary | ICD-10-CM | POA: Diagnosis not present

## 2021-08-13 ENCOUNTER — Ambulatory Visit: Payer: Medicare HMO | Admitting: Dermatology

## 2021-08-13 ENCOUNTER — Encounter: Payer: Self-pay | Admitting: Dermatology

## 2021-08-13 ENCOUNTER — Other Ambulatory Visit: Payer: Self-pay

## 2021-08-13 DIAGNOSIS — D045 Carcinoma in situ of skin of trunk: Secondary | ICD-10-CM | POA: Diagnosis not present

## 2021-08-13 DIAGNOSIS — Z85828 Personal history of other malignant neoplasm of skin: Secondary | ICD-10-CM

## 2021-08-13 DIAGNOSIS — L578 Other skin changes due to chronic exposure to nonionizing radiation: Secondary | ICD-10-CM

## 2021-08-13 DIAGNOSIS — L570 Actinic keratosis: Secondary | ICD-10-CM

## 2021-08-13 DIAGNOSIS — Z1283 Encounter for screening for malignant neoplasm of skin: Secondary | ICD-10-CM | POA: Diagnosis not present

## 2021-08-13 DIAGNOSIS — D485 Neoplasm of uncertain behavior of skin: Secondary | ICD-10-CM

## 2021-08-13 NOTE — Patient Instructions (Signed)
Wound Care Instructions  Cleanse wound gently with soap and water once a day then pat dry with clean gauze. Apply a thing coat of Petrolatum (petroleum jelly, "Vaseline") over the wound (unless you have an allergy to this). We recommend that you use a new, sterile tube of Vaseline. Do not pick or remove scabs. Do not remove the yellow or white "healing tissue" from the base of the wound.  Cover the wound with fresh, clean, nonstick gauze and secure with paper tape. You may use Band-Aids in place of gauze and tape if the would is small enough, but would recommend trimming much of the tape off as there is often too much. Sometimes Band-Aids can irritate the skin.  You should call the office for your biopsy report after 1 week if you have not already been contacted.  If you experience any problems, such as abnormal amounts of bleeding, swelling, significant bruising, significant pain, or evidence of infection, please call the office immediately.  FOR ADULT SURGERY PATIENTS: If you need something for pain relief you may take 1 extra strength Tylenol (acetaminophen) AND 2 Ibuprofen (200mg each) together every 4 hours as needed for pain. (do not take these if you are allergic to them or if you have a reason you should not take them.) Typically, you may only need pain medication for 1 to 3 days.    Cryotherapy Aftercare  Wash gently with soap and water everyday.   Apply Vaseline and Band-Aid daily until healed.    If you have any questions or concerns for your doctor, please call our main line at [phone] and press option 4 to reach your doctor's medical assistant. If no one answers, please leave a voicemail as directed and we will return your call as soon as possible. Messages left after 4 pm will be answered the following business day.   You may also send us a message via MyChart. We typically respond to MyChart messages within 1-2 business days.  For prescription refills, please ask your  pharmacy to contact our office. Our fax number is [phone].  If you have an urgent issue when the clinic is closed that cannot wait until the next business day, you can page your doctor at the number below.    Please note that while we do our best to be available for urgent issues outside of office hours, we are not available 24/7.   If you have an urgent issue and are unable to reach us, you may choose to seek medical care at your doctor's office, retail clinic, urgent care center, or emergency room.  If you have a medical emergency, please immediately call 911 or go to the emergency department.  Pager Numbers  - Dr. Kowalski: [phone]  - Dr. Moye: [phone]  - Dr. Stewart: [phone]  In the event of inclement weather, please call our main line at [phone] for an update on the status of any delays or closures.  Dermatology Medication Tips: Please keep the boxes that topical medications come in in order to help keep track of the instructions about where and how to use these. Pharmacies typically print the medication instructions only on the boxes and not directly on the medication tubes.   If your medication is too expensive, please contact our office at [phone] option 4 or send us a message through MyChart.   We are unable to tell what your co-pay for medications will be in advance as this is different depending on your insurance coverage. However,   we may be able to find a substitute medication at lower cost or fill out paperwork to get insurance to cover a needed medication.   If a prior authorization is required to get your medication covered by your insurance company, please allow us 1-2 business days to complete this process.  Drug prices often vary depending on where the prescription is filled and some pharmacies may offer cheaper prices.  The website www.goodrx.com contains coupons for medications through different pharmacies. The prices here do not  account for what the cost may be with help from insurance (it may be cheaper with your insurance), but the website can give you the price if you did not use any insurance.  - You can print the associated coupon and take it with your prescription to the pharmacy.  - You may also stop by our office during regular business hours and pick up a GoodRx coupon card.  - If you need your prescription sent electronically to a different pharmacy, notify our office through [place] MyChart or by phone at [phone] option 4.  

## 2021-08-13 NOTE — Progress Notes (Signed)
   Follow-Up Visit   Subjective  Mitchell Stephens. is a 75 y.o. male who presents for the following: Actinic Keratosis (4 month follow up - face treated with LN2 and PDT), Other (Spot on right chest), and Basal Cell Carcinoma (Right lower eyelid/lat canthus treated with [facility] 08/07/2021 (Dr Lacinda Axon)). The patient presents for Upper Body Skin Exam (UBSE) for skin cancer screening and mole check.  The following portions of the chart were reviewed this encounter and updated as appropriate:   Tobacco  Allergies  Meds  Problems  Med Hx  Surg Hx  Fam Hx     Review of Systems:  No other skin or systemic complaints except as noted in HPI or Assessment and Plan.  Objective  Well appearing patient in no apparent distress; mood and affect are within normal limits.  A focused examination was performed including face, chest. Relevant physical exam findings are noted in the Assessment and Plan.  Right lower eyelid/lat canthus Healing MOHs site  Right lat pectoral Pink flat papule 1.2 cm        Nose x 2, right ear x 1 (3) Erythematous thin papules/macules with gritty scale.    Assessment & Plan   Actinic Damage - chronic, secondary to cumulative UV radiation exposure/sun exposure over time - diffuse scaly erythematous macules with underlying dyspigmentation - Recommend daily broad spectrum sunscreen SPF 30+ to sun-exposed areas, reapply every 2 hours as needed.  - Recommend staying in the shade or wearing long sleeves, sun glasses (UVA+UVB protection) and wide brim hats (4-inch brim around the entire circumference of the hat). - Call for new or changing lesions.  History of basal cell carcinoma (BCC) -status post Mohs Right lower eyelid/lat canthus Keep follow up with Dr. Lacinda Axon 08/14/2021  Neoplasm of uncertain behavior of skin Right lat pectoral Skin / nail biopsy Type of biopsy: tangential   Informed consent: discussed and consent obtained   Timeout: patient name, date of  birth, surgical site, and procedure verified   Procedure prep:  Patient was prepped and draped in usual sterile fashion Prep type:  Isopropyl alcohol Anesthesia: the lesion was anesthetized in a standard fashion   Anesthetic:  1% lidocaine w/ epinephrine 1-100,000 buffered w/ 8.4% NaHCO3 Instrument used: flexible razor blade   Hemostasis achieved with: pressure, aluminum chloride and electrodesiccation   Outcome: patient tolerated procedure well   Post-procedure details: sterile dressing applied and wound care instructions given   Dressing type: bandage and petrolatum    Specimen 1 - Surgical pathology Differential Diagnosis: R/O BCC Check Margins: No  AK (actinic keratosis) (3) Nose x 2, right ear x 1 Destruction of lesion - Nose x 2, right ear x 1 Complexity: simple   Destruction method: cryotherapy   Informed consent: discussed and consent obtained   Timeout:  patient name, date of birth, surgical site, and procedure verified Lesion destroyed using liquid nitrogen: Yes   Region frozen until ice ball extended beyond lesion: Yes   Outcome: patient tolerated procedure well with no complications   Post-procedure details: wound care instructions given    Return in about 3 months (around 11/13/2021) for AK follow up.  I, Ashok Cordia, CMA, am acting as scribe for Sarina Ser, MD . Documentation: I have reviewed the above documentation for accuracy and completeness, and I agree with the above.  Sarina Ser, MD

## 2021-08-14 DIAGNOSIS — C441122 Basal cell carcinoma of skin of right lower eyelid, including canthus: Secondary | ICD-10-CM | POA: Diagnosis not present

## 2021-08-15 ENCOUNTER — Telehealth: Payer: Self-pay

## 2021-08-15 NOTE — Telephone Encounter (Signed)
Patient informed of pathology results and appointment scheduled.  °

## 2021-08-15 NOTE — Telephone Encounter (Signed)
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

## 2021-08-19 IMAGING — CT CT CHEST LUNG CANCER SCREENING LOW DOSE W/O CM
1 of 2 series · 10 of 20 positions shown, 13 images · non-contrast
Comparison: No priors.

CLINICAL DATA: 72-year-old male former smoker (quit 4 years ago)
with 50 pack-year history of smoking. Lung cancer screening
examination.

EXAM:
CT CHEST WITHOUT CONTRAST LOW-DOSE FOR LUNG CANCER SCREENING
TECHNIQUE: Multidetector CT imaging of the chest was performed following the
standard protocol without IV contrast.

[ct lung segmentation data · axial · 0.88mm/px · z∈[-382,-382]mm · 10 of 370 frames shown]
[frame 1/370  mediastinal]
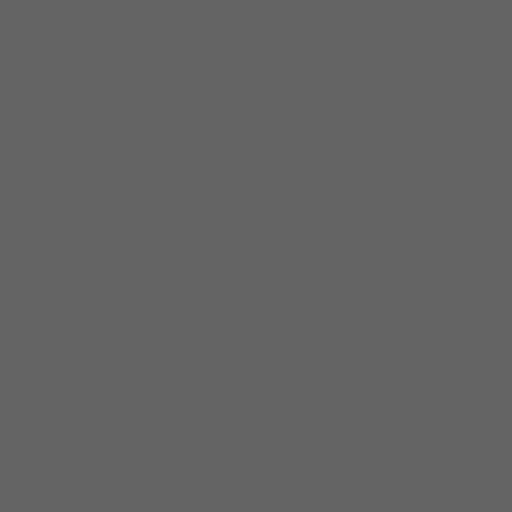
[frame 1/370  lung]
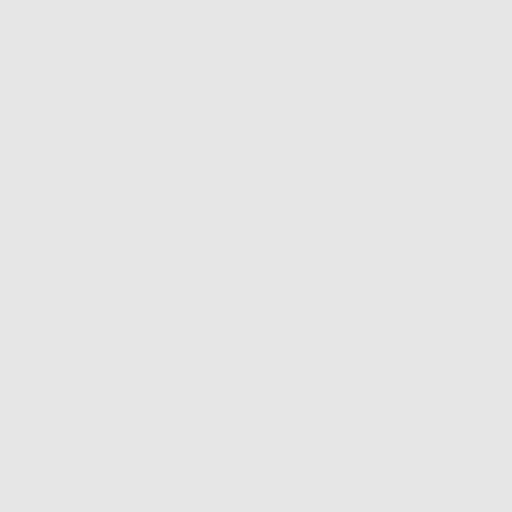
[frame 42/370  lung]
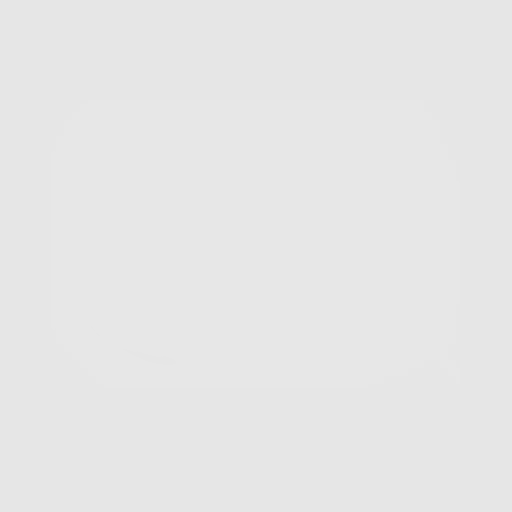
[frame 83/370  lung]
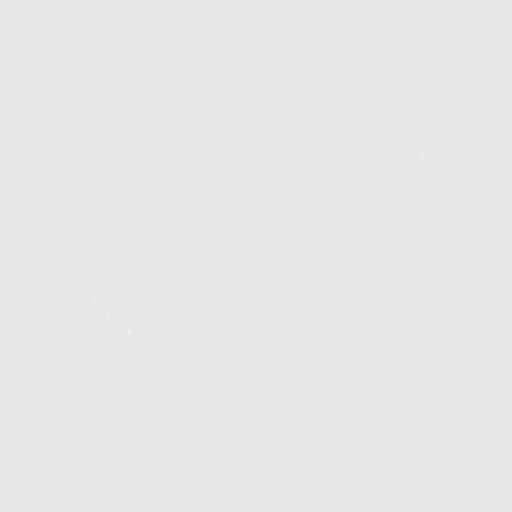
[frame 124/370  lung]
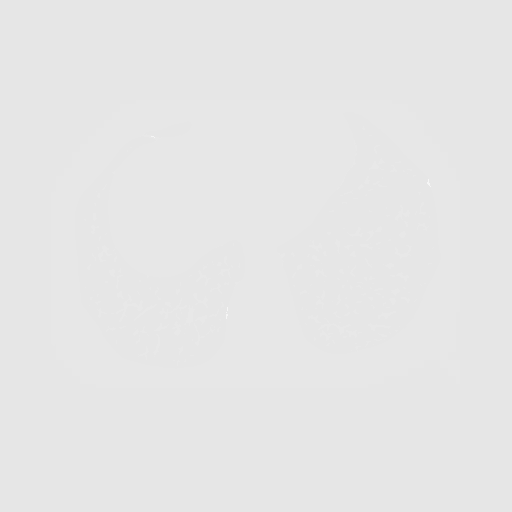
[frame 165/370  mediastinal]
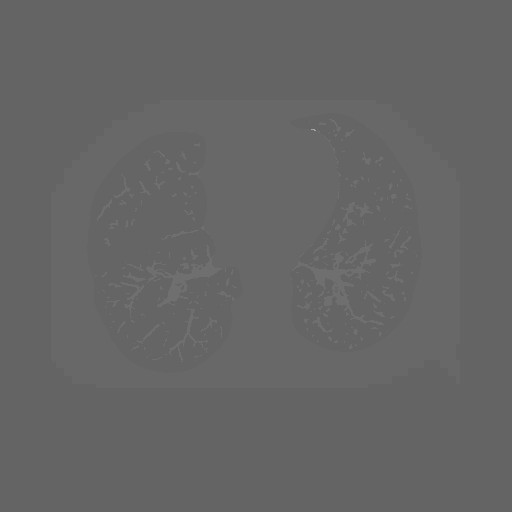
[frame 165/370  lung]
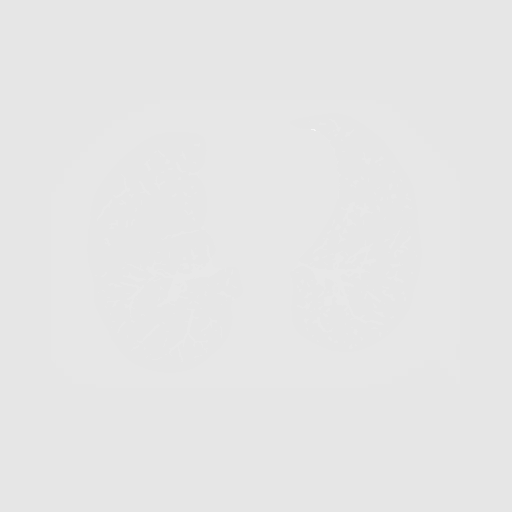
[frame 206/370  lung]
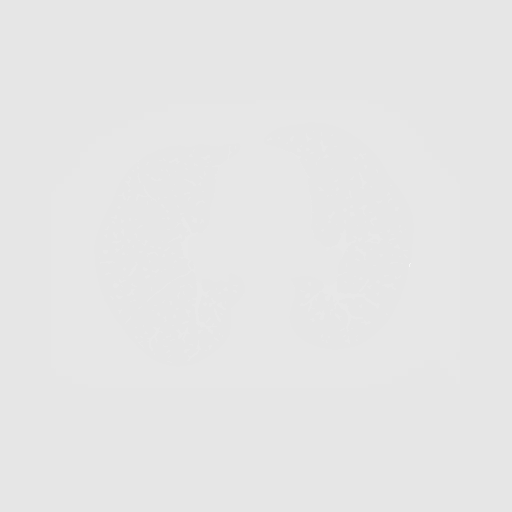
[frame 247/370  lung]
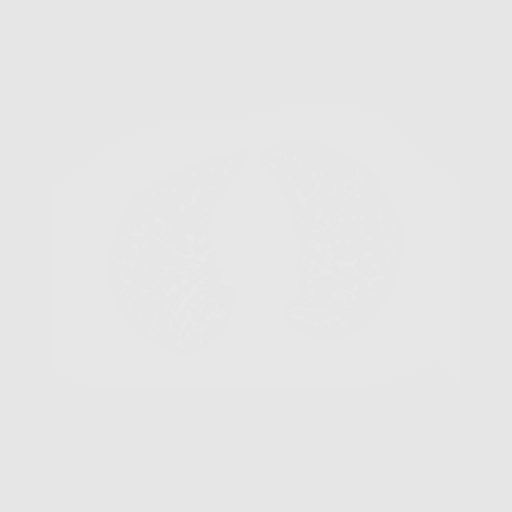
[frame 288/370  lung]
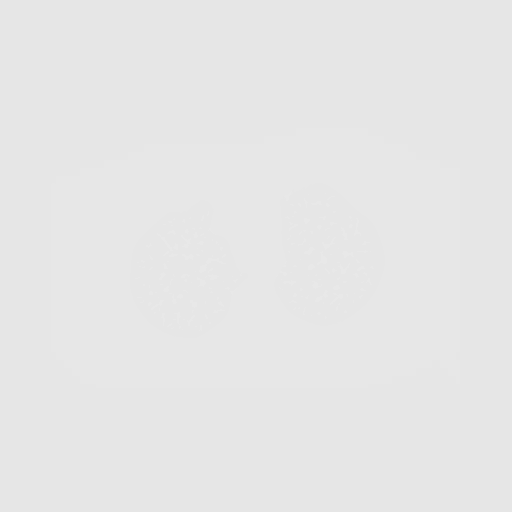
[frame 329/370  mediastinal]
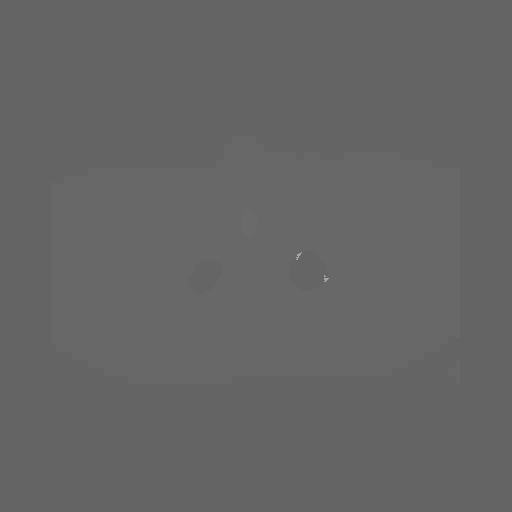
[frame 329/370  lung]
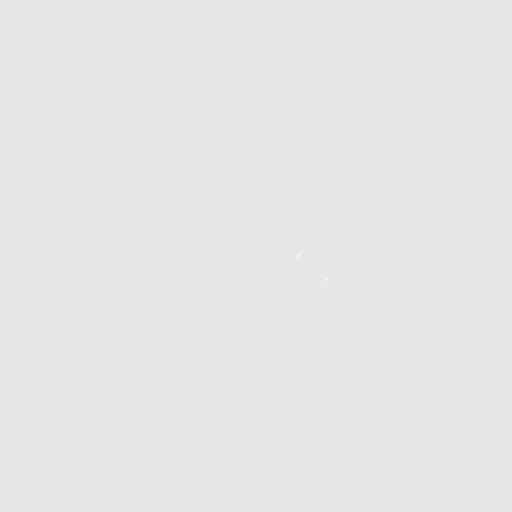
[frame 370/370  lung]
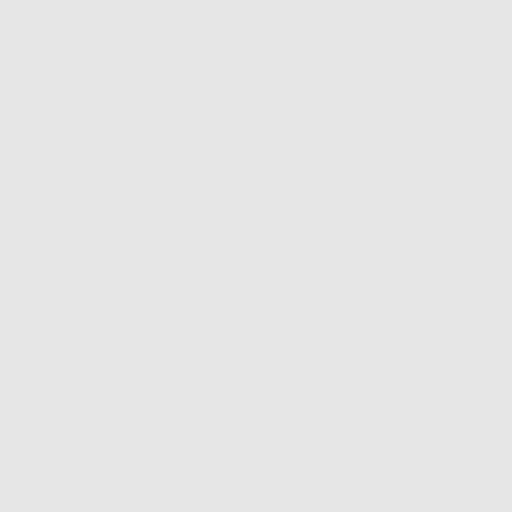

[10 of 20 positions shown; findings below may reference images not displayed]

FINDINGS: Cardiovascular: Heart size is normal. There is no significant
pericardial fluid, thickening or pericardial calcification. There is
aortic atherosclerosis, as well as atherosclerosis of the great
vessels of the mediastinum and the coronary arteries, including
calcified atherosclerotic plaque in the left main, left anterior
descending and right coronary arteries. Severe calcifications of the
mitral annulus.

Mediastinum/Nodes: No pathologically enlarged mediastinal or hilar
lymph nodes. Please note that accurate exclusion of hilar adenopathy
is limited on noncontrast CT scans. Esophagus is unremarkable in
appearance. No axillary lymphadenopathy.

Lungs/Pleura: No suspicious appearing pulmonary nodules or masses
are noted. No acute consolidative airspace disease. No pleural
effusions.

Upper Abdomen: 3 mm nonobstructive calculus in the upper pole
collecting system of the left kidney. Aortic atherosclerosis.

Musculoskeletal: Median sternotomy wires. There are no aggressive
appearing lytic or blastic lesions noted in the visualized portions
of the skeleton.
IMPRESSION: 1. Lung-RADS 1S, negative. Continue annual screening with low-dose
chest CT without contrast in 12 months.
2. The "S" modifier above refers to potentially clinically
significant non lung cancer related findings. Specifically, there is
aortic atherosclerosis, in addition to left main and 2 vessel
coronary artery disease. Please note that although the presence of
coronary artery calcium documents the presence of coronary artery
disease, the severity of this disease and any potential stenosis
cannot be assessed on this non-gated CT examination. Assessment for
potential risk factor modification, dietary therapy or pharmacologic
therapy may be warranted, if clinically indicated.
3. There are calcifications of the mitral annulus. Echocardiographic
correlation for evaluation of potential valvular dysfunction may be
warranted if clinically indicated.

Aortic Atherosclerosis ([id number]-Y7Z.Z).

## 2021-09-12 DIAGNOSIS — H2513 Age-related nuclear cataract, bilateral: Secondary | ICD-10-CM | POA: Diagnosis not present

## 2021-09-16 ENCOUNTER — Ambulatory Visit (INDEPENDENT_AMBULATORY_CARE_PROVIDER_SITE_OTHER): Payer: Medicare HMO | Admitting: Nurse Practitioner

## 2021-09-16 ENCOUNTER — Other Ambulatory Visit: Payer: Self-pay

## 2021-09-16 ENCOUNTER — Encounter: Payer: Self-pay | Admitting: Nurse Practitioner

## 2021-09-16 ENCOUNTER — Telehealth: Payer: Self-pay

## 2021-09-16 DIAGNOSIS — Z0000 Encounter for general adult medical examination without abnormal findings: Secondary | ICD-10-CM

## 2021-09-16 NOTE — Progress Notes (Signed)
Subjective:   Richey Doolittle. is a 75 y.o. male who presents for Medicare Annual/Subsequent preventive examination.  Review of Systems     Cardiac Risk Factors include: family history of premature cardiovascular disease;advanced age (>91men, >22 women);hypertension;dyslipidemia     Objective:    There were no vitals filed for this visit. There is no height or weight on file to calculate BMI.  Advanced Directives 09/16/2021 09/13/2020 09/12/2020 04/04/2020 09/12/2019 09/07/2018 05/10/2018  Does Patient Have a Medical Advance Directive? No Yes Yes - Yes Yes No  Type of Advance Directive - [place];Living will [place];Living will Healthcare Power of [place];Living will [place];Living will -  Does patient want to make changes to medical advance directive? - No - Patient declined No - Guardian declined No - Guardian declined No - Patient declined No - Patient declined -  Copy of [place] in Chart? - Yes - validated most recent copy scanned in chart (See row information) Yes - validated most recent copy scanned in chart (See row information) Yes - validated most recent copy scanned in chart (See row information) Yes - validated most recent copy scanned in chart (See row information) Yes - validated most recent copy scanned in chart (See row information) -  Would patient like information on creating a medical advance directive? - - - - - - No - Patient declined    Current Medications (verified) Outpatient Encounter Medications as of 09/16/2021  Medication Sig   aspirin EC 81 MG tablet Take 81 mg by mouth daily.   buPROPion (WELLBUTRIN XL) 300 MG 24 hr tablet Take 1 tablet (300 mg total) by mouth daily.   colchicine 0.6 MG tablet Take 0.6 mg by mouth as needed.   ezetimibe (ZETIA) 10 MG tablet Take 1 tablet (10 mg total) by mouth daily.   hydrochlorothiazide (HYDRODIURIL) 25 MG  tablet TAKE 1 TABLET (25 MG TOTAL) BY MOUTH DAILY AS NEEDED.   isosorbide mononitrate (IMDUR) 30 MG 24 hr tablet TAKE 1/2 OF A TABLET (15 MG TOTAL) BY MOUTH DAILY   losartan (COZAAR) 25 MG tablet Take 1 tablet (25 mg total) by mouth daily.   metoprolol succinate (TOPROL-XL) 100 MG 24 hr tablet TAKE 1 TABLET BY MOUTH EVERY DAY   nitroGLYCERIN (NITROSTAT) 0.4 MG SL tablet Place 1 tablet (0.4 mg total) under the tongue every 5 (five) minutes as needed for chest pain.   propranolol (INDERAL) 20 MG tablet TAKE 1 TABLET (20 MG TOTAL) BY MOUTH AS NEEDED (AS NEEDED FOR FAST HEART RATES).   senna (SENOKOT) 8.6 MG tablet Take 2 tablets by mouth at bedtime.   simvastatin (ZOCOR) 40 MG tablet Take 1 tablet (40 mg total) by mouth daily.   spironolactone (ALDACTONE) 25 MG tablet TAKE 1 TABLET BY MOUTH EVERY DAY   traZODone (DESYREL) 50 MG tablet TAKE 1 TABLET (50 MG TOTAL) BY MOUTH AT BEDTIME AS NEEDED. FOR INSOMNIA   No facility-administered encounter medications on file as of 09/16/2021.    Allergies (verified) Pollen extract   History: Past Medical History:  Diagnosis Date   Actinic keratosis 02/10/2021   left ear mid antihelix   Alcoholism ([place])    per pt   Allergy    Basal cell carcinoma 05/13/2020   left prox medial pretibial    Basal cell carcinoma 08/26/2020   below the left medial knee - tx with ED&C    Basal cell carcinoma  02/10/2021   Right lower eyelid/lat canthus Moh's 08/07/2021   Coronary artery disease    a. 06/2017 MV: EF 45%, large inf and apical defect w/o ischemia;  b. 06/2017 Cath: LM nl, LAD 30ost, LCX 50p, OM1/2 ok, RCA 100p w/ L->R collats. EF 35%.   Depression with anxiety    Gastric ulcer, acute with hemorrhage 2001   Dr. Sharlet Salina, [facility]   H/O congenital atrial septal defect (ASD) repair    Heart murmur    ASD repair    Helicobacter pylori gastritis    Heme positive stool    History of gout    History of MRSA infection    History of peptic ulcer disease    had  bleeding ulcers requiring transfusion   History of PSVT (paroxysmal supraventricular tachycardia)    Hyperlipidemia    Hypertension    Ischemic cardiomyopathy    a. 06/2017 LV gram: EF 35%.   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 Annamaria Boots ([facility]); was PFO repair--has been normal in f/u   PALATE / UVULA BIOPSY / EXCISION  2000   [place])   [place]     Family History  Problem Relation Age of Onset   Failure to thrive Mother    Arthritis Mother    Other Mother        Colectomy   Brain cancer Father    Hypertension Sister    Stomach cancer Maternal Grandmother    Cancer Paternal Grandfather        type unknown   Liver cancer Paternal Uncle    Other Other        mother's siblings died of cancer ( abdominal)   Colon cancer Neg Hx    Colon polyps Neg Hx    Esophageal cancer Neg Hx    Rectal cancer Neg Hx    Social History   Socioeconomic History   Marital status: Married    Spouse name: Not on file   Number of children: 1   Years of education: Not on file   Highest education level: Not on file  Occupational History   Occupation: retired  Tobacco Use   Smoking status: Former    Years: 40.00    Types:  Cigarettes    Quit date: 09/04/2015    Years since quitting: 6.0   Smokeless tobacco: Never  Vaping Use   Vaping Use: Never used  Substance and Sexual Activity   Alcohol use: No    Alcohol/week: 0.0 standard drinks    Comment: previously did drink alcohol heavily   Drug  use: No    Comment: likes to avoid pain meds, no illicit drugs used   Sexual activity: Yes  Other Topics Concern   Not on file  Social History Narrative   Drinks caffeine   Married in 1973   Lives in house   Two story home, 2 live in home   [place] and does yardwork   Has living will and [employer] [facility]   Social Determinants of Health   Financial Resource Strain: Not on file  Food Insecurity: Not on file  Transportation Needs: Not on file  Physical Activity: Not on file  Stress: Not on file  Social Connections: Not on file    Tobacco Counseling Counseling given: Not Answered   Clinical Intake:  Pre-visit preparation completed: Yes  Pain : No/denies pain     BMI - recorded: 28 Nutritional Status: BMI 25 -29 Overweight Nutritional Risks: None Diabetes: No  How often do you need to have someone help you when you read instructions, pamphlets, or other written materials from your doctor or pharmacy?: 1 - Never  Diabetic?no         Activities of Daily Living In your present state of health, do you have any difficulty performing the following activities: 09/16/2021  Hearing? N  Vision? N  Difficulty concentrating or making decisions? N  Walking or climbing stairs? N  Dressing or bathing? N  Doing errands, shopping? N  Preparing Food and eating ? N  Using the Toilet? N  In the past six months, have you accidently leaked urine? N  Do you have problems with loss of bowel control? N  Managing your Medications? N  Managing your Finances? N  Housekeeping or managing your Housekeeping? N  Some recent data might be hidden    Patient Care Team: [name], NP as PCP - General  (Geriatric Medicine) [place], Peter M, MD as PCP - Cardiology (Cardiology) [name], [place] as Referring Physician (Dermatology) [name], MD as Referring Physician (Otolaryngology)  Indicate any recent Medical Services you may have received from other than [facility] providers in the past year (date may be approximate).     Assessment:   This is a routine wellness examination for Zay[place]an.  Hearing/Vision screen Vision Screening - Comments:: Patient has no vision problems. Eye exam this week. Patient sees [facility] Edison [facility]  Dietary issues and exercise activities discussed: Current Exercise Habits: The patient does not participate in regular exercise at present   Goals Addressed   None   Depression Screen PHQ 2/9 Scores 09/16/2021 09/13/2020 09/12/2020 04/04/2020 09/25/2019 09/12/2019 06/01/2019  PHQ - 2 Score 0 0 0 0 0 0 0  PHQ- 9 Score - - - - - - -    Fall Risk Fall Risk  09/16/2021 09/13/2020 09/12/2020 04/04/2020 09/25/2019  Falls in the past year? 0 0 0 0 0  Number falls in past yr: 0 0 0 0 0  Injury with Fall? 0 0 0 0 0  Comment - - - - -  Risk for fall due to : No Fall Risks - - - -  Follow up Falls evaluation completed - - - -    FALL RISK PREVENTION PERTAINING TO THE HOME:  Any stairs in or around the home? Yes  If so, are there any without handrails? No  Home free of loose throw rugs in walkways, pet beds, electrical cords, etc? Yes  Adequate lighting in your home to reduce risk of falls? Yes  ASSISTIVE DEVICES UTILIZED TO PREVENT FALLS:  Life alert? No  Use of a cane, walker or w/c? No  Grab bars in the bathroom? No  Shower chair or bench in shower? No  Elevated toilet seat or a handicapped toilet? No   TIMED UP AND GO:  Was the test performed? No .    Cognitive Function: MMSE - Mini Mental State Exam 09/07/2018 08/12/2017 08/07/2016 05/09/2015  Orientation to time 5 4 5 4   Orientation to Place 5 5 5 5   Registration 3 3 3 3   Attention/  Calculation 5 5 5 5   Recall 3 3 3 1   Language- name 2 objects 2 2 2 2   Language- repeat 1 1 1 1   Language- follow 3 step command 3 3 3 3   Language- read & follow direction 1 1 1 1   Write a sentence 1 1 1 1   Copy design 1 1 1 1   Total score 30 29 30 27      6CIT Screen 09/16/2021 09/13/2020 09/12/2019  What Year? 0 points 0 points 0 points  What month? 0 points 0 points 0 points  What time? 0 points 0 points 0 points  Count back from 20 0 points 0 points 0 points  Months in reverse 0 points 0 points 0 points  Repeat phrase 0 points 0 points 0 points  Total Score 0 0 0    Immunizations Immunization History  Administered Date(s) Administered   Fluad Quad(high Dose 65+) 06/01/2019, 06/07/2020, 07/25/2021   Influenza, High Dose Seasonal PF 06/25/2017, 07/25/2018   Influenza,inj,Quad PF,6+ Mos 08/27/2015, 07/10/2016   Moderna Covid-19 Vaccine Bivalent Booster 69yrs & up 06/10/2021   Moderna Sars-Covid-2 Vaccination 10/27/2019, 11/24/2019, 07/10/2020, 01/03/2021   Pneumococcal Conjugate-13 10/05/2013   Pneumococcal Polysaccharide-23 08/07/2016   Zoster Recombinat (Shingrix) 07/06/2017, 12/08/2017    Declines tdap  Flu Vaccine status: Up to date  Pneumococcal vaccine status: Up to date  Covid-19 vaccine status: Completed vaccines  Qualifies for Shingles Vaccine? Yes   Zostavax completed No   Shingrix Completed?: Yes  Screening Tests Health Maintenance  Topic Date Due   TETANUS/TDAP  09/11/2024 (Originally 07/31/1965)   COLONOSCOPY (Pts 45-35yrs Insurance coverage will need to be confirmed)  01/23/2024   Pneumonia Vaccine 30+ Years old  Completed   INFLUENZA VACCINE  Completed   COVID-19 Vaccine  Completed   Hepatitis C Screening  Completed   Zoster Vaccines- Shingrix  Completed   HPV VACCINES  Aged Out    Health Maintenance  There are no preventive care reminders to display for this patient.  Colorectal cancer screening: No longer required.   Lung Cancer  Screening: (Low Dose CT Chest recommended if Age 64-80 years, 30 pack-year currently smoking OR have quit w/in 15years.) does not qualify.   Lung Cancer Screening Referral: na  Additional Screening:  Hepatitis C Screening: does qualify; Completed   Vision Screening: Recommended annual ophthalmology exams for early detection of glaucoma and other disorders of the eye. Is the patient up to date with their annual eye exam?  Yes  Who is the provider or what is the name of the office in which the patient attends annual eye exams? [facility] If pt is not established with a provider, would they like to be referred to a provider to establish care? No .   Dental Screening: Recommended annual dental exams for proper oral hygiene  Community Resource Referral / Chronic Care Management: CRR required this visit?  No   CCM required this visit?  No      Plan:     I have personally reviewed and noted the following in the patients chart:   Medical and social history Use of alcohol, tobacco or illicit drugs  Current medications and supplements including opioid prescriptions. Patient is not currently taking opioid prescriptions. Functional ability and status Nutritional status Physical activity Advanced directives List of other physicians Hospitalizations, surgeries, and ER visits in previous 12 months Vitals Screenings to include cognitive, depression, and falls Referrals and appointments  In addition, I have reviewed and discussed with patient certain preventive protocols, quality metrics, and best practice recommendations. A written personalized care plan for preventive services as well as general preventive health recommendations were provided to patient.     [name], NP   09/16/2021    Virtual Visit via Telephone Note  I connected withNAME@ on 09/16/21 at  3:15 PM EST by telephone and verified that I am speaking with the correct person using two  identifiers.  Location: Patient: home Provider: [place]   I discussed the limitations, risks, security and privacy concerns of performing an evaluation and management service by telephone and the availability of in person appointments. I also discussed with the patient that there may be a patient responsible charge related to this service. The patient expressed understanding and agreed to proceed.   I discussed the assessment and treatment plan with the patient. The patient was provided an opportunity to ask questions and all were answered. The patient agreed with the plan and demonstrated an understanding of the instructions.   The patient was advised to call back or seek an in-person evaluation if the symptoms worsen or if the condition fails to improve as anticipated.  I provided 15 minutes of non-face-to-face time during this encounter.  Carlos American. Harle Battiest Avs printed and mailed

## 2021-09-16 NOTE — Telephone Encounter (Signed)
Mr. husam, hohn are scheduled for a virtual visit with your provider today.    Just as we do with appointments in the office, we must obtain your consent to participate.  Your consent will be active for this visit and any virtual visit you may have with one of our providers in the next 365 days.    If you have a MyChart account, I can also send a copy of this consent to you electronically.  All virtual visits are billed to your insurance company just like a traditional visit in the office.  As this is a virtual visit, video technology does not allow for your provider to perform a traditional examination.  This may limit your provider's ability to fully assess your condition.  If your provider identifies any concerns that need to be evaluated in person or the need to arrange testing such as labs, EKG, etc, we will make arrangements to do so.    Although advances in technology are sophisticated, we cannot ensure that it will always work on either your end or our end.  If the connection with a video visit is poor, we may have to switch to a telephone visit.  With either a video or telephone visit, we are not always able to ensure that we have a secure connection.   I need to obtain your verbal consent now.   Are you willing to proceed with your visit today?   Sheral Apley Brod Jr. has provided verbal consent on 09/16/2021 for a virtual visit (video or telephone).   Carroll Kinds, [facility] 09/16/2021  3:23 PM

## 2021-09-16 NOTE — Progress Notes (Signed)
This service is provided via telemedicine  No vital signs collected/recorded due to the encounter was a telemedicine visit.   Location of patient (ex: home, work):  Home  Patient consents to a telephone visit:  Yes, see encounter dated 09/16/2021  Location of the provider (ex: office, home):  [place]  Name of any referring provider:  N/A  Names of all persons participating in the telemedicine service and their role in the encounter:  Sherrie Mustache, Nurse Practitioner, Carroll Kinds, CMA, and patient.   Time spent on call:  15 minutes with medical assisstant

## 2021-09-16 NOTE — Patient Instructions (Signed)
Mitchell Stephens , Thank you for taking time to come for your Medicare Wellness Visit. I appreciate your ongoing commitment to your health goals. Please review the following plan we discussed and let me know if I can assist you in the future.   Screening recommendations/referrals: Colonoscopy up to date Recommended yearly ophthalmology/optometry visit for glaucoma screening and checkup Recommended yearly dental visit for hygiene and checkup  Vaccinations: Influenza vaccine up to date Pneumococcal vaccine up to date Tdap vaccine - recommended Shingles vaccine up to date    Advanced directives: on file.   Conditions/risks identified: advanced age, male, cardiac risk factors.   Next appointment: yearly   Preventive Care 3 Years and Older, Male Preventive care refers to lifestyle choices and visits with your health care provider that can promote health and wellness. What does preventive care include? A yearly physical exam. This is also called an annual well check. Dental exams once or twice a year. Routine eye exams. Ask your health care provider how often you should have your eyes checked. Personal lifestyle choices, including: Daily care of your teeth and gums. Regular physical activity. Eating a healthy diet. Avoiding tobacco and drug use. Limiting alcohol use. Practicing safe sex. Taking low doses of aspirin every day. Taking vitamin and mineral supplements as recommended by your health care provider. What happens during an annual well check? The services and screenings done by your health care provider during your annual well check will depend on your age, overall health, lifestyle risk factors, and family history of disease. Counseling  Your health care provider may ask you questions about your: Alcohol use. Tobacco use. Drug use. Emotional well-being. Home and relationship well-being. Sexual activity. Eating habits. History of falls. Memory and ability to understand  (cognition). Work and work Statistician. Screening  You may have the following tests or measurements: Height, weight, and BMI. Blood pressure. Lipid and cholesterol levels. These may be checked every 5 years, or more frequently if you are over 42 years old. Skin check. Lung cancer screening. You may have this screening every year starting at age 57 if you have a 30-pack-year history of smoking and currently smoke or have quit within the past 15 years. Fecal occult blood test (FOBT) of the stool. You may have this test every year starting at age 93. Flexible sigmoidoscopy or colonoscopy. You may have a sigmoidoscopy every 5 years or a colonoscopy every 10 years starting at age 78. Prostate cancer screening. Recommendations will vary depending on your family history and other risks. Hepatitis C blood test. Hepatitis B blood test. Sexually transmitted disease (STD) testing. Diabetes screening. This is done by checking your blood sugar (glucose) after you have not eaten for a while (fasting). You may have this done every 1-3 years. Abdominal aortic aneurysm (AAA) screening. You may need this if you are a current or former smoker. Osteoporosis. You may be screened starting at age 66 if you are at high risk. Talk with your health care provider about your test results, treatment options, and if necessary, the need for more tests. Vaccines  Your health care provider may recommend certain vaccines, such as: Influenza vaccine. This is recommended every year. Tetanus, diphtheria, and acellular pertussis (Tdap, Td) vaccine. You may need a Td booster every 10 years. Zoster vaccine. You may need this after age 27. Pneumococcal 13-valent conjugate (PCV13) vaccine. One dose is recommended after age 57. Pneumococcal polysaccharide (PPSV23) vaccine. One dose is recommended after age 37. Talk to your health care provider  about which screenings and vaccines you need and how often you need them. This  information is not intended to replace advice given to you by your health care provider. Make sure you discuss any questions you have with your health care provider. Document Released: 10/18/2015 Document Revised: 06/10/2016 Document Reviewed: 07/23/2015 Elsevier Interactive Patient Education  2017 [employer] Prevention in the Home Falls can cause injuries. They can happen to people of all ages. There are many things you can do to make your home safe and to help prevent falls. What can I do on the outside of my home? Regularly fix the edges of walkways and driveways and fix any cracks. Remove anything that might make you trip as you walk through a door, such as a raised step or threshold. Trim any bushes or trees on the path to your home. Use bright outdoor lighting. Clear any walking paths of anything that might make someone trip, such as rocks or tools. Regularly check to see if handrails are loose or broken. Make sure that both sides of any steps have handrails. Any raised decks and porches should have guardrails on the edges. Have any leaves, snow, or ice cleared regularly. Use sand or salt on walking paths during winter. Clean up any spills in your garage right away. This includes oil or grease spills. What can I do in the bathroom? Use night lights. Install grab bars by the toilet and in the tub and shower. Do not use towel bars as grab bars. Use non-skid mats or decals in the tub or shower. If you need to sit down in the shower, use a plastic, non-slip stool. Keep the floor dry. Clean up any water that spills on the floor as soon as it happens. Remove soap buildup in the tub or shower regularly. Attach bath mats securely with double-sided non-slip rug tape. Do not have throw rugs and other things on the floor that can make you trip. What can I do in the bedroom? Use night lights. Make sure that you have a light by your bed that is easy to reach. Do not use any sheets or  blankets that are too big for your bed. They should not hang down onto the floor. Have a firm chair that has side arms. You can use this for support while you get dressed. Do not have throw rugs and other things on the floor that can make you trip. What can I do in the kitchen? Clean up any spills right away. Avoid walking on wet floors. Keep items that you use a lot in easy-to-reach places. If you need to reach something above you, use a strong step stool that has a grab bar. Keep electrical cords out of the way. Do not use floor polish or wax that makes floors slippery. If you must use wax, use non-skid floor wax. Do not have throw rugs and other things on the floor that can make you trip. What can I do with my stairs? Do not leave any items on the stairs. Make sure that there are handrails on both sides of the stairs and use them. Fix handrails that are broken or loose. Make sure that handrails are as long as the stairways. Check any carpeting to make sure that it is firmly attached to the stairs. Fix any carpet that is loose or worn. Avoid having throw rugs at the top or bottom of the stairs. If you do have throw rugs, attach them to the floor  with carpet tape. Make sure that you have a light switch at the top of the stairs and the bottom of the stairs. If you do not have them, ask someone to add them for you. What else can I do to help prevent falls? Wear shoes that: Do not have high heels. Have rubber bottoms. Are comfortable and fit you well. Are closed at the toe. Do not wear sandals. If you use a stepladder: Make sure that it is fully opened. Do not climb a closed stepladder. Make sure that both sides of the stepladder are locked into place. Ask someone to hold it for you, if possible. Clearly mark and make sure that you can see: Any grab bars or handrails. First and last steps. Where the edge of each step is. Use tools that help you move around (mobility aids) if they are  needed. These include: Canes. Walkers. Scooters. Crutches. Turn on the lights when you go into a dark area. Replace any light bulbs as soon as they burn out. Set up your furniture so you have a clear path. Avoid moving your furniture around. If any of your floors are uneven, fix them. If there are any pets around you, be aware of where they are. Review your medicines with your doctor. Some medicines can make you feel dizzy. This can increase your chance of falling. Ask your doctor what other things that you can do to help prevent falls. This information is not intended to replace advice given to you by your health care provider. Make sure you discuss any questions you have with your health care provider. Document Released: 07/18/2009 Document Revised: 02/27/2016 Document Reviewed: 10/26/2014 Elsevier Interactive Patient Education  2017 [employer].

## 2021-09-24 ENCOUNTER — Ambulatory Visit: Payer: Medicare HMO | Admitting: Dermatology

## 2021-09-24 ENCOUNTER — Other Ambulatory Visit: Payer: Self-pay

## 2021-09-24 ENCOUNTER — Encounter: Payer: Self-pay | Admitting: Dermatology

## 2021-09-24 DIAGNOSIS — D045 Carcinoma in situ of skin of trunk: Secondary | ICD-10-CM

## 2021-09-24 DIAGNOSIS — D099 Carcinoma in situ, unspecified: Secondary | ICD-10-CM

## 2021-09-24 NOTE — Patient Instructions (Addendum)

## 2021-09-24 NOTE — Progress Notes (Signed)
° °  Follow-Up Visit   Subjective  Mitchell Stephens. is a 75 y.o. male who presents for the following: Skin Cancer (Biopsy proven SSCis at the right lateral pectoral 08/13/2021, pt here for treatment).  The following portions of the chart were reviewed this encounter and updated as appropriate:   Tobacco   Allergies   Meds   Problems   Med Hx   Surg Hx   Fam Hx      Review of Systems:  No other skin or systemic complaints except as noted in HPI or Assessment and Plan.  Objective  Well appearing patient in no apparent distress; mood and affect are within normal limits.  A focused examination was performed including chest . Relevant physical exam findings are noted in the Assessment and Plan.  right lateral pectoral Keratotic pink papule/nodule or plaque.    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  Destruction of lesion  Destruction method: electrodesiccation and curettage   Informed consent: discussed and consent obtained   Timeout:  patient name, date of birth, surgical site, and procedure verified Anesthesia: the lesion was anesthetized in a standard fashion   Anesthetic:  1% lidocaine w/ epinephrine 1-100,000 buffered w/ 8.4% NaHCO3 Curettage performed in three different directions: Yes   Electrodesiccation performed over the curetted area: Yes   Curettage cycles:  3 Final wound size (cm):  2.1 Hemostasis achieved with:  electrodesiccation Outcome: patient tolerated procedure well with no complications   Post-procedure details: sterile dressing applied and wound care instructions given   Dressing type: petrolatum     Return for as scheduled Nov 20, 2021.  IMarye Round, CMA, am acting as scribe for Sarina Ser, MD .  Documentation: I have reviewed the above documentation for accuracy and completeness, and I agree with the above.  Sarina Ser, MD

## 2021-10-05 ENCOUNTER — Encounter: Payer: Self-pay | Admitting: Dermatology

## 2021-11-07 ENCOUNTER — Other Ambulatory Visit: Payer: Self-pay

## 2021-11-07 ENCOUNTER — Encounter: Payer: Self-pay | Admitting: Nurse Practitioner

## 2021-11-07 ENCOUNTER — Ambulatory Visit (INDEPENDENT_AMBULATORY_CARE_PROVIDER_SITE_OTHER): Payer: Medicare HMO | Admitting: Nurse Practitioner

## 2021-11-07 VITALS — BP 122/78 | HR 73 | Temp 97.3°F | Ht 73.0 in | Wt 209.0 lb

## 2021-11-07 DIAGNOSIS — F325 Major depressive disorder, single episode, in full remission: Secondary | ICD-10-CM

## 2021-11-07 DIAGNOSIS — R69 Illness, unspecified: Secondary | ICD-10-CM | POA: Diagnosis not present

## 2021-11-07 DIAGNOSIS — I255 Ischemic cardiomyopathy: Secondary | ICD-10-CM

## 2021-11-07 DIAGNOSIS — Z9989 Dependence on other enabling machines and devices: Secondary | ICD-10-CM

## 2021-11-07 DIAGNOSIS — R739 Hyperglycemia, unspecified: Secondary | ICD-10-CM

## 2021-11-07 DIAGNOSIS — K5904 Chronic idiopathic constipation: Secondary | ICD-10-CM

## 2021-11-07 DIAGNOSIS — E782 Mixed hyperlipidemia: Secondary | ICD-10-CM

## 2021-11-07 DIAGNOSIS — Z87891 Personal history of nicotine dependence: Secondary | ICD-10-CM | POA: Diagnosis not present

## 2021-11-07 DIAGNOSIS — F339 Major depressive disorder, recurrent, unspecified: Secondary | ICD-10-CM

## 2021-11-07 DIAGNOSIS — F5101 Primary insomnia: Secondary | ICD-10-CM

## 2021-11-07 DIAGNOSIS — G4733 Obstructive sleep apnea (adult) (pediatric): Secondary | ICD-10-CM | POA: Diagnosis not present

## 2021-11-07 DIAGNOSIS — I25118 Atherosclerotic heart disease of native coronary artery with other forms of angina pectoris: Secondary | ICD-10-CM | POA: Diagnosis not present

## 2021-11-07 DIAGNOSIS — I10 Essential (primary) hypertension: Secondary | ICD-10-CM

## 2021-11-07 MED ORDER — SERTRALINE HCL 50 MG PO TABS: 50.0000 mg | ORAL_TABLET | Freq: Every day | ORAL | 3 refills | Status: DC

## 2021-11-07 NOTE — Progress Notes (Signed)
Careteam: Patient Care Team: [name], NP as PCP - General (Geriatric Medicine) [place], Peter M, MD as PCP - Cardiology (Cardiology) [name], [place] as Referring Physician (Dermatology) [name], MD as Referring Physician (Otolaryngology)  PLACE OF SERVICE:  [place] Directive information Does Patient Have a Medical Advance Directive?: Yes, Type of Advance Directive: [place];Living will, Does patient want to make changes to medical advance directive?: No - Patient declined  Allergies  Allergen Reactions   Pollen Extract Other (See Comments)    Stuffy nose/itchy throat (Seasonal allergies)    Chief Complaint  Patient presents with   Medical Management of Chronic Issues    Routine visit      HPI: Patient is a 76 y.o. male for follow up visit.   States he does not get adequate sleep. Started first week of January. wakes up every hour and half . Goes to bed around 11 to 12 at night. Falls asleep around 4 am. Takes trazodone 50 mg HS.  States he is feeling fatigue, lacks energy to do any activities.   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   Had a surgery to remove a basal cell carcinoma of right lower eye lids recently.   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 Walking and pain has slowly improved.   Recent stress test showed that heart disease is worsening, he is followed with cardiology in regards to this.   Pt is a former smoker. With 40 pack year history. Quit is 09/04/2015. Would like to reschedule a low dose CT scan.   Diet: low in fiber, takes senna s daily. Does consume 6 glasses per day. Eats sweet potatoes, salmon a lot, chicken, not much red meat.   Exercise: Usually physically active but unable to recently due to hip/pelvic  pain.   Does not monitor Bp at home but states he takes his bp medications consistently  Does not consume any alcohol   Up to date with eye exam and vaccines  Review of Systems:  Review of Systems  Constitutional:  Negative for chills, fever, malaise/fatigue and weight loss.  Respiratory:  Negative for cough, shortness of breath and wheezing.   Cardiovascular:  Negative for chest pain and palpitations.  Gastrointestinal:  Positive for constipation. Negative for abdominal pain, diarrhea, heartburn, nausea and vomiting.       Describes BM as hard stool   Genitourinary:  Negative for dysuria, frequency and urgency.  Musculoskeletal:  Positive for joint pain and myalgias.  Neurological:  Negative for dizziness and headaches.  Psychiatric/Behavioral:  Positive for depression. Negative for memory loss. The patient is nervous/anxious and has insomnia.    Past Medical History:  Diagnosis Date   Actinic keratosis 02/10/2021   left ear mid antihelix   Alcoholism ([place])    per pt   Allergy    Basal cell carcinoma 05/13/2020   left prox medial pretibial    Basal cell carcinoma 08/26/2020   below the left medial knee - tx with ED&C    Basal cell carcinoma 02/10/2021   Right lower eyelid/lat canthus Moh's 08/07/2021   Coronary artery disease    a. 06/2017 MV: EF 45%, large inf and apical defect w/o ischemia;  b. 06/2017 Cath: LM nl, LAD  30ost, LCX 50p, OM1/2 ok, RCA 100p w/ L->R collats. EF 35%.   Depression with anxiety    Gastric ulcer, acute with hemorrhage 2001   Dr. Sharlet [place], [facility]   H/O congenital atrial septal defect (ASD) repair    Heart murmur    ASD repair    Helicobacter pylori gastritis    Heme positive stool    History of gout    History of MRSA infection    History of peptic ulcer disease    had bleeding ulcers requiring transfusion   History of PSVT (paroxysmal supraventricular tachycardia)    Hyperlipidemia    Hypertension    Ischemic cardiomyopathy    a. 06/2017 LV  gram: EF 35%.   Myocardial infarction ([place]) 2018   Obstructive sleep apnea    on CPAP   Perennial allergic rhinitis    Sleep apnea    wears cpap    Squamous cell carcinoma of skin 01/29/2020   right clavicle   Squamous cell carcinoma of skin 08/13/2021   R lat pectoral - ED&C   Tobacco abuse    Past Surgical History:  Procedure Laterality Date   ASD REPAIR, OSTIUM PRIMUM  1966   basal cell carcinoma removal     COLONOSCOPY     EAR BIOPSY Left 2013   Dr.Cook--squamous cell ca ([facility])   LEFT HEART CATH AND CORONARY ANGIOGRAPHY Left 06/30/2017   Procedure: LEFT HEART CATH AND CORONARY ANGIOGRAPHY;  Surgeon: Minna Merritts, MD;  Location: [facility];  Service: Cardiovascular;  Laterality: Left;   MOHS SURGERY     ear   open heart surgery  1966   Dr. Annamaria Boots ([facility]); was PFO repair--has been normal in f/u   [facility] / [place] / [facility])   [place]     Social History:   reports that he quit smoking about 6 years ago. His smoking use included cigarettes. He has never used smokeless tobacco. He reports that he does not drink alcohol and does not use drugs.  Family History  Problem Relation Age of Onset   Failure to thrive Mother    Arthritis Mother    Other Mother        Colectomy   Brain cancer Father    Hypertension Sister    Brain cancer Sister    Stomach cancer Maternal Grandmother    Cancer Paternal Grandfather        type unknown   Liver cancer Paternal Uncle    Other Other        mother's siblings died of cancer ( abdominal)   Colon cancer Neg Hx    Colon polyps Neg Hx    Esophageal cancer Neg Hx    Rectal cancer Neg Hx     Medications: Patient's Medications  New Prescriptions   SERTRALINE (ZOLOFT) 50 MG TABLET    Take 1 tablet (50 mg total) by mouth daily.  Previous Medications   ASPIRIN EC 81 MG TABLET    Take 81 mg by mouth daily.   BUPROPION (WELLBUTRIN XL) 300 MG 24 HR  TABLET    Take 1 tablet (300 mg total) by mouth daily.   COLCHICINE 0.6 MG TABLET    Take 0.6 mg by mouth as needed.   EZETIMIBE (ZETIA) 10 MG TABLET    Take 1 tablet (10 mg total) by mouth daily.   HYDROCHLOROTHIAZIDE (HYDRODIURIL) 25 MG TABLET    TAKE 1 TABLET (  25 MG TOTAL) BY MOUTH DAILY AS NEEDED.   ISOSORBIDE MONONITRATE (IMDUR) 30 MG 24 HR TABLET    TAKE 1/2 OF A TABLET (15 MG TOTAL) BY MOUTH DAILY   LOSARTAN (COZAAR) 25 MG TABLET    Take 1 tablet (25 mg total) by mouth daily.   METOPROLOL SUCCINATE (TOPROL-XL) 100 MG 24 HR TABLET    TAKE 1 TABLET BY MOUTH EVERY DAY   NITROGLYCERIN (NITROSTAT) 0.4 MG SL TABLET    Place 1 tablet (0.4 mg total) under the tongue every 5 (five) minutes as needed for chest pain.   PROPRANOLOL (INDERAL) 20 MG TABLET    TAKE 1 TABLET (20 MG TOTAL) BY MOUTH AS NEEDED (AS NEEDED FOR FAST HEART RATES).   SENNA (SENOKOT) 8.6 MG TABLET    Take 2 tablets by mouth at bedtime.   SIMVASTATIN (ZOCOR) 40 MG TABLET    Take 1 tablet (40 mg total) by mouth daily.   SPIRONOLACTONE (ALDACTONE) 25 MG TABLET    TAKE 1 TABLET BY MOUTH EVERY DAY   TRAZODONE (DESYREL) 50 MG TABLET    TAKE 1 TABLET (50 MG TOTAL) BY MOUTH AT BEDTIME AS NEEDED. FOR INSOMNIA  Modified Medications   No medications on file  Discontinued Medications   No medications on file    Physical Exam:  Vitals:   11/07/21 0835  BP: 122/78  Pulse: 73  Temp: (!) 97.3 F (36.3 C)  TempSrc: Temporal  SpO2: 97%  Weight: 209 lb (94.8 kg)  Height: 6\' 1"  (1.854 m)   Body mass index is 27.57 kg/m. Wt Readings from Last 3 Encounters:  11/07/21 209 lb (94.8 kg)  05/09/21 211 lb 6 oz (95.9 kg)  03/14/21 212 lb 3.2 oz (96.3 kg)    Physical Exam Constitutional:      Appearance: Normal appearance. He is normal weight.  HENT:     Right Ear: Tympanic membrane and external ear normal.     Left Ear: Tympanic membrane and external ear normal.     Ears:     Comments: Wax and dry skin build up in both ears   Cardiovascular:     Rate and Rhythm: Normal rate and regular rhythm.     Pulses: Normal pulses.     Heart sounds: Normal heart sounds.     Comments: Heart sound is distant  Pulmonary:     Effort: Pulmonary effort is normal. No respiratory distress.     Breath sounds: Normal breath sounds. No wheezing.  Abdominal:     General: Abdomen is flat. Bowel sounds are normal.  Musculoskeletal:        General: No signs of injury. Normal range of motion.     Comments: -Strained left groin area - Full range of motion for extension, flexion, and rotation of left hip. Mild pain    Skin:    General: Skin is warm.  Neurological:     Mental Status: He is alert.  Psychiatric:        Judgment: Judgment normal.     Comments: Patient appears sad, and displays a flat affect     Labs reviewed: Basic Metabolic Panel: Recent Labs    03/14/21 0000  NA 137  K 4.8  CL 104  CO2 26  GLUCOSE 97  BUN 25  CREATININE 1.51*  CALCIUM 9.6   Liver Function Tests: Recent Labs    03/14/21 0000  AST 18  ALT 21  BILITOT 0.5  PROT 6.2   No results for input(s):  LIPASE, AMYLASE in the last 8760 hours. No results for input(s): AMMONIA in the last 8760 hours. CBC: Recent Labs    03/14/21 0000  WBC 7.4  NEUTROABS 4,869  HGB 13.6  HCT 40.7  MCV 93.8  PLT 300   Lipid Panel: Recent Labs    03/14/21 0000  CHOL 124  HDL 45  LDLCALC 56  TRIG 150*  CHOLHDL 2.8   TSH: No results for input(s): TSH in the last 8760 hours. A1C: Lab Results  Component Value Date   HGBA1C 5.9 (H) 03/14/2021     Assessment/Plan  1. Atherosclerosis of native coronary artery of native heart with stable angina pectoris (HCC) - stable on current regimen,Aspirin 81 mg QD, Imdur 15 mg QD , nitro SL 0.4 mg PRN  2. Essential hypertension, benign -Patient is on metoprolol 100 mg QD, HCTZ 25mg  QD, Aldactone 25 QD - BP in office today 122/78 -Labs ordered : CBC and CMP -Bp is stable. Will continue to monitor.   3.  Obstructive sleep apnea on CPAP - stable, continues using CPAP --Will continue to monitor  4. Depression, major, in remission ([place])  -PHQ 9 score of 12. Takes trazodone 50mg . Medication is not effective as it usually is. Takes Wellbutrin 300 mg QD.  -New medication sertraline 50 mg QD. #30 tabs. 3 refills.  -to start with 25 mg daily for 1 week then increase to 50 mg tablet. He has been on this in the past with good results.   5. Mixed hyperlipidemia -Controlled with simvastatin 40 mg tablet QD  -Due for lipid panel, ordered this office visit -Will continue to monitor  6. Hyperglycemia -Due for A1C, ordered this office visit -Continue dietary modification   7. Ischemic cardiomyopathy -stable at this time, no worsening shortness of breath or chest pains.  Has a cardiology appointment 11/09/2021  8. Former smoker - CT CHEST LUNG CA SCREEN LOW DOSE W/O CM; Future  9. Chronic idiopathic constipation -okay to add miralax 17 gm 1-2 times daily as needed for constipation -increase fiber and water in diet as well.   10. Primary insomnia -Advised to try sleep training by writing down all worries on a paper before going to bed.  -Practice relaxation techniques before going to bed such as deep breathing, listening to meditation.  -will treat depression and hopefull that will improve insomnia  Next appt: Return in about 6 months (around 05/07/2022) for routine follw up .  Carlos American. Harle Battiest  I personally was present during the history, physical exam and medical decision-making activities of this service and have verified that the service and findings are accurately documented in the students note   [facility] [phone]

## 2021-11-07 NOTE — Patient Instructions (Addendum)
6 weeks for follow up on mood- can be virtual/mychart or in person    To start zoloft 50 mg 1/2 tablet daily for 1 week then increase to 1 tablet   Relaxation methods for sleep.    Miralax 17 gm 1-2 times daily to help with constipation   [facility] [address], [place] [address] Phone: [place] [address] [place], [place] [address] Phone [phone]  Asencion Noble, [place], [place], [place] [address], [place] [address] [place],  [id number] [phone] In home visit available with prior arrangement

## 2021-11-08 LAB — COMPLETE METABOLIC PANEL WITH GFR
AG Ratio: 1.8 (calc) (ref 1.0–2.5)
ALT: 15 U/L (ref 9–46)
AST: 19 U/L (ref 10–35)
Albumin: 4.1 g/dL (ref 3.6–5.1)
Alkaline phosphatase (APISO): 43 U/L (ref 35–144)
BUN/Creatinine Ratio: 15 (calc) (ref 6–22)
BUN: 24 mg/dL (ref 7–25)
CO2: 26 mmol/L (ref 20–32)
Calcium: 9.8 mg/dL (ref 8.6–10.3)
Chloride: 101 mmol/L (ref 98–110)
Creat: 1.64 mg/dL — ABNORMAL HIGH (ref 0.70–1.28)
Globulin: 2.3 g/dL (calc) (ref 1.9–3.7)
Glucose, Bld: 104 mg/dL — ABNORMAL HIGH (ref 65–99)
Potassium: 4.5 mmol/L (ref 3.5–5.3)
Sodium: 135 mmol/L (ref 135–146)
Total Bilirubin: 0.7 mg/dL (ref 0.2–1.2)
Total Protein: 6.4 g/dL (ref 6.1–8.1)
eGFR: 43 mL/min/{1.73_m2} — ABNORMAL LOW (ref >=60)

## 2021-11-08 LAB — LIPID PANEL
Cholesterol: 122 mg/dL (ref <200)
HDL: 47 mg/dL (ref >=40)
LDL Cholesterol (Calc): 52 mg/dL (calc)
Non-HDL Cholesterol (Calc): 75 mg/dL (calc) (ref <130)
Total CHOL/HDL Ratio: 2.6 (calc) (ref <5.0)
Triglycerides: 150 mg/dL — ABNORMAL HIGH (ref <150)

## 2021-11-08 LAB — CBC WITH DIFFERENTIAL/PLATELET
Absolute Monocytes: 757 cells/uL (ref 200–950)
Basophils Absolute: 78 cells/uL (ref 0–200)
Basophils Relative: 0.9 %
Eosinophils Absolute: 183 cells/uL (ref 15–500)
Eosinophils Relative: 2.1 %
HCT: 41.3 % (ref 38.5–50.0)
Hemoglobin: 14 g/dL (ref 13.2–17.1)
Lymphs Abs: 1705 cells/uL (ref 850–3900)
MCH: 31.5 pg (ref 27.0–33.0)
MCHC: 33.9 g/dL (ref 32.0–36.0)
MCV: 92.8 fL (ref 80.0–100.0)
MPV: 9.7 fL (ref 7.5–12.5)
Monocytes Relative: 8.7 %
Neutro Abs: 5977 cells/uL (ref 1500–7800)
Neutrophils Relative %: 68.7 %
Platelets: 312 10*3/uL (ref 140–400)
RBC: 4.45 10*6/uL (ref 4.20–5.80)
RDW: 12.7 % (ref 11.0–15.0)
Total Lymphocyte: 19.6 %
WBC: 8.7 10*3/uL (ref 3.8–10.8)

## 2021-11-08 LAB — HEMOGLOBIN A1C
Hgb A1c MFr Bld: 5.8 % of total Hgb — ABNORMAL HIGH (ref <5.7)
Mean Plasma Glucose: 120 mg/dL
eAG (mmol/L): 6.6 mmol/L

## 2021-11-09 NOTE — Progress Notes (Signed)
Date:  11/10/2021   ID:  Mitchell Stephens., DOB [birth year], MRN [id number]  Patient Location:  [address] [place] [address]   Provider location:   [facility], [place] office  PCP:  [name], NP  Cardiologist:  Patsy Baltimore   Chief Complaint  Patient presents with   6 month follow up     "Doing well." Medications reviewed by the patient verbally.     History of Present Illness:    Mitchell Stephens. is a 76 y.o. 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, Prior episodes of tachycardia associated with alcohol, previously declined monitor asd repair, age 40 Who presents for follow-up of his coronary artery disease, occluded RCA  Last seen in clinic by myself September 2021 Seen by one of our providers August 2022  Sprained hip, less walking Still recovering  Surgery, mrsa  Denies any chest pain concerning for angina History controlled, Diabetes mellitus controlled  Takes all of his medications in the morning, blood pressure running low No recent use of nitro  EKG personally reviewed by myself on todays visit Shows normal sinus rhythm rate 73 bpm consider old inferior MI  Other past medical history reviewed stress testing 06/2017 which showed a large area of inferior apical scar without ischemia and an EF of 45%.    Diagnostic catheterization showed an occluded proximal right coronary artery with left to right collaterals and EF of 35-45%.  F  Echo in December showed improvement in EF to 50% with moderate MR  Cardiac catheterization June 30, 2017 Prox LAD lesion, 30 %stenosed. Prox Cx lesion, 50 %stenosed. Prox RCA lesion, 100 %stenosed. The left ventricular ejection fraction is 35-45% by visual  estimate.   Past Medical History:  Diagnosis Date   Actinic keratosis 02/10/2021   left ear mid antihelix   Alcoholism ([place])    per pt   Allergy    Basal cell carcinoma 05/13/2020   left prox medial pretibial    Basal cell carcinoma 08/26/2020   below the left medial knee - tx with ED&C    Basal cell carcinoma 02/10/2021   Right lower eyelid/lat canthus Moh's 08/07/2021   Coronary artery disease    a. 06/2017 MV: EF 45%, large inf and apical defect w/o ischemia;  b. 06/2017 Cath: LM nl, LAD 30ost, LCX 50p, OM1/2 ok, RCA 100p w/ L->R collats. EF 35%.   Depression with anxiety    Gastric ulcer, acute with hemorrhage 2001   Dr. Sharlet Salina, [facility]   H/O congenital atrial septal defect (ASD) repair    Heart murmur    ASD repair    Helicobacter pylori gastritis    Heme positive stool    History of gout    History of MRSA infection    History of peptic ulcer disease    had bleeding ulcers requiring transfusion   History of PSVT (paroxysmal supraventricular tachycardia)    Hyperlipidemia    Hypertension    Ischemic cardiomyopathy    a. 06/2017 LV gram: EF 35%.   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 Annamaria Boots ([facility]); was PFO repair--has been normal in f/u   PALATE / UVULA  BIOPSY / EXCISION  2000   Dr.Sprenhe [facility])   TONSILLECTOMY AND ADENOIDECTOMY  1959   UVULECTOMY        Allergies:   Pollen extract   Social History   Tobacco Use   Smoking status: Former    Years: 40.00    Types: Cigarettes    Quit date: 09/04/2015    Years since quitting: 6.1   Smokeless tobacco: Never  Vaping Use   Vaping Use: Never used  Substance Use Topics   Alcohol use: No    Alcohol/week: 0.0 standard drinks    Comment: previously did drink alcohol heavily   Drug use: No    Comment: likes to avoid pain meds, no illicit drugs used     Current Outpatient Medications on File Prior to Visit  Medication Sig Dispense Refill   aspirin EC 81 MG tablet Take 81 mg by mouth daily.     buPROPion (WELLBUTRIN XL) 300 MG 24 hr tablet Take 1 tablet (300 mg total) by mouth daily. 90 tablet 1   colchicine 0.6 MG tablet Take 0.6 mg by mouth as needed.     ezetimibe (ZETIA) 10 MG tablet Take 1 tablet (10 mg total) by mouth daily. 90 tablet 3   hydrochlorothiazide (HYDRODIURIL) 25 MG tablet TAKE 1 TABLET (25 MG TOTAL) BY MOUTH DAILY AS NEEDED. 90 tablet 1   isosorbide mononitrate (IMDUR) 30 MG 24 hr tablet TAKE 1/2 OF A TABLET (15 MG TOTAL) BY MOUTH DAILY 45 tablet 1   losartan (COZAAR) 25 MG tablet Take 1 tablet (25 mg total) by mouth daily. 90 tablet 1   metoprolol succinate (TOPROL-XL) 100 MG 24 hr tablet TAKE 1 TABLET BY MOUTH EVERY DAY 90 tablet 2   nitroGLYCERIN (NITROSTAT) 0.4 MG SL tablet Place 1 tablet (0.4 mg total) under the tongue every 5 (five) minutes as needed for chest pain. 25 tablet 1   propranolol (INDERAL) 20 MG tablet TAKE 1 TABLET (20 MG TOTAL) BY MOUTH AS NEEDED (AS NEEDED FOR FAST HEART RATES). 90 tablet 1   senna (SENOKOT) 8.6 MG tablet Take 2 tablets by mouth at bedtime.     sertraline (ZOLOFT) 50 MG tablet Take 1 tablet (50 mg total) by mouth daily. 30 tablet 3   simvastatin (ZOCOR) 40 MG tablet Take 1 tablet (40 mg total) by mouth daily. 90 tablet 1    spironolactone (ALDACTONE) 25 MG tablet TAKE 1 TABLET BY MOUTH EVERY DAY 90 tablet 1   traZODone (DESYREL) 50 MG tablet TAKE 1 TABLET (50 MG TOTAL) BY MOUTH AT BEDTIME AS NEEDED. FOR INSOMNIA 90 tablet 1   No current facility-administered medications on file prior to visit.     Family Hx: The patient's family history includes  Arthritis in his mother; Brain cancer in his father and sister; Cancer in his paternal grandfather; Failure to thrive in his mother; Hypertension in his sister; Liver cancer in his paternal uncle; Other in his mother and another family member; Stomach cancer in his maternal grandmother. There is no history of Colon cancer, Colon polyps, Esophageal cancer, or Rectal cancer.  ROS:   Please see the history of present illness.    Review of Systems  Constitutional: Negative.   HENT: Negative.    Respiratory: Negative.    Cardiovascular: Negative.   Gastrointestinal: Negative.   Musculoskeletal: Negative.   Neurological: Negative.   Psychiatric/Behavioral: Negative.    All other systems reviewed and are negative.   Labs/Other Tests and Data Reviewed:    Recent Labs: 11/07/2021: ALT 15; BUN 24; Creat 1.64; Hemoglobin 14.0; Platelets 312; Potassium 4.5; Sodium 135   Recent Lipid Panel Lab Results  Component Value Date/Time   CHOL 122 11/07/2021 12:00 AM   CHOL 128 01/09/2016 08:25 AM   TRIG 150 (H) 11/07/2021 12:00 AM   HDL 47 11/07/2021 12:00 AM   HDL 43 01/09/2016 08:25 AM   CHOLHDL 2.6 11/07/2021 12:00 AM   LDLCALC 52 11/07/2021 12:00 AM    Wt Readings from Last 3 Encounters:  11/10/21 211 lb 2 oz (95.8 kg)  11/07/21 209 lb (94.8 kg)  05/09/21 211 lb 6 oz (95.9 kg)     Exam:    Vital Signs: Vital signs may also be detailed in the HPI BP 100/62 (BP Location: Left Arm, Patient Position: Sitting, Cuff Size: Normal)    Pulse 73    Ht 6\' 1"  (1.854 m)    Wt 211 lb 2 oz (95.8 kg)    SpO2 98%    BMI 27.85 kg/m    Constitutional:  oriented to person, place,  and time. No distress.  HENT:  Head: Grossly normal Eyes:  no discharge. No scleral icterus.  Neck: No JVD, no carotid bruits  Cardiovascular: Regular rate and rhythm, no murmurs appreciated Pulmonary/Chest: Clear to auscultation bilaterally, no wheezes or rails Abdominal: Soft.  no distension.  no tenderness.  Musculoskeletal: Normal range of motion Neurological:  normal muscle tone. Coordination normal. No atrophy Skin: Skin warm and dry Psychiatric: normal affect, pleasant   ASSESSMENT & PLAN:    Problem List Items Addressed This Visit       Cardiology Problems   Paroxysmal SVT (supraventricular tachycardia) (HCC) - Primary   Relevant Orders   EKG 12-Lead   Atherosclerosis of native coronary artery of native heart with stable angina pectoris ([place])   Relevant Orders   EKG 12-Lead   Cardiomyopathy ([place])   Essential hypertension, benign   Hyperlipidemia   Other Visit Diagnoses     Chronic systolic heart failure (HCC)       Paroxysmal tachycardia (HCC)          Tachycardia Well-controlled on metoprolol succinate 100 daily  CAD with stable angina Currently with no symptoms of angina. No further workup at this time. Continue current medication regimen.  Hyperlipidemia Cholesterol is at goal on the current lipid regimen. No changes to the medications were made.  Hypertension Blood pressure consistently running low, will move to of his medications to the evening Currently taking all his medications in the morning   Total encounter time more than 25 minutes  Greater than 50% was spent in counseling and coordination of care with the patient   Signed, Ida Rogue, [place]  [employer] [address], [place], [place] [address]

## 2021-11-10 ENCOUNTER — Encounter: Payer: Self-pay | Admitting: Cardiovascular Disease

## 2021-11-10 ENCOUNTER — Ambulatory Visit: Payer: Medicare HMO | Admitting: Cardiovascular Disease

## 2021-11-10 ENCOUNTER — Other Ambulatory Visit: Payer: Self-pay

## 2021-11-10 VITALS — BP 100/62 | HR 73 | Ht 73.0 in | Wt 211.1 lb

## 2021-11-10 DIAGNOSIS — I471 Supraventricular tachycardia: Secondary | ICD-10-CM | POA: Diagnosis not present

## 2021-11-10 DIAGNOSIS — I5022 Chronic systolic (congestive) heart failure: Secondary | ICD-10-CM | POA: Diagnosis not present

## 2021-11-10 DIAGNOSIS — I255 Ischemic cardiomyopathy: Secondary | ICD-10-CM | POA: Diagnosis not present

## 2021-11-10 DIAGNOSIS — I10 Essential (primary) hypertension: Secondary | ICD-10-CM | POA: Diagnosis not present

## 2021-11-10 DIAGNOSIS — E782 Mixed hyperlipidemia: Secondary | ICD-10-CM | POA: Diagnosis not present

## 2021-11-10 DIAGNOSIS — I25118 Atherosclerotic heart disease of native coronary artery with other forms of angina pectoris: Secondary | ICD-10-CM | POA: Diagnosis not present

## 2021-11-10 DIAGNOSIS — I479 Paroxysmal tachycardia, unspecified: Secondary | ICD-10-CM | POA: Diagnosis not present

## 2021-11-10 NOTE — Patient Instructions (Addendum)
Medication Instructions:  Please consider moving the isosorbide and losartan to dinner (evening time)  If you need a refill on your cardiac medications before your next appointment, please call your pharmacy.   Lab work: No new labs needed  Testing/Procedures: No new testing needed  Follow-Up: At [facility], you and your health needs are our priority.  As part of our continuing mission to provide you with exceptional heart care, we have created designated Provider Care Teams.  These Care Teams include your primary Cardiologist (physician) and Advanced Practice Providers (APPs -  Physician Assistants and Nurse Practitioners) who all work together to provide you with the care you need, when you need it.  You will need a follow up appointment in 12 months  Providers on your designated Care Team:   Murray Hodgkins, NP Christell Faith, PA-C Cadence Kathlen Mody, [place]  COVID-19 Vaccine Information can be found at: [email or web address].uk For questions related to vaccine distribution or appointments, please email [email or web address] or call [phone].

## 2021-11-14 ENCOUNTER — Other Ambulatory Visit: Payer: Self-pay | Admitting: *Deleted

## 2021-11-14 MED ORDER — SPIRONOLACTONE 25 MG PO TABS: 25.0000 mg | ORAL_TABLET | Freq: Every day | ORAL | 1 refills | Status: DC

## 2021-11-14 NOTE — Telephone Encounter (Signed)
Patient requested refill. Needs almost out.  Pended and sent to [facility] for approval due to [place].

## 2021-11-20 ENCOUNTER — Ambulatory Visit (INDEPENDENT_AMBULATORY_CARE_PROVIDER_SITE_OTHER): Payer: Medicare HMO | Admitting: Dermatology

## 2021-11-20 ENCOUNTER — Other Ambulatory Visit: Payer: Self-pay

## 2021-11-20 DIAGNOSIS — Z85828 Personal history of other malignant neoplasm of skin: Secondary | ICD-10-CM | POA: Diagnosis not present

## 2021-11-20 DIAGNOSIS — L821 Other seborrheic keratosis: Secondary | ICD-10-CM | POA: Diagnosis not present

## 2021-11-20 DIAGNOSIS — D229 Melanocytic nevi, unspecified: Secondary | ICD-10-CM | POA: Diagnosis not present

## 2021-11-20 DIAGNOSIS — D1800 Hemangioma unspecified site: Secondary | ICD-10-CM | POA: Diagnosis not present

## 2021-11-20 DIAGNOSIS — L814 Other melanin hyperpigmentation: Secondary | ICD-10-CM | POA: Diagnosis not present

## 2021-11-20 DIAGNOSIS — L570 Actinic keratosis: Secondary | ICD-10-CM

## 2021-11-20 DIAGNOSIS — D099 Carcinoma in situ, unspecified: Secondary | ICD-10-CM

## 2021-11-20 DIAGNOSIS — D045 Carcinoma in situ of skin of trunk: Secondary | ICD-10-CM | POA: Diagnosis not present

## 2021-11-20 DIAGNOSIS — Z1283 Encounter for screening for malignant neoplasm of skin: Secondary | ICD-10-CM

## 2021-11-20 DIAGNOSIS — L578 Other skin changes due to chronic exposure to nonionizing radiation: Secondary | ICD-10-CM | POA: Diagnosis not present

## 2021-11-20 DIAGNOSIS — D0439 Carcinoma in situ of skin of other parts of face: Secondary | ICD-10-CM | POA: Diagnosis not present

## 2021-11-20 DIAGNOSIS — D492 Neoplasm of unspecified behavior of bone, soft tissue, and skin: Secondary | ICD-10-CM

## 2021-11-20 HISTORY — DX: Carcinoma in situ, unspecified: D09.9

## 2021-11-20 NOTE — Progress Notes (Addendum)
Follow-Up Visit   Subjective  Mitchell Stephens. is a 76 y.o. male who presents for the following: Actinic Keratosis (3 months f/u hx of Aks ). The patient presents for Upper Body Skin Exam (UBSE) for skin cancer screening and mole check.  The patient has spots, moles and lesions to be evaluated, some may be new or changing and the patient has concerns that these could be cancer.   The following portions of the chart were reviewed this encounter and updated as appropriate:   Tobacco   Allergies   Meds   Problems   Med Hx   Surg Hx   Fam Hx      Review of Systems:  No other skin or systemic complaints except as noted in HPI or Assessment and Plan.  Objective  Well appearing patient in no apparent distress; mood and affect are within normal limits.  All skin waist up examined.  left chest parasternal inferior 2.1 cm pink plaque      right xyphoid 2.1 cm pink plaque      left chest parasternal sup 1.1 cm pink plaque     face, ears x 6 (6) Erythematous thin papules/macules with gritty scale.    Assessment & Plan  Neoplasm of skin (3) left chest parasternal inferior  Skin / nail biopsy Type of biopsy: tangential   Informed consent: discussed and consent obtained   Patient was prepped and draped in usual sterile fashion: area prepped with alochol. Anesthesia: the lesion was anesthetized in a standard fashion   Anesthetic:  1% lidocaine w/ epinephrine 1-100,000 buffered w/ 8.4% NaHCO3 Instrument used: flexible razor blade   Hemostasis achieved with: pressure, aluminum chloride and electrodesiccation   Outcome: patient tolerated procedure well   Post-procedure details: wound care instructions given   Post-procedure details comment:  Ointment and small bandage  Specimen 1 - Surgical pathology Differential Diagnosis: R/O SCC  Check Margins: No  right xyphoid  Skin / nail biopsy Type of biopsy: tangential   Informed consent: discussed and consent obtained    Timeout: patient name, date of birth, surgical site, and procedure verified   Procedure prep:  Patient was prepped and draped in usual sterile fashion Prep type:  Isopropyl alcohol Anesthesia: the lesion was anesthetized in a standard fashion   Anesthetic:  1% lidocaine w/ epinephrine 1-100,000 buffered w/ 8.4% NaHCO3 Instrument used: flexible razor blade   Hemostasis achieved with: aluminum chloride   Outcome: patient tolerated procedure well   Post-procedure details: wound care instructions given   Additional details:  Petrolatum and a pressure bandage applied  Specimen 2 - Surgical pathology Differential Diagnosis: R/O SCC  Check Margins: No  left chest parasternal sup  Epidermal / dermal shaving  Lesion diameter (cm):  1.1 Informed consent: discussed and consent obtained   Timeout: patient name, date of birth, surgical site, and procedure verified   Procedure prep:  Patient was prepped and draped in usual sterile fashion Prep type:  Isopropyl alcohol Anesthesia: the lesion was anesthetized in a standard fashion   Anesthetic:  1% lidocaine w/ epinephrine 1-100,000 buffered w/ 8.4% NaHCO3 Hemostasis achieved with: pressure, aluminum chloride and electrodesiccation   Outcome: patient tolerated procedure well   Post-procedure details: sterile dressing applied and wound care instructions given   Dressing type: bandage and petrolatum    Destruction of lesion  Destruction method: electrodesiccation and curettage   Informed consent: discussed and consent obtained   Timeout:  patient name, date of birth, surgical site, and  procedure verified Anesthesia: the lesion was anesthetized in a standard fashion   Anesthetic:  1% lidocaine w/ epinephrine 1-100,000 buffered w/ 8.4% NaHCO3 Curettage performed in three different directions: Yes   Electrodesiccation performed over the curetted area: Yes   Curettage cycles:  3 Lesion length (cm):  1.1 Lesion width (cm):  1.1 Margin per  side (cm):  0.2 Final wound size (cm):  1.5 Hemostasis achieved with:  electrodesiccation Outcome: patient tolerated procedure well with no complications   Post-procedure details: sterile dressing applied and wound care instructions given   Dressing type: petrolatum    Specimen 3 - Surgical pathology Differential Diagnosis: R/O SCC   Check Margins: No  AK (actinic keratosis) (6) face, ears x 6  Actinic keratoses are precancerous spots that appear secondary to cumulative UV radiation exposure/sun exposure over time. They are chronic with expected duration over 1 year. A portion of actinic keratoses will progress to squamous cell carcinoma of the skin. It is not possible to reliably predict which spots will progress to skin cancer and so treatment is recommended to prevent development of skin cancer.  Recommend daily broad spectrum sunscreen SPF 30+ to sun-exposed areas, reapply every 2 hours as needed.  Recommend staying in the shade or wearing long sleeves, sun glasses (UVA+UVB protection) and wide brim hats (4-inch brim around the entire circumference of the hat). Call for new or changing lesions.   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 - Call for any changes  Seborrheic Keratoses - Stuck-on, waxy,  tan-brown papules and/or plaques  - Benign-appearing - Discussed benign etiology and prognosis. - Observe - Call for any changes  Melanocytic Nevi - Tan-brown and/or pink-flesh-colored symmetric macules and papules - Benign appearing on exam today - Observation - Call clinic for new or changing moles - Recommend daily use of broad spectrum spf 30+ sunscreen to sun-exposed areas.   Hemangiomas - Red papules - Discussed benign nature - Observe - Call for any changes  Actinic Damage - Chronic condition, secondary to cumulative UV/sun exposure - diffuse scaly erythematous macules with underlying dyspigmentation - Recommend daily broad spectrum sunscreen SPF 30+ to sun-exposed areas, reapply every 2 hours as needed.  - Staying in the shade or wearing long sleeves, sun glasses (UVA+UVB protection) and wide brim hats (4-inch brim around the entire circumference of the hat) are also recommended for sun protection.  - Call for new or changing lesions.  History of Squamous Cell Carcinoma of the Skin right lat pectoral - No evidence of recurrence today - No lymphadenopathy - Recommend regular full body skin exams - Recommend daily broad spectrum sunscreen SPF 30+ to sun-exposed areas, reapply every 2 hours as needed.  - Call if any new or changing lesions are noted between office visits  Skin cancer screening performed today.   Return in about 6 months (  around 05/20/2022) for TBSE, hx of SCC .  IMarye Round, CMA, am acting as scribe for Sarina Ser, MD .  Documentation: I have reviewed the above documentation for accuracy and completeness, and I agree with the above.  Sarina Ser, MD

## 2021-11-20 NOTE — Patient Instructions (Addendum)
Cryotherapy Aftercare  Wash gently with soap and water everyday.   Apply Vaseline and Band-Aid daily until healed.    Wound Care Instructions  Cleanse wound gently with soap and water once a day then pat dry with clean gauze. Apply a thing coat of Petrolatum (petroleum jelly, "Vaseline") over the wound (unless you have an allergy to this). We recommend that you use a new, sterile tube of Vaseline. Do not pick or remove scabs. Do not remove the yellow or white "healing tissue" from the base of the wound.  Cover the wound with fresh, clean, nonstick gauze and secure with paper tape. You may use Band-Aids in place of gauze and tape if the would is small enough, but would recommend trimming much of the tape off as there is often too much. Sometimes Band-Aids can irritate the skin.  You should call the office for your biopsy report after 1 week if you have not already been contacted.  If you experience any problems, such as abnormal amounts of bleeding, swelling, significant bruising, significant pain, or evidence of infection, please call the office immediately.  FOR ADULT SURGERY PATIENTS: If you need something for pain relief you may take 1 extra strength Tylenol (acetaminophen) AND 2 Ibuprofen (200mg each) together every 4 hours as needed for pain. (do not take these if you are allergic to them or if you have a reason you should not take them.) Typically, you may only need pain medication for 1 to 3 days.        If You Need Anything After Your Visit  If you have any questions or concerns for your doctor, please call our main line at [phone] and press option 4 to reach your doctor's medical assistant. If no one answers, please leave a voicemail as directed and we will return your call as soon as possible. Messages left after 4 pm will be answered the following business day.   You may also send us a message via MyChart. We typically respond to MyChart messages within 1-2 business  days.  For prescription refills, please ask your pharmacy to contact our office. Our fax number is [phone].  If you have an urgent issue when the clinic is closed that cannot wait until the next business day, you can page your doctor at the number below.    Please note that while we do our best to be available for urgent issues outside of office hours, we are not available 24/7.   If you have an urgent issue and are unable to reach us, you may choose to seek medical care at your doctor's office, retail clinic, urgent care center, or emergency room.  If you have a medical emergency, please immediately call 911 or go to the emergency department.  Pager Numbers  - Dr. Kowalski: [phone]  - Dr. Moye: [phone]  - Dr. Stewart: [phone]  In the event of inclement weather, please call our main line at [phone] for an update on the status of any delays or closures.  Dermatology Medication Tips: Please keep the boxes that topical medications come in in order to help keep track of the instructions about where and how to use these. Pharmacies typically print the medication instructions only on the boxes and not directly on the medication tubes.   If your medication is too expensive, please contact our office at [phone] option 4 or send us a message through MyChart.   We are unable to tell what your co-pay for medications will be   in advance as this is different depending on your insurance coverage. However, we may be able to find a substitute medication at lower cost or fill out paperwork to get insurance to cover a needed medication.   If a prior authorization is required to get your medication covered by your insurance company, please allow us 1-2 business days to complete this process.  Drug prices often vary depending on where the prescription is filled and some pharmacies may offer cheaper prices.  The website www.goodrx.com contains coupons for medications through  different pharmacies. The prices here do not account for what the cost may be with help from insurance (it may be cheaper with your insurance), but the website can give you the price if you did not use any insurance.  - You can print the associated coupon and take it with your prescription to the pharmacy.  - You may also stop by our office during regular business hours and pick up a GoodRx coupon card.  - If you need your prescription sent electronically to a different pharmacy, notify our office through [facility] MyChart or by phone at [phone] option 4.     Si Usted Necesita Algo Despus de Su Visita  Tambin puede enviarnos un mensaje a travs de MyChart. Por lo general respondemos a los mensajes de MyChart en el transcurso de 1 a 2 das hbiles.  Para renovar recetas, por favor pida a su farmacia que se ponga en contacto con nuestra oficina. Nuestro nmero de fax es el [phone].  Si tiene un asunto urgente cuando la clnica est cerrada y que no puede esperar hasta el siguiente da hbil, puede llamar/localizar a su doctor(a) al nmero que aparece a continuacin.   Por favor, tenga en cuenta que aunque hacemos todo lo posible para estar disponibles para asuntos urgentes fuera del horario de oficina, no estamos disponibles las 24 horas del da, los 7 das de la semana.   Si tiene un problema urgente y no puede comunicarse con nosotros, puede optar por buscar atencin mdica  en el consultorio de su doctor(a), en una clnica privada, en un centro de atencin urgente o en una sala de emergencias.  Si tiene una emergencia mdica, por favor llame inmediatamente al 911 o vaya a la sala de emergencias.  Nmeros de bper  - Dr. Kowalski: [phone]  - Dra. Moye: [phone]  - Dra. Stewart: [phone]  En caso de inclemencias del tiempo, por favor llame a nuestra lnea principal al [phone] para una actualizacin sobre el estado de cualquier retraso o cierre.  Consejos  para la medicacin en dermatologa: Por favor, guarde las cajas en las que vienen los medicamentos de uso tpico para ayudarle a seguir las instrucciones sobre dnde y cmo usarlos. Las farmacias generalmente imprimen las instrucciones del medicamento slo en las cajas y no directamente en los tubos del medicamento.   Si su medicamento es muy caro, por favor, pngase en contacto con nuestra oficina llamando al [phone] y presione la opcin 4 o envenos un mensaje a travs de MyChart.   No podemos decirle cul ser su copago por los medicamentos por adelantado ya que esto es diferente dependiendo de la cobertura de su seguro. Sin embargo, es posible que podamos encontrar un medicamento sustituto a menor costo o llenar un formulario para que el seguro cubra el medicamento que se considera necesario.   Si se requiere una autorizacin previa para que su compaa de seguros cubra su medicamento, por favor permtanos de 1 a   2 das hbiles para completar este proceso.  Los precios de los medicamentos varan con frecuencia dependiendo del lugar de dnde se surte la receta y alguna farmacias pueden ofrecer precios ms baratos.  El sitio web www.goodrx.com tiene cupones para medicamentos de diferentes farmacias. Los precios aqu no tienen en cuenta lo que podra costar con la ayuda del seguro (puede ser ms barato con su seguro), pero el sitio web puede darle el precio si no utiliz ningn seguro.  - Puede imprimir el cupn correspondiente y llevarlo con su receta a la farmacia.  - Tambin puede pasar por nuestra oficina durante el horario de atencin regular y recoger una tarjeta de cupones de GoodRx.  - Si necesita que su receta se enve electrnicamente a una farmacia diferente, informe a nuestra oficina a travs de MyChart de [facility] o por telfono llamando al [phone] y presione la opcin 4.  

## 2021-11-24 ENCOUNTER — Telehealth: Payer: Self-pay

## 2021-11-24 NOTE — Telephone Encounter (Signed)
-----   Message from Ralene Bathe, MD sent at 11/21/2021  5:40 PM EST ----- Diagnosis 1. 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 Skin , left chest parasternal sup SQUAMOUS CELL CARCINOMA IN SITU  1&2 - both Cancer - SCCis Schedule for treatment (EDC) 3- Cancer - SCCis Already treated Recheck next visit

## 2021-11-24 NOTE — Telephone Encounter (Signed)
Discussed biopsy results with pt return for [facility]

## 2021-11-25 ENCOUNTER — Other Ambulatory Visit: Payer: Self-pay | Admitting: *Deleted

## 2021-11-25 DIAGNOSIS — I10 Essential (primary) hypertension: Secondary | ICD-10-CM

## 2021-11-25 MED ORDER — LOSARTAN POTASSIUM 25 MG PO TABS: 25.0000 mg | ORAL_TABLET | Freq: Every day | ORAL | 1 refills | Status: DC

## 2021-11-25 NOTE — Telephone Encounter (Signed)
Pharmacy requested refill. Pended Rx and sent to [name] for approval due to HIGH ALERT Warning.  

## 2021-11-29 ENCOUNTER — Encounter: Payer: Self-pay | Admitting: Dermatology

## 2021-12-03 DIAGNOSIS — G4733 Obstructive sleep apnea (adult) (pediatric): Secondary | ICD-10-CM | POA: Diagnosis not present

## 2021-12-08 ENCOUNTER — Ambulatory Visit
Admission: RE | Admit: 2021-12-08 | Discharge: 2021-12-08 | Disposition: A | Discharge status: Home / Self Care | Payer: Medicare HMO | Source: Ambulatory Visit | Attending: Nurse Practitioner | Admitting: Nurse Practitioner

## 2021-12-08 DIAGNOSIS — Z87891 Personal history of nicotine dependence: Secondary | ICD-10-CM | POA: Diagnosis not present

## 2021-12-17 ENCOUNTER — Ambulatory Visit: Payer: Medicare HMO | Admitting: Dermatology

## 2021-12-17 ENCOUNTER — Other Ambulatory Visit: Payer: Self-pay | Admitting: Dermatology

## 2021-12-17 ENCOUNTER — Encounter: Payer: Self-pay | Admitting: Dermatology

## 2021-12-17 ENCOUNTER — Other Ambulatory Visit: Payer: Self-pay

## 2021-12-17 DIAGNOSIS — D099 Carcinoma in situ, unspecified: Secondary | ICD-10-CM

## 2021-12-17 DIAGNOSIS — L578 Other skin changes due to chronic exposure to nonionizing radiation: Secondary | ICD-10-CM

## 2021-12-17 DIAGNOSIS — C44519 Basal cell carcinoma of skin of other part of trunk: Secondary | ICD-10-CM

## 2021-12-17 DIAGNOSIS — D485 Neoplasm of uncertain behavior of skin: Secondary | ICD-10-CM

## 2021-12-17 DIAGNOSIS — D045 Carcinoma in situ of skin of trunk: Secondary | ICD-10-CM

## 2021-12-17 DIAGNOSIS — Z86007 Personal history of in-situ neoplasm of skin: Secondary | ICD-10-CM

## 2021-12-17 NOTE — Progress Notes (Signed)
? ?Follow-Up Visit ?  ?Subjective  ?Mitchell Apley Blubaugh Brooke Bonito. is a 76 y.o. 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 ). The patient has spots, moles and lesions to be evaluated, some may be new or changing and the patient has concerns that these could be cancer. ? ?The following portions of the chart were reviewed this encounter and updated as appropriate:  ? Tobacco  Allergies  Meds  Problems  Med Hx  Surg Hx  Fam Hx   ?  ?Review of Systems:  No other skin or systemic complaints except as noted in HPI or Assessment and Plan. ? ?Objective  ?Well appearing patient in no apparent distress; mood and affect are within normal limits. ? ?A focused examination was performed including the face and trunk. Relevant physical exam findings are noted in the Assessment and Plan. ? ?R Xyphoid ?Pink biopsy site. 2.2 cm post tx defect.  ? ?L chest parasternal inferior ?Pink biopsy site. 2.7 post tx defect  ? ?L epigastric ?1.5 cm pink patch  ? ? 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 ?Destruction of lesion ?Complexity: extensive   ?Destruction method: electrodesiccation and curettage   ?Informed consent: discussed and consent obtained   ?Timeout:  patient name, date of birth, surgical site, and procedure verified ?Procedure prep:  Patient was prepped and draped in usual sterile fashion ?Prep type:  Isopropyl alcohol ?Anesthesia: the lesion was anesthetized in a standard fashion   ?Anesthetic:  1% lidocaine w/ epinephrine 1-100,000 buffered w/ 8.4% NaHCO3 ?Curettage performed in three different directions: Yes   ?Electrodesiccation performed over the curetted area: Yes   ?Final wound size (cm):  2.2 ?Hemostasis achieved with:  pressure, aluminum chloride and electrodesiccation ?Outcome: patient tolerated procedure well with no complications   ?Post-procedure details: sterile dressing  applied and wound care instructions given   ?Dressing type: bandage and petrolatum   ? ?L chest parasternal inferior ? ?Destruction of lesion ?Complexity: extensive   ?Destruction method: electrodesiccation and curettage   ?Informed consent: discussed and consent obtained   ?Timeout:  patient name, date of birth, surgical site, and procedure verified ?Procedure prep:  Patient was prepped and draped in usual sterile fashion ?Prep type:  Isopropyl alcohol ?Anesthesia: the lesion was anesthetized in a standard fashion   ?Anesthetic:  1% lidocaine w/ epinephrine 1-100,000 buffered w/ 8.4% NaHCO3 ?Curettage performed in three different directions: Yes   ?Electrodesiccation performed over the curetted area: Yes   ?Final wound size (cm):  2.7 ?Hemostasis achieved with:  pressure, aluminum chloride and electrodesiccation ?Outcome: patient tolerated procedure well with no complications   ?Post-procedure details: sterile dressing applied and wound care instructions given   ?Dressing type: bandage and petrolatum   ? ?Biopsy proven SCCIS  ? ?Neoplasm of uncertain behavior of skin ?L epigastric ? ?Epidermal / dermal shaving ? ?Lesion diameter (cm):  1.5 ?Informed consent: discussed and consent obtained   ?Timeout: patient name, date of birth, surgical site, and procedure verified   ?Procedure prep:  Patient was prepped and draped in usual sterile fashion ?Prep type:  Isopropyl alcohol ?Anesthesia: the lesion was anesthetized in a standard fashion   ?Anesthetic:  1% lidocaine w/ epinephrine 1-100,000 buffered w/ 8.4% NaHCO3 ?Instrument used: flexible razor blade   ?Hemostasis achieved with: pressure, aluminum chloride and electrodesiccation   ?Outcome: patient tolerated procedure well   ?Post-procedure  details: sterile dressing applied and wound care instructions given   ?Dressing type: bandage and petrolatum   ? ?Destruction of lesion ?Complexity: extensive   ?Destruction method: electrodesiccation and curettage   ?Informed  consent: discussed and consent obtained   ?Timeout:  patient name, date of birth, surgical site, and procedure verified ?Procedure prep:  Patient was prepped and draped in usual sterile fashion ?Prep type:  Isopropyl alcohol ?Anesthesia: the lesion was anesthetized in a standard fashion   ?Anesthetic:  1% lidocaine w/ epinephrine 1-100,000 buffered w/ 8.4% NaHCO3 ?Curettage performed in three different directions: Yes   ?Electrodesiccation performed over the curetted area: Yes   ?Lesion length (cm):  1.5 ?Lesion width (cm):  1.5 ?Margin per side (cm):  0.2 ?Final wound size (cm):  1.9 ?Hemostasis achieved with:  pressure, aluminum chloride and electrodesiccation ?Outcome: patient tolerated procedure well with no complications   ?Post-procedure details: sterile dressing applied and wound care instructions given   ?Dressing type: bandage and petrolatum   ? ?Specimen 1 - Surgical pathology ?Differential Diagnosis: D48.5 r/o BCC  ?ED&C today ?Check Margins: No ? ?Actinic Damage ?- chronic, secondary to cumulative UV radiation exposure/sun exposure over time ?- diffuse scaly erythematous macules with underlying dyspigmentation ?- Recommend daily broad spectrum sunscreen SPF 30+ to sun-exposed areas, reapply every 2 hours as needed.  ?- Recommend staying in the shade or wearing long sleeves, sun glasses (UVA+UVB protection) and wide brim hats (4-inch brim around the entire circumference of the hat). ?- Call for new or changing lesions. ? ?History of Squamous Cell Carcinoma in Situ of the Skin - L chest parasternal superior ?- No evidence of recurrence today ?- Recommend regular full body skin exams ?- Recommend daily broad spectrum sunscreen SPF 30+ to sun-exposed areas, reapply every 2 hours as needed.  ?- Call if any new or changing lesions are noted between office visits ? ?Return for appointment as scheduled. ? ?I, Rudell Cobb, CMA, am acting as scribe for Sarina Ser, MD . ?Documentation: I have reviewed the  above documentation for accuracy and completeness, and I agree with the above. ? ?Sarina Ser, MD ? ?

## 2021-12-17 NOTE — Patient Instructions (Addendum)
Electrodesiccation and Curettage (?Scrape and Burn?) Wound Care Instructions ? ?Leave the original bandage on for 24 hours if possible.  If the bandage becomes soaked or soiled before that time, it is OK to remove it and examine the wound.  A small amount of post-operative bleeding is normal.  If excessive bleeding occurs, remove the bandage, place gauze over the site and apply continuous pressure (no peeking) over the area for 30 minutes. If this does not work, please call our clinic as soon as possible or page your doctor if it is after hours.  ? ?Once a day, cleanse the wound with soap and water. It is fine to shower. If a thick crust develops you may use a Q-tip dipped into dilute hydrogen peroxide (mix 1:1 with water) to dissolve it.  Hydrogen peroxide can slow the healing process, so use it only as needed.   ? ?After washing, apply petroleum jelly (Vaseline) or an antibiotic ointment if your doctor prescribed one for you, followed by a bandage.   ? ?For best healing, the wound should be covered with a layer of ointment at all times. If you are not able to keep the area covered with a bandage to hold the ointment in place, this may mean re-applying the ointment several times a day.  Continue this wound care until the wound has healed and is no longer open. It may take several weeks for the wound to heal and close. ? ?Itching and mild discomfort is normal during the healing process. ? ?If you have any discomfort, you can take Tylenol (acetaminophen) or ibuprofen as directed on the bottle. (Please do not take these if you have an allergy to them or cannot take them for another reason). ? ?Some redness, tenderness and white or yellow material in the wound is normal healing.  If the area becomes very sore and red, or develops a thick yellow-green material (pus), it may be infected; please notify us.   ? ?Wound healing continues for up to one year following surgery. It is not unusual to experience pain in the scar  from time to time during the interval.  If the pain becomes severe or the scar thickens, you should notify the office.   ? ?A slight amount of redness in a scar is expected for the first six months.  After six months, the redness will fade and the scar will soften and fade.  The color difference becomes less noticeable with time.  If there are any problems, return for a post-op surgery check at your earliest convenience. ? ?To improve the appearance of the scar, you can use silicone scar gel, cream, or sheets (such as Mederma or Serica) every night for up to one year. These are available over the counter (without a prescription). ? ?Please call our office at [phone] for any questions or concerns. ? ? ?If You Need Anything After Your Visit ? ?If you have any questions or concerns for your doctor, please call our main line at [phone] and press option 4 to reach your doctor's medical assistant. If no one answers, please leave a voicemail as directed and we will return your call as soon as possible. Messages left after 4 pm will be answered the following business day.  ? ?You may also send [place] a message via MyChart. We typically respond to MyChart messages within 1-2 business days. ? ?For prescription refills, please ask your pharmacy to contact our office. Our fax number is [phone]. ? ?If you have  an urgent issue when the clinic is closed that cannot wait until the next business day, you can page your doctor at the number below.   ? ?Please note that while we do our best to be available for urgent issues outside of office hours, we are not available 24/7.  ? ?If you have an urgent issue and are unable to reach [place], you may choose to seek medical care at your doctor's office, retail clinic, urgent care center, or emergency room. ? ?If you have a medical emergency, please immediately call 911 or go to the emergency department. ? ?Pager Numbers ? ?- Dr. Nehemiah Massed: [phone] ? ?- Dr. Laurence Ferrari: [phone] ? ?-  Dr. Nicole Kindred: [phone] ? ?In the event of inclement weather, please call our main line at [phone] for an update on the status of any delays or closures. ? ?Dermatology Medication Tips: ?Please keep the boxes that topical medications come in in order to help keep track of the instructions about where and how to use these. Pharmacies typically print the medication instructions only on the boxes and not directly on the medication tubes.  ? ?If your medication is too expensive, please contact our office at [phone] option 4 or send [place] a message through [facility].  ? ?We are unable to tell what your co-pay for medications will be in advance as this is different depending on your insurance coverage. However, we may be able to find a substitute medication at lower cost or fill out paperwork to get insurance to cover a needed medication.  ? ?If a prior authorization is required to get your medication covered by your insurance company, please allow [place] 1-2 business days to complete this process. ? ?Drug prices often vary depending on where the prescription is filled and some pharmacies may offer cheaper prices. ? ?The website www.goodrx.com contains coupons for medications through different pharmacies. The prices here do not account for what the cost may be with help from insurance (it may be cheaper with your insurance), but the website can give you the price if you did not use any insurance.  ?- You can print the associated coupon and take it with your prescription to the pharmacy.  ?- You may also stop by our office during regular business hours and pick up a GoodRx coupon card.  ?- If you need your prescription sent electronically to a different pharmacy, notify our office through [facility] or by phone at [phone] option 4. ? ? ? ? ?Si Usted Necesita Algo Despu?s de Su Visita ? ?Tambi?n puede enviarnos un mensaje a trav?s de MyChart. Por lo general respondemos a los mensajes de MyChart en el  transcurso de 1 a 2 d?as h?biles. ? ?Para renovar recetas, por favor pida a su farmacia que se ponga en contacto con nuestra oficina. Nuestro n?mero de fax es el [phone]. ? ?Si tiene un asunto urgente cuando la cl?nica est? cerrada y que no puede esperar hasta el siguiente d?a h?bil, puede llamar/localizar a su doctor(a) al n?mero que aparece a continuaci?n.  ? ?Por favor, tenga en cuenta que aunque hacemos todo lo posible para estar disponibles para asuntos urgentes fuera del horario de oficina, no estamos disponibles las 24 horas del d?a, los 7 d?as de la semana.  ? ?Si tiene un problema urgente y no puede comunicarse con nosotros, puede optar por buscar atenci?n m?dica  en el consultorio de su doctor(a), en una cl?nica privada, en un centro de atenci?n urgente o en una sala de  emergencias. ? ?Si tiene Engineer, maintenance (IT) m?dica, por favor llame inmediatamente al 911 o vaya a la sala de emergencias. ? ?N?meros de b?per ? ?- Dr. Nehemiah Massed: [phone] ? ?- Dra. Moye: [phone] ? ?- Dra. Nicole Kindred: [phone] ? ?En caso de inclemencias del tiempo, por favor llame a nuestra l?nea principal al [phone] para una actualizaci?n sobre el estado de cualquier retraso o cierre. ? ?Consejos para la medicaci?n en dermatolog?a: ?Por favor, guarde las cajas en las que vienen los medicamentos de uso t?pico para ayudarle a seguir las instrucciones sobre d?nde y c?mo usarlos. Las farmacias generalmente imprimen las instrucciones del medicamento s?lo en las cajas y no directamente en los tubos del [place].  ? ?Si su medicamento es muy caro, por favor, p?ngase en contacto con Zigmund Daniel llamando al [phone] y presione la opci?n 4 o env?enos un mensaje a trav?s de MyChart.  ? ?No podemos decirle cu?l ser? su copago por los medicamentos por adelantado ya que esto es diferente dependiendo de la cobertura de su seguro. Sin embargo, es posible que podamos encontrar un medicamento sustituto a Electrical engineer un  formulario para que el seguro cubra el medicamento que se considera necesario.  ? ?Si se requiere Ardelia Mems autorizaci?n previa para que su compa??a de seguros [place] su medicamento, por favor perm?tanos de 1 a 2 d?a

## 2021-12-18 ENCOUNTER — Telehealth (INDEPENDENT_AMBULATORY_CARE_PROVIDER_SITE_OTHER): Payer: Medicare HMO | Admitting: Nurse Practitioner

## 2021-12-18 DIAGNOSIS — M25552 Pain in left hip: Secondary | ICD-10-CM

## 2021-12-18 DIAGNOSIS — R69 Illness, unspecified: Secondary | ICD-10-CM | POA: Diagnosis not present

## 2021-12-18 DIAGNOSIS — F339 Major depressive disorder, recurrent, unspecified: Secondary | ICD-10-CM | POA: Diagnosis not present

## 2021-12-18 DIAGNOSIS — G4700 Insomnia, unspecified: Secondary | ICD-10-CM | POA: Diagnosis not present

## 2021-12-18 NOTE — Progress Notes (Signed)
Careteam: Patient Care Team: Sharon Seller, NP as PCP - General (Geriatric Medicine) [place], Peter M, MD as PCP - Cardiology (Cardiology) Satira Mccallum, [place] as Referring Physician (Dermatology) Bud Face, MD as Referring Physician (Otolaryngology)  Advanced Directive information    Allergies  Allergen Reactions   Pollen Extract Other (See Comments)    Stuffy nose/itchy throat (Seasonal allergies)    Chief Complaint  Patient presents with   Follow-up    Follow up on medication Zoloft. Patient states he really can't tell a difference. It has maybe helped a little bit. He feels about the same.     HPI: Patient is a 76 y.o. male to follow up depression. Pt currently taking wellbutrin 300 mg daily, trazodone 50 mg at bedtime and recently started zoloft 50 mg daily.  He has been on medication for ~6 weeks. No side effects noted.  Has not noticed a significant change in mood, reports he is not worse.  He continues to have to force himself to do things.  He has changed his routine, taking medication some at night.   He is not walking like he used to- left hip pain- he feels like he hyperextended hip.  \he has exercises but has not been doing them.   Reports he stays up late but overall sleeping well. He would rather sleep than get up in the morning.  Gets up around 8:30-9, goes to bed at midnight. Reports decreased energy.   Review of Systems:  Review of Systems  Constitutional: Negative.   Neurological: Negative.   Psychiatric/Behavioral:  Positive for depression. Negative for suicidal ideas.    Past Medical History:  Diagnosis Date   Actinic keratosis 02/10/2021   left ear mid antihelix   Alcoholism (HCC)    per pt   Allergy    Basal cell carcinoma 05/13/2020   left prox medial pretibial    Basal cell carcinoma 08/26/2020   below the left medial knee - tx with ED&C    Basal cell carcinoma 02/10/2021   Right lower eyelid/lat canthus Moh's 08/07/2021    Coronary artery disease    a. 06/2017 MV: EF 45%, large inf and apical defect w/o ischemia;  b. 06/2017 Cath: LM nl, LAD 30ost, LCX 50p, OM1/2 ok, RCA 100p w/ L->R collats. EF 35%.   Depression with anxiety    Gastric ulcer, acute with hemorrhage 2001   Dr. Okey Dupre, [facility]   H/O congenital atrial septal defect (ASD) repair    Heart murmur    ASD repair    Helicobacter pylori gastritis    Heme positive stool    History of gout    History of MRSA infection    History of peptic ulcer disease    had bleeding ulcers requiring transfusion   History of PSVT (paroxysmal supraventricular tachycardia)    Hyperlipidemia    Hypertension    Ischemic cardiomyopathy    a. 06/2017 LV gram: EF 35%.   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   eft chest parasternal sup, txt'd EDC   Squamous cell carcinoma of skin 01/29/2020   right clavicle   Squamous cell  carcinoma of skin 08/13/2021   R lat pectoral - ED&C   Squamous cell carcinoma of skin 11/20/2021   L chest parasternal sup - tx with ED&C   Squamous cell carcinoma of skin 11/20/2021   L chest parasternal inf - tx with ED&C 12/17/21   Squamous cell carcinoma of skin 11/20/2021   R Xyphoid - tx with [facility] 12/17/21   Tobacco abuse    Past Surgical History:  Procedure Laterality Date   ASD REPAIR, OSTIUM PRIMUM  1966   basal cell carcinoma removal     COLONOSCOPY     EAR BIOPSY Left 2013   Dr.Cook--squamous cell ca ([facility])   LEFT HEART CATH AND CORONARY ANGIOGRAPHY Left 06/30/2017   Procedure: LEFT HEART CATH AND CORONARY ANGIOGRAPHY;  Surgeon: Antonieta Iba, MD;  Location: [facility] INVASIVE CV LAB;  Service: Cardiovascular;  Laterality:  Left;   MOHS SURGERY     ear   open heart surgery  1966   Dr. Maple Hudson ([facility]); was PFO repair--has been normal in f/u   PALATE / UVULA BIOPSY / EXCISION  2000   Dr.Sprenhe [facility])   TONSILLECTOMY AND ADENOIDECTOMY  1959   UVULECTOMY     Social History:   reports that he quit smoking about 6 years ago. His smoking use included cigarettes. He has never used smokeless tobacco. He reports that he does not drink alcohol and does not use drugs.  Family History  Problem Relation Age of Onset   Failure to thrive Mother    Arthritis Mother    Other Mother        Colectomy   Brain cancer Father    Hypertension Sister    Brain cancer Sister    Stomach cancer Maternal Grandmother    Cancer Paternal Grandfather        type unknown   Liver cancer Paternal Uncle    Other Other        mother's siblings died of cancer ( abdominal)   Colon cancer Neg Hx    Colon polyps Neg Hx    Esophageal cancer Neg Hx    Rectal cancer Neg Hx     Medications: Patient's Medications  New Prescriptions   No medications on file  Previous Medications   ASPIRIN EC 81 MG TABLET    Take 81 mg by mouth daily.   BUPROPION (WELLBUTRIN XL) 300 MG 24 HR TABLET    Take 1 tablet (300 mg total) by mouth daily.   COLCHICINE 0.6 MG TABLET    Take 0.6 mg by mouth as needed.   EZETIMIBE (ZETIA) 10 MG TABLET    Take 1 tablet (10 mg total) by mouth daily.   HYDROCHLOROTHIAZIDE (HYDRODIURIL) 25 MG TABLET    TAKE 1 TABLET (25 MG TOTAL) BY MOUTH DAILY AS NEEDED.   ISOSORBIDE MONONITRATE (IMDUR) 30 MG 24 HR TABLET    TAKE 1/2 OF A TABLET (15 MG TOTAL) BY MOUTH DAILY   LOSARTAN (COZAAR) 25 MG TABLET    Take 1 tablet (25 mg total) by mouth daily.   METOPROLOL SUCCINATE (TOPROL-XL) 100 MG 24 HR TABLET    TAKE 1 TABLET BY MOUTH EVERY DAY   NITROGLYCERIN (NITROSTAT) 0.4 MG SL TABLET    Place 1 tablet (0.4 mg total) under the tongue every 5 (five) minutes as needed for chest pain.   PROPRANOLOL (INDERAL) 20 MG TABLET    TAKE 1 TABLET (20  MG TOTAL) BY MOUTH AS NEEDED (AS NEEDED FOR FAST HEART RATES).  SENNA (SENOKOT) 8.6 MG TABLET    Take 2 tablets by mouth at bedtime.   SERTRALINE (ZOLOFT) 50 MG TABLET    Take 1 tablet (50 mg total) by mouth daily.   SIMVASTATIN (ZOCOR) 40 MG TABLET    Take 1 tablet (40 mg total) by mouth daily.   SPIRONOLACTONE (ALDACTONE) 25 MG TABLET    Take 1 tablet (25 mg total) by mouth daily.   TRAZODONE (DESYREL) 50 MG TABLET    TAKE 1 TABLET (50 MG TOTAL) BY MOUTH AT BEDTIME AS NEEDED. FOR INSOMNIA  Modified Medications   No medications on file  Discontinued Medications   No medications on file    Physical Exam:  There were no vitals filed for this visit. There is no height or weight on file to calculate BMI. Wt Readings from Last 3 Encounters:  11/10/21 211 lb 2 oz (95.8 kg)  11/07/21 209 lb (94.8 kg)  05/09/21 211 lb 6 oz (95.9 kg)    Physical Exam Constitutional:      Appearance: Normal appearance.  Neurological:     Mental Status: He is alert.  Psychiatric:        Mood and Affect: Mood normal.        Behavior: Behavior normal.    Labs reviewed: Basic Metabolic Panel: Recent Labs    03/14/21 0000 11/07/21 0000  NA 137 135  K 4.8 4.5  CL 104 101  CO2 26 26  GLUCOSE 97 104*  BUN 25 24  CREATININE 1.51* 1.64*  CALCIUM 9.6 9.8   Liver Function Tests: Recent Labs    03/14/21 0000 11/07/21 0000  AST 18 19  ALT 21 15  BILITOT 0.5 0.7  PROT 6.2 6.4   No results for input(s): LIPASE, AMYLASE in the last 8760 hours. No results for input(s): AMMONIA in the last 8760 hours. CBC: Recent Labs    03/14/21 0000 11/07/21 0000  WBC 7.4 8.7  NEUTROABS 4,869 5,977  HGB 13.6 14.0  HCT 40.7 41.3  MCV 93.8 92.8  PLT 300 312   Lipid Panel: Recent Labs    03/14/21 0000 11/07/21 0000  CHOL 124 122  HDL 45 47  LDLCALC 56 52  TRIG 150* 150*  CHOLHDL 2.8 2.6   TSH: No results for input(s): TSH in the last 8760 hours. A1C: Lab Results  Component Value Date    HGBA1C 5.8 (H) 11/07/2021     Assessment/Plan 1. Left hip pain Ongoing but has improved, he will start hip exercises for strengthen, if persist will notify, dicussed PT for further eval and treatment.   2. Depression, recurrent (HCC) -stable, will continue current medication regimen, he will start more lifestyle modifications such as increasing activity, positivity journaling, etc .   3. Insomnia Doing well at this time, will continue current regimen.   Next appt: 05/15/2022  Janene Harvey. Biagio Borg  [facility] [phone]    Virtual Visit via Earleen Reaper  I connected with patient on 12/18/21 at  9:30 AM EDT by video and verified that I am speaking with the correct person using two identifiers.  Location: Patient: home Provider: [place]    I discussed the limitations, risks, security and privacy concerns of performing an evaluation and management service by telephone and the availability of in person appointments. I also discussed with the patient that there may be a patient responsible charge related to this service. The patient expressed understanding and agreed to proceed.   I discussed the  assessment and treatment plan with the patient. The patient was provided an opportunity to ask questions and all were answered. The patient agreed with the plan and demonstrated an understanding of the instructions.   The patient was advised to call back or seek an in-person evaluation if the symptoms worsen or if the condition fails to improve as anticipated.  I provided 16 minutes of non-face-to-face time during this encounter.  Janene Harvey. Biagio Borg Avs printed and mailed

## 2021-12-18 NOTE — Progress Notes (Signed)
This service is provided via telemedicine ? ?No vital signs collected/recorded due to the encounter was a telemedicine visit.  ? ?Location of patient (ex: home, work):  Home ? ?Patient consents to a telephone visit:  Yes, see encounter dated 09/16/2021 ? ?Location of the provider (ex: office, home):  [place] ? ?Name of any referring provider:  N/A ? ?Names of all persons participating in the telemedicine service and their role in the encounter:  Sherrie Mustache, Nurse Practitioner, Carroll Kinds, CMA, and patient.  ? ?Time spent on call:  7 minutes with medical assistant ? ?

## 2021-12-23 ENCOUNTER — Telehealth: Payer: Self-pay

## 2021-12-23 ENCOUNTER — Encounter: Payer: Self-pay | Admitting: Dermatology

## 2021-12-23 DIAGNOSIS — C441122 Basal cell carcinoma of skin of right lower eyelid, including canthus: Secondary | ICD-10-CM | POA: Diagnosis not present

## 2021-12-23 NOTE — Telephone Encounter (Signed)
-----   Message from Ralene Bathe, MD sent at 12/22/2021  3:16 PM EDT ----- ?Diagnosis ?Skin , left epigastric ?SUPERFICIAL BASAL CELL CARCINOMA, CLOSE TO MARGIN ? ?Cancer - BCC ?Superficial ?Already treated ?Recheck next visit ?

## 2021-12-23 NOTE — Telephone Encounter (Signed)
Biopsy results discussed with pt  

## 2022-01-03 DIAGNOSIS — G4733 Obstructive sleep apnea (adult) (pediatric): Secondary | ICD-10-CM | POA: Diagnosis not present

## 2022-01-22 ENCOUNTER — Other Ambulatory Visit: Payer: Self-pay | Admitting: Family

## 2022-01-22 DIAGNOSIS — F339 Major depressive disorder, recurrent, unspecified: Secondary | ICD-10-CM

## 2022-01-22 MED ORDER — SERTRALINE HCL 50 MG PO TABS: 50.0000 mg | ORAL_TABLET | Freq: Every day | ORAL | 5 refills | Status: DC

## 2022-01-22 NOTE — Telephone Encounter (Signed)
[facility] requested refill.  ?Pended Rx and sent to [facility] for approval due to [place].  ?

## 2022-02-02 DIAGNOSIS — G4733 Obstructive sleep apnea (adult) (pediatric): Secondary | ICD-10-CM | POA: Diagnosis not present

## 2022-02-16 ENCOUNTER — Other Ambulatory Visit: Payer: Self-pay | Admitting: *Deleted

## 2022-02-16 DIAGNOSIS — E782 Mixed hyperlipidemia: Secondary | ICD-10-CM

## 2022-02-16 MED ORDER — TRAZODONE HCL 50 MG PO TABS: 50.0000 mg | ORAL_TABLET | Freq: Every evening | ORAL | 1 refills | Status: DC | PRN

## 2022-02-16 MED ORDER — HYDROCHLOROTHIAZIDE 25 MG PO TABS: ORAL_TABLET | ORAL | 1 refills | Status: DC

## 2022-02-16 MED ORDER — ISOSORBIDE MONONITRATE ER 30 MG PO TB24
ORAL_TABLET | ORAL | 1 refills | Status: DC
Start: 2022-02-16 — End: 2022-07-22

## 2022-02-16 MED ORDER — SIMVASTATIN 40 MG PO TABS: 40.0000 mg | ORAL_TABLET | Freq: Every day | ORAL | 1 refills | Status: DC

## 2022-02-16 NOTE — Telephone Encounter (Signed)
Pharmacy requested refill. Pended Rx and sent to [name] for approval due to HIGH ALERT Warning.  

## 2022-02-19 ENCOUNTER — Other Ambulatory Visit: Payer: Self-pay

## 2022-02-19 MED ORDER — METOPROLOL SUCCINATE ER 100 MG PO TB24: 100.0000 mg | ORAL_TABLET | Freq: Every day | ORAL | 1 refills | Status: DC

## 2022-02-19 NOTE — Telephone Encounter (Signed)
Hig warning came up when trying to refill medications. Medication pended and sent to Marlowe Sax, NP (covering provider) for approval.

## 2022-03-13 ENCOUNTER — Telehealth: Payer: Self-pay | Admitting: Cardiology

## 2022-03-13 MED ORDER — NITROGLYCERIN 0.4 MG SL SUBL: 0.4000 mg | SUBLINGUAL_TABLET | SUBLINGUAL | 1 refills | Status: DC | PRN

## 2022-03-13 NOTE — Telephone Encounter (Signed)
*  STAT* If patient is at the pharmacy, call can be transferred to refill team.   1. Which medications need to be refilled? (please list name of each medication and dose if known)  nitroGLYCERIN (NITROSTAT) 0.4 MG SL tablet  2. Which pharmacy/location (including street and city if local pharmacy) is medication to be sent to?  [facility] [address] - [place], [place] [address]  3. Do they need a 30 day or 90 day supply? [place]

## 2022-03-13 NOTE — Telephone Encounter (Signed)
Requested Prescriptions   Signed Prescriptions Disp Refills  . nitroGLYCERIN (NITROSTAT) 0.4 MG SL tablet 25 tablet 1    Sig: Place 1 tablet (0.4 mg total) under the tongue every 5 (five) minutes as needed for chest pain.    Authorizing Provider: GOLLAN, TIMOTHY J    Ordering User: Joene Gelder C    

## 2022-03-27 ENCOUNTER — Other Ambulatory Visit: Payer: Self-pay | Admitting: *Deleted

## 2022-03-27 DIAGNOSIS — E782 Mixed hyperlipidemia: Secondary | ICD-10-CM

## 2022-03-27 MED ORDER — SIMVASTATIN 40 MG PO TABS: 40.0000 mg | ORAL_TABLET | Freq: Every day | ORAL | 1 refills | Status: DC

## 2022-05-01 ENCOUNTER — Other Ambulatory Visit: Payer: Self-pay | Admitting: *Deleted

## 2022-05-01 DIAGNOSIS — I10 Essential (primary) hypertension: Secondary | ICD-10-CM

## 2022-05-01 MED ORDER — LOSARTAN POTASSIUM 25 MG PO TABS: 25.0000 mg | ORAL_TABLET | Freq: Every day | ORAL | 1 refills | Status: DC

## 2022-05-01 NOTE — Telephone Encounter (Signed)
Pharmacy requested refill. Pended Rx and sent to [name] for approval due to HIGH ALERT Warning.  

## 2022-05-04 ENCOUNTER — Other Ambulatory Visit: Payer: Self-pay | Admitting: *Deleted

## 2022-05-04 DIAGNOSIS — F339 Major depressive disorder, recurrent, unspecified: Secondary | ICD-10-CM

## 2022-05-04 MED ORDER — SERTRALINE HCL 50 MG PO TABS: 50.0000 mg | ORAL_TABLET | Freq: Every day | ORAL | 1 refills | Status: DC

## 2022-05-04 NOTE — Telephone Encounter (Signed)
Pharmacy requested refill. Pended Rx and sent to [name] for approval due to HIGH ALERT Warning.  

## 2022-05-15 ENCOUNTER — Ambulatory Visit: Payer: Medicare HMO | Admitting: Nurse Practitioner

## 2022-05-21 ENCOUNTER — Ambulatory Visit: Payer: Medicare HMO | Admitting: Dermatology

## 2022-05-21 NOTE — Progress Notes (Signed)
Careteam: Patient Care Team: [name], NP as PCP - General (Geriatric Medicine) Martinique, Peter M, MD as PCP - Cardiology (Cardiology) [name], [place] as Referring Physician (Dermatology) [name], MD as Referring Physician (Otolaryngology)  PLACE OF SERVICE:  [place] Directive information    Allergies  Allergen Reactions   Pollen Extract Other (See Comments)    Stuffy nose/itchy throat (Seasonal allergies)    Chief Complaint  Patient presents with   Medical Management of Chronic Issues    6 month follow-up and discuss need for flu vaccine (out of stock) and additional covid boosters or post pone if patient refuses. Patient denies receiving any vaccines since last visit.      HPI: Patient is a 76 y.o. male for routine follow up.   He reports he is doing well. Zoloft helping with emotions.  His sister passed away recently but he is coping   Continues to go to dermatology due to squamous cell removal.   Reports worsening shortness of breath, reports he has been very inactive for the last 7 months. No palpitations or chest pains.  He had his heart attack in 2018.  Reports he was on lipitor in the past but zocor has controlled LDL    Reports he gets phelm in his throat and then cough, but does not get up mucous. He has been having some hoarness, comes and goes.  No sore throat.  Reports he has indigestion, takes a tums.   Has not been eating properly, has gained weight.   Was having pain to right hip and bursa but this has improved.  Continues to have the low back pain to the right  Chiropractor did lumbar roll.  Having some sciatic pain This has been ongoing for years.  When its flared he wants to get something done but he will stretch and exercises and it will improve.  He takes tylenol to help with pain.  Review of Systems:  Review of Systems  Constitutional:  Negative for chills, fever and weight loss.  HENT:  Negative for  tinnitus.   Respiratory:  Positive for cough. Negative for sputum production and shortness of breath.   Cardiovascular:  Negative for chest pain, palpitations and leg swelling.  Gastrointestinal:  Positive for heartburn. Negative for abdominal pain, constipation and diarrhea.  Genitourinary:  Negative for dysuria, frequency and urgency.  Musculoskeletal:  Positive for back pain. Negative for falls, joint pain and myalgias.  Skin: Negative.   Neurological:  Negative for dizziness and headaches.  Psychiatric/Behavioral:  Negative for depression and memory loss. The patient does not have insomnia.    Past Medical History:  Diagnosis Date   Actinic keratosis 02/10/2021   left ear mid antihelix   Alcoholism ([place])    per pt   Allergy    Basal cell carcinoma 05/13/2020   left prox medial pretibial    Basal cell carcinoma 08/26/2020   below the left medial knee - tx with ED&C    Basal cell carcinoma 02/10/2021   Right lower eyelid/lat canthus Moh's 08/07/2021   Basal cell carcinoma 12/17/2021   left epigastric, superficial- EDC   Coronary artery disease    a. 06/2017 MV: EF 45%, large inf and apical defect w/o ischemia;  b. 06/2017 Cath: LM nl, LAD 30ost, LCX 50p, OM1/2 ok, RCA 100p w/ L->R collats. EF 35%.   Depression with anxiety    Gastric ulcer, acute with hemorrhage 2001   Dr. Sharlet Salina, [facility]  H/O congenital atrial septal defect (ASD) repair    Heart murmur    ASD repair    Helicobacter pylori gastritis    Heme positive stool    History of gout    History of MRSA infection    History of peptic ulcer disease    had bleeding ulcers requiring transfusion   History of PSVT (paroxysmal supraventricular tachycardia)    Hyperlipidemia    Hypertension    Ischemic cardiomyopathy    a. 06/2017 LV gram: EF 35%.   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   eft chest parasternal sup, txt'd EDC   Squamous cell carcinoma of skin 01/29/2020   right clavicle   Squamous cell carcinoma of skin 08/13/2021   R lat pectoral - EDC 09/24/2021   Squamous cell carcinoma of skin 11/20/2021   L chest parasternal sup - tx with ED&C   Squamous cell carcinoma of skin 11/20/2021   L chest parasternal inf - tx with ED&C 12/17/21   Squamous cell carcinoma of skin 11/20/2021   R Xyphoid - tx with ED&C 12/17/21   Tobacco abuse    Past Surgical History:  Procedure Laterality Date   ASD REPAIR, OSTIUM PRIMUM  1966   basal cell carcinoma removal     COLONOSCOPY     EAR BIOPSY Left 2013   Dr.Cook--squamous cell ca ([facility])   LEFT HEART CATH AND CORONARY ANGIOGRAPHY Left 06/30/2017   Procedure: LEFT HEART CATH AND CORONARY ANGIOGRAPHY;  Surgeon: Gollan, Timothy J, MD;  Location: [facility] INVASIVE CV LAB;  Service: Cardiovascular;  Laterality: Left;   MOHS SURGERY     ear   open heart surgery  1966   Dr. Young ([facility]); was PFO repair--has been normal in f/u   PALATE / UVULA BIOPSY / EXCISION  2000   Dr.Sprenhe ([facility])   TONSILLECTOMY AND ADENOIDECTOMY  1959   UVULECTOMY     Social History:   reports that he quit smoking about 6 years ago. His smoking use included cigarettes. He has never used smokeless tobacco. He reports that he does not drink alcohol and does not use drugs.  Family History  Problem Relation Age of Onset   Failure to thrive Mother    Arthritis Mother    Other Mother        Colectomy   Brain cancer Father    Hypertension Sister    Brain cancer Sister    Other Sister        Blastoma   Stomach cancer Maternal Grandmother    Cancer Paternal Grandfather        type unknown   Liver cancer Paternal Uncle    Other Other        mother's siblings died of cancer  ( abdominal)   Colon cancer Neg Hx    Colon polyps Neg Hx    Esophageal cancer Neg Hx    Rectal cancer Neg Hx     Medications: Patient's Medications  New Prescriptions   No medications on file  Previous Medications   ASPIRIN EC 81 MG TABLET    Take 81 mg by mouth daily.     BUPROPION (WELLBUTRIN XL) 300 MG 24 HR TABLET    TAKE ONE TABLET BY MOUTH ONE TIME DAILY   COLCHICINE 0.6 MG TABLET    Take 0.6 mg by mouth as needed.   EZETIMIBE (ZETIA) 10 MG TABLET    Take 1 tablet (10 mg total) by mouth daily.   HYDROCHLOROTHIAZIDE (HYDRODIURIL) 25 MG TABLET    TAKE 1 TABLET (25 MG TOTAL) BY MOUTH DAILY AS NEEDED.   ISOSORBIDE MONONITRATE (IMDUR) 30 MG 24 HR TABLET    TAKE 1/2 OF A TABLET (15 MG TOTAL) BY MOUTH DAILY   LOSARTAN (COZAAR) 25 MG TABLET    Take 1 tablet (25 mg total) by mouth daily.   METOPROLOL SUCCINATE (TOPROL-XL) 100 MG 24 HR TABLET    Take 1 tablet (100 mg total) by mouth daily. Take with or immediately following a meal.   NITROGLYCERIN (NITROSTAT) 0.4 MG SL TABLET    Place 1 tablet (0.4 mg total) under the tongue every 5 (five) minutes as needed for chest pain.   PROPRANOLOL (INDERAL) 20 MG TABLET    TAKE 1 TABLET (20 MG TOTAL) BY MOUTH AS NEEDED (AS NEEDED FOR FAST HEART RATES).   SENNA (SENOKOT) 8.6 MG TABLET    Take 2 tablets by mouth at bedtime.   SERTRALINE (ZOLOFT) 50 MG TABLET    Take 1 tablet (50 mg total) by mouth daily.   SIMVASTATIN (ZOCOR) 40 MG TABLET    Take 1 tablet (40 mg total) by mouth daily.   SPIRONOLACTONE (ALDACTONE) 25 MG TABLET    Take 1 tablet (25 mg total) by mouth daily.   TRAZODONE (DESYREL) 50 MG TABLET    Take 1 tablet (50 mg total) by mouth at bedtime as needed. for insomnia  Modified Medications   No medications on file  Discontinued Medications   No medications on file    Physical Exam:  Vitals:   05/22/22 0945  BP: 110/62  Pulse: 62  Temp: 97.8 F (36.6 C)  SpO2: 98%  Weight: 218 lb (98.9 kg)  Height: 6' 1" (1.854 m)   Body mass  index is 28.76 kg/m. Wt Readings from Last 3 Encounters:  05/22/22 218 lb (98.9 kg)  11/10/21 211 lb 2 oz (95.8 kg)  11/07/21 209 lb (94.8 kg)    Physical Exam Constitutional:      General: He is not in acute distress.    Appearance: He is well-developed. He is not diaphoretic.  HENT:     Head: Normocephalic and atraumatic.     Right Ear: External ear normal.     Left Ear: External ear normal.     Mouth/Throat:     Pharynx: No oropharyngeal exudate.  Eyes:     Conjunctiva/sclera: Conjunctivae normal.     Pupils: Pupils are equal, round, and reactive to light.  Cardiovascular:     Rate and Rhythm: Normal rate and regular rhythm.     Heart sounds: Normal heart sounds.  Pulmonary:     Effort: Pulmonary effort is normal.     Breath sounds: Normal breath sounds.  Abdominal:     General: Bowel sounds are normal.     Palpations: Abdomen is soft.  Musculoskeletal:        General: Tenderness (left paraspinal muscle) present.     Cervical back: Normal range of motion and neck supple.     Right lower leg: No edema.     Left lower leg: No edema.  Skin:    General: Skin is warm  and dry.  Neurological:     Mental Status: He is alert and oriented to person, place, and time.    Labs reviewed: Basic Metabolic Panel: Recent Labs    11/07/21 0000  NA 135  K 4.5  CL 101  CO2 26  GLUCOSE 104*  BUN 24  CREATININE 1.64*  CALCIUM 9.8   Liver Function Tests: Recent Labs    11/07/21 0000  AST 19  ALT 15  BILITOT 0.7  PROT 6.4   No results for input(s): "LIPASE", "AMYLASE" in the last 8760 hours. No results for input(s): "AMMONIA" in the last 8760 hours. CBC: Recent Labs    11/07/21 0000  WBC 8.7  NEUTROABS 5,977  HGB 14.0  HCT 41.3  MCV 92.8  PLT 312   Lipid Panel: Recent Labs    11/07/21 0000  CHOL 122  HDL 47  LDLCALC 52  TRIG 150*  CHOLHDL 2.6   TSH: No results for input(s): "TSH" in the last 8760 hours. A1C: Lab Results  Component Value Date    HGBA1C 5.8 (H) 11/07/2021     Assessment/Plan 1. Essential hypertension, benign -Blood pressure well controlled, goal bp <140/90 Continue current medications and dietary modifications follow metabolic panel - CBC with Differential/Platelet - CMP with eGFR([facility])  2. Atherosclerosis of native coronary artery of native heart with stable angina pectoris (HCC) -stable, continues on asa and statin. - nitroGLYCERIN (NITROSTAT) 0.4 MG SL tablet; Place 1 tablet (0.4 mg total) under the tongue every 5 (five) minutes as needed for chest pain.  Dispense: 25 tablet; Refill: 1  3. Obstructive sleep apnea on CPAP Stable, continues on cpap  4. Mixed hyperlipidemia -cholesterol at goal. Continue current regimen. - Lipid panel  5. Depression, recurrent (HCC) -stable at this time, doing well on zoloft with wellbutrin   6. Hyperglycemia -continue dietary modifications.   7. Chronic idiopathic constipation Stable on senna  8. Hoarse -?due to GERD, he will make dietary modifications and start protonix, if persist will send to ENT for ongoing evaluation - pantoprazole (PROTONIX) 40 MG tablet; Take 1 tablet (40 mg total) by mouth daily.  Dispense: 30 tablet; Refill: 3  9. Gastroesophageal reflux disease without esophagitis -worsening indigestion.  - pantoprazole (PROTONIX) 40 MG tablet; Take 1 tablet (40 mg total) by mouth daily.  Dispense: 30 tablet; Refill: 3  10. Chronic right-sided low back pain with right-sided sciatica -ongoing chronic low back pain, reports he will have exacerbation in pain at times.  -has completed PT in the past and did not benefit at that time - DG Lumbar Spine Complete; Future for further evaluation   Return in about 3 months (around 08/22/2022) for GERD/hoarness. [name] K. Eubanks, AGNP  [facility] [phone]  

## 2022-05-22 ENCOUNTER — Encounter: Payer: Self-pay | Admitting: Nurse Practitioner

## 2022-05-22 ENCOUNTER — Ambulatory Visit (INDEPENDENT_AMBULATORY_CARE_PROVIDER_SITE_OTHER): Payer: Medicare HMO | Admitting: Nurse Practitioner

## 2022-05-22 VITALS — BP 110/62 | HR 62 | Temp 97.8°F | Ht 73.0 in | Wt 218.0 lb

## 2022-05-22 DIAGNOSIS — E782 Mixed hyperlipidemia: Secondary | ICD-10-CM

## 2022-05-22 DIAGNOSIS — K5904 Chronic idiopathic constipation: Secondary | ICD-10-CM

## 2022-05-22 DIAGNOSIS — I10 Essential (primary) hypertension: Secondary | ICD-10-CM | POA: Diagnosis not present

## 2022-05-22 DIAGNOSIS — R739 Hyperglycemia, unspecified: Secondary | ICD-10-CM

## 2022-05-22 DIAGNOSIS — Z9989 Dependence on other enabling machines and devices: Secondary | ICD-10-CM

## 2022-05-22 DIAGNOSIS — M5441 Lumbago with sciatica, right side: Secondary | ICD-10-CM | POA: Diagnosis not present

## 2022-05-22 DIAGNOSIS — F339 Major depressive disorder, recurrent, unspecified: Secondary | ICD-10-CM

## 2022-05-22 DIAGNOSIS — G4733 Obstructive sleep apnea (adult) (pediatric): Secondary | ICD-10-CM

## 2022-05-22 DIAGNOSIS — I25118 Atherosclerotic heart disease of native coronary artery with other forms of angina pectoris: Secondary | ICD-10-CM

## 2022-05-22 DIAGNOSIS — G8929 Other chronic pain: Secondary | ICD-10-CM

## 2022-05-22 DIAGNOSIS — R69 Illness, unspecified: Secondary | ICD-10-CM | POA: Diagnosis not present

## 2022-05-22 DIAGNOSIS — R490 Dysphonia: Secondary | ICD-10-CM | POA: Diagnosis not present

## 2022-05-22 DIAGNOSIS — K219 Gastro-esophageal reflux disease without esophagitis: Secondary | ICD-10-CM | POA: Diagnosis not present

## 2022-05-22 MED ORDER — PANTOPRAZOLE SODIUM 40 MG PO TBEC: 40.0000 mg | DELAYED_RELEASE_TABLET | Freq: Every day | ORAL | 3 refills | Status: DC

## 2022-05-22 MED ORDER — NITROGLYCERIN 0.4 MG SL SUBL: 0.4000 mg | SUBLINGUAL_TABLET | SUBLINGUAL | 1 refills | Status: DC | PRN

## 2022-05-23 LAB — COMPLETE METABOLIC PANEL WITH GFR
AG Ratio: 1.9 (calc) (ref 1.0–2.5)
ALT: 15 U/L (ref 9–46)
AST: 18 U/L (ref 10–35)
Albumin: 4.1 g/dL (ref 3.6–5.1)
Alkaline phosphatase (APISO): 49 U/L (ref 35–144)
BUN/Creatinine Ratio: 13 (calc) (ref 6–22)
BUN: 19 mg/dL (ref 7–25)
CO2: 26 mmol/L (ref 20–32)
Calcium: 9.4 mg/dL (ref 8.6–10.3)
Chloride: 101 mmol/L (ref 98–110)
Creat: 1.49 mg/dL — ABNORMAL HIGH (ref 0.70–1.28)
Globulin: 2.2 g/dL (calc) (ref 1.9–3.7)
Glucose, Bld: 106 mg/dL — ABNORMAL HIGH (ref 65–99)
Potassium: 4.6 mmol/L (ref 3.5–5.3)
Sodium: 136 mmol/L (ref 135–146)
Total Bilirubin: 0.6 mg/dL (ref 0.2–1.2)
Total Protein: 6.3 g/dL (ref 6.1–8.1)
eGFR: 48 mL/min/{1.73_m2} — ABNORMAL LOW (ref >=60)

## 2022-05-23 LAB — CBC WITH DIFFERENTIAL/PLATELET
Absolute Monocytes: 644 cells/uL (ref 200–950)
Basophils Absolute: 67 cells/uL (ref 0–200)
Basophils Relative: 0.9 %
Eosinophils Absolute: 289 cells/uL (ref 15–500)
Eosinophils Relative: 3.9 %
HCT: 40.1 % (ref 38.5–50.0)
Hemoglobin: 13.4 g/dL (ref 13.2–17.1)
Lymphs Abs: 1347 cells/uL (ref 850–3900)
MCH: 31.3 pg (ref 27.0–33.0)
MCHC: 33.4 g/dL (ref 32.0–36.0)
MCV: 93.7 fL (ref 80.0–100.0)
MPV: 9.4 fL (ref 7.5–12.5)
Monocytes Relative: 8.7 %
Neutro Abs: 5054 cells/uL (ref 1500–7800)
Neutrophils Relative %: 68.3 %
Platelets: 289 10*3/uL (ref 140–400)
RBC: 4.28 10*6/uL (ref 4.20–5.80)
RDW: 13 % (ref 11.0–15.0)
Total Lymphocyte: 18.2 %
WBC: 7.4 10*3/uL (ref 3.8–10.8)

## 2022-05-23 LAB — LIPID PANEL
Cholesterol: 124 mg/dL (ref <200)
HDL: 50 mg/dL (ref >=40)
LDL Cholesterol (Calc): 51 mg/dL (calc)
Non-HDL Cholesterol (Calc): 74 mg/dL (calc) (ref <130)
Total CHOL/HDL Ratio: 2.5 (calc) (ref <5.0)
Triglycerides: 146 mg/dL (ref <150)

## 2022-06-03 ENCOUNTER — Encounter: Payer: Self-pay | Admitting: Dermatology

## 2022-06-03 ENCOUNTER — Ambulatory Visit (INDEPENDENT_AMBULATORY_CARE_PROVIDER_SITE_OTHER): Payer: Medicare HMO | Admitting: Dermatology

## 2022-06-03 DIAGNOSIS — Z1283 Encounter for screening for malignant neoplasm of skin: Secondary | ICD-10-CM | POA: Diagnosis not present

## 2022-06-03 DIAGNOSIS — Z85828 Personal history of other malignant neoplasm of skin: Secondary | ICD-10-CM | POA: Diagnosis not present

## 2022-06-03 DIAGNOSIS — L570 Actinic keratosis: Secondary | ICD-10-CM | POA: Diagnosis not present

## 2022-06-03 DIAGNOSIS — B353 Tinea pedis: Secondary | ICD-10-CM | POA: Diagnosis not present

## 2022-06-03 DIAGNOSIS — Z79899 Other long term (current) drug therapy: Secondary | ICD-10-CM | POA: Diagnosis not present

## 2022-06-03 DIAGNOSIS — D229 Melanocytic nevi, unspecified: Secondary | ICD-10-CM

## 2022-06-03 DIAGNOSIS — L821 Other seborrheic keratosis: Secondary | ICD-10-CM

## 2022-06-03 DIAGNOSIS — D1800 Hemangioma unspecified site: Secondary | ICD-10-CM

## 2022-06-03 DIAGNOSIS — L820 Inflamed seborrheic keratosis: Secondary | ICD-10-CM

## 2022-06-03 DIAGNOSIS — L578 Other skin changes due to chronic exposure to nonionizing radiation: Secondary | ICD-10-CM

## 2022-06-03 DIAGNOSIS — C44619 Basal cell carcinoma of skin of left upper limb, including shoulder: Secondary | ICD-10-CM | POA: Diagnosis not present

## 2022-06-03 DIAGNOSIS — L814 Other melanin hyperpigmentation: Secondary | ICD-10-CM

## 2022-06-03 DIAGNOSIS — D492 Neoplasm of unspecified behavior of bone, soft tissue, and skin: Secondary | ICD-10-CM

## 2022-06-03 NOTE — Progress Notes (Signed)
Follow-Up Visit   Subjective  Mitchell Stephens. is a 76 y.o. male who presents for the following: Annual Exam (Mole check ). Hx of BCC, hx of SCC.  The patient presents for Total-Body Skin Exam (TBSE) for skin cancer screening and mole check.  The patient has spots, moles and lesions to be evaluated, some may be new or changing and the patient has concerns that these could be cancer.   The following portions of the chart were reviewed this encounter and updated as appropriate:   Tobacco  Allergies  Meds  Problems  Med Hx  Surg Hx  Fam Hx     Review of Systems:  No other skin or systemic complaints except as noted in HPI or Assessment and Plan.  Objective  Well appearing patient in no apparent distress; mood and affect are within normal limits.  A full examination was performed including scalp, head, eyes, ears, nose, lips, neck, chest, axillae, abdomen, back, buttocks, bilateral upper extremities, bilateral lower extremities, hands, feet, fingers, toes, fingernails, and toenails. All findings within normal limits unless otherwise noted below.  left lateral elbow 1.0 x 0.5 cm pink indurated patch        face,ears x 17 (17) Erythematous thin papules/macules with gritty scale.   neck Stuck-on, waxy, tan-brown papules --Discussed benign etiology and prognosis.   feet Scaling and maceration web spaces and over distal and lateral soles.    Assessment & Plan  Neoplasm of skin left lateral elbow Skin / nail biopsy Type of biopsy: tangential   Informed consent: discussed and consent obtained   Patient was prepped and draped in usual sterile fashion: area prepped with alochol. 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 They are chronic with expected duration over 1 year. A portion of actinic keratoses will progress to squamous cell carcinoma of the skin. It is not possible to reliably predict which spots will progress to skin cancer and so treatment is recommended to prevent development of skin cancer.  Recommend daily broad spectrum sunscreen SPF 30+ to sun-exposed areas, reapply every 2 hours as needed.  Recommend staying in the shade or wearing long sleeves, sun glasses (UVA+UVB protection) and wide brim hats (4-inch brim around the entire circumference of the hat). Call for new or changing lesions.   Destruction of lesion - face,ears x 17 Complexity: simple   Destruction method: cryotherapy   Informed consent: discussed and consent obtained   Timeout:  patient name, date of birth, surgical site, and procedure verified Lesion destroyed using liquid nitrogen: Yes   Region frozen until ice ball extended beyond lesion: Yes   Outcome: patient tolerated procedure well with no complications   Post-procedure details: wound care instructions given    Inflamed seborrheic keratosis neck Symptomatic, irritating, patient would like treated.  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 Condition is symptomatic / bothersome to patient. Not to goal.   Labs reviewed from date 05/22/2022 WNL Start Terbinafine 250 mg take 1 tablet daily   Terbinafine Counseling Terbinafine is an anti-fungal medicine that can be applied to the skin (over the counter) or taken by mouth (prescription) to treat fungal infections. The pill version is often used to treat fungal infections of the nails or scalp. While most people do not have any side effects from taking terbinafine pills, some possible side effects of the medicine can include taste changes, headache, loss of smell, vision changes, nausea, vomiting, or diarrhea.   Rare side effects can include irritation of the liver, allergic reaction, or decrease in blood counts (which may show up as not feeling well or developing an infection). If you are concerned about any of these side effects, please stop the medicine and call your doctor, or in the case of an emergency such as feeling very unwell, seek immediate medical care.    Lentigines - Scattered tan macules - Due to sun exposure - Benign-appearing, observe - Recommend daily broad spectrum sunscreen SPF 30+ to sun-exposed areas, reapply every 2 hours as needed. - Call for any changes  Seborrheic Keratoses - Stuck-on, waxy, tan-brown papules and/or plaques  - Benign-appearing - Discussed benign etiology and prognosis. - Observe - Call for any changes  Melanocytic Nevi - Tan-brown and/or pink-flesh-colored symmetric macules and papules - Benign appearing on exam today - Observation - Call clinic for new or changing moles - Recommend daily use of broad spectrum spf 30+ sunscreen to sun-exposed areas.   Hemangiomas - Red papules - Discussed benign nature - Observe - Call for any changes  Actinic Damage -  Chronic condition, secondary to cumulative UV/sun exposure - diffuse scaly erythematous macules with underlying dyspigmentation - Recommend daily broad spectrum sunscreen SPF 30+ to sun-exposed areas, reapply every 2 hours as needed.  - Staying in the shade or wearing long sleeves, sun glasses (UVA+UVB protection) and wide brim hats (4-inch brim around the entire circumference of the hat) are also recommended for sun protection.  - Call for new or changing lesions.  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  - Call if any new or changing lesions are noted between office visits   Skin cancer screening performed today.   Return in about 6 months (around 12/03/2022) for TBSE, hx of BCC, hx of SCC .  Marta Lamas, CMA, am acting as scribe for Sarina Ser, MD .  Documentation: I have reviewed the above documentation for accuracy and  completeness, and I agree with the above.  Sarina Ser, MD

## 2022-06-03 NOTE — Patient Instructions (Addendum)
Wound Care Instructions  Cleanse wound gently with soap and water once a day then pat dry with clean gauze. Apply a thin coat of Petrolatum (petroleum jelly, "Vaseline") over the wound (unless you have an allergy to this). We recommend that you use a new, sterile tube of Vaseline. Do not pick or remove scabs. Do not remove the yellow or white "healing tissue" from the base of the wound.  Cover the wound with fresh, clean, nonstick gauze and secure with paper tape. You may use Band-Aids in place of gauze and tape if the wound is small enough, but would recommend trimming much of the tape off as there is often too much. Sometimes Band-Aids can irritate the skin.  You should call the office for your biopsy report after 1 week if you have not already been contacted.  If you experience any problems, such as abnormal amounts of bleeding, swelling, significant bruising, significant pain, or evidence of infection, please call the office immediately.  FOR ADULT SURGERY PATIENTS: If you need something for pain relief you may take 1 extra strength Tylenol (acetaminophen) AND 2 Ibuprofen (200mg each) together every 4 hours as needed for pain. (do not take these if you are allergic to them or if you have a reason you should not take them.) Typically, you may only need pain medication for 1 to 3 days.     Cryotherapy Aftercare  Wash gently with soap and water everyday.   Apply Vaseline and Band-Aid daily until healed.    Due to recent changes in healthcare laws, you may see results of your pathology and/or laboratory studies on MyChart before the doctors have had a chance to review them. We understand that in some cases there may be results that are confusing or concerning to you. Please understand that not all results are received at the same time and often the doctors may need to interpret multiple results in order to provide you with the best plan of care or course of treatment. Therefore, we ask that you  please give us 2 business days to thoroughly review all your results before contacting the office for clarification. Should we see a critical lab result, you will be contacted sooner.   If You Need Anything After Your Visit  If you have any questions or concerns for your doctor, please call our main line at [phone] and press option 4 to reach your doctor's medical assistant. If no one answers, please leave a voicemail as directed and we will return your call as soon as possible. Messages left after 4 pm will be answered the following business day.   You may also send us a message via MyChart. We typically respond to MyChart messages within 1-2 business days.  For prescription refills, please ask your pharmacy to contact our office. Our fax number is [phone].  If you have an urgent issue when the clinic is closed that cannot wait until the next business day, you can page your doctor at the number below.    Please note that while we do our best to be available for urgent issues outside of office hours, we are not available 24/7.   If you have an urgent issue and are unable to reach us, you may choose to seek medical care at your doctor's office, retail clinic, urgent care center, or emergency room.  If you have a medical emergency, please immediately call 911 or go to the emergency department.  Pager Numbers  - Dr. Kowalski: [phone]  -   Dr. Moye: [phone]  - Dr. Stewart: [phone]  In the event of inclement weather, please call our main line at [phone] for an update on the status of any delays or closures.  Dermatology Medication Tips: Please keep the boxes that topical medications come in in order to help keep track of the instructions about where and how to use these. Pharmacies typically print the medication instructions only on the boxes and not directly on the medication tubes.   If your medication is too expensive, please contact our office at  [phone] option 4 or send us a message through MyChart.   We are unable to tell what your co-pay for medications will be in advance as this is different depending on your insurance coverage. However, we may be able to find a substitute medication at lower cost or fill out paperwork to get insurance to cover a needed medication.   If a prior authorization is required to get your medication covered by your insurance company, please allow us 1-2 business days to complete this process.  Drug prices often vary depending on where the prescription is filled and some pharmacies may offer cheaper prices.  The website www.goodrx.com contains coupons for medications through different pharmacies. The prices here do not account for what the cost may be with help from insurance (it may be cheaper with your insurance), but the website can give you the price if you did not use any insurance.  - You can print the associated coupon and take it with your prescription to the pharmacy.  - You may also stop by our office during regular business hours and pick up a GoodRx coupon card.  - If you need your prescription sent electronically to a different pharmacy, notify our office through [facility] MyChart or by phone at [phone] option 4.     Si Usted Necesita Algo Despus de Su Visita  Tambin puede enviarnos un mensaje a travs de MyChart. Por lo general respondemos a los mensajes de MyChart en el transcurso de 1 a 2 das hbiles.  Para renovar recetas, por favor pida a su farmacia que se ponga en contacto con nuestra oficina. Nuestro nmero de fax es el [phone].  Si tiene un asunto urgente cuando la clnica est cerrada y que no puede esperar hasta el siguiente da hbil, puede llamar/localizar a su doctor(a) al nmero que aparece a continuacin.   Por favor, tenga en cuenta que aunque hacemos todo lo posible para estar disponibles para asuntos urgentes fuera del horario de oficina, no estamos  disponibles las 24 horas del da, los 7 das de la semana.   Si tiene un problema urgente y no puede comunicarse con nosotros, puede optar por buscar atencin mdica  en el consultorio de su doctor(a), en una clnica privada, en un centro de atencin urgente o en una sala de emergencias.  Si tiene una emergencia mdica, por favor llame inmediatamente al 911 o vaya a la sala de emergencias.  Nmeros de bper  - Dr. Kowalski: [phone]  - Dra. Moye: [phone]  - Dra. Stewart: [phone]  En caso de inclemencias del tiempo, por favor llame a nuestra lnea principal al [phone] para una actualizacin sobre el estado de cualquier retraso o cierre.  Consejos para la medicacin en dermatologa: Por favor, guarde las cajas en las que vienen los medicamentos de uso tpico para ayudarle a seguir las instrucciones sobre dnde y cmo usarlos. Las farmacias generalmente imprimen las instrucciones del medicamento slo en las cajas y   no directamente en los tubos del medicamento.   Si su medicamento es muy caro, por favor, pngase en contacto con nuestra oficina llamando al [phone] y presione la opcin 4 o envenos un mensaje a travs de MyChart.   No podemos decirle cul ser su copago por los medicamentos por adelantado ya que esto es diferente dependiendo de la cobertura de su seguro. Sin embargo, es posible que podamos encontrar un medicamento sustituto a menor costo o llenar un formulario para que el seguro cubra el medicamento que se considera necesario.   Si se requiere una autorizacin previa para que su compaa de seguros cubra su medicamento, por favor permtanos de 1 a 2 das hbiles para completar este proceso.  Los precios de los medicamentos varan con frecuencia dependiendo del lugar de dnde se surte la receta y alguna farmacias pueden ofrecer precios ms baratos.  El sitio web www.goodrx.com tiene cupones para medicamentos de diferentes farmacias. Los precios aqu no  tienen en cuenta lo que podra costar con la ayuda del seguro (puede ser ms barato con su seguro), pero el sitio web puede darle el precio si no utiliz ningn seguro.  - Puede imprimir el cupn correspondiente y llevarlo con su receta a la farmacia.  - Tambin puede pasar por nuestra oficina durante el horario de atencin regular y recoger una tarjeta de cupones de GoodRx.  - Si necesita que su receta se enve electrnicamente a una farmacia diferente, informe a nuestra oficina a travs de MyChart de [facility] o por telfono llamando al [phone] y presione la opcin 4.  

## 2022-06-09 ENCOUNTER — Telehealth: Payer: Self-pay

## 2022-06-09 NOTE — Telephone Encounter (Signed)
LM on VM please return my call to discuss biopsy results  

## 2022-06-09 NOTE — Telephone Encounter (Signed)
-----   Message from Ralene Bathe, MD sent at 06/04/2022  5:46 PM EDT ----- Diagnosis Skin , left lateral elbow BASAL CELL CARCINOMA, NODULAR PATTERN, BASE INVOLVED  Cancer - BCC Schedule for treatment (EDC)

## 2022-06-10 ENCOUNTER — Other Ambulatory Visit: Payer: Self-pay

## 2022-06-10 ENCOUNTER — Telehealth: Payer: Self-pay

## 2022-06-10 MED ORDER — TERBINAFINE HCL 250 MG PO TABS: ORAL_TABLET | ORAL | 0 refills | Status: DC

## 2022-06-10 NOTE — Progress Notes (Signed)
Patient called after having trouble picking up medication from pharmacy. Terbinafine was never sent in. Escripted to [facility]

## 2022-06-10 NOTE — Telephone Encounter (Signed)
Patient notified of Bx results and scheduled for [facility]

## 2022-06-10 NOTE — Telephone Encounter (Signed)
-----   Message from Ralene Bathe, MD sent at 06/04/2022  5:46 PM EDT ----- Diagnosis Skin , left lateral elbow BASAL CELL CARCINOMA, NODULAR PATTERN, BASE INVOLVED  Cancer - BCC Schedule for treatment (EDC)

## 2022-06-24 ENCOUNTER — Ambulatory Visit (INDEPENDENT_AMBULATORY_CARE_PROVIDER_SITE_OTHER): Payer: Medicare HMO | Admitting: Dermatology

## 2022-06-24 ENCOUNTER — Encounter: Payer: Self-pay | Admitting: Dermatology

## 2022-06-24 DIAGNOSIS — C44619 Basal cell carcinoma of skin of left upper limb, including shoulder: Secondary | ICD-10-CM

## 2022-06-24 NOTE — Patient Instructions (Addendum)
Wound Care Instructions  Cleanse wound gently with soap and water once a day then pat dry with clean gauze. Apply a thin coat of Petrolatum (petroleum jelly, "Vaseline") over the wound (unless you have an allergy to this). We recommend that you use a new, sterile tube of Vaseline. Do not pick or remove scabs. Do not remove the yellow or white "healing tissue" from the base of the wound.  Cover the wound with fresh, clean, nonstick gauze and secure with paper tape. You may use Band-Aids in place of gauze and tape if the wound is small enough, but would recommend trimming much of the tape off as there is often too much. Sometimes Band-Aids can irritate the skin.  You should call the office for your biopsy report after 1 week if you have not already been contacted.  If you experience any problems, such as abnormal amounts of bleeding, swelling, significant bruising, significant pain, or evidence of infection, please call the office immediately.  FOR ADULT SURGERY PATIENTS: If you need something for pain relief you may take 1 extra strength Tylenol (acetaminophen) AND 2 Ibuprofen (200mg each) together every 4 hours as needed for pain. (do not take these if you are allergic to them or if you have a reason you should not take them.) Typically, you may only need pain medication for 1 to 3 days.     Due to recent changes in healthcare laws, you may see results of your pathology and/or laboratory studies on MyChart before the doctors have had a chance to review them. We understand that in some cases there may be results that are confusing or concerning to you. Please understand that not all results are received at the same time and often the doctors may need to interpret multiple results in order to provide you with the best plan of care or course of treatment. Therefore, we ask that you please give us 2 business days to thoroughly review all your results before contacting the office for clarification. Should  we see a critical lab result, you will be contacted sooner.   If You Need Anything After Your Visit  If you have any questions or concerns for your doctor, please call our main line at [phone] and press option 4 to reach your doctor's medical assistant. If no one answers, please leave a voicemail as directed and we will return your call as soon as possible. Messages left after 4 pm will be answered the following business day.   You may also send us a message via MyChart. We typically respond to MyChart messages within 1-2 business days.  For prescription refills, please ask your pharmacy to contact our office. Our fax number is [phone].  If you have an urgent issue when the clinic is closed that cannot wait until the next business day, you can page your doctor at the number below.    Please note that while we do our best to be available for urgent issues outside of office hours, we are not available 24/7.   If you have an urgent issue and are unable to reach us, you may choose to seek medical care at your doctor's office, retail clinic, urgent care center, or emergency room.  If you have a medical emergency, please immediately call 911 or go to the emergency department.  Pager Numbers  - Dr. Kowalski: [phone]  - Dr. Moye: [phone]  - Dr. Stewart: [phone]  In the event of inclement weather, please call our main line at   [phone] for an update on the status of any delays or closures.  Dermatology Medication Tips: Please keep the boxes that topical medications come in in order to help keep track of the instructions about where and how to use these. Pharmacies typically print the medication instructions only on the boxes and not directly on the medication tubes.   If your medication is too expensive, please contact our office at [phone] option 4 or send us a message through MyChart.   We are unable to tell what your co-pay for medications will be in  advance as this is different depending on your insurance coverage. However, we may be able to find a substitute medication at lower cost or fill out paperwork to get insurance to cover a needed medication.   If a prior authorization is required to get your medication covered by your insurance company, please allow us 1-2 business days to complete this process.  Drug prices often vary depending on where the prescription is filled and some pharmacies may offer cheaper prices.  The website www.goodrx.com contains coupons for medications through different pharmacies. The prices here do not account for what the cost may be with help from insurance (it may be cheaper with your insurance), but the website can give you the price if you did not use any insurance.  - You can print the associated coupon and take it with your prescription to the pharmacy.  - You may also stop by our office during regular business hours and pick up a GoodRx coupon card.  - If you need your prescription sent electronically to a different pharmacy, notify our office through [facility] MyChart or by phone at [phone] option 4.     Si Usted Necesita Algo Despus de Su Visita  Tambin puede enviarnos un mensaje a travs de MyChart. Por lo general respondemos a los mensajes de MyChart en el transcurso de 1 a 2 das hbiles.  Para renovar recetas, por favor pida a su farmacia que se ponga en contacto con nuestra oficina. Nuestro nmero de fax es el [phone].  Si tiene un asunto urgente cuando la clnica est cerrada y que no puede esperar hasta el siguiente da hbil, puede llamar/localizar a su doctor(a) al nmero que aparece a continuacin.   Por favor, tenga en cuenta que aunque hacemos todo lo posible para estar disponibles para asuntos urgentes fuera del horario de oficina, no estamos disponibles las 24 horas del da, los 7 das de la semana.   Si tiene un problema urgente y no puede comunicarse con nosotros, puede  optar por buscar atencin mdica  en el consultorio de su doctor(a), en una clnica privada, en un centro de atencin urgente o en una sala de emergencias.  Si tiene una emergencia mdica, por favor llame inmediatamente al 911 o vaya a la sala de emergencias.  Nmeros de bper  - Dr. Kowalski: [phone]  - Dra. Moye: [phone]  - Dra. Stewart: [phone]  En caso de inclemencias del tiempo, por favor llame a nuestra lnea principal al [phone] para una actualizacin sobre el estado de cualquier retraso o cierre.  Consejos para la medicacin en dermatologa: Por favor, guarde las cajas en las que vienen los medicamentos de uso tpico para ayudarle a seguir las instrucciones sobre dnde y cmo usarlos. Las farmacias generalmente imprimen las instrucciones del medicamento slo en las cajas y no directamente en los tubos del medicamento.   Si su medicamento es muy caro, por favor, pngase en contacto con   nuestra oficina llamando al [phone] y presione la opcin 4 o envenos un mensaje a travs de MyChart.   No podemos decirle cul ser su copago por los medicamentos por adelantado ya que esto es diferente dependiendo de la cobertura de su seguro. Sin embargo, es posible que podamos encontrar un medicamento sustituto a menor costo o llenar un formulario para que el seguro cubra el medicamento que se considera necesario.   Si se requiere una autorizacin previa para que su compaa de seguros cubra su medicamento, por favor permtanos de 1 a 2 das hbiles para completar este proceso.  Los precios de los medicamentos varan con frecuencia dependiendo del lugar de dnde se surte la receta y alguna farmacias pueden ofrecer precios ms baratos.  El sitio web www.goodrx.com tiene cupones para medicamentos de diferentes farmacias. Los precios aqu no tienen en cuenta lo que podra costar con la ayuda del seguro (puede ser ms barato con su seguro), pero el sitio web puede darle el  precio si no utiliz ningn seguro.  - Puede imprimir el cupn correspondiente y llevarlo con su receta a la farmacia.  - Tambin puede pasar por nuestra oficina durante el horario de atencin regular y recoger una tarjeta de cupones de GoodRx.  - Si necesita que su receta se enve electrnicamente a una farmacia diferente, informe a nuestra oficina a travs de MyChart de [place] o por telfono llamando al [phone] y presione la opcin 4.  

## 2022-06-24 NOTE — Progress Notes (Unsigned)
   Follow-Up Visit   Subjective  Mitchell Stephens. is a 76 y.o. male who presents for the following: Follow-up (Biopsy proven BCC of the left lateral elbow patient here to discuss treatment options. ). Discussed options today.  Patient will pursue EDC as discussed.  He declines other treatment options at this time.  The following portions of the chart were reviewed this encounter and updated as appropriate:   Tobacco  Allergies  Meds  Problems  Med Hx  Surg Hx  Fam Hx     Review of Systems:  No other skin or systemic complaints except as noted in HPI or Assessment and Plan.  Objective  Well appearing patient in no apparent distress; mood and affect are within normal limits.  A focused examination was performed including left elbow. Relevant physical exam findings are noted in the Assessment and Plan.  left lateral elbow Pink pearly papule or plaque with arborizing vessels.    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  Destruction of lesion  Destruction method: electrodesiccation and curettage   Informed consent: discussed and consent obtained   Timeout:  patient name, date of birth, surgical site, and procedure verified Anesthesia: the lesion was anesthetized in a standard fashion   Anesthetic:  1% lidocaine w/ epinephrine 1-100,000 buffered w/ 8.4% NaHCO3 Curettage performed in three different directions: Yes   Electrodesiccation performed over the curetted area: Yes   Curettage cycles:  3 Final wound size (cm):  2.1 Hemostasis achieved with:  electrodesiccation Outcome: patient tolerated procedure well with no complications   Post-procedure details: sterile dressing applied and wound care instructions given   Dressing type: petrolatum     Follow-up March 2024 as scheduled  I, Marye Round, CMA, am acting as scribe for Sarina Ser, MD .  Documentation: I have reviewed the above  documentation for accuracy and completeness, and I agree with the above.  Sarina Ser, MD

## 2022-06-25 ENCOUNTER — Encounter: Payer: Self-pay | Admitting: Dermatology

## 2022-07-08 ENCOUNTER — Other Ambulatory Visit: Payer: Self-pay | Admitting: *Deleted

## 2022-07-08 ENCOUNTER — Telehealth: Payer: Self-pay | Admitting: Cardiology

## 2022-07-08 MED ORDER — EZETIMIBE 10 MG PO TABS: 10.0000 mg | ORAL_TABLET | Freq: Every day | ORAL | 1 refills | Status: DC

## 2022-07-08 NOTE — Telephone Encounter (Signed)
*  STAT* If patient is at the pharmacy, call can be transferred to refill team.   1. Which medications need to be refilled? (please list name of each medication and dose if known) ezetimibe (ZETIA) 10 MG tablet  2. Which pharmacy/location (including street and city if local pharmacy) is medication to be sent to? [facility] [address] - [place], [address]  3. Do they need a 30 day or 90 day supply? [place]

## 2022-07-08 NOTE — Telephone Encounter (Signed)
Patient called and stated that he was going out of town and needed his Rx. Stated that he is out.  Refill sent to pharmacy as requested.

## 2022-07-08 NOTE — Telephone Encounter (Signed)
Refill to pharmacy 

## 2022-07-15 DIAGNOSIS — G4733 Obstructive sleep apnea (adult) (pediatric): Secondary | ICD-10-CM | POA: Diagnosis not present

## 2022-07-20 ENCOUNTER — Other Ambulatory Visit: Payer: Self-pay

## 2022-07-20 MED ORDER — SPIRONOLACTONE 25 MG PO TABS: 25.0000 mg | ORAL_TABLET | Freq: Every day | ORAL | 1 refills | Status: DC

## 2022-07-20 NOTE — Telephone Encounter (Signed)
High warning came up when trying to refill medication.  Medication pended and asent to Sherrie Mustache, NP

## 2022-07-21 ENCOUNTER — Other Ambulatory Visit: Payer: Self-pay

## 2022-07-21 MED ORDER — TRAZODONE HCL 50 MG PO TABS: 50.0000 mg | ORAL_TABLET | Freq: Every evening | ORAL | 1 refills | Status: DC | PRN

## 2022-07-21 NOTE — Telephone Encounter (Signed)
[facility] has faxed medication refill for patient. Patient medication for refill is Trazodone '50mg'$ . Patient last refill dated 02/16/2022. Message routed to PCP Dewaine Oats, Carlos American, NP for approval.

## 2022-07-22 ENCOUNTER — Other Ambulatory Visit: Payer: Self-pay

## 2022-07-22 MED ORDER — ISOSORBIDE MONONITRATE ER 30 MG PO TB24: ORAL_TABLET | ORAL | 1 refills | Status: DC

## 2022-08-11 ENCOUNTER — Telehealth: Payer: Self-pay

## 2022-08-11 DIAGNOSIS — E782 Mixed hyperlipidemia: Secondary | ICD-10-CM

## 2022-08-11 MED ORDER — SIMVASTATIN 40 MG PO TABS: 40.0000 mg | ORAL_TABLET | Freq: Every day | ORAL | 3 refills | Status: DC

## 2022-08-11 NOTE — Telephone Encounter (Signed)
Pharmacy send a rx request for Simvastatin 40 mg and medication was send into pharmacy for refill.

## 2022-08-24 ENCOUNTER — Encounter: Payer: Self-pay | Admitting: Nurse Practitioner

## 2022-08-24 ENCOUNTER — Ambulatory Visit (INDEPENDENT_AMBULATORY_CARE_PROVIDER_SITE_OTHER): Payer: Medicare HMO | Admitting: Nurse Practitioner

## 2022-08-24 VITALS — BP 112/78 | HR 64 | Temp 97.1°F | Ht 73.0 in | Wt 217.0 lb

## 2022-08-24 DIAGNOSIS — I10 Essential (primary) hypertension: Secondary | ICD-10-CM | POA: Diagnosis not present

## 2022-08-24 DIAGNOSIS — M1A9XX0 Chronic gout, unspecified, without tophus (tophi): Secondary | ICD-10-CM

## 2022-08-24 DIAGNOSIS — K219 Gastro-esophageal reflux disease without esophagitis: Secondary | ICD-10-CM | POA: Diagnosis not present

## 2022-08-24 DIAGNOSIS — R490 Dysphonia: Secondary | ICD-10-CM | POA: Diagnosis not present

## 2022-08-24 DIAGNOSIS — R739 Hyperglycemia, unspecified: Secondary | ICD-10-CM | POA: Diagnosis not present

## 2022-08-24 DIAGNOSIS — Z87891 Personal history of nicotine dependence: Secondary | ICD-10-CM | POA: Diagnosis not present

## 2022-08-24 DIAGNOSIS — E782 Mixed hyperlipidemia: Secondary | ICD-10-CM | POA: Diagnosis not present

## 2022-08-24 MED ORDER — COLCHICINE 0.6 MG PO TABS: 0.6000 mg | ORAL_TABLET | ORAL | 5 refills | Status: DC | PRN

## 2022-08-24 NOTE — Progress Notes (Signed)
Careteam: Patient Care Team: [name], NP as PCP - General (Geriatric Medicine) Martinique, Peter M, MD as PCP - Cardiology (Cardiology) [name], [place] as Referring Physician (Dermatology) [name], MD as Referring Physician (Otolaryngology)  PLACE OF SERVICE:  [place] Directive information Does Patient Have a Medical Advance Directive?: Yes, Type of Advance Directive: [place];Living will, Does patient want to make changes to medical advance directive?: No - Patient declined  Allergies  Allergen Reactions   Pollen Extract Other (See Comments)    Stuffy nose/itchy throat (Seasonal allergies)    Chief Complaint  Patient presents with   Medical Management of Chronic Issues    3 month follow-up. Discuss need for covid boosters and AWV. Patient thinks he had flu vaccine, will check and notify us later.      HPI: Patient is a 76 y.o. male for routine follow up.   At last visit worsen GERD and hoariness, he took Protonix for a little while and It improved. Stopped taking protonix about 2 months ago and has been good since.   He reports he is in a good mood today. A lot of stress, bought a new home and has not sold his home so that is stressful.   Chronic lumbar pain- comes and goes with right hip pain. Did not feel like this was significant enough to get xray.   Had episode of chest pain on and off all day ~2-3 weeks ago. 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  He did not notify anyone because it resolved after 1 ntg.   No recent gout flares  Review of Systems:  Review of Systems  Constitutional:  Negative for chills, fever and weight loss.  HENT:  Negative for tinnitus.   Respiratory:  Negative for cough, sputum production and shortness of breath.   Cardiovascular:  Negative for chest pain, palpitations and leg  swelling.  Gastrointestinal:  Negative for abdominal pain, constipation, diarrhea and heartburn.  Genitourinary:  Negative for dysuria, frequency and urgency.  Musculoskeletal:  Positive for back pain. Negative for falls, joint pain and myalgias.  Skin: Negative.   Neurological:  Negative for dizziness and headaches.  Psychiatric/Behavioral:  Negative for depression and memory loss. The patient does not have insomnia.     Past Medical History:  Diagnosis Date   Actinic keratosis 02/10/2021   left ear mid antihelix   Alcoholism ([place])    per pt   Allergy    Basal cell carcinoma 05/13/2020   left prox medial pretibial    Basal cell carcinoma 08/26/2020   below the left medial knee - tx with ED&C    Basal cell carcinoma 02/10/2021   Right lower eyelid/lat canthus Moh's 08/07/2021   Basal cell carcinoma 12/17/2021   left epigastric, superficial- EDC   Basal cell carcinoma 06/03/2022   left lateral elbow, EDC 06/24/2022   Coronary artery disease    a. 06/2017 MV: EF 45%, large inf and apical defect w/o ischemia;  b. 06/2017 Cath: LM nl, LAD 30ost, LCX 50p, OM1/2 ok, RCA 100p w/ L->R collats. EF 35%.   Depression with anxiety    Gastric ulcer, acute with hemorrhage 2001   Dr. Sharlet Salina, [facility]   H/O congenital atrial septal defect (ASD) repair    Heart murmur    ASD repair    Helicobacter pylori gastritis  Heme positive stool    History of gout    History of MRSA infection    History of peptic ulcer disease    had bleeding ulcers requiring transfusion   History of PSVT (paroxysmal supraventricular tachycardia)    Hyperlipidemia    Hypertension    Ischemic cardiomyopathy    a. 06/2017 LV gram: EF 35%.   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   eft chest parasternal sup, txt'd EDC   Squamous cell carcinoma of skin 01/29/2020   right clavicle   Squamous cell carcinoma of skin 08/13/2021   R lat pectoral - EDC 09/24/2021   Squamous cell carcinoma of skin 11/20/2021   L chest parasternal sup - tx with ED&C   Squamous cell carcinoma of skin 11/20/2021   L chest parasternal inf - tx with ED&C 12/17/21   Squamous cell carcinoma of skin 11/20/2021   R Xyphoid - tx with [facility] 12/17/21   Tobacco abuse    Past Surgical History:  Procedure Laterality Date   ASD REPAIR, OSTIUM PRIMUM  1966   basal cell carcinoma removal     COLONOSCOPY     EAR BIOPSY Left 2013   Dr.Cook--squamous cell ca ([facility])   LEFT HEART CATH AND CORONARY ANGIOGRAPHY Left 06/30/2017   Procedure: LEFT HEART CATH AND CORONARY ANGIOGRAPHY;  Surgeon: Minna Merritts, MD;  Location: [facility];  Service: Cardiovascular;  Laterality: Left;   MOHS SURGERY     ear   open heart surgery  1966   Dr. Annamaria Boots ([facility]); was PFO repair--has been normal in f/u   [facility] / [place] / [facility])   [place]     Social History:   reports that he quit smoking about 7 years ago. His smoking use included cigarettes. He has never used smokeless tobacco. He reports that he does not drink alcohol and does not use drugs.  Family History  Problem Relation Age of Onset   Failure to thrive Mother    Arthritis Mother    Other Mother        Colectomy   Brain cancer Father    Hypertension Sister    Brain cancer Sister    Other Sister        Blastoma   Stomach cancer Maternal Grandmother    Cancer Paternal Grandfather        type unknown   Liver cancer Paternal Uncle    Other Other        mother's siblings died of cancer ( abdominal)   Colon cancer Neg Hx    Colon polyps Neg Hx    Esophageal cancer Neg Hx    Rectal cancer Neg Hx      Medications: Patient's Medications  New Prescriptions   No medications on file  Previous Medications   ASPIRIN EC 81 MG TABLET    Take 81 mg by mouth daily.   BUPROPION (WELLBUTRIN XL) 300 MG 24 HR TABLET    TAKE ONE TABLET BY MOUTH ONE TIME DAILY   COLCHICINE 0.6 MG TABLET  Take 0.6 mg by mouth as needed.   EZETIMIBE (ZETIA) 10 MG TABLET    Take 1 tablet (10 mg total) by mouth daily.   HYDROCHLOROTHIAZIDE (HYDRODIURIL) 25 MG TABLET    TAKE 1 TABLET (25 MG TOTAL) BY MOUTH DAILY AS NEEDED.   ISOSORBIDE MONONITRATE (IMDUR) 30 MG 24 HR TABLET    TAKE 1/2 OF A TABLET (15 MG TOTAL) BY MOUTH DAILY   LOSARTAN (COZAAR) 25 MG TABLET    Take 1 tablet (25 mg total) by mouth daily.   METOPROLOL SUCCINATE (TOPROL-XL) 100 MG 24 HR TABLET    Take 1 tablet (100 mg total) by mouth daily. Take with or immediately following a meal.   NITROGLYCERIN (NITROSTAT) 0.4 MG SL TABLET    Place 1 tablet (0.4 mg total) under the tongue every 5 (five) minutes as needed for chest pain.   PROPRANOLOL (INDERAL) 20 MG TABLET    TAKE 1 TABLET (20 MG TOTAL) BY MOUTH AS NEEDED (AS NEEDED FOR FAST HEART RATES).   SENNA (SENOKOT) 8.6 MG TABLET    Take 2 tablets by mouth at bedtime.   SERTRALINE (ZOLOFT) 50 MG TABLET    Take 1 tablet (50 mg total) by mouth daily.   SIMVASTATIN (ZOCOR) 40 MG TABLET    Take 1 tablet (40 mg total) by mouth daily.   SPIRONOLACTONE (ALDACTONE) 25 MG TABLET    Take 1 tablet (25 mg total) by mouth daily.   TRAZODONE (DESYREL) 50 MG TABLET    Take 1 tablet (50 mg total) by mouth at bedtime as needed. for insomnia  Modified Medications   No medications on file  Discontinued Medications   PANTOPRAZOLE (PROTONIX) 40 MG TABLET    Take 1 tablet (40 mg total) by mouth daily.   TERBINAFINE (LAMISIL) 250 MG TABLET    Take 1 tablet daily    Physical Exam:  Vitals:   08/24/22 0936  BP: 112/78  Pulse: 64  Temp: (!) 97.1 F (36.2 C)  TempSrc: Temporal  SpO2: 96%  Weight: 217 lb (98.4 kg)  Height:  '6\' 1"'$  (1.854 m)   Body mass index is 28.63 kg/m. Wt Readings from Last 3 Encounters:  08/24/22 217 lb (98.4 kg)  05/22/22 218 lb (98.9 kg)  11/10/21 211 lb 2 oz (95.8 kg)    Physical Exam Constitutional:      General: He is not in acute distress.    Appearance: He is well-developed. He is not diaphoretic.  HENT:     Head: Normocephalic and atraumatic.     Right Ear: External ear normal.     Left Ear: External ear normal.     Mouth/Throat:     Pharynx: No oropharyngeal exudate.  Eyes:     Conjunctiva/sclera: Conjunctivae normal.     Pupils: Pupils are equal, round, and reactive to light.  Cardiovascular:     Rate and Rhythm: Normal rate and regular rhythm.     Heart sounds: Normal heart sounds.  Pulmonary:     Effort: Pulmonary effort is normal.     Breath sounds: Normal breath sounds.  Abdominal:     General: Bowel sounds are normal.     Palpations: Abdomen is soft.  Musculoskeletal:        General: No tenderness.     Cervical back: Normal range of motion and neck supple.     Right lower leg: No edema.     Left lower leg: No edema.  Skin:    General: Skin is warm  and dry.  Neurological:     Mental Status: He is alert and oriented to person, place, and time.    Labs reviewed: Basic Metabolic Panel: Recent Labs    11/07/21 0000 05/22/22 1018  NA 135 136  K 4.5 4.6  CL 101 101  CO2 26 26  GLUCOSE 104* 106*  BUN 24 19  CREATININE 1.64* 1.49*  CALCIUM 9.8 9.4   Liver Function Tests: Recent Labs    11/07/21 0000 05/22/22 1018  AST 19 18  ALT 15 15  BILITOT 0.7 0.6  PROT 6.4 6.3   No results for input(s): "LIPASE", "AMYLASE" in the last 8760 hours. No results for input(s): "AMMONIA" in the last 8760 hours. CBC: Recent Labs    11/07/21 0000 05/22/22 1018  WBC 8.7 7.4  NEUTROABS 5,977 5,054  HGB 14.0 13.4  HCT 41.3 40.1  MCV 92.8 93.7  PLT 312 289   Lipid Panel: Recent Labs    11/07/21 0000 05/22/22 1018  CHOL 122 124  HDL 47 50  LDLCALC  52 51  TRIG 150* 146  CHOLHDL 2.6 2.5   TSH: No results for input(s): "TSH" in the last 8760 hours. A1C: Lab Results  Component Value Date   HGBA1C 5.8 (H) 11/07/2021   CT CHEST LUNG CA SCREEN LOW DOSE W/O CM  Result Date: 12/08/2021 CLINICAL DATA:  76 year old asymptomatic male former smoker with 50 pack-year smoking history. EXAM: CT CHEST WITHOUT CONTRAST LOW-DOSE FOR LUNG CANCER SCREENING TECHNIQUE: Multidetector CT imaging of the chest was performed following the standard protocol without IV contrast. RADIATION DOSE REDUCTION: This exam was performed according to the departmental dose-optimization program which includes automated exposure control, adjustment of the mA and/or kV according to patient size and/or use of iterative reconstruction technique. COMPARISON:  06/20/2027 screening chest CT. FINDINGS: Cardiovascular: Normal heart size. No significant pericardial effusion/thickening. Left anterior descending and right coronary atherosclerosis. Atherosclerotic thoracic aorta with dilated 4.1 cm ascending thoracic aorta, stable. Normal caliber pulmonary arteries. Mediastinum/Nodes: No discrete thyroid nodules. Unremarkable esophagus. No pathologically enlarged axillary, mediastinal or hilar lymph nodes, noting limited sensitivity for the detection of hilar adenopathy on this noncontrast study. Lungs/Pleura: No pneumothorax. No pleural effusion. Mild centrilobular emphysema with diffuse bronchial wall thickening. No acute consolidative airspace disease or lung masses. No significant pulmonary nodules. Upper abdomen: Nonobstructing 3 mm upper left renal stone. Musculoskeletal: No aggressive appearing focal osseous lesions. Mild thoracic spondylosis. Intact sternotomy wires. IMPRESSION: 1. Lung-RADS 1, negative. Continue annual screening with low-dose chest CT without contrast in 12 months. 2. Dilated 4.1 cm ascending thoracic aorta, stable, which can be reassessed on follow-up screening chest CT in  12 months. 3. Two-vessel coronary atherosclerosis. 4. Nonobstructing left nephrolithiasis. 5. Aortic Atherosclerosis (ICD10-I70.0) and Emphysema (ICD10-J43.9). Electronically Signed   By: Ilona Sorrel M.D.   On: 12/08/2021 14:59      Assessment/Plan 1. Hoarse Improved with Protonix, he stopped medication and will occasionally get symptoms of hoariness in the afternoon.   2. Gastroesophageal reflux disease without esophagitis -stable, occasionally will have worsen reflux, not on protonix at this time but has at home if he decides to restart  3. Chronic gout without tophus, unspecified cause, unspecified site -no recent flares - colchicine 0.6 MG tablet; Take 1 tablet (0.6 mg total) by mouth as needed.  Dispense: 30 tablet; Refill: 5  4. Mixed hyperlipidemia -continue dietary modifications.   - Lipid panel - COMPLETE METABOLIC PANEL WITH GFR  5. Essential hypertension, benign -Blood pressure well controlled, goal  bp <140/90 Continue current medications and dietary modifications follow metabolic panel - COMPLETE METABOLIC PANEL WITH GFR - CBC with Differential/Platelet  6. Hyperglycemia -continue with dietary and lifestyle modifications - Hemoglobin A1c  7. History of smoking 10-25 pack years Also noted dilated 4.1 cm ascending thoracic aorta, stable, which can be reassessed on follow-up screening chest CT in 12 months--ordered today for follow up - CT CHEST LUNG CA SCREEN LOW DOSE W/O CM; Future  8. Former smoker - CT CHEST LUNG CA SCREEN LOW DOSE W/O CM; Future   Return in about 6 months (around 02/22/2023) for routine follow up . [employer]. [place], [facility] [phone]

## 2022-08-25 LAB — CBC WITH DIFFERENTIAL/PLATELET
Absolute Monocytes: 671 cells/uL (ref 200–950)
Basophils Absolute: 78 cells/uL (ref 0–200)
Basophils Relative: 1 %
Eosinophils Absolute: 281 cells/uL (ref 15–500)
Eosinophils Relative: 3.6 %
HCT: 40.6 % (ref 38.5–50.0)
Hemoglobin: 13.8 g/dL (ref 13.2–17.1)
Lymphs Abs: 1638 cells/uL (ref 850–3900)
MCH: 30.8 pg (ref 27.0–33.0)
MCHC: 34 g/dL (ref 32.0–36.0)
MCV: 90.6 fL (ref 80.0–100.0)
MPV: 9.6 fL (ref 7.5–12.5)
Monocytes Relative: 8.6 %
Neutro Abs: 5132 cells/uL (ref 1500–7800)
Neutrophils Relative %: 65.8 %
Platelets: 268 10*3/uL (ref 140–400)
RBC: 4.48 10*6/uL (ref 4.20–5.80)
RDW: 12.7 % (ref 11.0–15.0)
Total Lymphocyte: 21 %
WBC: 7.8 10*3/uL (ref 3.8–10.8)

## 2022-08-25 LAB — COMPLETE METABOLIC PANEL WITH GFR
AG Ratio: 2 (calc) (ref 1.0–2.5)
ALT: 14 U/L (ref 9–46)
AST: 16 U/L (ref 10–35)
Albumin: 4.3 g/dL (ref 3.6–5.1)
Alkaline phosphatase (APISO): 42 U/L (ref 35–144)
BUN/Creatinine Ratio: 17 (calc) (ref 6–22)
BUN: 26 mg/dL — ABNORMAL HIGH (ref 7–25)
CO2: 27 mmol/L (ref 20–32)
Calcium: 9.6 mg/dL (ref 8.6–10.3)
Chloride: 102 mmol/L (ref 98–110)
Creat: 1.52 mg/dL — ABNORMAL HIGH (ref 0.70–1.28)
Globulin: 2.2 g/dL (calc) (ref 1.9–3.7)
Glucose, Bld: 119 mg/dL — ABNORMAL HIGH (ref 65–99)
Potassium: 4.5 mmol/L (ref 3.5–5.3)
Sodium: 136 mmol/L (ref 135–146)
Total Bilirubin: 0.5 mg/dL (ref 0.2–1.2)
Total Protein: 6.5 g/dL (ref 6.1–8.1)
eGFR: 47 mL/min/{1.73_m2} — ABNORMAL LOW (ref >=60)

## 2022-08-25 LAB — LIPID PANEL
Cholesterol: 118 mg/dL (ref <200)
HDL: 53 mg/dL (ref >=40)
LDL Cholesterol (Calc): 45 mg/dL (calc)
Non-HDL Cholesterol (Calc): 65 mg/dL (calc) (ref <130)
Total CHOL/HDL Ratio: 2.2 (calc) (ref <5.0)
Triglycerides: 116 mg/dL (ref <150)

## 2022-08-25 LAB — HEMOGLOBIN A1C
Hgb A1c MFr Bld: 6.1 % of total Hgb — ABNORMAL HIGH (ref <5.7)
Mean Plasma Glucose: 128 mg/dL
eAG (mmol/L): 7.1 mmol/L

## 2022-09-17 ENCOUNTER — Other Ambulatory Visit: Payer: Self-pay | Admitting: *Deleted

## 2022-09-17 MED ORDER — BUPROPION HCL ER (XL) 300 MG PO TB24: 300.0000 mg | ORAL_TABLET | Freq: Every day | ORAL | 1 refills | Status: DC

## 2022-09-17 NOTE — Telephone Encounter (Signed)
Pharmacy requested refill. Pended Rx and sent to [name] for approval due to HIGH ALERT Warning.  

## 2022-09-30 DIAGNOSIS — H2513 Age-related nuclear cataract, bilateral: Secondary | ICD-10-CM | POA: Diagnosis not present

## 2022-09-30 NOTE — Progress Notes (Unsigned)
   This service is provided via telemedicine  No vital signs collected/recorded due to the encounter was a telemedicine visit.   Location of patient (ex: home, work):  Home  Patient consents to a telephone visit: Yes, see telephone visit dated 10/01/2022  Location of the provider (ex: office, home):  [facility], Office   Name of any referring provider:  N/A  Names of all persons participating in the telemedicine service and their role in the encounter:  S.Chrae B/CMA, Sherrie Mustache, NP, and Patient   Time spent on call:  8 min with medical assistant

## 2022-10-01 ENCOUNTER — Ambulatory Visit (INDEPENDENT_AMBULATORY_CARE_PROVIDER_SITE_OTHER): Payer: Medicare HMO | Admitting: Nurse Practitioner

## 2022-10-01 ENCOUNTER — Encounter: Payer: Self-pay | Admitting: Nurse Practitioner

## 2022-10-01 DIAGNOSIS — Z0000 Encounter for general adult medical examination without abnormal findings: Secondary | ICD-10-CM | POA: Diagnosis not present

## 2022-10-01 NOTE — Patient Instructions (Signed)
Mitchell Stephens , Thank you for taking time to come for your Medicare Wellness Visit. I appreciate your ongoing commitment to your health goals. Please review the following plan we discussed and let me know if I can assist you in the future.   Screening recommendations/referrals: Colonoscopy aged out Recommended yearly ophthalmology/optometry visit for glaucoma screening and checkup Recommended yearly dental visit for hygiene and checkup  Vaccinations: Influenza vaccine due annually in September/October Pneumococcal vaccine up to date Tdap vaccine up to date Shingles vaccine up to date    Advanced directives: on file  Conditions/risks identified: advanced age  Next appointment: yearly   Preventive Care 9 Years and Older, Male Preventive care refers to lifestyle choices and visits with your health care provider that can promote health and wellness. What does preventive care include? A yearly physical exam. This is also called an annual well check. Dental exams once or twice a year. Routine eye exams. Ask your health care provider how often you should have your eyes checked. Personal lifestyle choices, including: Daily care of your teeth and gums. Regular physical activity. Eating a healthy diet. Avoiding tobacco and drug use. Limiting alcohol use. Practicing safe sex. Taking low doses of aspirin every day. Taking vitamin and mineral supplements as recommended by your health care provider. What happens during an annual well check? The services and screenings done by your health care provider during your annual well check will depend on your age, overall health, lifestyle risk factors, and family history of disease. Counseling  Your health care provider may ask you questions about your: Alcohol use. Tobacco use. Drug use. Emotional well-being. Home and relationship well-being. Sexual activity. Eating habits. History of falls. Memory and ability to understand  (cognition). Work and work Statistician. Screening  You may have the following tests or measurements: Height, weight, and BMI. Blood pressure. Lipid and cholesterol levels. These may be checked every 5 years, or more frequently if you are over 39 years old. Skin check. Lung cancer screening. You may have this screening every year starting at age 42 if you have a 30-pack-year history of smoking and currently smoke or have quit within the past 15 years. Fecal occult blood test (FOBT) of the stool. You may have this test every year starting at age 71. Flexible sigmoidoscopy or colonoscopy. You may have a sigmoidoscopy every 5 years or a colonoscopy every 10 years starting at age 54. Prostate cancer screening. Recommendations will vary depending on your family history and other risks. Hepatitis C blood test. Hepatitis B blood test. Sexually transmitted disease (STD) testing. Diabetes screening. This is done by checking your blood sugar (glucose) after you have not eaten for a while (fasting). You may have this done every 1-3 years. Abdominal aortic aneurysm (AAA) screening. You may need this if you are a current or former smoker. Osteoporosis. You may be screened starting at age 71 if you are at high risk. Talk with your health care provider about your test results, treatment options, and if necessary, the need for more tests. Vaccines  Your health care provider may recommend certain vaccines, such as: Influenza vaccine. This is recommended every year. Tetanus, diphtheria, and acellular pertussis (Tdap, Td) vaccine. You may need a Td booster every 10 years. Zoster vaccine. You may need this after age 13. Pneumococcal 13-valent conjugate (PCV13) vaccine. One dose is recommended after age 31. Pneumococcal polysaccharide (PPSV23) vaccine. One dose is recommended after age 69. Talk to your health care provider about which screenings and vaccines  you need and how often you need them. This  information is not intended to replace advice given to you by your health care provider. Make sure you discuss any questions you have with your health care provider. Document Released: 10/18/2015 Document Revised: 06/10/2016 Document Reviewed: 07/23/2015 Elsevier Interactive Patient Education  2017 [place] Prevention in the Home Falls can cause injuries. They can happen to people of all ages. There are many things you can do to make your home safe and to help prevent falls. What can I do on the outside of my home? Regularly fix the edges of walkways and driveways and fix any cracks. Remove anything that might make you trip as you walk through a door, such as a raised step or threshold. Trim any bushes or trees on the path to your home. Use bright outdoor lighting. Clear any walking paths of anything that might make someone trip, such as rocks or tools. Regularly check to see if handrails are loose or broken. Make sure that both sides of any steps have handrails. Any raised decks and porches should have guardrails on the edges. Have any leaves, snow, or ice cleared regularly. Use sand or salt on walking paths during winter. Clean up any spills in your garage right away. This includes oil or grease spills. What can I do in the bathroom? Use night lights. Install grab bars by the toilet and in the tub and shower. Do not use towel bars as grab bars. Use non-skid mats or decals in the tub or shower. If you need to sit down in the shower, use a plastic, non-slip stool. Keep the floor dry. Clean up any water that spills on the floor as soon as it happens. Remove soap buildup in the tub or shower regularly. Attach bath mats securely with double-sided non-slip rug tape. Do not have throw rugs and other things on the floor that can make you trip. What can I do in the bedroom? Use night lights. Make sure that you have a light by your bed that is easy to reach. Do not use any sheets or  blankets that are too big for your bed. They should not hang down onto the floor. Have a firm chair that has side arms. You can use this for support while you get dressed. Do not have throw rugs and other things on the floor that can make you trip. What can I do in the kitchen? Clean up any spills right away. Avoid walking on wet floors. Keep items that you use a lot in easy-to-reach places. If you need to reach something above you, use a strong step stool that has a grab bar. Keep electrical cords out of the way. Do not use floor polish or wax that makes floors slippery. If you must use wax, use non-skid floor wax. Do not have throw rugs and other things on the floor that can make you trip. What can I do with my stairs? Do not leave any items on the stairs. Make sure that there are handrails on both sides of the stairs and use them. Fix handrails that are broken or loose. Make sure that handrails are as long as the stairways. Check any carpeting to make sure that it is firmly attached to the stairs. Fix any carpet that is loose or worn. Avoid having throw rugs at the top or bottom of the stairs. If you do have throw rugs, attach them to the floor with carpet tape. Make sure  that you have a light switch at the top of the stairs and the bottom of the stairs. If you do not have them, ask someone to add them for you. What else can I do to help prevent falls? Wear shoes that: Do not have high heels. Have rubber bottoms. Are comfortable and fit you well. Are closed at the toe. Do not wear sandals. If you use a stepladder: Make sure that it is fully opened. Do not climb a closed stepladder. Make sure that both sides of the stepladder are locked into place. Ask someone to hold it for you, if possible. Clearly mark and make sure that you can see: Any grab bars or handrails. First and last steps. Where the edge of each step is. Use tools that help you move around (mobility aids) if they are  needed. These include: Canes. Walkers. Scooters. Crutches. Turn on the lights when you go into a dark area. Replace any light bulbs as soon as they burn out. Set up your furniture so you have a clear path. Avoid moving your furniture around. If any of your floors are uneven, fix them. If there are any pets around you, be aware of where they are. Review your medicines with your doctor. Some medicines can make you feel dizzy. This can increase your chance of falling. Ask your doctor what other things that you can do to help prevent falls. This information is not intended to replace advice given to you by your health care provider. Make sure you discuss any questions you have with your health care provider. Document Released: 07/18/2009 Document Revised: 02/27/2016 Document Reviewed: 10/26/2014 Elsevier Interactive Patient Education  2017 [employer].

## 2022-10-01 NOTE — Progress Notes (Signed)
Subjective:   Mitchell Stephens. is a 76 y.o. male who presents for Medicare Annual/Subsequent preventive examination.  Review of Systems           Objective:    There were no vitals filed for this visit. There is no height or weight on file to calculate BMI.     09/30/2022    3:54 PM 08/24/2022    9:38 AM 11/07/2021    8:34 AM 09/16/2021    3:18 PM 09/13/2020    9:27 AM 09/12/2020    9:22 AM 04/04/2020    1:34 PM  Advanced Directives  Does Patient Have a Medical Advance Directive? Yes Yes Yes No Yes Yes   Type of Paramedic of [place];Living will [place];Living will [place];Living will  [place];Living will [place];Living will [place]  Does patient want to make changes to medical advance directive? No - Patient declined No - Patient declined No - Patient declined  No - Patient declined No - Guardian declined No - Guardian declined  Copy of [place] in Chart? Yes - validated most recent copy scanned in chart (See row information) Yes - validated most recent copy scanned in chart (See row information) Yes - validated most recent copy scanned in chart (See row information)  Yes - validated most recent copy scanned in chart (See row information) Yes - validated most recent copy scanned in chart (See row information) Yes - validated most recent copy scanned in chart (See row information)    Current Medications (verified) Outpatient Encounter Medications as of 10/01/2022  Medication Sig   aspirin EC 81 MG tablet Take 81 mg by mouth daily.   buPROPion (WELLBUTRIN XL) 300 MG 24 hr tablet Take 1 tablet (300 mg total) by mouth daily.   colchicine 0.6 MG tablet Take 1 tablet (0.6 mg total) by mouth as needed.   ezetimibe (ZETIA) 10 MG tablet Take 1 tablet (10 mg total) by mouth daily.   hydrochlorothiazide (HYDRODIURIL) 25 MG tablet TAKE 1  TABLET (25 MG TOTAL) BY MOUTH DAILY AS NEEDED.   isosorbide mononitrate (IMDUR) 30 MG 24 hr tablet TAKE 1/2 OF A TABLET (15 MG TOTAL) BY MOUTH DAILY   losartan (COZAAR) 25 MG tablet Take 1 tablet (25 mg total) by mouth daily.   metoprolol succinate (TOPROL-XL) 100 MG 24 hr tablet Take 1 tablet (100 mg total) by mouth daily. Take with or immediately following a meal.   nitroGLYCERIN (NITROSTAT) 0.4 MG SL tablet Place 1 tablet (0.4 mg total) under the tongue every 5 (five) minutes as needed for chest pain.   propranolol (INDERAL) 20 MG tablet TAKE 1 TABLET (20 MG TOTAL) BY MOUTH AS NEEDED (AS NEEDED FOR FAST HEART RATES).   senna (SENOKOT) 8.6 MG tablet Take 2 tablets by mouth at bedtime.   sertraline (ZOLOFT) 50 MG tablet Take 1 tablet (50 mg total) by mouth daily.   simvastatin (ZOCOR) 40 MG tablet Take 1 tablet (40 mg total) by mouth daily.   spironolactone (ALDACTONE) 25 MG tablet Take 1 tablet (25 mg total) by mouth daily.   traZODone (DESYREL) 50 MG tablet Take 1 tablet (50 mg total) by mouth at bedtime as needed. for insomnia   No facility-administered encounter medications on file as of 10/01/2022.    Allergies (verified) Pollen extract   History: Past Medical History:  Diagnosis Date   Actinic keratosis 02/10/2021  left ear mid antihelix   Alcoholism ([place])    per pt   Allergy    Basal cell carcinoma 05/13/2020   left prox medial pretibial    Basal cell carcinoma 08/26/2020   below the left medial knee - tx with ED&C    Basal cell carcinoma 02/10/2021   Right lower eyelid/lat [place]hus Moh's 08/07/2021   Basal cell carcinoma 12/17/2021   left epigastric, superficial- EDC   Basal cell carcinoma 06/03/2022   left lateral elbow, EDC 06/24/2022   Coronary artery disease    a. 06/2017 MV: EF 45%, large inf and apical defect w/o ischemia;  b. 06/2017 Cath: LM nl, LAD 30ost, LCX 50p, OM1/2 ok, RCA 100p w/ L->R collats. EF 35%.   Depression with anxiety    Gastric ulcer, acute with  hemorrhage 2001   Dr. Sharlet Salina, [facility]   H/O congenital atrial septal defect (ASD) repair    Heart murmur    ASD repair    Helicobacter pylori gastritis    Heme positive stool    History of gout    History of MRSA infection    History of peptic ulcer disease    had bleeding ulcers requiring transfusion   History of PSVT (paroxysmal supraventricular tachycardia)    Hyperlipidemia    Hypertension    Ischemic cardiomyopathy    a. 06/2017 LV gram: EF 35%.   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   eft chest parasternal sup, txt'd EDC   Squamous cell carcinoma of skin 01/29/2020   right clavicle   Squamous cell carcinoma of skin 08/13/2021   R lat pectoral - EDC 09/24/2021   Squamous cell carcinoma of skin 11/20/2021   L chest parasternal sup - tx with ED&C   Squamous cell carcinoma of skin 11/20/2021   L chest parasternal inf - tx with ED&C 12/17/21   Squamous cell carcinoma of skin 11/20/2021   R Xyphoid - tx with [facility] 12/17/21   Tobacco abuse    Past Surgical History:  Procedure Laterality Date   ASD REPAIR, OSTIUM PRIMUM  1966   basal cell carcinoma removal     COLONOSCOPY     EAR BIOPSY Left 2013   Dr.Cook--squamous cell ca ([facility])   LEFT HEART CATH AND CORONARY ANGIOGRAPHY Left 06/30/2017   Procedure: LEFT HEART CATH AND CORONARY ANGIOGRAPHY;  Surgeon: Minna Merritts, MD;  Location: [place] [facility];  Service: Cardiovascular;  Laterality: Left;   MOHS SURGERY     ear   open heart surgery  1966   Dr. Annamaria Boots ([facility]); was PFO repair--has been normal in f/u   PALATE / UVULA BIOPSY / [facility])   TONSILLECTOMY AND [place]     Family History  Problem Relation Age of Onset   Failure to thrive Mother    Arthritis Mother    Other Mother        Colectomy   Brain cancer Father    Hypertension Sister    Brain cancer Sister    Other Sister        Blastoma  Stomach cancer Maternal Grandmother    Cancer Paternal Grandfather        type unknown   Liver cancer Paternal Uncle    Other Other        mother's siblings died of cancer ( abdominal)   Colon cancer Neg Hx    Colon polyps Neg Hx    Esophageal cancer Neg Hx    Rectal cancer Neg Hx    Social History   Socioeconomic History   Marital status: Married    Spouse name: Not on file   Number of children: 1   Years of education: Not on file   Highest education level: Not on file  Occupational History   Occupation: retired  Tobacco Use   Smoking status: Former    Years: 40.00    Types: Cigarettes    Quit date: 09/03/2014    Years since quitting: 8.0   Smokeless tobacco: Never  Vaping Use   Vaping Use: Never used  Substance and Sexual Activity   Alcohol use: No    Alcohol/week: 0.0 standard drinks of alcohol    Comment: previously did drink alcohol heavily   Drug use: No    Comment: likes to avoid pain meds, no illicit drugs used   Sexual activity: Yes  Other Topics Concern   Not on file  Social History Narrative   Drinks caffeine   Married in 1973   Lives in house   Two story home, 2 live in home   [place] and does yardwork   Has living will and [facility]   Social Determinants of Health   Financial Resource Strain: Low Risk  (09/07/2018)   Overall Financial Resource Strain (CARDIA)    Difficulty of Paying Living Expenses: Not hard at all  Food Insecurity: No Food Insecurity (09/07/2018)   Hunger Vital Sign    Worried About Running Out of Food in the Last Year: Never true    [place] in the Last Year: Never true  Transportation Needs: No Transportation Needs (09/07/2018)   PRAPARE - Armed forces logistics/support/administrative officer (Medical): No    Lack of Transportation (Non-Medical): No  Physical Activity: Sufficiently Active (09/07/2018)   Exercise Vital Sign    Days of Exercise per Week: 5 days    Minutes of Exercise per Session: 30 min  Stress: No Stress Concern Present (09/07/2018)   [place]    Feeling of Stress : Only a little  Social Connections: Moderately Integrated (09/07/2018)   Social Connection and Isolation Panel [NHANES]    Frequency of Communication with Friends and Family: Three times a week    Frequency of Social Gatherings with Friends and Family: Once a week    Attends Religious Services: More than 4 times per year    Active Member of Clubs or Organizations: No    Attends Archivist Meetings: Never    Marital Status: Married    Tobacco Counseling Counseling given: Not Answered   Clinical Intake:                 Diabetic?no         Activities of Daily Living     No data to display          Patient Care Team: [name], NP as PCP - General (Geriatric Medicine) [place], Peter M, MD as PCP - Cardiology (Cardiology) [name], [place] as Referring Physician (Dermatology)  [name], MD as Referring Physician (Otolaryngology) Eulogio Bear, MD as Consulting Physician (Ophthalmology)  Indicate any recent Medical Services you may have received from other than [facility] providers in the past year (date may be approximate).     Assessment:   This is a routine wellness examination for Mitchell Stephens.  Hearing/Vision screen Hearing Screening - Comments:: No hearing issues Vision Screening - Comments:: Last eye exam was 09/30/22, Dr.King, [facility]   Dietary issues and exercise activities discussed:     Goals Addressed   None    Depression Screen    10/01/2022   10:47 AM 08/24/2022    9:38 AM 05/22/2022    9:47 AM 11/07/2021   10:04 AM 11/07/2021     9:34 AM 09/16/2021    3:15 PM 09/13/2020    9:22 AM  PHQ 2/9 Scores  PHQ - 2 Score 0 0 0 6 0 0 0  PHQ- 9 Score    12       Fall Risk    10/01/2022   10:47 AM 08/24/2022    9:38 AM 05/22/2022    9:47 AM 11/07/2021    9:33 AM 09/16/2021    3:17 PM  Fall Risk   Falls in the past year? 0 0 0 0 0  Number falls in past yr: 0 0 0 0 0  Injury with Fall? 0 0 0 0 0  Risk for fall due to : No Fall Risks No Fall Risks No Fall Risks No Fall Risks No Fall Risks  Follow up Falls evaluation completed Falls evaluation completed Falls evaluation completed Falls evaluation completed Falls evaluation completed    [place]:  Any stairs in or around the home? No  If so, are there any without handrails?  na Home free of loose throw rugs in walkways, pet beds, electrical cords, etc? Yes  Adequate lighting in your home to reduce risk of falls? Yes   ASSISTIVE DEVICES UTILIZED TO PREVENT FALLS:  Life alert?  No Use of a cane, walker or w/c? No  Grab bars in the bathroom? No  Shower chair or bench in shower? Yes  Elevated toilet seat or a handicapped toilet? No   TIMED UP AND GO:  Was the test performed? No .    Cognitive Function:    09/07/2018   10:32 AM 08/12/2017    9:27 AM 08/07/2016    9:08 AM 05/09/2015    2:03 PM  MMSE - Mini Mental State Exam  Orientation to time '5 4 5 4  '$ Orientation to Place '5 5 5 5  '$ Registration '3 3 3 3  '$ Attention/ Calculation '5 5 5 5  '$ Recall '3 3 3 1  '$ Language- name 2 objects '2 2 2 2  '$ Language- repeat '1 1 1 1  '$ Language- follow 3 step command '3 3 3 3  '$ Language- read & follow direction '1 1 1 1  '$ Write a sentence '1 1 1 1  '$ Copy design '1 1 1 1  '$ Total score '30 29 30 27        '$ 10/01/2022   10:49 AM 09/16/2021    3:18 PM 09/13/2020    9:23 AM 09/12/2019    8:57 AM  6CIT Screen  What Year? 0 points 0 points 0 points 0 points  What month? 0 points 0 points 0 points 0 points  What time? 0 points 0 points 0 points 0 points   Count back from 20 0 points 0  points 0 points 0 points  Months in reverse 0 points 0 points 0 points 0 points  Repeat phrase 0 points 0 points 0 points 0 points  Total Score 0 points 0 points 0 points 0 points    Immunizations Immunization History  Administered Date(s) Administered   Covid-19, Mrna,Vaccine(Spikevax)64yr and older 06/26/2022   Fluad Quad(high Dose 65+) 06/01/2019, 06/07/2020, 07/25/2021   Influenza, High Dose Seasonal PF 06/25/2017, 07/25/2018, 07/06/2022   Influenza,inj,Quad PF,6+ Mos 08/27/2015, 07/10/2016   Moderna Covid-19 Vaccine Bivalent Booster 112yr& up 06/10/2021, 02/12/2022   Moderna Sars-Covid-2 Vaccination 10/27/2019, 11/24/2019, 07/10/2020, 01/03/2021   Pneumococcal Conjugate-13 10/05/2013   Pneumococcal Polysaccharide-23 08/07/2016   Respiratory Syncytial Virus Vaccine,Recomb Aduvanted(Arexvy) 05/25/2022   Zoster Recombinat (Shingrix) 07/06/2017, 12/08/2017    TDAP status: Due, Education has been provided regarding the importance of this vaccine. Advised may receive this vaccine at local pharmacy or Health Dept. Aware to provide a copy of the vaccination record if obtained from local pharmacy or Health Dept. Verbalized acceptance and understanding.  Flu Vaccine status: Up to date  Pneumococcal vaccine status: Up to date  Covid-19 vaccine status: Information provided on how to obtain vaccines.   Qualifies for Shingles Vaccine? Yes   Zostavax completed No   Shingrix Completed?: Yes  Screening Tests Health Maintenance  Topic Date Due   DTaP/Tdap/Td (1 - Tdap) Never done   COVID-19 Vaccine (8 - 2023-24 season) 08/21/2022   Medicare Annual Wellness (AWV)  09/16/2022   COLONOSCOPY (Pts 45-4942yrnsurance coverage will need to be confirmed)  01/23/2024   Pneumonia Vaccine 65+64ears old  Completed   INFLUENZA VACCINE  Completed   Hepatitis C Screening  Completed   Zoster Vaccines- Shingrix  Completed   HPV VACCINES  Aged Out    Health  Maintenance  Health Maintenance Due  Topic Date Due   DTaP/Tdap/Td (1 - Tdap) Never done   COVID-19 Vaccine (8 - 2023-24 season) 08/21/2022   Medicare Annual Wellness (AWV)  09/16/2022    Colorectal cancer screening: No longer required.   Lung Cancer Screening: (Low Dose CT Chest recommended if Age 43-4-80ars, 30 pack-year currently smoking OR have quit w/in 15years.) does not qualify.   Lung Cancer Screening Referral: na  Additional Screening:  Hepatitis C Screening: does qualify; Completed na  Vision Screening: Recommended annual ophthalmology exams for early detection of glaucoma and other disorders of the eye. Is the patient up to date with their annual eye exam?  Yes  Who is the provider or what is the name of the office in which the patient attends annual eye exams? [facility] If pt is not established with a provider, would they like to be referred to a provider to establish care? No .   Dental Screening: Recommended annual dental exams for proper oral hygiene  Community Resource Referral / Chronic Care Management: CRR required this visit?  No   CCM required this visit?  No      Plan:     I have personally reviewed and noted the following in the patient's chart:   Medical and social history Use of alcohol, tobacco or illicit drugs  Current medications and supplements including opioid prescriptions. Patient is not currently taking opioid prescriptions. Functional ability and status Nutritional status Physical activity Advanced directives List of other physicians Hospitalizations, surgeries, and ER visits in previous 12 months Vitals Screenings to include cognitive, depression, and falls Referrals and appointments  In addition, I have reviewed and discussed with patient certain preventive  protocols, quality metrics, and best practice recommendations. A written personalized care plan for preventive services as well as general preventive health recommendations  were provided to patient.     [name], NP   10/01/2022    Virtual Visit via Telephone Note  I connected with patient 10/01/22 at 10:40 AM EST by telephone and verified that I am speaking with the correct person using two identifiers.  Location: Patient: home Provider: [facility]   I discussed the limitations, risks, security and privacy concerns of performing an evaluation and management service by telephone and the availability of in person appointments. I also discussed with the patient that there may be a patient responsible charge related to this service. The patient expressed understanding and agreed to proceed.   I discussed the assessment and treatment plan with the patient. The patient was provided an opportunity to ask questions and all were answered. The patient agreed with the plan and demonstrated an understanding of the instructions.   The patient was advised to call back or seek an in-person evaluation if the symptoms worsen or if the condition fails to improve as anticipated.  I provided 15  minutes of non-face-to-face time during this encounter.  Carlos American. Harle Battiest Avs printed and mailed

## 2022-10-17 ENCOUNTER — Other Ambulatory Visit: Payer: Self-pay | Admitting: Family

## 2022-10-19 ENCOUNTER — Other Ambulatory Visit: Payer: Self-pay

## 2022-10-19 DIAGNOSIS — F339 Major depressive disorder, recurrent, unspecified: Secondary | ICD-10-CM

## 2022-10-19 MED ORDER — HYDROCHLOROTHIAZIDE 25 MG PO TABS: ORAL_TABLET | ORAL | 3 refills | Status: DC

## 2022-10-19 MED ORDER — SERTRALINE HCL 50 MG PO TABS: 50.0000 mg | ORAL_TABLET | Freq: Every day | ORAL | 3 refills | Status: DC

## 2022-10-19 NOTE — Telephone Encounter (Signed)
Warnings came up when trying to refill medication

## 2022-10-19 NOTE — Telephone Encounter (Signed)
High risk or very high risk warning populated when attempting to refill medication. RX request sent to PCP for review and approval if warranted.   

## 2022-11-24 ENCOUNTER — Ambulatory Visit: Payer: Medicare HMO | Admitting: Cardiovascular Disease

## 2022-11-30 ENCOUNTER — Ambulatory Visit: Payer: Medicare HMO | Admitting: Nurse Practitioner

## 2022-12-07 ENCOUNTER — Ambulatory Visit: Payer: Medicare HMO | Admitting: Nurse Practitioner

## 2022-12-09 ENCOUNTER — Ambulatory Visit: Payer: Medicare HMO | Admitting: Dermatology

## 2022-12-09 ENCOUNTER — Ambulatory Visit: Payer: Medicare HMO | Admitting: Cardiovascular Disease

## 2022-12-16 ENCOUNTER — Other Ambulatory Visit: Payer: Self-pay

## 2022-12-16 DIAGNOSIS — I10 Essential (primary) hypertension: Secondary | ICD-10-CM

## 2022-12-16 NOTE — Telephone Encounter (Signed)
High warning came up when refilling medication.   Medications pended and sent to Sherrie Mustache, NP

## 2022-12-17 MED ORDER — EZETIMIBE 10 MG PO TABS: 10.0000 mg | ORAL_TABLET | Freq: Every day | ORAL | 1 refills | Status: DC

## 2022-12-17 MED ORDER — LOSARTAN POTASSIUM 25 MG PO TABS: 25.0000 mg | ORAL_TABLET | Freq: Every day | ORAL | 1 refills | Status: DC

## 2022-12-18 ENCOUNTER — Ambulatory Visit: Payer: Medicare HMO | Admitting: Nurse Practitioner

## 2023-01-06 HISTORY — PX: TOOTH EXTRACTION: SUR596

## 2023-01-08 ENCOUNTER — Encounter: Payer: Self-pay | Admitting: Nurse Practitioner

## 2023-01-11 ENCOUNTER — Ambulatory Visit
Admission: RE | Admit: 2023-01-11 | Discharge: 2023-01-11 | Disposition: A | Discharge status: Home / Self Care | Payer: Medicare HMO | Source: Ambulatory Visit | Attending: Nurse Practitioner | Admitting: Nurse Practitioner

## 2023-01-11 ENCOUNTER — Telehealth: Payer: Self-pay

## 2023-01-11 DIAGNOSIS — I2510 Atherosclerotic heart disease of native coronary artery without angina pectoris: Secondary | ICD-10-CM | POA: Diagnosis not present

## 2023-01-11 DIAGNOSIS — I77810 Thoracic aortic ectasia: Secondary | ICD-10-CM | POA: Diagnosis not present

## 2023-01-11 DIAGNOSIS — Z87891 Personal history of nicotine dependence: Secondary | ICD-10-CM

## 2023-01-11 DIAGNOSIS — Z122 Encounter for screening for malignant neoplasm of respiratory organs: Secondary | ICD-10-CM | POA: Diagnosis not present

## 2023-01-11 NOTE — Telephone Encounter (Signed)
Antibiotics are given to treat specific things, there is not just one that can be given just in case unfortunately.

## 2023-01-11 NOTE — Telephone Encounter (Signed)
Patient is now saying he is requesting a prophylactic antibiotic because [place] is [place].

## 2023-01-11 NOTE — Telephone Encounter (Signed)
Called and left detailed message for patient

## 2023-01-11 NOTE — Addendum Note (Signed)
Encounter addended by: Dorthea Cove on: 01/11/2023 10:00 AM  Actions taken: Imaging Exam ended

## 2023-01-11 NOTE — Telephone Encounter (Signed)
Patient left message on clinical intake voicemail requesting an antibiotic. Patient stated that he had dental surgery the end of last week. He [place] getting ready [place] and would like to have an antibiotic. He would like to have one that is not sun sensitive a he will be [place] most [place] time [place] a boat. Please advise.  Message sent to Abbey Chatters, NP

## 2023-01-11 NOTE — Telephone Encounter (Signed)
If he needs antibiotic due to dental surgery would have him call the dentist.

## 2023-01-13 NOTE — Telephone Encounter (Signed)
Late entry:   I called and left detailed message for patient regarding antibiotic. Informed patient to call the office if he had any additional questions.

## 2023-01-15 ENCOUNTER — Other Ambulatory Visit: Payer: Self-pay | Admitting: *Deleted

## 2023-01-15 MED ORDER — ISOSORBIDE MONONITRATE ER 30 MG PO TB24: ORAL_TABLET | ORAL | 1 refills | Status: DC

## 2023-01-15 MED ORDER — TRAZODONE HCL 50 MG PO TABS: 50.0000 mg | ORAL_TABLET | Freq: Every evening | ORAL | 1 refills | Status: DC | PRN

## 2023-01-15 MED ORDER — SPIRONOLACTONE 25 MG PO TABS: 25.0000 mg | ORAL_TABLET | Freq: Every day | ORAL | 1 refills | Status: DC

## 2023-01-15 NOTE — Telephone Encounter (Signed)
[facility] requested refill for patient's Trazodone.   Pended Rx and sent to [facility] for approval due to HIGH ALERT Warning.

## 2023-01-18 ENCOUNTER — Ambulatory Visit: Payer: Medicare HMO | Admitting: Nurse Practitioner

## 2023-01-22 ENCOUNTER — Ambulatory Visit (INDEPENDENT_AMBULATORY_CARE_PROVIDER_SITE_OTHER): Payer: Medicare HMO | Admitting: Nurse Practitioner

## 2023-01-22 ENCOUNTER — Encounter: Payer: Self-pay | Admitting: Nurse Practitioner

## 2023-01-22 VITALS — BP 118/70 | HR 65 | Temp 97.1°F | Ht 73.0 in | Wt 215.0 lb

## 2023-01-22 DIAGNOSIS — I10 Essential (primary) hypertension: Secondary | ICD-10-CM

## 2023-01-22 DIAGNOSIS — K5904 Chronic idiopathic constipation: Secondary | ICD-10-CM | POA: Diagnosis not present

## 2023-01-22 DIAGNOSIS — I5022 Chronic systolic (congestive) heart failure: Secondary | ICD-10-CM

## 2023-01-22 DIAGNOSIS — R739 Hyperglycemia, unspecified: Secondary | ICD-10-CM

## 2023-01-22 DIAGNOSIS — I25118 Atherosclerotic heart disease of native coronary artery with other forms of angina pectoris: Secondary | ICD-10-CM | POA: Diagnosis not present

## 2023-01-22 DIAGNOSIS — R490 Dysphonia: Secondary | ICD-10-CM

## 2023-01-22 DIAGNOSIS — R69 Illness, unspecified: Secondary | ICD-10-CM | POA: Diagnosis not present

## 2023-01-22 DIAGNOSIS — E782 Mixed hyperlipidemia: Secondary | ICD-10-CM

## 2023-01-22 DIAGNOSIS — I479 Paroxysmal tachycardia, unspecified: Secondary | ICD-10-CM

## 2023-01-22 DIAGNOSIS — F339 Major depressive disorder, recurrent, unspecified: Secondary | ICD-10-CM

## 2023-01-22 NOTE — Progress Notes (Unsigned)
Careteam: Patient Care Team: Sharon Seller, NP as PCP - General (Geriatric Medicine) [place], Peter M, MD as PCP - Cardiology (Cardiology) Satira Mccallum, [place] as Referring Physician (Dermatology) Bud Face, MD as Referring Physician (Otolaryngology) Nevada Crane, MD as Consulting Physician (Ophthalmology)  PLACE OF SERVICE:  [facility]  Advanced Directive information Does Patient Have a Medical Advance Directive?: Yes, Type of Advance Directive: Healthcare Power of [place];Living will, Does patient want to make changes to medical advance directive?: No - Patient declined  Allergies  Allergen Reactions   Pollen Extract Other (See Comments)    Stuffy nose/itchy throat (Seasonal allergies)    Chief Complaint  Patient presents with   Medical Management of Chronic Issues    3 month follow-up. Discuss need for additional covid boosters and td/tdap or post pone if patient refuses or is not a candidate. NCIR verified. Patient denies receiving any vaccines since last visit.       HPI: Patient is a 77 y.o. male for routine follow up.  Just got back from a 10 day trip [place] around [place] on a cat[place]- a bucket list item.  Food was amazing.   Had episode of swelling in ankles- this has improved No chest pains.  Reports he was swimming and could not breath- had more shortness of breath.  Following up with cardiologist later this month.   Bowels controlled on senna  Mood has been good.   His hoarness has resolved, stopped protonix.   Review of Systems:  ROS***  Past Medical History:  Diagnosis Date   Actinic keratosis 02/10/2021   left ear mid antihelix   Alcoholism    per pt   Allergy    Basal cell carcinoma 05/13/2020   left prox medial pretibial    Basal cell carcinoma 08/26/2020   below the left medial knee - tx with ED&C    Basal cell carcinoma 02/10/2021   Right lower eyelid/lat canthus Moh's 08/07/2021   Basal cell carcinoma  12/17/2021   left epigastric, superficial- EDC   Basal cell carcinoma 06/03/2022   left lateral elbow, EDC 06/24/2022   Coronary artery disease    a. 06/2017 MV: EF 45%, large inf and apical defect w/o ischemia;  b. 06/2017 Cath: LM nl, LAD 30ost, LCX 50p, OM1/2 ok, RCA 100p w/ L->R collats. EF 35%.   Depression with anxiety    Gastric ulcer, acute with hemorrhage 2001   Dr. Okey Dupre, [facility]   H/O congenital atrial septal defect (ASD) repair    Heart murmur    ASD repair    Helicobacter pylori gastritis    Heme positive stool    History of gout    History of MRSA infection    History of peptic ulcer disease    had bleeding ulcers requiring transfusion   History of PSVT (paroxysmal supraventricular tachycardia)    Hyperlipidemia    Hypertension    Ischemic cardiomyopathy    a. 06/2017 LV gram: EF 35%.   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   eft chest parasternal sup, txt'd EDC   Squamous cell carcinoma of skin 01/29/2020  right clavicle   Squamous cell carcinoma of skin 08/13/2021   R lat pectoral - EDC 09/24/2021   Squamous cell carcinoma of skin 11/20/2021   L chest parasternal sup - tx with ED&C   Squamous cell carcinoma of skin 11/20/2021   L chest parasternal inf - tx with ED&C 12/17/21   Squamous cell carcinoma of skin 11/20/2021   R Xyphoid - tx with [facility] 12/17/21   Tobacco abuse    Past Surgical History:  Procedure Laterality Date   ASD REPAIR, OSTIUM PRIMUM  1966   basal cell carcinoma removal     COLONOSCOPY     EAR BIOPSY Left 2013   Dr.Cook--squamous cell ca ([facility])   LEFT HEART CATH AND CORONARY ANGIOGRAPHY Left 06/30/2017   Procedure: LEFT HEART CATH AND  CORONARY ANGIOGRAPHY;  Surgeon: Antonieta Iba, MD;  Location: [facility] INVASIVE CV LAB;  Service: Cardiovascular;  Laterality: Left;   MOHS SURGERY     ear   open heart surgery  1966   Dr. Maple Hudson ([facility]); was PFO repair--has been normal in f/u   PALATE / UVULA BIOPSY / EXCISION  2000   Dr.Sprenhe [facility])   TONSILLECTOMY AND ADENOIDECTOMY  1959   TOOTH EXTRACTION  01/06/2023   Dentist   UVULECTOMY     Social History:   reports that he quit smoking about 8 years ago. His smoking use included cigarettes. He has never used smokeless tobacco. He reports that he does not drink alcohol and does not use drugs.  Family History  Problem Relation Age of Onset   Failure to thrive Mother    Arthritis Mother    Other Mother        Colectomy   Brain cancer Father    Hypertension Sister    Brain cancer Sister    Other Sister        Blastoma   Stomach cancer Maternal Grandmother    Cancer Paternal Grandfather        type unknown   Liver cancer Paternal Uncle    Other Other        mother's siblings died of cancer ( abdominal)   Colon cancer Neg Hx    Colon polyps Neg Hx    Esophageal cancer Neg Hx    Rectal cancer Neg Hx     Medications: Patient's Medications  New Prescriptions   No medications on file  Previous Medications   ASPIRIN EC 81 MG TABLET    Take 81 mg by mouth daily.   BUPROPION (WELLBUTRIN XL) 300 MG 24 HR TABLET    Take 1 tablet (300 mg total) by mouth daily.   COLCHICINE 0.6 MG TABLET    Take 1 tablet (0.6 mg total) by mouth as needed.   EZETIMIBE (ZETIA) 10 MG TABLET    Take 1 tablet (10 mg total) by mouth daily.   HYDROCHLOROTHIAZIDE (HYDRODIURIL) 25 MG TABLET    TAKE 1 TABLET (25 MG TOTAL) BY MOUTH DAILY AS NEEDED.   ISOSORBIDE MONONITRATE (IMDUR) 30 MG 24 HR TABLET    TAKE 1/2 OF A TABLET (15 MG TOTAL) BY MOUTH DAILY   LOSARTAN (COZAAR) 25 MG TABLET    Take 1 tablet (25 mg total) by mouth daily.   METOPROLOL SUCCINATE (TOPROL-XL) 100 MG 24 HR TABLET    TAKE ONE TABLET  BY MOUTH ONE TIME DAILY, TAKE WITH OR IMMEDIATELY FOLLOWING A MEAL   NITROGLYCERIN (NITROSTAT) 0.4 MG SL TABLET    Place 1 tablet (0.4 mg total) under  the tongue every 5 (five) minutes as needed for chest pain.   PROPRANOLOL (INDERAL) 20 MG TABLET    TAKE 1 TABLET (20 MG TOTAL) BY MOUTH AS NEEDED (AS NEEDED FOR FAST HEART RATES).   SENNA (SENOKOT) 8.6 MG TABLET    Take 2 tablets by mouth at bedtime.   SERTRALINE (ZOLOFT) 50 MG TABLET    Take 1 tablet (50 mg total) by mouth daily.   SIMVASTATIN (ZOCOR) 40 MG TABLET    Take 1 tablet (40 mg total) by mouth daily.   SPIRONOLACTONE (ALDACTONE) 25 MG TABLET    Take 1 tablet (25 mg total) by mouth daily.   TRAZODONE (DESYREL) 50 MG TABLET    Take 1 tablet (50 mg total) by mouth at bedtime as needed. for insomnia  Modified Medications   No medications on file  Discontinued Medications   No medications on file    Physical Exam:  Vitals:   01/22/23 0850  BP: 118/70  Pulse: 65  Temp: (!) 97.1 F (36.2 C)  TempSrc: Temporal  SpO2: 99%  Weight: 215 lb (97.5 kg)  Height: 6\' 1"  (1.854 m)   Body mass index is 28.37 kg/m. Wt Readings from Last 3 Encounters:  01/22/23 215 lb (97.5 kg)  08/24/22 217 lb (98.4 kg)  05/22/22 218 lb (98.9 kg)    Physical Exam***  Labs reviewed: Basic Metabolic Panel: Recent Labs    05/22/22 1018 08/24/22 1029  NA 136 136  K 4.6 4.5  CL 101 102  CO2 26 27  GLUCOSE 106* 119*  BUN 19 26*  CREATININE 1.49* 1.52*  CALCIUM 9.4 9.6   Liver Function Tests: Recent Labs    05/22/22 1018 08/24/22 1029  AST 18 16  ALT 15 14  BILITOT 0.6 0.5  PROT 6.3 6.5   No results for input(s): "LIPASE", "AMYLASE" in the last 8760 hours. No results for input(s): "AMMONIA" in the last 8760 hours. CBC: Recent Labs    05/22/22 1018 08/24/22 1029  WBC 7.4 7.8  NEUTROABS 5,054 5,132  HGB 13.4 13.8  HCT 40.1 40.6  MCV 93.7 90.6  PLT 289 268   Lipid Panel: Recent Labs    05/22/22 1018 08/24/22 1029  CHOL 124  118  HDL 50 53  LDLCALC 51 45  TRIG 146 116  CHOLHDL 2.5 2.2   TSH: No results for input(s): "TSH" in the last 8760 hours. A1C: Lab Results  Component Value Date   HGBA1C 6.1 (H) 08/24/2022     Assessment/Plan There are no diagnoses linked to this encounter.  No follow-ups on file.: ***  Blimi Godby K. Biagio [facility] [phone]

## 2023-01-22 NOTE — Patient Instructions (Signed)
1.) Additional covid boosters and the td/tdap vaccine can be given at your local pharmacy.  2.) Check with daughter about living will status

## 2023-01-23 LAB — COMPLETE METABOLIC PANEL WITH GFR
AG Ratio: 2 (calc) (ref 1.0–2.5)
ALT: 18 U/L (ref 9–46)
AST: 18 U/L (ref 10–35)
Albumin: 4.2 g/dL (ref 3.6–5.1)
Alkaline phosphatase (APISO): 47 U/L (ref 35–144)
BUN/Creatinine Ratio: 15 (calc) (ref 6–22)
BUN: 23 mg/dL (ref 7–25)
CO2: 27 mmol/L (ref 20–32)
Calcium: 9.7 mg/dL (ref 8.6–10.3)
Chloride: 102 mmol/L (ref 98–110)
Creat: 1.51 mg/dL — ABNORMAL HIGH (ref 0.70–1.28)
Globulin: 2.1 g/dL (calc) (ref 1.9–3.7)
Glucose, Bld: 120 mg/dL — ABNORMAL HIGH (ref 65–99)
Potassium: 4.8 mmol/L (ref 3.5–5.3)
Sodium: 137 mmol/L (ref 135–146)
Total Bilirubin: 0.4 mg/dL (ref 0.2–1.2)
Total Protein: 6.3 g/dL (ref 6.1–8.1)
eGFR: 47 mL/min/{1.73_m2} — ABNORMAL LOW (ref >=60)

## 2023-01-23 LAB — CBC WITH DIFFERENTIAL/PLATELET
Absolute Monocytes: 640 cells/uL (ref 200–950)
Basophils Absolute: 94 cells/uL (ref 0–200)
Basophils Relative: 1.2 %
Eosinophils Absolute: 281 cells/uL (ref 15–500)
Eosinophils Relative: 3.6 %
HCT: 39.3 % (ref 38.5–50.0)
Hemoglobin: 13.1 g/dL — ABNORMAL LOW (ref 13.2–17.1)
Lymphs Abs: 1404 cells/uL (ref 850–3900)
MCH: 30.5 pg (ref 27.0–33.0)
MCHC: 33.3 g/dL (ref 32.0–36.0)
MCV: 91.6 fL (ref 80.0–100.0)
MPV: 9.6 fL (ref 7.5–12.5)
Monocytes Relative: 8.2 %
Neutro Abs: 5382 cells/uL (ref 1500–7800)
Neutrophils Relative %: 69 %
Platelets: 308 10*3/uL (ref 140–400)
RBC: 4.29 10*6/uL (ref 4.20–5.80)
RDW: 12.9 % (ref 11.0–15.0)
Total Lymphocyte: 18 %
WBC: 7.8 10*3/uL (ref 3.8–10.8)

## 2023-01-23 LAB — HEMOGLOBIN A1C
Hgb A1c MFr Bld: 6.3 % of total Hgb — ABNORMAL HIGH (ref <5.7)
Mean Plasma Glucose: 134 mg/dL
eAG (mmol/L): 7.4 mmol/L

## 2023-01-25 NOTE — Progress Notes (Unsigned)
Date:  01/26/2023   ID:  Mitchell Mola., DOB 03-02-1946, MRN [id number]  Patient Location:  [address] [place] [address]   Provider location:   [facility], [place] office for  PCP:  Sharon Seller, NP  Cardiologist:  [facility]   Chief Complaint  Patient presents with   Follow-up    Patient feels that his SOBr has worsened over the past year.    History of Present Illness:    Mitchell Kutch. is a 77 y.o. male  past medical history of Coronary artery disease, occluded RCA proximal region (presumably started March 2018) History of smoking, stopped November 2017 OSA on CPAP HTN Hyperlipidemia Former alcohol abuse, stopped >10 years ago, Prior episodes of tachycardia associated with alcohol, previously declined monitor asd repair, age 44 EF previously 35% by catheterization, estimated  50% on echo 09/2017, moderate MR Who presents for follow-up of his coronary artery disease, occluded RCA  Last seen in clinic by myself feb 2023 Covid in dec 2023  Recent sailing trip to the [place] to sure, had to stop halfway was very short of breath, floated on his back until help arrived  In general denies significant shortness of breath or or chest pain with routine activities No PND orthopnea, no leg edema  A1c with slight trend upwards  Prior stress test 2022, fixed defect inferior wall Last echocardiogram 2018, EF 50%, moderate MR  Lab work reviewed Total 118 LDL 45 A1c 6.3  EKG personally reviewed by myself on todays visit Shows normal sinus rhythm rate 68 bpm old inferior MI, right bundle branch block  Other past medical history reviewed stress testing 06/2017 which showed a large area of inferior apical scar without ischemia and an EF of 45%.    Diagnostic catheterization showed an occluded proximal right coronary artery with left to right collaterals and EF of 35-45%.  F  Echo in  December showed improvement in EF to 50% with moderate MR  Cardiac catheterization June 30, 2017 Prox LAD lesion, 30 %stenosed. Prox Cx lesion, 50 %stenosed. Prox RCA lesion, 100 %stenosed. The left ventricular ejection fraction is 35-45% by visual estimate.   Past Medical History:  Diagnosis Date   Actinic keratosis 02/10/2021   left ear mid antihelix   Alcoholism    per pt   Allergy    Basal cell carcinoma 05/13/2020   left prox medial pretibial    Basal cell carcinoma 08/26/2020   below the left medial knee - tx with ED&C    Basal cell carcinoma 02/10/2021   Right lower eyelid/lat canthus Moh's 08/07/2021   Basal cell carcinoma 12/17/2021   left epigastric, superficial- EDC   Basal cell carcinoma 06/03/2022   left lateral elbow, EDC 06/24/2022   Coronary artery disease    a. 06/2017 MV: EF 45%, large inf and apical defect w/o ischemia;  b. 06/2017 Cath: LM nl, LAD 30ost, LCX 50p, OM1/2 ok, RCA 100p w/ L->R collats. EF 35%.   Depression with anxiety    Gastric ulcer, acute with hemorrhage 2001   Dr. Okey Dupre, [facility]   H/O congenital atrial septal defect (ASD) repair    Heart murmur    ASD repair    Helicobacter pylori gastritis    Heme positive stool    History of gout    History of MRSA infection    History of peptic ulcer disease    had bleeding  ulcers requiring transfusion   History of PSVT (paroxysmal supraventricular tachycardia)    Hyperlipidemia    Hypertension    Ischemic cardiomyopathy    a. 06/2017 LV gram: EF 35%.   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   eft chest parasternal sup, txt'd EDC   Squamous cell carcinoma of skin 01/29/2020   right  clavicle   Squamous cell carcinoma of skin 08/13/2021   R lat pectoral - EDC 09/24/2021   Squamous cell carcinoma of skin 11/20/2021   L chest parasternal sup - tx with ED&C   Squamous cell carcinoma of skin 11/20/2021   L chest parasternal inf - tx with ED&C 12/17/21   Squamous cell carcinoma of skin 11/20/2021   R Xyphoid - tx with [facility] 12/17/21   Tobacco abuse    Past Surgical History:  Procedure Laterality Date   ASD REPAIR, OSTIUM PRIMUM  1966   basal cell carcinoma removal     COLONOSCOPY     EAR BIOPSY Left 2013   Dr.Cook--squamous cell ca ([facility])   LEFT HEART CATH AND CORONARY ANGIOGRAPHY Left 06/30/2017   Procedure: LEFT HEART CATH AND CORONARY ANGIOGRAPHY;  Surgeon: Antonieta Iba, MD;  Location: [facility] INVASIVE CV LAB;  Service: Cardiovascular;  Laterality: Left;   MOHS SURGERY     ear   open heart surgery  1966   Dr. Maple Hudson ([facility]); was PFO repair--has been normal in f/u   PALATE / UVULA BIOPSY / EXCISION  2000   Dr.Sprenhe [facility])   TONSILLECTOMY AND ADENOIDECTOMY  1959   TOOTH EXTRACTION  01/06/2023   Dentist   UVULECTOMY        Allergies:   Pollen extract   Social History   Tobacco Use   Smoking status: Former    Years: 40    Types: Cigarettes    Quit date: 09/03/2014    Years since quitting: 8.4   Smokeless tobacco: Never  Vaping Use   Vaping Use: Never used  Substance Use Topics   Alcohol use: No    Alcohol/week: 0.0 standard drinks of alcohol    Comment: previously did drink alcohol heavily   Drug use: No    Comment: likes to avoid pain meds, no illicit drugs used     Current Outpatient Medications on File Prior to Visit  Medication Sig Dispense Refill   aspirin EC 81 MG tablet Take 81 mg by mouth daily.     buPROPion (WELLBUTRIN XL) 300 MG 24 hr tablet Take 1 tablet (300 mg total) by mouth daily. 90 tablet 1   colchicine 0.6 MG tablet Take 1 tablet (0.6 mg total) by mouth as needed. 30 tablet 5   ezetimibe (ZETIA) 10 MG tablet Take 1 tablet  (10 mg total) by mouth daily. 90 tablet 1   hydrochlorothiazide (HYDRODIURIL) 25 MG tablet TAKE 1 TABLET (25 MG TOTAL) BY MOUTH DAILY AS NEEDED. 90 tablet 3   isosorbide mononitrate (IMDUR) 30 MG 24 hr tablet TAKE 1/2 OF A TABLET (15 MG TOTAL) BY MOUTH DAILY 45 tablet 1   losartan (COZAAR) 25 MG tablet Take 1 tablet (25 mg total) by mouth daily.  90 tablet 1   metoprolol succinate (TOPROL-XL) 100 MG 24 hr tablet TAKE ONE TABLET BY MOUTH ONE TIME DAILY, TAKE WITH OR IMMEDIATELY FOLLOWING A MEAL 90 tablet 1   nitroGLYCERIN (NITROSTAT) 0.4 MG SL tablet Place 1 tablet (0.4 mg total) under the tongue every 5 (five) minutes as needed for chest pain. 25 tablet 1   propranolol (INDERAL) 20 MG tablet TAKE 1 TABLET (20 MG TOTAL) BY MOUTH AS NEEDED (AS NEEDED FOR FAST HEART RATES). 90 tablet 1   senna (SENOKOT) 8.6 MG tablet Take 2 tablets by mouth at bedtime.     sertraline (ZOLOFT) 50 MG tablet Take 1 tablet (50 mg total) by mouth daily. 90 tablet 3   simvastatin (ZOCOR) 40 MG tablet Take 1 tablet (40 mg total) by mouth daily. 90 tablet 3   spironolactone (ALDACTONE) 25 MG tablet Take 1 tablet (25 mg total) by mouth daily. 90 tablet 1   traZODone (DESYREL) 50 MG tablet Take 1 tablet (50 mg total) by mouth at bedtime as needed. for insomnia 90 tablet 1   No current facility-administered medications on file prior to visit.     Family Hx: The patient's family history includes Arthritis in his mother; Brain cancer in his father and sister; Cancer in his paternal grandfather; Failure to thrive in his mother; Hypertension in his sister; Liver cancer in his paternal uncle; Other in his mother, sister, and another family member; Stomach cancer in his maternal grandmother. There is no history of Colon cancer, Colon polyps, Esophageal cancer, or Rectal cancer.  ROS:   Please see the history of present illness.    Review of Systems  Constitutional: Negative.   HENT: Negative.    Respiratory: Negative.     Cardiovascular: Negative.   Gastrointestinal: Negative.   Musculoskeletal: Negative.   Neurological: Negative.   Psychiatric/Behavioral: Negative.    All other systems reviewed and are negative.    Labs/Other Tests and Data Reviewed:    Recent Labs: 01/22/2023: ALT 18; BUN 23; Creat 1.51; Hemoglobin 13.1; Platelets 308; Potassium 4.8; Sodium 137   Recent Lipid Panel Lab Results  Component Value Date/Time   CHOL 118 08/24/2022 10:29 AM   CHOL 128 01/09/2016 08:25 AM   TRIG 116 08/24/2022 10:29 AM   HDL 53 08/24/2022 10:29 AM   HDL 43 01/09/2016 08:25 AM   CHOLHDL 2.2 08/24/2022 10:29 AM   LDLCALC 45 08/24/2022 10:29 AM    Wt Readings from Last 3 Encounters:  01/26/23 216 lb 9.6 oz (98.2 kg)  01/22/23 215 lb (97.5 kg)  08/24/22 217 lb (98.4 kg)     Exam:    Vital Signs: Vital signs may also be detailed in the HPI BP 118/62 (BP Location: Left Arm, Patient Position: Sitting, Cuff Size: Normal)   Pulse 68   Ht 6\' 1"  (1.854 m)   Wt 216 lb 9.6 oz (98.2 kg)   SpO2 96%   BMI 28.58 kg/m    Constitutional:  oriented to person, place, and time. No distress.  HENT:  Head: Grossly normal Eyes:  no discharge. No scleral icterus.  Neck: No JVD, no carotid bruits  Cardiovascular: Regular rate and rhythm, no murmurs appreciated Pulmonary/Chest: Clear to auscultation bilaterally, no wheezes or rails Abdominal: Soft.  no distension.  no tenderness.  Musculoskeletal: Normal range of motion Neurological:  normal muscle tone. Coordination normal. No atrophy Skin: Skin warm and dry Psychiatric: normal affect, pleasant   ASSESSMENT & PLAN:    Problem List Items Addressed This  Visit       Cardiology Problems   Paroxysmal tachycardia   Atherosclerosis of native coronary artery of native heart with stable angina pectoris - Primary   Cardiomyopathy   Essential hypertension, benign   Hyperlipidemia     Other   History of tobacco abuse   Other Visit Diagnoses     Chronic  systolic heart failure       Paroxysmal SVT (supraventricular tachycardia)         Tachycardia Well-controlled, continue metoprolol succinate 100 daily  CAD with stable angina Currently with no symptoms of angina. No further workup at this time. Continue current medication regimen. Recent episode of shortness of breath with heavy exertion, likely conditioning Echocardiogram ordered  Hyperlipidemia Cholesterol is at goal on the current lipid regimen. No changes to the medications were made.  Hypertension Blood pressure is well controlled on today's visit. No changes made to the medications.  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

## 2023-01-26 ENCOUNTER — Ambulatory Visit: Payer: Medicare HMO | Attending: Cardiovascular Disease | Admitting: Cardiovascular Disease

## 2023-01-26 ENCOUNTER — Ambulatory Visit: Payer: Medicare HMO | Attending: Cardiovascular Disease

## 2023-01-26 ENCOUNTER — Encounter: Payer: Self-pay | Admitting: Cardiovascular Disease

## 2023-01-26 VITALS — BP 118/62 | HR 68 | Ht 73.0 in | Wt 216.6 lb

## 2023-01-26 DIAGNOSIS — I10 Essential (primary) hypertension: Secondary | ICD-10-CM | POA: Diagnosis not present

## 2023-01-26 DIAGNOSIS — I25118 Atherosclerotic heart disease of native coronary artery with other forms of angina pectoris: Secondary | ICD-10-CM

## 2023-01-26 DIAGNOSIS — Z87891 Personal history of nicotine dependence: Secondary | ICD-10-CM

## 2023-01-26 DIAGNOSIS — I5022 Chronic systolic (congestive) heart failure: Secondary | ICD-10-CM

## 2023-01-26 DIAGNOSIS — E782 Mixed hyperlipidemia: Secondary | ICD-10-CM | POA: Diagnosis not present

## 2023-01-26 DIAGNOSIS — I471 Supraventricular tachycardia, unspecified: Secondary | ICD-10-CM

## 2023-01-26 DIAGNOSIS — I479 Paroxysmal tachycardia, unspecified: Secondary | ICD-10-CM

## 2023-01-26 DIAGNOSIS — I255 Ischemic cardiomyopathy: Secondary | ICD-10-CM | POA: Diagnosis not present

## 2023-01-26 LAB — ECHOCARDIOGRAM COMPLETE
AR max vel: 3.33 cm2
AV Area VTI: 3.22 cm2
AV Area mean vel: 3.32 cm2
AV Mean grad: 4 mmHg
AV Peak grad: 7 mmHg
Ao pk vel: 1.32 m/s
Area-P 1/2: 2.68 cm2
Calc EF: 56.8 %
Height: 73 in
S' Lateral: 3.5 cm
Single Plane A2C EF: 54 %
Single Plane A4C EF: 60.9 %
Weight: 3465.6 oz

## 2023-01-26 MED ORDER — NITROGLYCERIN 0.4 MG SL SUBL
0.4000 mg | SUBLINGUAL_TABLET | SUBLINGUAL | 1 refills | Status: DC | PRN
Start: 2023-01-26 — End: 2024-06-23

## 2023-01-26 NOTE — Patient Instructions (Addendum)
Medication Instructions:  No changes  If you need a refill on your cardiac medications before your next appointment, please call your pharmacy.   Lab work: No new labs needed  Testing/Procedures: Echo for CAD, shortness of breath  Your physician has requested that you have an echocardiogram. Echocardiography is a painless test that uses sound waves to create images of your heart. It provides your doctor with information about the size and shape of your heart and how well your heart's chambers and valves are working.   You may receive an ultrasound enhancing agent through an IV if needed to better visualize your heart during the echo. This procedure takes approximately one hour.  There are no restrictions for this procedure.  This will take place at [address], [place] [address]   Follow-Up: At [facility], you and your health needs are our priority.  As part of our continuing mission to provide you with exceptional heart care, we have created designated Provider Care Teams.  These Care Teams include your primary Cardiologist (physician) and Advanced Practice Providers (APPs -  Physician Assistants and Nurse Practitioners) who all work together to provide you with the care you need, when you need it.  You will need a follow up appointment in 12 months  Providers on your designated Care Team:   Nicolasa Ducking, NP Eula Listen, PA-C Cadence Fransico [place], [place]  COVID-19 Vaccine Information can be found at: [email or web address] For questions related to vaccine distribution or appointments, please email [email or web address] or call [phone].

## 2023-03-02 ENCOUNTER — Ambulatory Visit: Payer: Medicare HMO | Admitting: Dermatology

## 2023-03-10 ENCOUNTER — Ambulatory Visit: Payer: Medicare HMO | Admitting: Dermatology

## 2023-04-07 ENCOUNTER — Other Ambulatory Visit: Payer: Self-pay

## 2023-04-07 MED ORDER — METOPROLOL SUCCINATE ER 100 MG PO TB24: ORAL_TABLET | ORAL | 1 refills | Status: DC

## 2023-04-07 MED ORDER — BUPROPION HCL ER (XL) 300 MG PO TB24: 300.0000 mg | ORAL_TABLET | Freq: Every day | ORAL | 1 refills | Status: DC

## 2023-04-07 NOTE — Telephone Encounter (Signed)
Refill request received from pharmacy. Warnings came up when trying to refill medication.  Medications pended and sent to Abbey Chatters, NP

## 2023-04-22 NOTE — Progress Notes (Signed)
This service is provided via telemedicine  No vital signs collected/recorded due to the encounter was a telemedicine visit.   Location of patient (ex: home, work):  Home   Patient consents to a video visit:  Yes,09/16/2021   Location of the provider (ex: office, home):  [facility]  Name of any referring provider:  Sharon Seller, NP   Names of all persons participating in the telemedicine service and their role in the encounter: Bethany B/CMA, Brentton Wardlow C.Grayce Sessions, NP  and patient  Time spent on call:  11 minutes        DATE:  04/23/2023 MRN:  [id number]  BIRTHDAY: 1946/10/04   Contact Information   None on File    Other Contacts     Name Relation Home Work Mobile   Brier S Spouse [phone]  [phone]        Code Status History     Date Active Date Inactive Code Status Order ID Comments User Context   06/30/2017 1050 06/30/2017 1453 Full Code [id number]  Antonieta Iba, MD Inpatient   08/07/2016 [phone] 06/30/2017 0702 DNR [id number] Reviewed with pt 08/07/16 [facility]        Chief Complaint  Patient presents with   Acute Visit    [name] and hurt back requesting muscle relaxer.    HISTORY OF PRESENT ILLNESS: This is a 77 year old male who had a video visit today S/P fall. He fell while getting off the boat 3 days ago, 04/20/23. He fell backwards and hit his coccyx and lumbar area. He was able to get up with help. He walks in his house but stated that he has difficulty moving due to pain. He rated his pain standing up as 9/10. He sated that he relieves the pain with Acetaminophen PRN and lays in bed in a fetal position.   Depression, recurrent (HCC) -  "feels great", takes Wellbutrin and Sertraline  Chronic systolic heart failure (HCC) -  occasional SOB, no edema, takes Spironolactone  Essential hypertension, benign -  BP when he took it was 120/65, takes hydrochlorothiazide and  Losartan  Paroxysmal tachycardia (HCC) -  denies palpitations, takes Metoprolol succinate  Insomnia, unspecified type -  takes Trazodone PRN    Patient is not able to self report vital signs due to lack of equipment at home via telehealth.  PAST MEDICAL HISTORY:  Past Medical History:  Diagnosis Date   Actinic keratosis 02/10/2021   left ear mid antihelix   Alcoholism (HCC)    per pt   Allergy    Basal cell carcinoma 05/13/2020   left prox medial pretibial    Basal cell carcinoma 08/26/2020   below the left medial knee - tx with ED&C    Basal cell carcinoma 02/10/2021   Right lower eyelid/lat canthus Moh's 08/07/2021   Basal cell carcinoma 12/17/2021   left epigastric, superficial- EDC   Basal cell carcinoma 06/03/2022   left lateral elbow, EDC 06/24/2022   Coronary artery disease    a. 06/2017 MV: EF 45%, large inf and apical defect w/o ischemia;  b. 06/2017 Cath: LM nl, LAD 30ost, LCX 50p, OM1/2 ok, RCA 100p w/ L->R collats. EF 35%.   Depression with anxiety    Gastric ulcer, acute with hemorrhage 2001   Dr. Okey Dupre, [facility]   H/O congenital atrial septal defect (ASD) repair    Heart murmur    ASD repair    Helicobacter pylori gastritis    Heme positive stool  History of gout    History of MRSA infection    History of peptic ulcer disease    had bleeding ulcers requiring transfusion   History of PSVT (paroxysmal supraventricular tachycardia)    Hyperlipidemia    Hypertension    Ischemic cardiomyopathy    a. 06/2017 LV gram: EF 35%.   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   BUPROPION (WELLBUTRIN XL) 300 MG 24 HR TABLET    Take 1 tablet (300 mg total) by mouth daily.   COLCHICINE 0.6 MG TABLET    Take 1 tablet (0.6 mg total) by mouth as needed.   EZETIMIBE (ZETIA) 10 MG TABLET    Take 1 tablet (10 mg total) by mouth daily.   HYDROCHLOROTHIAZIDE (HYDRODIURIL) 25 MG TABLET    TAKE 1 TABLET (25 MG TOTAL) BY MOUTH DAILY AS NEEDED.   ISOSORBIDE MONONITRATE (IMDUR) 30 MG 24 HR TABLET    TAKE 1/2 OF A TABLET (15 MG TOTAL) BY MOUTH DAILY   LOSARTAN (COZAAR) 25 MG TABLET    Take 1 tablet (25 mg total) by mouth daily.   METOPROLOL SUCCINATE (TOPROL-XL) 100 MG 24 HR TABLET    TAKE ONE TABLET BY MOUTH ONE TIME DAILY, TAKE WITH OR IMMEDIATELY FOLLOWING A MEAL   NITROGLYCERIN (NITROSTAT) 0.4 MG SL TABLET    Place 1 tablet (0.4 mg total) under the tongue every 5 (five) minutes as needed for chest pain.   PROPRANOLOL (INDERAL) 20 MG TABLET    TAKE 1 TABLET (20 MG TOTAL) BY MOUTH AS NEEDED (AS NEEDED FOR FAST HEART RATES).   SENNA (SENOKOT) 8.6 MG TABLET    Take 2 tablets by mouth at bedtime.   SERTRALINE (ZOLOFT) 50 MG TABLET    Take 1 tablet (50 mg total) by mouth daily.   SIMVASTATIN (ZOCOR) 40 MG TABLET    Take 1 tablet (40 mg total) by mouth daily.   SPIRONOLACTONE (ALDACTONE) 25 MG TABLET    Take 1 tablet (25 mg total) by mouth daily.   TRAZODONE (DESYREL) 50 MG TABLET    Take 1 tablet (50 mg total) by mouth at bedtime as  needed. for insomnia  Modified Medications   No medications on file  Discontinued Medications   No medications on file     Allergies  Allergen Reactions   Pollen Extract Other (See Comments)  Stuffy nose/itchy throat (Seasonal allergies)     REVIEW OF SYSTEMS:  GENERAL: no change in appetite, no fatigue, no weight changes, no fever, chills or weakness SKIN: Denies rash, itching, wounds, ulcer sores, or nail abnormality EYES: Denies change in vision, dry eyes, eye pain, itching or discharge EARS: Denies change in hearing, ringing in ears, or earache NOSE: Denies nasal congestion or epistaxis MOUTH and THROAT: Denies oral discomfort, gingival pain or bleeding, pain from teeth or hoarseness   RESPIRATORY: occasional SOB CARDIAC: no chest pain, edema or palpitations GI: no abdominal pain, diarrhea, constipation, heart burn, nausea or vomiting GU: Denies dysuria, frequency, hematuria, incontinence, or discharge MUSCULOSKELETAL: has severe lower back pains NEUROLOGICAL: Denies dizziness, syncope, numbness, or headache PSYCHIATRIC: Denies feeling of depression or anxiety. No report of hallucinations, insomnia, paranoia, or agitation      LABS/RADIOLOGY: Labs reviewed: Basic Metabolic Panel: Recent Labs    05/22/22 1018 08/24/22 1029 01/22/23 0916  NA 136 136 137  K 4.6 4.5 4.8  CL 101 102 102  CO2 26 27 27   GLUCOSE 106* 119* 120*  BUN 19 26* 23  CREATININE 1.49* 1.52* 1.51*  CALCIUM 9.4 9.6 9.7   Liver Function Tests: Recent Labs    05/22/22 1018 08/24/22 1029 01/22/23 0916  AST 18 16 18   ALT 15 14 18   BILITOT 0.6 0.5 0.4  PROT 6.3 6.5 6.3   No results for input(s): "LIPASE", "AMYLASE" in the last 8760 hours. No results for input(s): "AMMONIA" in the last 8760 hours. CBC: Recent Labs    05/22/22 1018 08/24/22 1029 01/22/23 0916  WBC 7.4 7.8 7.8  NEUTROABS 5,054 5,132 5,382  HGB 13.4 13.8 13.1*  HCT 40.1 40.6 39.3  MCV 93.7 90.6 91.6  PLT 289 268  308   A1C: Invalid input(s): "A1C" Lipid Panel: Recent Labs    05/22/22 1018 08/24/22 1029  HDL 50 53   Cardiac Enzymes: No results for input(s): "CKTOTAL", "CKMB", "CKMBINDEX", "TROPONINI" in the last 8760 hours. BNP: Invalid input(s): "POCBNP" CBG: No results for input(s): "GLUCAP" in the last 8760 hours.    No results found.  ASSESSMENT/PLAN:  1. Acute right-sided low back pain without sciatica -  continue Acetaminophen PRN -  apply warm compress TID PRN - DG Lumbar Spine Complete - DG Sacrum/Coccyx  2. Depression, recurrent (HCC) -  ood is stable -  continue Wellbutrin and Sertraline  3. Chronic systolic heart failure (HCC) -  stable -  continue Spironolactone  4. Essential hypertension, benign -  BP stable -  continue hydrochlorothiazide and Losartan  5. Paroxysmal tachycardia (HCC) -  continue Metoprolol succinate  6. Insomnia, unspecified type -  stable -  continue Trazodone PRN      Time spent on non face to face visit:   20 minutes  The patient gave consent to this video visit. Explained to the patient the risk and privacy issue that was involved with this video call.   The patient was advised to call back and ask for an in-person evaluation if the symptoms worsen or if the condition fails to improve.   Kenard Gower, NP [employer] [phone]

## 2023-04-23 ENCOUNTER — Telehealth (INDEPENDENT_AMBULATORY_CARE_PROVIDER_SITE_OTHER): Payer: Medicare HMO | Admitting: Adult Health

## 2023-04-23 ENCOUNTER — Ambulatory Visit
Admission: RE | Admit: 2023-04-23 | Discharge: 2023-04-23 | Disposition: A | Discharge status: Home / Self Care | Payer: Medicare HMO | Attending: Adult Health | Admitting: Adult Health

## 2023-04-23 ENCOUNTER — Encounter: Payer: Self-pay | Admitting: Adult Health

## 2023-04-23 ENCOUNTER — Ambulatory Visit
Admission: RE | Admit: 2023-04-23 | Discharge: 2023-04-23 | Disposition: A | Discharge status: Home / Self Care | Payer: Medicare HMO | Source: Ambulatory Visit | Attending: Adult Health | Admitting: Adult Health

## 2023-04-23 DIAGNOSIS — R29890 Loss of height: Secondary | ICD-10-CM | POA: Diagnosis not present

## 2023-04-23 DIAGNOSIS — M545 Low back pain, unspecified: Secondary | ICD-10-CM | POA: Diagnosis not present

## 2023-04-23 DIAGNOSIS — I10 Essential (primary) hypertension: Secondary | ICD-10-CM

## 2023-04-23 DIAGNOSIS — F339 Major depressive disorder, recurrent, unspecified: Secondary | ICD-10-CM | POA: Diagnosis not present

## 2023-04-23 DIAGNOSIS — M5136 Other intervertebral disc degeneration, lumbar region: Secondary | ICD-10-CM | POA: Diagnosis not present

## 2023-04-23 DIAGNOSIS — G4700 Insomnia, unspecified: Secondary | ICD-10-CM

## 2023-04-23 DIAGNOSIS — I5022 Chronic systolic (congestive) heart failure: Secondary | ICD-10-CM | POA: Diagnosis not present

## 2023-04-23 DIAGNOSIS — I479 Paroxysmal tachycardia, unspecified: Secondary | ICD-10-CM

## 2023-04-23 DIAGNOSIS — M47816 Spondylosis without myelopathy or radiculopathy, lumbar region: Secondary | ICD-10-CM | POA: Diagnosis not present

## 2023-04-23 NOTE — Progress Notes (Signed)
Patient stated that he fell on a boat and has having pain every since he when he was on vacation in [place]. The pain is in the back and he in pain right now.

## 2023-04-27 ENCOUNTER — Encounter: Payer: Self-pay | Admitting: Nurse Practitioner

## 2023-05-03 ENCOUNTER — Encounter: Payer: Self-pay | Admitting: Nurse Practitioner

## 2023-05-03 ENCOUNTER — Telehealth: Payer: Self-pay

## 2023-05-03 ENCOUNTER — Other Ambulatory Visit: Payer: Self-pay | Admitting: Adult Health

## 2023-05-03 DIAGNOSIS — M545 Low back pain, unspecified: Secondary | ICD-10-CM

## 2023-05-03 NOTE — Telephone Encounter (Signed)
Imaging center called to let us know that patients imaging is done and ready for your review!! Patient is waiting to here from you let me know when you have results ready please!!

## 2023-05-03 NOTE — Progress Notes (Signed)
Will send referral to orthopedics -  imaging showed  compression deformity of L1, an irregularity in the upper coccyx which may represent a nondisplaced fracture or variability of normal anatomy and diffused degenerative disc disease. How is the pain in the back?

## 2023-05-03 NOTE — Addendum Note (Signed)
Addended by: Kenard Gower C on: 05/03/2023 02:05 PM   Modules accepted: Orders

## 2023-05-04 DIAGNOSIS — M4856XA Collapsed vertebra, not elsewhere classified, lumbar region, initial encounter for fracture: Secondary | ICD-10-CM | POA: Diagnosis not present

## 2023-05-04 DIAGNOSIS — S322XXA Fracture of coccyx, initial encounter for closed fracture: Secondary | ICD-10-CM | POA: Diagnosis not present

## 2023-05-10 ENCOUNTER — Other Ambulatory Visit: Payer: Self-pay | Admitting: *Deleted

## 2023-05-10 MED ORDER — ISOSORBIDE MONONITRATE ER 30 MG PO TB24: ORAL_TABLET | ORAL | 1 refills | Status: DC

## 2023-05-10 NOTE — Telephone Encounter (Signed)
Pharmacy requested refill

## 2023-05-11 ENCOUNTER — Encounter: Payer: Medicare HMO | Admitting: Dermatology

## 2023-05-11 DIAGNOSIS — M4856XA Collapsed vertebra, not elsewhere classified, lumbar region, initial encounter for fracture: Secondary | ICD-10-CM | POA: Diagnosis not present

## 2023-05-21 ENCOUNTER — Other Ambulatory Visit: Payer: Self-pay | Admitting: Nurse Practitioner

## 2023-05-21 DIAGNOSIS — M545 Low back pain, unspecified: Secondary | ICD-10-CM

## 2023-05-28 DIAGNOSIS — M4856XD Collapsed vertebra, not elsewhere classified, lumbar region, subsequent encounter for fracture with routine healing: Secondary | ICD-10-CM | POA: Diagnosis not present

## 2023-06-01 DIAGNOSIS — S32010G Wedge compression fracture of first lumbar vertebra, subsequent encounter for fracture with delayed healing: Secondary | ICD-10-CM | POA: Diagnosis not present

## 2023-06-02 ENCOUNTER — Ambulatory Visit (INDEPENDENT_AMBULATORY_CARE_PROVIDER_SITE_OTHER): Payer: Medicare HMO | Admitting: Dermatology

## 2023-06-02 ENCOUNTER — Encounter: Payer: Self-pay | Admitting: Dermatology

## 2023-06-02 DIAGNOSIS — Z1283 Encounter for screening for malignant neoplasm of skin: Secondary | ICD-10-CM

## 2023-06-02 DIAGNOSIS — L570 Actinic keratosis: Secondary | ICD-10-CM

## 2023-06-02 DIAGNOSIS — C44612 Basal cell carcinoma of skin of right upper limb, including shoulder: Secondary | ICD-10-CM | POA: Diagnosis not present

## 2023-06-02 DIAGNOSIS — Z85828 Personal history of other malignant neoplasm of skin: Secondary | ICD-10-CM

## 2023-06-02 DIAGNOSIS — D492 Neoplasm of unspecified behavior of bone, soft tissue, and skin: Secondary | ICD-10-CM

## 2023-06-02 DIAGNOSIS — C44619 Basal cell carcinoma of skin of left upper limb, including shoulder: Secondary | ICD-10-CM | POA: Diagnosis not present

## 2023-06-02 DIAGNOSIS — L578 Other skin changes due to chronic exposure to nonionizing radiation: Secondary | ICD-10-CM | POA: Diagnosis not present

## 2023-06-02 DIAGNOSIS — W908XXA Exposure to other nonionizing radiation, initial encounter: Secondary | ICD-10-CM | POA: Diagnosis not present

## 2023-06-02 DIAGNOSIS — D1801 Hemangioma of skin and subcutaneous tissue: Secondary | ICD-10-CM

## 2023-06-02 DIAGNOSIS — L821 Other seborrheic keratosis: Secondary | ICD-10-CM | POA: Diagnosis not present

## 2023-06-02 DIAGNOSIS — L814 Other melanin hyperpigmentation: Secondary | ICD-10-CM

## 2023-06-02 DIAGNOSIS — D229 Melanocytic nevi, unspecified: Secondary | ICD-10-CM

## 2023-06-02 NOTE — Patient Instructions (Addendum)
 Cryotherapy Aftercare  Wash gently with soap and water everyday.   Apply Vaseline and Band-Aid daily until healed.       Wound Care Instructions  Cleanse wound gently with soap and water once a day then pat dry with clean gauze. Apply a thin coat of Petrolatum (petroleum jelly, "Vaseline") over the wound (unless you have an allergy to this). We recommend that you use a new, sterile tube of Vaseline. Do not pick or remove scabs. Do not remove the yellow or white "healing tissue" from the base of the wound.  Cover the wound with fresh, clean, nonstick gauze and secure with paper tape. You may use Band-Aids in place of gauze and tape if the wound is small enough, but would recommend trimming much of the tape off as there is often too much. Sometimes Band-Aids can irritate the skin.  You should call the office for your biopsy report after 1 week if you have not already been contacted.  If you experience any problems, such as abnormal amounts of bleeding, swelling, significant bruising, significant pain, or evidence of infection, please call the office immediately.  FOR ADULT SURGERY PATIENTS: If you need something for pain relief you may take 1 extra strength Tylenol (acetaminophen) AND 2 Ibuprofen (200mg  each) together every 4 hours as needed for pain. (do not take these if you are allergic to them or if you have a reason you should not take them.) Typically, you may only need pain medication for 1 to 3 days.          Due to recent changes in healthcare laws, you may see results of your pathology and/or laboratory studies on MyChart before the doctors have had a chance to review them. We understand that in some cases there may be results that are confusing or concerning to you. Please understand that not all results are received at the same time and often the doctors may need to interpret multiple results in order to provide you with the best plan of care or course of treatment. Therefore,  we ask that you please give [place] 2 business days to thoroughly review all your results before contacting the office for clarification. Should we see a critical lab result, you will be contacted sooner.   If You Need Anything After Your Visit  If you have any questions or concerns for your doctor, please call our main line at [phone] and press option 4 to reach your doctor's medical assistant. If no one answers, please leave a voicemail as directed and we will return your call as soon as possible. Messages left after 4 pm will be answered the following business day.   You may also send [place] a message via MyChart. We typically respond to MyChart messages within 1-2 business days.  For prescription refills, please ask your pharmacy to contact our office. Our fax number is [phone].  If you have an urgent issue when the clinic is closed that cannot wait until the next business day, you can page your doctor at the number below.    Please note that while we do our best to be available for urgent issues outside of office hours, we are not available 24/7.   If you have an urgent issue and are unable to reach [place], you may choose to seek medical care at your doctor's office, retail clinic, urgent care center, or emergency room.  If you have a medical emergency, please immediately call 911 or go to the emergency department.  Pager Numbers  - Dr. Gwen Pounds: [phone]  - Dr. Roseanne Reno: [phone]  - Dr. Katrinka Blazing: [phone]   In the event of inclement weather, please call our main line at [phone] for an update on the status of any delays or closures.  Dermatology Medication Tips: Please keep the boxes that topical medications come in in order to help keep track of the instructions about where and how to use these. Pharmacies typically print the medication instructions only on the boxes and not directly on the medication tubes.   If your medication is too expensive, please contact our  office at [phone] option 4 or send [place] a message through MyChart.   We are unable to tell what your co-pay for medications will be in advance as this is different depending on your insurance coverage. However, we may be able to find a substitute medication at lower cost or fill out paperwork to get insurance to cover a needed medication.   If a prior authorization is required to get your medication covered by your insurance company, please allow [place] 1-2 business days to complete this process.  Drug prices often vary depending on where the prescription is filled and some pharmacies may offer cheaper prices.  The website www.goodrx.com contains coupons for medications through different pharmacies. The prices here do not account for what the cost may be with help from insurance (it may be cheaper with your insurance), but the website can give you the price if you did not use any insurance.  - You can print the associated coupon and take it with your prescription to the pharmacy.  - You may also stop by our office during regular business hours and pick up a GoodRx coupon card.  - If you need your prescription sent electronically to a different pharmacy, notify our office through [facility] or by phone at [phone] option 4.     Si Usted Necesita Algo Despus de Su Visita  Tambin puede enviarnos un mensaje a travs de Clinical cytogeneticist. Por lo general respondemos a los mensajes de MyChart en el transcurso de 1 a 2 das hbiles.  Para renovar recetas, por favor pida a su farmacia que se ponga en contacto con nuestra oficina. Annie Sable de fax es [place] [phone].  Si tiene un asunto urgente cuando la clnica est cerrada y que no puede esperar hasta el siguiente da hbil, puede llamar/localizar a su doctor(a) al nmero que aparece a continuacin.   Por favor, tenga en cuenta que aunque hacemos todo lo posible para estar disponibles para asuntos urgentes fuera del horario de [place], no  estamos disponibles las 24 horas del da, los 7 [address]  [address] de la [place].   Si tiene un problema urgente y no puede comunicarse con nosotros, puede optar por buscar atencin mdica  en el consultorio de su doctor(a), en una clnica privada, en un centro de atencin urgente o en una sala de emergencias.  Si tiene Engineer, drilling, por favor llame inmediatamente al 911 o vaya a la sala de emergencias.  Nmeros de bper  - Dr. Gwen Pounds: [phone]  - Dra. Roseanne Reno: [phone]  - Dr. Katrinka Blazing: [phone]   En caso de inclemencias del tiempo, por favor llame a Lacy Duverney principal al [phone] para una actualizacin sobre el [place] de cualquier retraso o cierre.  Consejos para la medicacin en dermatologa: Por favor, guarde las cajas en las que vienen los medicamentos de uso tpico para ayudarle a seguir las instrucciones sobre dnde y cmo usarlos. [employer]  generalmente imprimen las instrucciones del medicamento slo en las cajas y no directamente en los tubos del [place].   Si su medicamento es muy caro, por favor, pngase en contacto con Rolm Gala llamando al [phone] y presione la opcin 4 o envenos un mensaje a travs de Clinical cytogeneticist.   No podemos decirle cul ser su copago por los medicamentos por adelantado ya que esto es diferente dependiendo de la cobertura de su seguro. Sin embargo, es posible que podamos encontrar un medicamento sustituto a Audiological scientist un formulario para que el seguro cubra el medicamento que se considera necesario.   Si se requiere una autorizacin previa para que su compaa de seguros [place] su medicamento, por favor permtanos de 1 a 2 das hbiles para completar [address]  Los precios de los medicamentos varan con frecuencia dependiendo del Environmental consultant de dnde se surte la receta y alguna farmacias pueden ofrecer precios ms baratos.  El sitio web www.goodrx.com tiene cupones para medicamentos de Health and safety inspector. Los precios  aqu no tienen en cuenta lo que podra costar con la ayuda del seguro (puede ser ms barato con su seguro), pero el sitio web puede darle el precio si no utiliz Tourist information centre manager.  - Puede imprimir el cupn correspondiente y llevarlo con su receta a la farmacia.  - Tambin puede pasar por nuestra oficina durante el horario de atencin regular y Education officer, museum una tarjeta de cupones de GoodRx.  - Si necesita que su receta se enve electrnicamente a una farmacia diferente, informe a nuestra oficina a travs de MyChart de [place] o por telfono llamando al [phone] y presione la opcin 4.

## 2023-06-02 NOTE — Progress Notes (Addendum)
Follow-Up Visit   Subjective  Mitchell Stephens. is a 77 y.o. male who presents for the following: Yearly Skin Cancer Screening and Full Body Skin Exam, hx of SCC, hx of BCC, patient report he has several spots that may be skin cancers.   The patient presents for Total-Body Skin Exam (TBSE) for skin cancer screening and mole check. The patient has spots, moles and lesions to be evaluated, some may be new or changing and the patient may have concern these could be cancer.   The following portions of the chart were reviewed this encounter and updated as appropriate: medications, allergies, medical history  Review of Systems:  No other skin or systemic complaints except as noted in HPI or Assessment and Plan.  Objective  Well appearing patient in no apparent distress; mood and affect are within normal limits.  A full examination was performed including scalp, head, eyes, ears, nose, lips, neck, chest, axillae, abdomen, back, buttocks, bilateral upper extremities, bilateral lower extremities, hands, feet, fingers, toes, fingernails, and toenails. All findings within normal limits unless otherwise noted below.   Relevant physical exam findings are noted in the Assessment and Plan.  Left Upper Arm 10 mm pink scaly hyperkeratotic plaque       Right Upper Arm 6 mm scar-like shiny pink papule       R sup helix x1, R forearm x 3, R dorsal hand x 6, L ear lobe x 1 (11) Erythematous thin papules/macules with gritty scale.     Assessment & Plan   SKIN CANCER SCREENING PERFORMED TODAY.  ACTINIC DAMAGE - Chronic condition, secondary to cumulative UV/sun exposure - diffuse scaly erythematous macules with underlying dyspigmentation - Recommend daily broad spectrum sunscreen SPF 30+ to sun-exposed areas, reapply every 2 hours as needed.  - Staying in the shade or wearing long sleeves, sun glasses (UVA+UVB protection) and wide brim hats (4-inch brim around the entire  circumference of the hat) are also recommended for sun protection.  - Call for new or changing lesions.  LENTIGINES, SEBORRHEIC KERATOSES, HEMANGIOMAS - Benign normal skin lesions - Benign-appearing - Call for any changes  MELANOCYTIC NEVI - Tan-brown and/or pink-flesh-colored symmetric macules and papules - Benign appearing on exam today - Observation - Call clinic for new or changing moles - Recommend daily use of broad spectrum spf 30+ sunscreen to sun-exposed areas.   HISTORY OF BASAL CELL CARCINOMA OF THE SKIN - No evidence of recurrence today - Recommend regular full body skin exams - Recommend daily broad spectrum sunscreen SPF 30+ to sun-exposed areas, reapply every 2 hours as needed.  - Call if any new or changing lesions are noted between office visits   HISTORY OF SQUAMOUS CELL CARCINOMA OF THE SKIN - No evidence of recurrence today - No lymphadenopathy - Recommend regular full body skin exams - Recommend daily broad spectrum sunscreen SPF 30+ to sun-exposed areas, reapply every 2 hours as needed.  - Call if any new or changing lesions are noted between office visits   Multiple benign nevi  Lentigines  Actinic elastosis  Seborrheic keratoses  Cherry angioma  Neoplasm of skin (2) Left Upper Arm  Skin / nail biopsy Type of biopsy: tangential   Informed consent: discussed and consent obtained   Patient was prepped and draped in usual sterile fashion: area prepped with alochol. Anesthesia: the lesion was anesthetized in a standard fashion   Anesthetic:  1% lidocaine w/ epinephrine 1-100,000 buffered w/ 8.4% NaHCO3 Instrument used: flexible razor blade  Hemostasis achieved with: pressure, aluminum chloride and electrodesiccation   Outcome: patient tolerated procedure well   Post-procedure details: wound care instructions given   Post-procedure details comment:  Ointment and small bandage  Specimen 1 - Surgical pathology Differential Diagnosis: BCC vs  SCC  Check Margins: No  Right Upper Arm  Skin / nail biopsy Type of biopsy: tangential   Informed consent: discussed and consent obtained   Patient was prepped and draped in usual sterile fashion: area prepped with alochol. 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 They are chronic with expected duration over 1 year. A portion of actinic keratoses will progress to squamous cell carcinoma of the skin. It is not possible to reliably predict which spots will progress to skin cancer and so treatment is recommended to prevent development of skin cancer.  Recommend daily broad spectrum sunscreen SPF 30+ to sun-exposed areas, reapply every 2 hours as needed.  Recommend staying in the shade or wearing long sleeves, sun glasses (UVA+UVB protection) and wide brim hats (4-inch brim around the entire circumference of the hat). Call for new or changing lesions.   Start 5FU/Calcipotriene cream apply to   Destruction of lesion - R sup helix x1, R forearm x 3, R dorsal hand x 6, L ear lobe x 1 (11) Complexity: simple   Destruction method: cryotherapy   Informed consent: discussed and consent obtained   Timeout:  patient name, date of birth, surgical site, and procedure  verified Lesion destroyed using liquid nitrogen: Yes   Region frozen until ice ball extended beyond lesion: Yes   Outcome: patient tolerated procedure well with no complications   Post-procedure details: wound care instructions given     Unaddressed neoplasm of skin Exam: Left dorsal foot scaly hyperkeratotic erythematous plaque concerning for SCC Plan: Offered biopsy. Patient thinks lesion is dormant and prefers observation  Return in about 4 months (around 10/02/2023) for AKs, Skin cancers.  IAngelique Holm, CMA, am acting as scribe for Elie Goody, MD .   Documentation: I have reviewed the above documentation for accuracy and completeness, and I agree with the above.  Elie Goody, MD

## 2023-06-08 ENCOUNTER — Telehealth: Payer: Self-pay

## 2023-06-08 NOTE — Telephone Encounter (Signed)
-----   Message from [facility] sent at 06/07/2023  8:59 PM EDT ----- Diagnosis: 1. 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 Skin , right upper arm BASAL CELL CARCINOMA, NODULAR AND INFILTRATIVE PATTERNS  Please call to share diagnosis and discuss treatment options.  Explanation: your biopsies show basal cell skin cancers in the second layer of the skin. This is the most common kind of skin cancer and is caused by damage from sun exposure. Basal cell skin cancers almost never spread beyond the skin, so they are not dangerous to your overall health. However, they will continue to grow, can bleed, cause nonhealing wounds, and disrupt nearby structures unless fully treated. Because your skin cancers have "infiltrative" patterns, they must be cut out with surgery (creams and the scrape and burn procedure will not clear them)  Treatment: Excision - you return for an hour long appointment PER SITE in our clinic where we perform a skin surgery. Patient can schedule two separate appointments or come for a two hour appointment (please tell scheduler accordingly). We numb the site of the skin cancer and a safety margin of normal skin around it. We remove the full thickness of skin and close the wound with two layers of stitches. The sample is sent to the lab to check that the skin cancer was fully removed. Return one week later to have wound checked and surface stitches removed. Surgical wound leaves a line scar. Approximately 95% cure rate. Risk of recurrence, bleeding, infection, pain, injury to nearby structures, hypertrophic scar.

## 2023-06-08 NOTE — Telephone Encounter (Signed)
Discussed pathology results with patient. Excision appointments scheduled. Patient voiced understanding.

## 2023-06-11 DIAGNOSIS — M810 Age-related osteoporosis without current pathological fracture: Secondary | ICD-10-CM | POA: Diagnosis not present

## 2023-06-23 ENCOUNTER — Encounter: Payer: Self-pay | Admitting: Dermatology

## 2023-06-23 ENCOUNTER — Telehealth: Payer: Self-pay

## 2023-06-23 ENCOUNTER — Ambulatory Visit (INDEPENDENT_AMBULATORY_CARE_PROVIDER_SITE_OTHER): Payer: Medicare HMO | Admitting: Dermatology

## 2023-06-23 VITALS — BP 132/69 | HR 73

## 2023-06-23 DIAGNOSIS — C44619 Basal cell carcinoma of skin of left upper limb, including shoulder: Secondary | ICD-10-CM

## 2023-06-23 MED ORDER — MUPIROCIN 2 % EX OINT: 1.0000 | TOPICAL_OINTMENT | Freq: Every day | CUTANEOUS | 0 refills | Status: DC

## 2023-06-23 NOTE — Progress Notes (Signed)
   Follow-Up Visit   Subjective  Mitchell Stephens. is a 77 y.o. male who presents for the following: Excision of BX proven BCC  The following portions of the chart were reviewed this encounter and updated as appropriate: medications, allergies, medical history  Review of Systems:  No other skin or systemic complaints except as noted in HPI or Assessment and Plan.  Objective  Well appearing patient in no apparent distress; mood and affect are within normal limits.  A focused examination was performed of the following areas: Left arm Relevant physical exam findings are noted in the Assessment and Plan.   Left Upper Arm Crusted bx site    Assessment & Plan   Basal cell carcinoma (BCC) of skin of left upper extremity including shoulder Left Upper Arm  Skin excision  Lesion length (cm):  0.7 Margin per side (cm):  0.4 Total excision diameter (cm):  1.5 Informed consent: discussed and consent obtained   Timeout: patient name, date of birth, surgical site, and procedure verified   Procedure prep:  Patient was prepped and draped in usual sterile fashion Prep type:  Povidone-iodine Anesthesia: the lesion was anesthetized in a standard fashion   Anesthetic:  1% lidocaine w/ epinephrine 1-100,000 buffered w/ 8.4% NaHCO3 (13 1/2 cc lido w/epi, 6 cc bupivicaine) Instrument used: #15 blade   Hemostasis achieved with: pressure and electrodesiccation   Outcome: patient tolerated procedure well with no complications   Additional details:  Tagged superior   Skin repair Complexity:  Intermediate Final length (cm):  5.7 Informed consent: discussed and consent obtained   Timeout: patient name, date of birth, surgical site, and procedure verified   Reason for type of repair: reduce tension to allow closure, reduce the risk of dehiscence, infection, and necrosis, reduce subcutaneous dead space and avoid a hematoma, allow closure of the large defect, preserve normal anatomical and  functional relationships and enhance both functionality and cosmetic results   Undermining: edges could be approximated without difficulty   Subcutaneous layers (deep stitches):  Suture size:  4-0 Suture type: Monocryl (poliglecaprone 25)   Subcutaneous suture technique: inverted dermal. Fine/surface layer approximation (top stitches):  Suture size:  5-0 Suture type: Prolene (polypropylene)   Stitches: simple interrupted   Suture removal (days):  7 Hemostasis achieved with: suture and electrodesiccation Outcome: patient tolerated procedure well with no complications   Post-procedure details: sterile dressing applied and wound care instructions given   Dressing type: pressure dressing (Mupirocin ointment)    Specimen 1 - Surgical pathology Differential Diagnosis: SUPERFICIAL BASAL CELL CARCINOMA WITH FOCAL INFILTRATION  [id number]  Check Margins: yes Crusted bx site       Return in about 1 week (around 06/30/2023) for with Dr. Katrinka Blazing, Surgery, Suture Removal.  Anise Salvo, RMA, am acting as scribe for Elie Goody, MD .   Documentation: I have reviewed the above documentation for accuracy and completeness, and I agree with the above.  Elie Goody, MD

## 2023-06-23 NOTE — Telephone Encounter (Signed)
Patient doing well following todays surgery. Butch Penny., RMA

## 2023-06-23 NOTE — Patient Instructions (Signed)
Wound Care Instructions for After Surgery  On the day following your surgery, you should begin doing daily dressing changes until your sutures are removed: Remove the bandage. Cleanse the wound gently with soap and water.  Make sure you then dry the skin surrounding the wound completely or the tape will not stick to the skin. Do not use cotton balls on the wound. After the wound is clean and dry, apply the ointment (either prescription antibiotic prescribed by your doctor or plain Vaseline if nothing was prescribed) gently with a Q-tip. If you are using a bandaid to cover: Apply a bandaid large enough to cover the entire wound. If you do not have a bandaid large enough to cover the wound OR if you are sensitive to bandaid adhesive: Cut a non-stick pad (such as Telfa) to fit the size of the wound.  Cover the wound with the non-stick pad. If the wound is draining, you may want to add a small amount of gauze on top of the non-stick pad for a little added compression to the area. Use tape to seal the area completely.  For the next 1-2 weeks: Be sure to keep the wound moist with ointment 24/7 to ensure best healing. If you are unable to cover the wound with a bandage to hold the ointment in place, you may need to reapply the ointment several times a day. Do not bend over or lift heavy items to reduce the chance of elevated blood pressure to the wound. Do not participate in particularly strenuous activities.  Below is a list of dressing supplies you might need.  Cotton-tipped applicators - Q-tips Gauze pads (2x2 and/or 4x4) - All-Purpose Sponges New and clean tube of petroleum jelly (Vaseline) OR prescription antibiotic ointment if prescribed Either a bandaid large enough to cover the entire wound OR non-stick dressing material (Telfa) and Tape (Paper or Hypafix)  FOR ADULT SURGERY PATIENTS: If you need something for pain relief, you may take 1 extra strength Tylenol (acetaminophen) and 2  ibuprofen (200 mg) together every 4 hours as needed. (Do not take these medications if you are allergic to them or if you know you cannot take them for any other reason). Typically you may only need pain medication for 1-3 days.   Comments on the Post-Operative Period Slight swelling and redness often appear around the wound. This is normal and will disappear within several days following the surgery. The healing wound will drain a brownish-red-yellow discharge during healing. This is a normal phase of wound healing. As the wound begins to heal, the drainage may increase in amount. Again, this drainage is normal. Notify us if the drainage becomes persistently bloody, excessively swollen, or intensely painful or develops a foul odor or red streaks.  The healing wound will also typically be itchy. This is normal. If you have severe or persistent pain, Notify us if the discomfort is severe or persistent. Avoid alcoholic beverages when taking pain medicine.  In Case of Wound Hemorrhage A wound hemorrhage is when the bandage suddenly becomes soaked with bright red blood and flows profusely. If this happens, sit down or lie down with your head elevated. If the wound has a dressing on it, do not remove the dressing. Apply pressure to the existing gauze. If the wound is not covered, use a gauze pad to apply pressure and continue applying the pressure for 20 minutes without peeking. DO NOT COVER THE WOUND WITH A LARGE TOWEL OR WASH CLOTH. Release your hand from the  wound site but do not remove the dressing. If the bleeding has stopped, gently clean around the wound. Leave the dressing in place for 24 hours if possible. This wait time allows the blood vessels to close off so that you do not spark a new round of bleeding by disrupting the newly clotted blood vessels with an immediate dressing change. If the bleeding does not subside, continue to hold pressure for 40 minutes. If bleeding continues, page your  physician, contact an After Hours clinic or go to the Emergency Room.  Due to recent changes in healthcare laws, you may see results of your pathology and/or laboratory studies on MyChart before the doctors have had a chance to review them. We understand that in some cases there may be results that are confusing or concerning to you. Please understand that not all results are received at the same time and often the doctors may need to interpret multiple results in order to provide you with the best plan of care or course of treatment. Therefore, we ask that you please give [place] 2 business days to thoroughly review all your results before contacting the office for clarification. Should we see a critical lab result, you will be contacted sooner.   If You Need Anything After Your Visit  If you have any questions or concerns for your doctor, please call our main line at [phone] and press option 4 to reach your doctor's medical assistant. If no one answers, please leave a voicemail as directed and we will return your call as soon as possible. Messages left after 4 pm will be answered the following business day.   You may also send [place] a message via MyChart. We typically respond to MyChart messages within 1-2 business days.  For prescription refills, please ask your pharmacy to contact our office. Our fax number is [phone].  If you have an urgent issue when the clinic is closed that cannot wait until the next business day, you can page your doctor at the number below.    Please note that while we do our best to be available for urgent issues outside of office hours, we are not available 24/7.   If you have an urgent issue and are unable to reach [place], you may choose to seek medical care at your doctor's office, retail clinic, urgent care center, or emergency room.  If you have a medical emergency, please immediately call 911 or go to the emergency department.  Pager Numbers  - Dr. Gwen Pounds:  [phone]  - Dr. Roseanne Reno: [phone]  - Dr. Katrinka Blazing: [phone]   In the event of inclement weather, please call our main line at [phone] for an update on the status of any delays or closures.  Dermatology Medication Tips: Please keep the boxes that topical medications come in in order to help keep track of the instructions about where and how to use these. Pharmacies typically print the medication instructions only on the boxes and not directly on the medication tubes.   If your medication is too expensive, please contact our office at [phone] option 4 or send [place] a message through MyChart.   We are unable to tell what your co-pay for medications will be in advance as this is different depending on your insurance coverage. However, we may be able to find a substitute medication at lower cost or fill out paperwork to get insurance to cover a needed medication.   If a prior authorization is required to get your medication covered  by your insurance company, please allow [place] 1-2 business days to complete this process.  Drug prices often vary depending on where the prescription is filled and some pharmacies may offer cheaper prices.  The website www.goodrx.com contains coupons for medications through different pharmacies. The prices here do not account for what the cost may be with help from insurance (it may be cheaper with your insurance), but the website can give you the price if you did not use any insurance.  - You can print the associated coupon and take it with your prescription to the pharmacy.  - You may also stop by our office during regular business hours and pick up a GoodRx coupon card.  - If you need your prescription sent electronically to a different pharmacy, notify our office through [facility] or by phone at [phone] option 4.     Si Usted Necesita Algo Despus de Su Visita  Tambin puede enviarnos un mensaje a travs de Clinical cytogeneticist. Por lo general  respondemos a los mensajes de MyChart en el transcurso de 1 a 2 das hbiles.  Para renovar recetas, por favor pida a su farmacia que se ponga en contacto con nuestra oficina. Annie Sable de fax es [place] [phone].  Si tiene un asunto urgente cuando la clnica est cerrada y que no puede esperar hasta el siguiente da hbil, puede llamar/localizar a su doctor(a) al nmero que aparece a continuacin.   Por favor, tenga en cuenta que aunque hacemos todo lo posible para estar disponibles para asuntos urgentes fuera del horario de [place], no estamos disponibles las 24 horas del da, los 7 [address]  [address] de la [place].   Si tiene un problema urgente y no puede comunicarse con nosotros, puede optar por buscar atencin mdica  en el consultorio de su doctor(a), en una clnica privada, en un centro de atencin urgente o en una sala de emergencias.  Si tiene Engineer, drilling, por favor llame inmediatamente al 911 o vaya a la sala de emergencias.  Nmeros de bper  - Dr. Gwen Pounds: [phone]  - Dra. Roseanne Reno: [phone]  - Dr. Katrinka Blazing: [phone]   En caso de inclemencias del tiempo, por favor llame a Lacy Duverney principal al [phone] para una actualizacin sobre el [place] de cualquier retraso o cierre.  Consejos para la medicacin en dermatologa: Por favor, guarde las cajas en las que vienen los medicamentos de uso tpico para ayudarle a seguir las instrucciones sobre dnde y cmo usarlos. Las farmacias generalmente imprimen las instrucciones del medicamento slo en las cajas y no directamente en los tubos del Agency.   Si su medicamento es muy caro, por favor, pngase en contacto con Rolm Gala llamando al [phone] y presione la opcin 4 o envenos un mensaje a travs de Clinical cytogeneticist.   No podemos decirle cul ser su copago por los medicamentos por adelantado ya que esto es diferente dependiendo de la cobertura de su seguro. Sin embargo, es posible que podamos encontrar un  medicamento sustituto a Audiological scientist un formulario para que el seguro cubra el medicamento que se considera necesario.   Si se requiere una autorizacin previa para que su compaa de seguros [place] su medicamento, por favor permtanos de 1 a 2 das hbiles para completar [address]  Los precios de los medicamentos varan con frecuencia dependiendo del Environmental consultant de dnde se surte la receta y alguna farmacias pueden ofrecer precios ms baratos.  El sitio web www.goodrx.com tiene cupones para medicamentos de Health and safety inspector. Los precios aqu  no tienen en cuenta lo que podra costar con la ayuda del seguro (puede ser ms barato con su seguro), pero el sitio web puede darle el precio si no Visual merchandiser.  - Puede imprimir el cupn correspondiente y llevarlo con su receta a la farmacia.  - Tambin puede pasar por nuestra oficina durante el horario de atencin regular y Education officer, museum una tarjeta de cupones de GoodRx.  - Si necesita que su receta se enve electrnicamente a una farmacia diferente, informe a nuestra oficina a travs de MyChart de [place] o por telfono llamando al [phone] y presione la opcin 4.

## 2023-06-28 LAB — SURGICAL PATHOLOGY

## 2023-06-30 ENCOUNTER — Ambulatory Visit: Payer: Medicare HMO | Admitting: Dermatology

## 2023-06-30 ENCOUNTER — Encounter: Payer: Self-pay | Admitting: Dermatology

## 2023-06-30 DIAGNOSIS — C44612 Basal cell carcinoma of skin of right upper limb, including shoulder: Secondary | ICD-10-CM | POA: Diagnosis not present

## 2023-06-30 DIAGNOSIS — Z4802 Encounter for removal of sutures: Secondary | ICD-10-CM

## 2023-06-30 NOTE — Progress Notes (Signed)
Follow-Up Visit   Subjective  Mitchell Stephens. is a 77 y.o. male who presents for the following: Excision of nodular BCC at right upper arm. Bx proven 06/02/2023.  Suture removal at left upper arm.  The following portions of the chart were reviewed this encounter and updated as appropriate: medications, allergies, medical history  Review of Systems:  No other skin or systemic complaints except as noted in HPI or Assessment and Plan.  Objective  Well appearing patient in no apparent distress; mood and affect are within normal limits.  A focused examination was performed of the following areas: B/L arms   Relevant physical exam findings are noted in the Assessment and Plan.   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 Right Upper Arm - Anterior  Skin excision  Lesion length (cm):  0.8 Margin per side (cm):  0.4 Total excision diameter (cm):  1.6 Informed consent: discussed and consent obtained   Timeout: patient name, date of birth, surgical site, and procedure verified   Procedure prep:  Patient was prepped and draped in usual sterile fashion Prep type:  Chlorhexidine Anesthesia: the lesion was anesthetized in a standard fashion   Anesthetic:  1% lidocaine w/ epinephrine 1-100,000 buffered w/ 8.4% NaHCO3 (26 cc lido/epi, 6 cc bupivicaine/epi) Instrument used: #15 blade   Hemostasis achieved with: pressure and electrodesiccation   Outcome: patient tolerated procedure well with no complications   Additional details:  Tagged superior  Skin repair Complexity:  Intermediate Final length (cm):  6.2 Informed consent: discussed and consent obtained   Timeout: patient name, date of birth, surgical site, and procedure verified   Procedure prep:  Patient was prepped and draped in usual sterile fashion Prep type:  Chlorhexidine Anesthesia: the  lesion was anesthetized in a standard fashion   Anesthetic:  1% lidocaine w/ epinephrine 1-100,000 buffered w/ 8.4% NaHCO3 Reason for type of repair: reduce tension to allow closure, reduce the risk of dehiscence, infection, and necrosis, reduce subcutaneous dead space and avoid a hematoma, allow closure of the large defect and preserve normal anatomy   Undermining: edges could be approximated without difficulty   Subcutaneous layers (deep stitches):  Suture size:  4-0 Suture type: Monocryl (poliglecaprone 25)   Stitches:  Buried vertical mattress Fine/surface layer approximation (top stitches):  Suture size:  5-0 Suture type: Prolene (polypropylene)   Stitches: simple running   Suture removal (days):  7 Hemostasis achieved with: suture, pressure and electrodesiccation Outcome: patient tolerated procedure well with no complications   Post-procedure details: sterile dressing applied and wound care instructions given   Dressing type: petrolatum, bandage and pressure dressing    Specimen 1 - Surgical pathology Differential Diagnosis: R/O residual nodular and infiltrative BCC  Check Margins: yes Previous Bx: [id number] Tagged superior  Apply Mupirocin ointment once daily with bandage change.   Encounter for Removal of Sutures - Incision site at the left upper arm is clean, dry and intact - Wound cleansed, sutures removed, wound cleansed and steri strips applied.  - Discussed pathology results showing no residual BCC  - Patient advised to keep steri-strips dry until they fall off. - Scars remodel for a full year. - Once steri-strips fall off, patient can apply over-the-counter silicone scar cream each night to help with scar remodeling if desired. - Patient advised to call with any concerns or if they notice  any new or changing lesions.    Return in about 1 week (around 07/07/2023) for Suture Removal.  I, Lawson Radar, CMA, am acting as scribe for Elie Goody,  MD.   Documentation: I have reviewed the above documentation for accuracy and completeness, and I agree with the above.  Elie Goody, MD

## 2023-06-30 NOTE — Patient Instructions (Addendum)
Wound Care Instructions for After Surgery  On the day following your surgery, you should begin doing daily dressing changes until your sutures are removed: Remove the bandage. Cleanse the wound gently with soap and water.  Make sure you then dry the skin surrounding the wound completely or the tape will not stick to the skin. Do not use cotton balls on the wound. After the wound is clean and dry, apply the ointment (either prescription antibiotic prescribed by your doctor or plain Vaseline if nothing was prescribed) gently with a Q-tip. If you are using a bandaid to cover: Apply a bandaid large enough to cover the entire wound. If you do not have a bandaid large enough to cover the wound OR if you are sensitive to bandaid adhesive: Cut a non-stick pad (such as Telfa) to fit the size of the wound.  Cover the wound with the non-stick pad. If the wound is draining, you may want to add a small amount of gauze on top of the non-stick pad for a little added compression to the area. Use tape to seal the area completely.  For the next 1-2 weeks: Be sure to keep the wound moist with ointment 24/7 to ensure best healing. If you are unable to cover the wound with a bandage to hold the ointment in place, you may need to reapply the ointment several times a day. Do not bend over or lift heavy items to reduce the chance of elevated blood pressure to the wound. Do not participate in particularly strenuous activities.  Below is a list of dressing supplies you might need.  Cotton-tipped applicators - Q-tips Gauze pads (2x2 and/or 4x4) - All-Purpose Sponges New and clean tube of petroleum jelly (Vaseline) OR prescription antibiotic ointment if prescribed Either a bandaid large enough to cover the entire wound OR non-stick dressing material (Telfa) and Tape (Paper or Hypafix)  FOR ADULT SURGERY PATIENTS: If you need something for pain relief, you may take 1 extra strength Tylenol (acetaminophen) and 2  ibuprofen (200 mg) together every 4 hours as needed. (Do not take these medications if you are allergic to them or if you know you cannot take them for any other reason). Typically you may only need pain medication for 1-3 days.   Comments on the Post-Operative Period Slight swelling and redness often appear around the wound. This is normal and will disappear within several days following the surgery. The healing wound will drain a brownish-red-yellow discharge during healing. This is a normal phase of wound healing. As the wound begins to heal, the drainage may increase in amount. Again, this drainage is normal. Notify us if the drainage becomes persistently bloody, excessively swollen, or intensely painful or develops a foul odor or red streaks.  The healing wound will also typically be itchy. This is normal. If you have severe or persistent pain, Notify us if the discomfort is severe or persistent. Avoid alcoholic beverages when taking pain medicine.  In Case of Wound Hemorrhage A wound hemorrhage is when the bandage suddenly becomes soaked with bright red blood and flows profusely. If this happens, sit down or lie down with your head elevated. If the wound has a dressing on it, do not remove the dressing. Apply pressure to the existing gauze. If the wound is not covered, use a gauze pad to apply pressure and continue applying the pressure for 20 minutes without peeking. DO NOT COVER THE WOUND WITH A LARGE TOWEL OR WASH CLOTH. Release your hand from the  wound site but do not remove the dressing. If the bleeding has stopped, gently clean around the wound. Leave the dressing in place for 24 hours if possible. This wait time allows the blood vessels to close off so that you do not spark a new round of bleeding by disrupting the newly clotted blood vessels with an immediate dressing change. If the bleeding does not subside, continue to hold pressure for 40 minutes. If bleeding continues, page your  physician, contact an After Hours clinic or go to the Emergency Room.  Due to recent changes in healthcare laws, you may see results of your pathology and/or laboratory studies on MyChart before the doctors have had a chance to review them. We understand that in some cases there may be results that are confusing or concerning to you. Please understand that not all results are received at the same time and often the doctors may need to interpret multiple results in order to provide you with the best plan of care or course of treatment. Therefore, we ask that you please give [place] 2 business days to thoroughly review all your results before contacting the office for clarification. Should we see a critical lab result, you will be contacted sooner.   If You Need Anything After Your Visit  If you have any questions or concerns for your doctor, please call our main line at [phone] and press option 4 to reach your doctor's medical assistant. If no one answers, please leave a voicemail as directed and we will return your call as soon as possible. Messages left after 4 pm will be answered the following business day.   You may also send [place] a message via MyChart. We typically respond to MyChart messages within 1-2 business days.  For prescription refills, please ask your pharmacy to contact our office. Our fax number is [phone].  If you have an urgent issue when the clinic is closed that cannot wait until the next business day, you can page your doctor at the number below.    Please note that while we do our best to be available for urgent issues outside of office hours, we are not available 24/7.   If you have an urgent issue and are unable to reach [place], you may choose to seek medical care at your doctor's office, retail clinic, urgent care center, or emergency room.  If you have a medical emergency, please immediately call 911 or go to the emergency department.  Pager Numbers  - Dr. Gwen Pounds:  [phone]  - Dr. Roseanne Reno: [phone]  - Dr. Katrinka Blazing: [phone]   In the event of inclement weather, please call our main line at [phone] for an update on the status of any delays or closures.  Dermatology Medication Tips: Please keep the boxes that topical medications come in in order to help keep track of the instructions about where and how to use these. Pharmacies typically print the medication instructions only on the boxes and not directly on the medication tubes.   If your medication is too expensive, please contact our office at [phone] option 4 or send [place] a message through MyChart.   We are unable to tell what your co-pay for medications will be in advance as this is different depending on your insurance coverage. However, we may be able to find a substitute medication at lower cost or fill out paperwork to get insurance to cover a needed medication.   If a prior authorization is required to get your medication covered  by your insurance company, please allow [place] 1-2 business days to complete this process.  Drug prices often vary depending on where the prescription is filled and some pharmacies may offer cheaper prices.  The website www.goodrx.com contains coupons for medications through different pharmacies. The prices here do not account for what the cost may be with help from insurance (it may be cheaper with your insurance), but the website can give you the price if you did not use any insurance.  - You can print the associated coupon and take it with your prescription to the pharmacy.  - You may also stop by our office during regular business hours and pick up a GoodRx coupon card.  - If you need your prescription sent electronically to a different pharmacy, notify our office through [facility] or by phone at [phone] option 4.     Si Usted Necesita Algo Despus de Su Visita  Tambin puede enviarnos un mensaje a travs de Clinical cytogeneticist. Por lo general  respondemos a los mensajes de MyChart en el transcurso de 1 a 2 das hbiles.  Para renovar recetas, por favor pida a su farmacia que se ponga en contacto con nuestra oficina. Annie Sable de fax es [place] [phone].  Si tiene un asunto urgente cuando la clnica est cerrada y que no puede esperar hasta el siguiente da hbil, puede llamar/localizar a su doctor(a) al nmero que aparece a continuacin.   Por favor, tenga en cuenta que aunque hacemos todo lo posible para estar disponibles para asuntos urgentes fuera del horario de [place], no estamos disponibles las 24 horas del da, los 7 [address]  [address] de la [place].   Si tiene un problema urgente y no puede comunicarse con nosotros, puede optar por buscar atencin mdica  en el consultorio de su doctor(a), en una clnica privada, en un centro de atencin urgente o en una sala de emergencias.  Si tiene Engineer, drilling, por favor llame inmediatamente al 911 o vaya a la sala de emergencias.  Nmeros de bper  - Dr. Gwen Pounds: [phone]  - Dra. Roseanne Reno: [phone]  - Dr. Katrinka Blazing: [phone]   En caso de inclemencias del tiempo, por favor llame a Lacy Duverney principal al [phone] para una actualizacin sobre el [place] de cualquier retraso o cierre.  Consejos para la medicacin en dermatologa: Por favor, guarde las cajas en las que vienen los medicamentos de uso tpico para ayudarle a seguir las instrucciones sobre dnde y cmo usarlos. Las farmacias generalmente imprimen las instrucciones del medicamento slo en las cajas y no directamente en los tubos del [place].   Si su medicamento es muy caro, por favor, pngase en contacto con Rolm Gala llamando al [phone] y presione la opcin 4 o envenos un mensaje a travs de Clinical cytogeneticist.   No podemos decirle cul ser su copago por los medicamentos por adelantado ya que esto es diferente dependiendo de la cobertura de su seguro. Sin embargo, es posible que podamos encontrar un  medicamento sustituto a Audiological scientist un formulario para que el seguro cubra el medicamento que se considera necesario.   Si se requiere una autorizacin previa para que su compaa de seguros [place] su medicamento, por favor permtanos de 1 a 2 das hbiles para completar [address]  Los precios de los medicamentos varan con frecuencia dependiendo del Environmental consultant de dnde se surte la receta y alguna farmacias pueden ofrecer precios ms baratos.  El sitio web www.goodrx.com tiene cupones para medicamentos de Health and safety inspector. Los precios aqu  no tienen en cuenta lo que podra costar con la ayuda del seguro (puede ser ms barato con su seguro), pero el sitio web puede darle el precio si no Visual merchandiser.  - Puede imprimir el cupn correspondiente y llevarlo con su receta a la farmacia.  - Tambin puede pasar por nuestra oficina durante el horario de atencin regular y Education officer, museum una tarjeta de cupones de GoodRx.  - Si necesita que su receta se enve electrnicamente a una farmacia diferente, informe a nuestra oficina a travs de MyChart de [place] o por telfono llamando al [phone] y presione la opcin 4.

## 2023-07-01 ENCOUNTER — Telehealth: Payer: Self-pay

## 2023-07-01 NOTE — Telephone Encounter (Signed)
Called patient to check on him after surgery yesterday. N/A. LMOVM to C/B if any problems.

## 2023-07-02 DIAGNOSIS — S32010D Wedge compression fracture of first lumbar vertebra, subsequent encounter for fracture with routine healing: Secondary | ICD-10-CM | POA: Diagnosis not present

## 2023-07-02 DIAGNOSIS — S32010G Wedge compression fracture of first lumbar vertebra, subsequent encounter for fracture with delayed healing: Secondary | ICD-10-CM | POA: Diagnosis not present

## 2023-07-02 LAB — SURGICAL PATHOLOGY

## 2023-07-05 ENCOUNTER — Telehealth: Payer: Self-pay

## 2023-07-05 ENCOUNTER — Other Ambulatory Visit: Payer: Self-pay | Admitting: Nurse Practitioner

## 2023-07-05 NOTE — Telephone Encounter (Signed)
Called patient. N/A. LMOM to C/B for pathology results and surgical wound update.

## 2023-07-05 NOTE — Telephone Encounter (Signed)
-----   Message from [place] sent at 07/02/2023  3:58 PM EDT ----- Diagnosis Skin (M), right upper arm- anterior :       RESIDUAL BASAL CELL CARCINOMA, MARGINS FREE    Please call to share that excision removed the remaining basal cell skin cancer and margins were clear. please get update on surgical wound. Thank you.

## 2023-07-06 ENCOUNTER — Other Ambulatory Visit: Payer: Self-pay

## 2023-07-06 DIAGNOSIS — I10 Essential (primary) hypertension: Secondary | ICD-10-CM

## 2023-07-06 MED ORDER — LOSARTAN POTASSIUM 25 MG PO TABS
25.0000 mg | ORAL_TABLET | Freq: Every day | ORAL | 1 refills | Status: DC
Start: 2023-07-06 — End: 2023-10-04

## 2023-07-06 NOTE — Telephone Encounter (Signed)
High risk or very high risk warning populated when attempting to refill medication. RX request sent to PCP for review and approval if warranted.

## 2023-07-06 NOTE — Telephone Encounter (Signed)
Pharmacy requested refill Pended Rx's and sent to [name] for approval due to HIGH ALERT Warning.  

## 2023-07-07 ENCOUNTER — Telehealth: Payer: Self-pay | Admitting: Cardiovascular Disease

## 2023-07-07 ENCOUNTER — Encounter: Payer: Self-pay | Admitting: Dermatology

## 2023-07-07 ENCOUNTER — Encounter: Payer: Self-pay | Admitting: Cardiology

## 2023-07-07 ENCOUNTER — Ambulatory Visit: Payer: Medicare HMO | Admitting: Dermatology

## 2023-07-07 DIAGNOSIS — Z4802 Encounter for removal of sutures: Secondary | ICD-10-CM

## 2023-07-07 DIAGNOSIS — Z5189 Encounter for other specified aftercare: Secondary | ICD-10-CM

## 2023-07-07 NOTE — Telephone Encounter (Signed)
*  STAT* If patient is at the pharmacy, call can be transferred to refill team.   1. Which medications need to be refilled? (please list name of each medication and dose if known) ezetimibe (ZETIA) 10 MG tablet  2. Which pharmacy/location (including street and city if local pharmacy) is medication to be sent to? [facility] [address] - [place], [place] [address]  3. Do they need a 30 day or 90 day supply? [place]

## 2023-07-07 NOTE — Progress Notes (Signed)
   Follow-Up Visit   Subjective  Mitchell Stephens. is a 77 y.o. male who presents for the following: Suture removal  Pathology showed margins clear  The following portions of the chart were reviewed this encounter and updated as appropriate: medications, allergies, medical history  Review of Systems:  No other skin or systemic complaints except as noted in HPI or Assessment and Plan.  Objective  Well appearing patient in no apparent distress; mood and affect are within normal limits.  Areas Examined: Right upper arm Relevant physical exam findings are noted in the Assessment and Plan.    Assessment & Plan   Encounter for removal of sutures  Visit for wound check   Encounter for Removal of Sutures - Incision site is clean, dry and intact. - Wound cleansed, sutures removed, wound cleansed and steri strips applied.  - Discussed pathology results showing margins clear - Patient advised to keep steri-strips dry until they fall off. - Scars remodel for a full year. - Once steri-strips fall off, patient can apply over-the-counter silicone scar cream once to twice a day to help with scar remodeling if desired. - Patient advised to call with any concerns or if they notice any new or changing lesions. - explained that "residual basal cell carcinoma" in the report means the excision had additional BCC in it (which is why we do excisions after biopsies), but that the [facility] was fully removed (which is denoted by "margins free"). The excision on the left arm had "no residual basal cell carcinoma" which means the [facility] was cleared by the biopsy. It still states that margins were free, meaning the cancer was cleared. So both sites were cleared of BCC. Patient did not agree with this interpretation, was agitated, and had an aggressive tone of voice. I offered to have Dr Gwen Pounds come in and explain, but patient declined. After I, Dr Katrinka Blazing, left the room, the patient questioned Annice Pih about the  excision pathology result and was rude/dismissive of her explanation. Patient has been rude to [place] during every encounter (06/02/23, 06/23/23, 06/30/23, 07/07/23). Lawson Radar has been a witness to the rudeness and aggressive tone. Patient does not seem to have a therapeutic alliance with me as a provider. I do not feel respected or believed. Patient should return to Dr Gwen Pounds for further dermatologic care.  Return for as scheduled.  Anise Salvo, RMA, am acting as scribe for Elie Goody, MD .   Documentation: I have reviewed the above documentation for accuracy and completeness, and I agree with the above.  Elie Goody, MD

## 2023-07-07 NOTE — Telephone Encounter (Signed)
Error

## 2023-07-07 NOTE — Telephone Encounter (Signed)
Please advise if ok to refill medication under Dr. Mariah Milling as the prescribing provider. Medications recently have been filled by PCP. Thank you so much.

## 2023-07-08 MED ORDER — EZETIMIBE 10 MG PO TABS: 10.0000 mg | ORAL_TABLET | Freq: Every day | ORAL | 1 refills | Status: DC

## 2023-07-26 ENCOUNTER — Encounter: Payer: Self-pay | Admitting: Nurse Practitioner

## 2023-07-26 ENCOUNTER — Ambulatory Visit (INDEPENDENT_AMBULATORY_CARE_PROVIDER_SITE_OTHER): Payer: Medicare HMO | Admitting: Nurse Practitioner

## 2023-07-26 VITALS — BP 106/72 | HR 74 | Temp 97.5°F | Resp 17 | Ht 71.85 in | Wt 221.8 lb

## 2023-07-26 DIAGNOSIS — M1A9XX0 Chronic gout, unspecified, without tophus (tophi): Secondary | ICD-10-CM | POA: Diagnosis not present

## 2023-07-26 DIAGNOSIS — R600 Localized edema: Secondary | ICD-10-CM

## 2023-07-26 DIAGNOSIS — I10 Essential (primary) hypertension: Secondary | ICD-10-CM

## 2023-07-26 DIAGNOSIS — K219 Gastro-esophageal reflux disease without esophagitis: Secondary | ICD-10-CM

## 2023-07-26 DIAGNOSIS — I25118 Atherosclerotic heart disease of native coronary artery with other forms of angina pectoris: Secondary | ICD-10-CM

## 2023-07-26 DIAGNOSIS — Z23 Encounter for immunization: Secondary | ICD-10-CM | POA: Diagnosis not present

## 2023-07-26 DIAGNOSIS — E782 Mixed hyperlipidemia: Secondary | ICD-10-CM

## 2023-07-26 DIAGNOSIS — G4733 Obstructive sleep apnea (adult) (pediatric): Secondary | ICD-10-CM

## 2023-07-26 DIAGNOSIS — K5909 Other constipation: Secondary | ICD-10-CM

## 2023-07-26 DIAGNOSIS — I479 Paroxysmal tachycardia, unspecified: Secondary | ICD-10-CM | POA: Diagnosis not present

## 2023-07-26 DIAGNOSIS — R739 Hyperglycemia, unspecified: Secondary | ICD-10-CM | POA: Diagnosis not present

## 2023-07-26 NOTE — Progress Notes (Unsigned)
Careteam: Patient Care Team: Sharon Seller, NP as PCP - General (Geriatric Medicine) [place], Peter M, MD as PCP - Cardiology (Cardiology) Satira Mccallum, [place] as Referring Physician (Dermatology) Bud Face, MD as Referring Physician (Otolaryngology) Nevada Crane, MD as Consulting Physician (Ophthalmology)  PLACE OF SERVICE:  [facility]  Advanced Directive information Does Patient Have a Medical Advance Directive?: Yes, Type of Advance Directive: Living will, Does patient want to make changes to medical advance directive?: No - Patient declined  Allergies  Allergen Reactions   Pollen Extract Other (See Comments)    Stuffy nose/itchy throat (Seasonal allergies)    Chief Complaint  Patient presents with   Medical Management of Chronic Issues    6 month follow up.   Immunizations    Discuss the need for DTAP vaccine, Influenza vaccine, and Covid Booster.     HPI: Patient is a 77 y.o. male presents for a 6 month follow-up.  Suffered compression fracture to L1 after a fall in July, followed by neurosurgery, reports back is beginning to heal, had severe compression fracture. Wearing a brace, no bending/twisting. Reports bilateral leg edema after fall, worse on left, denies redness/warmth. No pain.   Unable to stand for long periods of time due to back spasms, using a cane, can't walk long. Has appointment nov 21 with neurosurgery. Taking PRN tylenol but doesn't help, also taking 5mg  vicodin twice a day but trying to cut back. Topical analgesics don't help.   Has had a cough that has been going on for a couple of months. Throughout the whole day, worse after he eats. Also reports having more indigestion over the last 1-2 months, taking more Tums.   Sees cardiology. Denies chest pain, palpitations.   Review of Systems:  Review of Systems  Constitutional:  Negative for chills, fever, malaise/fatigue and weight loss.  Respiratory:  Positive for cough and shortness  of breath (with exertion).   Cardiovascular:  Positive for leg swelling (left greater than right). Negative for chest pain and palpitations.  Gastrointestinal:  Positive for constipation and heartburn. Negative for diarrhea, nausea and vomiting.  Genitourinary:  Positive for frequency. Negative for dysuria and urgency.  Musculoskeletal:  Positive for back pain.  Neurological:  Negative for dizziness, weakness and headaches.  Psychiatric/Behavioral:  Negative for depression. The patient is not nervous/anxious and does not have insomnia.     Past Medical History:  Diagnosis Date   Actinic keratosis 02/10/2021   left ear mid antihelix   Alcoholism (HCC)    per pt   Allergy    Basal cell carcinoma 05/13/2020   left prox medial pretibial    Basal cell carcinoma 08/26/2020   below the left medial knee - tx with ED&C    Basal cell carcinoma 02/10/2021   Right lower eyelid/lat canthus Moh's 08/07/2021   Basal cell carcinoma 12/17/2021   left epigastric, superficial- EDC   Basal cell carcinoma 06/03/2022   left lateral elbow, EDC 06/24/2022   Basal cell carcinoma 06/02/2023   Left upper arm. Superficial and focal infiltration. Excised 06/23/23   Basal cell carcinoma 06/02/2023   Right upper arm. Nodular and infiltrative. Excision pending.   Coronary artery disease    a. 06/2017 MV: EF 45%, large inf and apical defect w/o ischemia;  b. 06/2017 Cath: LM nl, LAD 30ost, LCX 50p, OM1/2 ok, RCA 100p w/ L->R collats. EF 35%.   Depression with anxiety    Gastric ulcer, acute with hemorrhage 2001   Dr. Okey Dupre,  [facility]   H/O congenital atrial septal defect (ASD) repair    Heart murmur    ASD repair    Helicobacter pylori gastritis    Heme positive stool    History of gout    History of MRSA infection    History of peptic ulcer disease    had bleeding ulcers requiring transfusion   History of PSVT (paroxysmal supraventricular tachycardia)    Hyperlipidemia    Hypertension    Ischemic  cardiomyopathy    a. 06/2017 LV gram: EF 35%.   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   eft chest parasternal sup, txt'd EDC   Squamous cell carcinoma of skin 01/29/2020   right clavicle   Squamous cell carcinoma of skin 08/13/2021   R lat pectoral - EDC 09/24/2021   Squamous cell carcinoma of skin 11/20/2021   L chest parasternal sup - tx with ED&C   Squamous cell carcinoma of skin 11/20/2021   L chest parasternal inf - tx with ED&C 12/17/21   Squamous cell carcinoma of skin 11/20/2021   R Xyphoid - tx with [facility] 12/17/21   Tobacco abuse    Past Surgical History:  Procedure Laterality Date   ASD REPAIR, OSTIUM PRIMUM  1966   basal cell carcinoma removal     COLONOSCOPY     EAR BIOPSY Left 2013   Dr.Cook--squamous cell ca ([facility])   LEFT HEART CATH AND CORONARY ANGIOGRAPHY Left 06/30/2017   Procedure: LEFT HEART CATH AND CORONARY ANGIOGRAPHY;  Surgeon: Antonieta Iba, MD;  Location: [facility] INVASIVE CV LAB;  Service: Cardiovascular;  Laterality: Left;   MOHS SURGERY     ear   open heart surgery  1966   Dr. Maple Hudson ([facility]); was PFO repair--has been normal in f/u   PALATE / UVULA BIOPSY / EXCISION  2000   [facility])   TONSILLECTOMY AND ADENOIDECTOMY  1959   TOOTH EXTRACTION  01/06/2023   Dentist   UVULECTOMY     Social History:   reports that he quit smoking about 8 years ago. His smoking use included cigarettes. He started smoking about 48 years ago. He has never used smokeless tobacco. He reports that he does not drink alcohol and does not use drugs.  Family History  Problem Relation Age of Onset   Failure to thrive Mother    Arthritis Mother    Other Mother         Colectomy   Brain cancer Father    Hypertension Sister    Brain cancer Sister    Other Sister        Blastoma   Stomach cancer Maternal Grandmother    Cancer Paternal Grandfather        type unknown   Liver cancer Paternal Uncle    Other Other        mother's siblings died of cancer ( abdominal)   Colon cancer Neg Hx    Colon polyps Neg Hx    Esophageal cancer Neg Hx    Rectal cancer Neg Hx     Medications: Patient's Medications  New Prescriptions   No medications on file  Previous  Medications   ASPIRIN EC 81 MG TABLET    Take 81 mg by mouth daily.   BUPROPION (WELLBUTRIN XL) 300 MG 24 HR TABLET    TAKE ONE TABLET BY MOUTH ONE TIME DAILY   COLCHICINE 0.6 MG TABLET    Take 1 tablet (0.6 mg total) by mouth as needed.   EZETIMIBE (ZETIA) 10 MG TABLET    Take 1 tablet (10 mg total) by mouth daily.   HYDROCHLOROTHIAZIDE (HYDRODIURIL) 25 MG TABLET    TAKE 1 TABLET (25 MG TOTAL) BY MOUTH DAILY AS NEEDED.   ISOSORBIDE MONONITRATE (IMDUR) 30 MG 24 HR TABLET    TAKE 1/2 OF A TABLET (15 MG TOTAL) BY MOUTH DAILY   LOSARTAN (COZAAR) 25 MG TABLET    Take 1 tablet (25 mg total) by mouth daily.   METOPROLOL SUCCINATE (TOPROL-XL) 100 MG 24 HR TABLET    TAKE ONE TABLET BY MOUTH ONE TIME DAILY WITH OR IMMEDIATELY FOLLOWING A MEAL   MUPIROCIN OINTMENT (BACTROBAN) 2 %    Apply 1 Application topically daily.   NITROGLYCERIN (NITROSTAT) 0.4 MG SL TABLET    Place 1 tablet (0.4 mg total) under the tongue every 5 (five) minutes as needed for chest pain.   PROPRANOLOL (INDERAL) 20 MG TABLET    TAKE 1 TABLET (20 MG TOTAL) BY MOUTH AS NEEDED (AS NEEDED FOR FAST HEART RATES).   SENNA (SENOKOT) 8.6 MG TABLET    Take 2 tablets by mouth at bedtime.   SERTRALINE (ZOLOFT) 50 MG TABLET    Take 1 tablet (50 mg total) by mouth daily.   SIMVASTATIN (ZOCOR) 40 MG TABLET    Take 1 tablet (40 mg total) by mouth daily.   SPIRONOLACTONE (ALDACTONE) 25 MG TABLET    Take 1 tablet (25 mg total) by mouth daily.    TRAZODONE (DESYREL) 50 MG TABLET    Take 1 tablet (50 mg total) by mouth at bedtime as needed. for insomnia  Modified Medications   No medications on file  Discontinued Medications   No medications on file    Physical Exam:  Vitals:   07/26/23 1252  BP: 106/72  Pulse: 74  Resp: 17  Temp: (!) 97.5 F (36.4 C)  SpO2: 95%  Weight: 100.6 kg  Height: 5' 11.85" (1.825 m)   Body mass index is 30.21 kg/m. Wt Readings from Last 3 Encounters:  07/26/23 100.6 kg  01/26/23 98.2 kg  01/22/23 97.5 kg    Physical Exam Constitutional:      Appearance: Normal appearance.  Cardiovascular:     Rate and Rhythm: Normal rate and regular rhythm.     Pulses: Normal pulses.     Heart sounds: Normal heart sounds.  Pulmonary:     Effort: Pulmonary effort is normal.     Breath sounds: Normal breath sounds.  Abdominal:     General: Bowel sounds are normal.     Palpations: Abdomen is soft.  Musculoskeletal:     Right lower leg: Edema present.     Left lower leg: Edema (slightly greater on left; cool to touch, no redness or tenderness) present.     Comments: Wearing back brace, using cane; slow but steady gait  Skin:    General: Skin is warm and dry.  Neurological:     General: No focal deficit present.     Mental Status: He is alert and oriented to person, place, and time. Mental status is at baseline.  Psychiatric:  Mood and Affect: Mood normal.        Behavior: Behavior normal.    Labs reviewed: Basic Metabolic Panel: Recent Labs    08/24/22 1029 01/22/23 0916  NA 136 137  K 4.5 4.8  CL 102 102  CO2 27 27  GLUCOSE 119* 120*  BUN 26* 23  CREATININE 1.52* 1.51*  CALCIUM 9.6 9.7   Liver Function Tests: Recent Labs    08/24/22 1029 01/22/23 0916  AST 16 18  ALT 14 18  BILITOT 0.5 0.4  PROT 6.5 6.3   No results for input(s): "LIPASE", "AMYLASE" in the last 8760 hours. No results for input(s): "AMMONIA" in the last 8760 hours. CBC: Recent Labs    08/24/22 1029  01/22/23 0916  WBC 7.8 7.8  NEUTROABS 5,132 5,382  HGB 13.8 13.1*  HCT 40.6 39.3  MCV 90.6 91.6  PLT 268 308   Lipid Panel: Recent Labs    08/24/22 1029  CHOL 118  HDL 53  LDLCALC 45  TRIG 116  CHOLHDL 2.2   TSH: No results for input(s): "TSH" in the last 8760 hours. A1C: Lab Results  Component Value Date   HGBA1C 6.3 (H) 01/22/2023     Assessment/Plan 1. Essential hypertension, benign -Controlled, at goal, <140/90 -Continue current medications -Encouraged dietary modifications/DASH diet and physical activity as tolerated - Complete Metabolic Panel with eGFR - CBC with Differential/Platelet  2. Paroxysmal tachycardia (HCC) -Controlled, denies palpitations -Continue metoprolol succinate  3. Mixed hyperlipidemia -Continue ezetimibe, simvastatin -Encouraged dietary modifications and physical activity as tolerated - Lipid Panel - Complete Metabolic Panel with eGFR - CBC with Differential/Platelet  4. Hyperglycemia - Hemoglobin A1c -Encouraged dietary modifications (decrease sugar/carbohydrate intake)  5. Obstructive sleep apnea on CPAP -Stable, on CPAP  6. Gastroesophageal reflux disease without esophagitis -Ongoing, having chronic cough, especially after meals -Encouraged dietary modifications (avoid trigger foods) and physical activity as tolerated -Declines PPI therapy at this time, states he will consider if worsening symptoms  7. Chronic gout without tophus, unspecified cause, unspecified site -Stable, no flare-ups -PRN colchicine  8. Atherosclerosis of native coronary artery of native heart with stable angina pectoris (HCC) -Stable, no symptoms -Continue current medication regimen -Continue care with cardiology  9. Need for influenza vaccination - Flu Vaccine Trivalent High Dose (Fluad)  10. Bilateral leg edema -Elevate legs above levels of heart as tolerated -Compression hose as tolerated (on in AM, off in PM) -Slightly greater in left than  right, no redness, warmth Notify is worsens, pain/tenderness, redness occurs   11. Chronic constipation -Continue senna -May take miralax 17gm daily -- can do half dose or every other day if needed -plans to decrease pain medication which will help symptoms.   Return in about 6 months (around 01/24/2024) for routine follow up .  Rollen Sox, FNP-MSN Student -I personally was present during the history, physical exam and medical decision-making activities of this service and have verified that the service and findings are accurately documented in the student's note Abbey Chatters, NP

## 2023-07-26 NOTE — Patient Instructions (Addendum)
-  encouraged to elevate legs above level of heart as tolerates, low sodium diet, compression hose as tolerates (on in am, off in pm)  To use miralax 17 gm daily for constipation- can take half dose or every other day if needed

## 2023-07-27 ENCOUNTER — Encounter: Payer: Self-pay | Admitting: Nurse Practitioner

## 2023-07-27 LAB — CBC WITH DIFFERENTIAL/PLATELET
Absolute Lymphocytes: 2129 {cells}/uL (ref 850–3900)
Absolute Monocytes: 971 {cells}/uL — ABNORMAL HIGH (ref 200–950)
Basophils Absolute: 117 {cells}/uL (ref 0–200)
Basophils Relative: 1 %
Eosinophils Absolute: 433 {cells}/uL (ref 15–500)
Eosinophils Relative: 3.7 %
HCT: 43.6 % (ref 38.5–50.0)
Hemoglobin: 14 g/dL (ref 13.2–17.1)
MCH: 30.3 pg (ref 27.0–33.0)
MCHC: 32.1 g/dL (ref 32.0–36.0)
MCV: 94.4 fL (ref 80.0–100.0)
MPV: 9.5 fL (ref 7.5–12.5)
Monocytes Relative: 8.3 %
Neutro Abs: 8050 {cells}/uL — ABNORMAL HIGH (ref 1500–7800)
Neutrophils Relative %: 68.8 %
Platelets: 336 10*3/uL (ref 140–400)
RBC: 4.62 10*6/uL (ref 4.20–5.80)
RDW: 12.8 % (ref 11.0–15.0)
Total Lymphocyte: 18.2 %
WBC: 11.7 10*3/uL — ABNORMAL HIGH (ref 3.8–10.8)

## 2023-07-27 LAB — COMPLETE METABOLIC PANEL WITH GFR
AG Ratio: 1.8 (calc) (ref 1.0–2.5)
ALT: 16 U/L (ref 9–46)
AST: 16 U/L (ref 10–35)
Albumin: 4.2 g/dL (ref 3.6–5.1)
Alkaline phosphatase (APISO): 53 U/L (ref 35–144)
BUN/Creatinine Ratio: 16 (calc) (ref 6–22)
BUN: 23 mg/dL (ref 7–25)
CO2: 28 mmol/L (ref 20–32)
Calcium: 9.7 mg/dL (ref 8.6–10.3)
Chloride: 101 mmol/L (ref 98–110)
Creat: 1.46 mg/dL — ABNORMAL HIGH (ref 0.70–1.28)
Globulin: 2.3 g/dL (ref 1.9–3.7)
Glucose, Bld: 117 mg/dL — ABNORMAL HIGH (ref 65–99)
Potassium: 4.8 mmol/L (ref 3.5–5.3)
Sodium: 138 mmol/L (ref 135–146)
Total Bilirubin: 0.5 mg/dL (ref 0.2–1.2)
Total Protein: 6.5 g/dL (ref 6.1–8.1)
eGFR: 49 mL/min/{1.73_m2} — ABNORMAL LOW (ref >=60)

## 2023-07-27 LAB — LIPID PANEL
Cholesterol: 155 mg/dL (ref <200)
HDL: 47 mg/dL (ref >=40)
LDL Cholesterol (Calc): 80 mg/dL
Non-HDL Cholesterol (Calc): 108 mg/dL (ref <130)
Total CHOL/HDL Ratio: 3.3 (calc) (ref <5.0)
Triglycerides: 182 mg/dL — ABNORMAL HIGH (ref <150)

## 2023-07-27 LAB — HEMOGLOBIN A1C
Hgb A1c MFr Bld: 6.5 %{Hb} — ABNORMAL HIGH (ref <5.7)
Mean Plasma Glucose: 140 mg/dL
eAG (mmol/L): 7.7 mmol/L

## 2023-07-28 MED ORDER — AMOXICILLIN-POT CLAVULANATE 875-125 MG PO TABS
1.0000 | ORAL_TABLET | Freq: Two times a day (BID) | ORAL | 0 refills | Status: DC
Start: 2023-07-28 — End: 2023-10-05

## 2023-08-09 ENCOUNTER — Other Ambulatory Visit: Payer: Self-pay | Admitting: *Deleted

## 2023-08-09 ENCOUNTER — Encounter: Payer: Self-pay | Admitting: Nurse Practitioner

## 2023-08-09 MED ORDER — TRAZODONE HCL 50 MG PO TABS: 50.0000 mg | ORAL_TABLET | Freq: Every evening | ORAL | 1 refills | Status: DC | PRN

## 2023-08-09 MED ORDER — SPIRONOLACTONE 25 MG PO TABS: 25.0000 mg | ORAL_TABLET | Freq: Every day | ORAL | 1 refills | Status: DC

## 2023-08-09 NOTE — Telephone Encounter (Signed)
Pharmacy requested refill. Pended Rx and sent to [name] for approval due to HIGH ALERT Warning.  

## 2023-08-27 DIAGNOSIS — S32010D Wedge compression fracture of first lumbar vertebra, subsequent encounter for fracture with routine healing: Secondary | ICD-10-CM | POA: Diagnosis not present

## 2023-09-06 ENCOUNTER — Other Ambulatory Visit: Payer: Self-pay | Admitting: *Deleted

## 2023-09-06 DIAGNOSIS — E782 Mixed hyperlipidemia: Secondary | ICD-10-CM

## 2023-09-06 MED ORDER — SIMVASTATIN 40 MG PO TABS: 40.0000 mg | ORAL_TABLET | Freq: Every day | ORAL | 1 refills | Status: DC

## 2023-09-06 NOTE — Telephone Encounter (Signed)
[facility] requested refill.

## 2023-09-09 DIAGNOSIS — M799 Soft tissue disorder, unspecified: Secondary | ICD-10-CM | POA: Diagnosis not present

## 2023-09-09 DIAGNOSIS — M6281 Muscle weakness (generalized): Secondary | ICD-10-CM | POA: Diagnosis not present

## 2023-09-09 DIAGNOSIS — M549 Dorsalgia, unspecified: Secondary | ICD-10-CM | POA: Diagnosis not present

## 2023-09-13 DIAGNOSIS — M799 Soft tissue disorder, unspecified: Secondary | ICD-10-CM | POA: Diagnosis not present

## 2023-09-13 DIAGNOSIS — M6281 Muscle weakness (generalized): Secondary | ICD-10-CM | POA: Diagnosis not present

## 2023-09-13 DIAGNOSIS — M549 Dorsalgia, unspecified: Secondary | ICD-10-CM | POA: Diagnosis not present

## 2023-09-14 ENCOUNTER — Ambulatory Visit: Payer: Medicare HMO | Admitting: Dermatology

## 2023-09-15 ENCOUNTER — Ambulatory Visit: Payer: Medicare HMO | Admitting: Dermatology

## 2023-09-16 DIAGNOSIS — M799 Soft tissue disorder, unspecified: Secondary | ICD-10-CM | POA: Diagnosis not present

## 2023-09-16 DIAGNOSIS — M6281 Muscle weakness (generalized): Secondary | ICD-10-CM | POA: Diagnosis not present

## 2023-09-16 DIAGNOSIS — M549 Dorsalgia, unspecified: Secondary | ICD-10-CM | POA: Diagnosis not present

## 2023-09-20 DIAGNOSIS — M549 Dorsalgia, unspecified: Secondary | ICD-10-CM | POA: Diagnosis not present

## 2023-09-20 DIAGNOSIS — M799 Soft tissue disorder, unspecified: Secondary | ICD-10-CM | POA: Diagnosis not present

## 2023-09-20 DIAGNOSIS — M6281 Muscle weakness (generalized): Secondary | ICD-10-CM | POA: Diagnosis not present

## 2023-09-22 DIAGNOSIS — M6281 Muscle weakness (generalized): Secondary | ICD-10-CM | POA: Diagnosis not present

## 2023-09-22 DIAGNOSIS — M799 Soft tissue disorder, unspecified: Secondary | ICD-10-CM | POA: Diagnosis not present

## 2023-09-22 DIAGNOSIS — M549 Dorsalgia, unspecified: Secondary | ICD-10-CM | POA: Diagnosis not present

## 2023-10-03 ENCOUNTER — Other Ambulatory Visit: Payer: Self-pay | Admitting: Nurse Practitioner

## 2023-10-03 DIAGNOSIS — I10 Essential (primary) hypertension: Secondary | ICD-10-CM

## 2023-10-04 ENCOUNTER — Other Ambulatory Visit: Payer: Self-pay

## 2023-10-04 DIAGNOSIS — F339 Major depressive disorder, recurrent, unspecified: Secondary | ICD-10-CM

## 2023-10-04 DIAGNOSIS — M799 Soft tissue disorder, unspecified: Secondary | ICD-10-CM | POA: Diagnosis not present

## 2023-10-04 DIAGNOSIS — M549 Dorsalgia, unspecified: Secondary | ICD-10-CM | POA: Diagnosis not present

## 2023-10-04 DIAGNOSIS — M6281 Muscle weakness (generalized): Secondary | ICD-10-CM | POA: Diagnosis not present

## 2023-10-04 MED ORDER — HYDROCHLOROTHIAZIDE 25 MG PO TABS: ORAL_TABLET | ORAL | 3 refills | Status: DC

## 2023-10-04 MED ORDER — SERTRALINE HCL 50 MG PO TABS: 50.0000 mg | ORAL_TABLET | Freq: Every day | ORAL | 3 refills | Status: DC

## 2023-10-04 NOTE — Telephone Encounter (Signed)
Pharmacy requested refill. Pended Rx and sent to [name] for approval due to HIGH ALERT Warning.  

## 2023-10-04 NOTE — Telephone Encounter (Signed)
Patient has requested refill on medication that has High Risk Warnings. Medication pend and sent to PCP Janyth Contes Janene Harvey, NP for approval.

## 2023-10-05 ENCOUNTER — Encounter: Payer: Self-pay | Admitting: Nurse Practitioner

## 2023-10-05 ENCOUNTER — Ambulatory Visit (INDEPENDENT_AMBULATORY_CARE_PROVIDER_SITE_OTHER): Payer: Medicare HMO | Admitting: Nurse Practitioner

## 2023-10-05 DIAGNOSIS — Z1212 Encounter for screening for malignant neoplasm of rectum: Secondary | ICD-10-CM

## 2023-10-05 DIAGNOSIS — K635 Polyp of colon: Secondary | ICD-10-CM | POA: Diagnosis not present

## 2023-10-05 DIAGNOSIS — Z1211 Encounter for screening for malignant neoplasm of colon: Secondary | ICD-10-CM

## 2023-10-05 DIAGNOSIS — K621 Rectal polyp: Secondary | ICD-10-CM | POA: Diagnosis not present

## 2023-10-05 DIAGNOSIS — Z0000 Encounter for general adult medical examination without abnormal findings: Secondary | ICD-10-CM | POA: Diagnosis not present

## 2023-10-05 NOTE — Progress Notes (Signed)
 Subjective:   Mitchell Stephens. is a 77 y.o. male who presents for Medicare Annual/Subsequent preventive examination.  Visit Complete: Virtual I connected with  Mitchell Stephens Myrlene Mickey. on 10/05/23 by a video and audio enabled telemedicine application and verified that I am speaking with the correct person using two identifiers.  Patient Location: Home  Provider Location: Office/Clinic  I discussed the limitations of evaluation and management by telemedicine. The patient expressed understanding and agreed to proceed.  Vital Signs: Because this visit was a virtual/telehealth visit, some criteria may be missing or patient reported. Any vitals not documented were not able to be obtained and vitals that have been documented are patient reported.   Cardiac Risk Factors include: family history of premature cardiovascular disease;advanced age (>79men, >21 women);hypertension;male gender;sedentary lifestyle     Objective:    There were no vitals filed for this visit. There is no height or weight on file to calculate BMI.     10/05/2023   10:53 AM 07/26/2023   12:59 PM 04/23/2023    8:54 AM 01/22/2023    8:49 AM 09/30/2022    3:54 PM 08/24/2022    9:38 AM 11/07/2021    8:34 AM  Advanced Directives  Does Patient Have a Medical Advance Directive? Yes Yes No Yes Yes Yes Yes  Type of Advance Directive Living will Living will  Healthcare Power of [place];Living will Healthcare Power of [place];Living will Healthcare Power of [place];Living will Healthcare Power of [place];Living will  Does patient want to make changes to medical advance directive? No - Patient declined No - Patient declined  No - Patient declined No - Patient declined No - Patient declined No - Patient declined  Copy of Healthcare Power of Attorney in Chart?    Yes - validated most recent copy scanned in chart (See row information) Yes - validated most recent copy scanned in chart (See row information) Yes - validated  most recent copy scanned in chart (See row information) Yes - validated most recent copy scanned in chart (See row information)  Would patient like information on creating a medical advance directive?   No - Patient declined        Current Medications (verified) Outpatient Encounter Medications as of 10/05/2023  Medication Sig   aspirin  EC 81 MG tablet Take 81 mg by mouth daily.   buPROPion  (WELLBUTRIN  XL) 300 MG 24 hr tablet TAKE ONE TABLET BY MOUTH ONE TIME DAILY   colchicine  0.6 MG tablet Take 1 tablet (0.6 mg total) by mouth as needed.   ezetimibe  (ZETIA ) 10 MG tablet Take 1 tablet (10 mg total) by mouth daily.   hydrochlorothiazide  (HYDRODIURIL ) 25 MG tablet TAKE 1 TABLET (25 MG TOTAL) BY MOUTH DAILY AS NEEDED.   isosorbide  mononitrate (IMDUR ) 30 MG 24 hr tablet TAKE 1/2 OF A TABLET (15 MG TOTAL) BY MOUTH DAILY   losartan  (COZAAR ) 25 MG tablet TAKE ONE TABLET BY MOUTH ONE TIME DAILY   metoprolol  succinate (TOPROL -XL) 100 MG 24 hr tablet TAKE ONE TABLET BY MOUTH ONE TIME DAILY WITH OR IMMEDIATELY FOLLOWING A MEAL   nitroGLYCERIN  (NITROSTAT ) 0.4 MG SL tablet Place 1 tablet (0.4 mg total) under the tongue every 5 (five) minutes as needed for chest pain.   propranolol  (INDERAL ) 20 MG tablet TAKE 1 TABLET (20 MG TOTAL) BY MOUTH AS NEEDED (AS NEEDED FOR FAST HEART RATES).   senna (SENOKOT) 8.6 MG tablet Take 2 tablets by mouth at bedtime.   sertraline  (ZOLOFT ) 50 MG tablet  Take 1 tablet (50 mg total) by mouth daily.   simvastatin  (ZOCOR ) 40 MG tablet Take 1 tablet (40 mg total) by mouth daily.   spironolactone  (ALDACTONE ) 25 MG tablet Take 1 tablet (25 mg total) by mouth daily.   traZODone  (DESYREL ) 50 MG tablet Take 1 tablet (50 mg total) by mouth at bedtime as needed. for insomnia   [DISCONTINUED] amoxicillin -clavulanate (AUGMENTIN ) 875-125 MG tablet Take 1 tablet by mouth 2 (two) times daily.   [DISCONTINUED] mupirocin  ointment (BACTROBAN ) 2 % Apply 1 Application topically daily.   No  facility-administered encounter medications on file as of 10/05/2023.    Allergies (verified) Pollen extract   History: Past Medical History:  Diagnosis Date   Actinic keratosis 02/10/2021   left ear mid antihelix   Alcoholism (HCC)    per pt   Allergy    Basal cell carcinoma 05/13/2020   left prox medial pretibial    Basal cell carcinoma 08/26/2020   below the left medial knee - tx with ED&C    Basal cell carcinoma 02/10/2021   Right lower eyelid/lat canthus Moh's 08/07/2021   Basal cell carcinoma 12/17/2021   left epigastric, superficial- EDC   Basal cell carcinoma 06/03/2022   left lateral elbow, EDC 06/24/2022   Basal cell carcinoma 06/02/2023   Left upper arm. Superficial and focal infiltration. Excised 06/23/23   Basal cell carcinoma 06/02/2023   Right upper arm. Nodular and infiltrative. Excision pending.   Coronary artery disease    a. 06/2017 MV: EF 45%, large inf and apical defect w/o ischemia;  b. 06/2017 Cath: LM nl, LAD 30ost, LCX 50p, OM1/2 ok, RCA 100p w/ L->R collats. EF 35%.   Depression with anxiety    Gastric ulcer, acute with hemorrhage 2001   Dr. Rollene, [facility]   H/O congenital atrial septal defect (ASD) repair    Heart murmur    ASD repair    Helicobacter pylori gastritis    Heme positive stool    History of gout    History of MRSA infection    History of peptic ulcer disease    had bleeding ulcers requiring transfusion   History of PSVT (paroxysmal supraventricular tachycardia)    Hyperlipidemia    Hypertension    Ischemic cardiomyopathy    a. 06/2017 LV gram: EF 35%.   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   eft chest parasternal sup,  txt'd EDC   Squamous cell carcinoma of skin 01/29/2020   right clavicle   Squamous cell carcinoma of skin 08/13/2021   R lat pectoral - EDC 09/24/2021   Squamous cell carcinoma of skin 11/20/2021   L chest parasternal sup - tx with ED&C   Squamous cell carcinoma of skin 11/20/2021   L chest parasternal inf - tx with ED&C 12/17/21   Squamous cell carcinoma of skin 11/20/2021   R Xyphoid - tx with [facility] 12/17/21   Tobacco abuse    Past Surgical History:  Procedure Laterality Date   ASD REPAIR, OSTIUM PRIMUM  1966   basal cell carcinoma removal     COLONOSCOPY     EAR BIOPSY Left 2013  Dr.Cook--squamous cell ca ([facility])   LEFT HEART CATH AND CORONARY ANGIOGRAPHY Left 06/30/2017   Procedure: LEFT HEART CATH AND CORONARY ANGIOGRAPHY;  Surgeon: Perla Evalene PARAS, MD;  Location: [facility] INVASIVE CV LAB;  Service: Cardiovascular;  Laterality: Left;   MOHS SURGERY     ear   open heart surgery  1966   Dr. Neysa ([facility]); was PFO repair--has been normal in f/u   PALATE / UVULA BIOPSY / EXCISION  2000   Dr.Sprenhe [facility])   TONSILLECTOMY AND ADENOIDECTOMY  1959   TOOTH EXTRACTION  01/06/2023   Dentist   UVULECTOMY     Family History  Problem Relation Age of Onset   Failure to thrive Mother    Arthritis Mother    Other Mother        Colectomy   Brain cancer Father    Hypertension Sister    Brain cancer Sister    Other Sister        Blastoma   Stomach cancer Maternal Grandmother    Cancer Paternal Grandfather        type unknown   Liver cancer Paternal Uncle    Other Other        mother's siblings died of cancer ( abdominal)   Colon cancer Neg Hx    Colon polyps Neg Hx    Esophageal cancer Neg Hx    Rectal cancer Neg Hx    Social History   Socioeconomic History   Marital status: Married    Spouse name: Not on file   Number of children: 1   Years of education: Not on file   Highest education level: Bachelor's degree (e.g., BA, AB, BS)  Occupational History   Occupation:  retired  Tobacco Use   Smoking status: Former    Current packs/day: 0.00    Types: Cigarettes    Start date: 09/03/1974    Quit date: 09/03/2014    Years since quitting: 9.0   Smokeless tobacco: Never  Vaping Use   Vaping status: Never Used  Substance and Sexual Activity   Alcohol use: No    Alcohol/week: 0.0 standard drinks of alcohol    Comment: previously did drink alcohol heavily   Drug use: No    Comment: likes to avoid pain meds, no illicit drugs used   Sexual activity: Yes  Other Topics Concern   Not on file  Social History Narrative   Drinks caffeine   Married in 1973   Lives in house   Two story home, 2 live in home   Sales Engineer   [place] and does yardwork   Has living will and [employer]   Social Drivers of Health   Financial Resource Strain: Low Risk  (07/22/2023)   Overall Financial Resource Strain (CARDIA)    Difficulty of Paying Living Expenses: Not hard at all  Food Insecurity: No Food Insecurity (07/22/2023)   Hunger Vital Sign    Worried About Running Out of Food in the Last Year: Never true    Ran Out of Food in the Last Year: Never true  Transportation Needs: Unknown (07/22/2023)   PRAPARE - Administrator, Civil Service (Medical): Not on file    Lack of Transportation (Non-Medical): No  Physical Activity: Unknown (07/22/2023)   Exercise Vital Sign    Days of Exercise per Week: 0 days    Minutes of Exercise per Session: Not on file  Stress: No Stress Concern Present (07/22/2023)   [employer] of Occupational Health - Occupational  Stress Questionnaire    Feeling of Stress : Not at all  Social Connections: Socially Integrated (07/22/2023)   Social Connection and Isolation Panel [NHANES]    Frequency of Communication with Friends and Family: More than three times a week    Frequency of Social Gatherings with Friends and Family: Patient declined    Attends Religious Services: More than 4 times per year    Active Member of [employer] or  Organizations: Yes    Attends Banker Meetings: 1 to 4 times per year    Marital Status: Married    Tobacco Counseling Counseling given: Not Answered   Clinical Intake:  Pre-visit preparation completed: Yes  Pain : No/denies pain     BMI - recorded: 30 Nutritional Status: BMI > 30  Obese Diabetes: No  How often do you need to have someone help you when you read instructions, pamphlets, or other written materials from your doctor or pharmacy?: 1 - Never         Activities of Daily Living    10/05/2023   11:03 AM  In your present state of health, do you have any difficulty performing the following activities:  Hearing? 0  Vision? 0  Difficulty concentrating or making decisions? 0  Walking or climbing stairs? 1  Dressing or bathing? 0  Doing errands, shopping? 0  Preparing Food and eating ? N  Using the Toilet? N  In the past six months, have you accidently leaked urine? N  Do you have problems with loss of bowel control? N  Managing your Medications? N  Managing your Finances? N  Housekeeping or managing your Housekeeping? N    Patient Care Team: Caro Harlene POUR, NP as PCP - General (Geriatric Medicine) Jordan, Peter M, MD as PCP - Cardiology (Cardiology) Clayburn Heinrich, [place] as Referring Physician (Dermatology) Milissa Hamming, MD as Referring Physician (Otolaryngology) Myrna Adine Anes, MD as Consulting Physician (Ophthalmology)  Indicate any recent Medical Services you may have received from other than [facility] providers in the past year (date may be approximate).     Assessment:   This is a routine wellness examination for [place].  Hearing/Vision screen Vision Screening - Comments:: [facility] Last Eye Exam: 2024   Goals Addressed   None    Depression Screen    10/05/2023   10:54 AM 04/23/2023    8:53 AM 10/01/2022   10:47 AM 08/24/2022    9:38 AM 05/22/2022    9:47 AM 11/07/2021   10:04 AM 11/07/2021    9:34 AM  PHQ 2/9  Scores  PHQ - 2 Score 0 0 0 0 0 6 0  PHQ- 9 Score  0    12     Fall Risk    10/05/2023   10:54 AM 07/26/2023   12:58 PM 04/23/2023    8:53 AM 01/22/2023    8:48 AM 10/01/2022   10:47 AM  Fall Risk   Falls in the past year? 1 1 1  0 0  Number falls in past yr: 0 0 1 0 0  Injury with Fall? 1 1 1  0 0  Risk for fall due to :  History of fall(s);Impaired balance/gait;Impaired mobility No Fall Risks No Fall Risks No Fall Risks  Follow up  Falls evaluation completed;Education provided;Falls prevention discussed Falls evaluation completed Falls evaluation completed Falls evaluation completed    MEDICARE RISK AT HOME: Medicare Risk at Home Any stairs in or around the home?: Yes If so, are there any without  handrails?: No Home free of loose throw rugs in walkways, pet beds, electrical cords, etc?: Yes Adequate lighting in your home to reduce risk of falls?: Yes Life alert?: No Use of a cane, walker or w/c?: No Grab bars in the bathroom?: Yes Shower chair or bench in shower?: No Elevated toilet seat or a handicapped toilet?: No  TIMED UP AND GO:  Was the test performed?  No    Cognitive Function:    09/07/2018   10:32 AM 08/12/2017    9:27 AM 08/07/2016    9:08 AM 05/09/2015    2:03 PM  MMSE - Mini Mental State Exam  Orientation to time 5 4 5 4   Orientation to Place 5 5 5 5   Registration 3 3 3 3   Attention/ Calculation 5 5 5 5   Recall 3 3 3 1   Language- name 2 objects 2 2 2 2   Language- repeat 1 1 1 1   Language- follow 3 step command 3 3 3 3   Language- read & follow direction 1 1 1 1   Write a sentence 1 1 1 1   Copy design 1 1 1 1   Total score 30 29 30 27         10/05/2023   10:55 AM 10/01/2022   10:49 AM 09/16/2021    3:18 PM 09/13/2020    9:23 AM 09/12/2019    8:57 AM  6CIT Screen  What Year? 0 points 0 points 0 points 0 points 0 points  What month? 0 points 0 points 0 points 0 points 0 points  What time? 0 points 0 points 0 points 0 points 0 points  Count back from  20 0 points 0 points 0 points 0 points 0 points  Months in reverse 0 points 0 points 0 points 0 points 0 points  Repeat phrase 0 points 0 points 0 points 0 points 0 points  Total Score 0 points 0 points 0 points 0 points 0 points    Immunizations Immunization History  Administered Date(s) Administered   Fluad Quad(high Dose 65+) 06/01/2019, 06/07/2020, 07/25/2021   Fluad Trivalent(High Dose 65+) 07/26/2023   Influenza, High Dose Seasonal PF 06/25/2017, 07/25/2018, 07/06/2022   Influenza,inj,Quad PF,6+ Mos 08/27/2015, 07/10/2016   Moderna Covid-19 Fall Seasonal Vaccine 82yrs & older 06/26/2022   Moderna Covid-19 Vaccine Bivalent Booster 85yrs & up 06/10/2021, 02/12/2022   Moderna Sars-Covid-2 Vaccination 10/27/2019, 11/24/2019, 07/10/2020, 01/03/2021   Pneumococcal Conjugate-13 10/05/2013   Pneumococcal Polysaccharide-23 08/07/2016   Respiratory Syncytial Virus Vaccine,Recomb Aduvanted(Arexvy) 05/25/2022   Unspecified SARS-COV-2 Vaccination 07/26/2023   Zoster Recombinant(Shingrix) 07/06/2017, 12/08/2017    TDAP status: Due, Education has been provided regarding the importance of this vaccine. Advised may receive this vaccine at local pharmacy or Health Dept. Aware to provide a copy of the vaccination record if obtained from local pharmacy or Health Dept. Verbalized acceptance and understanding.  Flu Vaccine status: Up to date  Pneumococcal vaccine status: Up to date  Covid-19 vaccine status: Information provided on how to obtain vaccines.   Qualifies for Shingles Vaccine? Yes   Zostavax completed Yes   Shingrix Completed?: Yes  Screening Tests Health Maintenance  Topic Date Due   DTaP/Tdap/Td (1 - Tdap) Never done   COVID-19 Vaccine (9 - 2024-25 season) 09/20/2023   Colonoscopy  01/23/2024   Medicare Annual Wellness (AWV)  10/04/2024   Pneumonia Vaccine 56+ Years old  Completed   INFLUENZA VACCINE  Completed   Hepatitis C Screening  Completed   Zoster Vaccines- Shingrix   Completed  HPV VACCINES  Aged Out    Health Maintenance  Health Maintenance Due  Topic Date Due   DTaP/Tdap/Td (1 - Tdap) Never done   COVID-19 Vaccine (9 - 2024-25 season) 09/20/2023   Colonoscopy  01/23/2024    Colorectal cancer screening: Referral to GI placed today due to hx of abnormal colonoscopy . Pt aware the office will call re: appt.  Lung Cancer Screening: (Low Dose CT Chest recommended if Age 90-80 years, 20 pack-year currently smoking OR have quit w/in 15years.) does not qualify.   Lung Cancer Screening Referral: na  Additional Screening:  Hepatitis C Screening: does qualify; Completed   Vision Screening: Recommended annual ophthalmology exams for early detection of glaucoma and other disorders of the eye. Is the patient up to date with their annual eye exam?  Yes  Who is the provider or what is the name of the office in which the patient attends annual eye exams? [facility] If pt is not established with a provider, would they like to be referred to a provider to establish care? No .   Dental Screening: Recommended annual dental exams for proper oral hygiene   Community Resource Referral / Chronic Care Management: CRR required this visit?  No   CCM required this visit?  No     Plan:     I have personally reviewed and noted the following in the patient's chart:   Medical and social history Use of alcohol, tobacco or illicit drugs  Current medications and supplements including opioid prescriptions. Patient is not currently taking opioid prescriptions. Functional ability and status Nutritional status Physical activity Advanced directives List of other physicians Hospitalizations, surgeries, and ER visits in previous 12 months Vitals Screenings to include cognitive, depression, and falls Referrals and appointments  In addition, I have reviewed and discussed with patient certain preventive protocols, quality metrics, and best practice  recommendations. A written personalized care plan for preventive services as well as general preventive health recommendations were provided to patient.     Harlene MARLA An, NP   10/05/2023   After Visit Summary: (MyChart) Due to this being a telephonic visit, the after visit summary with patients personalized plan was offered to patient via MyChart

## 2023-10-05 NOTE — Progress Notes (Signed)
  This service is provided via telemedicine  No vital signs collected/recorded due to the encounter was a telemedicine visit.   Location of patient (ex: home, work):  Home  Patient consents to a telephone visit:  Yes  Location of the provider (ex: office, home):  Office [place].   Name of any referring provider:  na  Names of all persons participating in the telemedicine service and their role in the encounter:  Jayson Lambing, Patient, Donzell Beal, CMA, Harlene An, NP  Time spent on call:  7:11

## 2023-10-21 ENCOUNTER — Ambulatory Visit: Payer: Medicare HMO | Admitting: Gastroenterology

## 2023-10-26 DIAGNOSIS — M549 Dorsalgia, unspecified: Secondary | ICD-10-CM | POA: Diagnosis not present

## 2023-10-26 DIAGNOSIS — M799 Soft tissue disorder, unspecified: Secondary | ICD-10-CM | POA: Diagnosis not present

## 2023-10-26 DIAGNOSIS — M6281 Muscle weakness (generalized): Secondary | ICD-10-CM | POA: Diagnosis not present

## 2023-10-28 DIAGNOSIS — M799 Soft tissue disorder, unspecified: Secondary | ICD-10-CM | POA: Diagnosis not present

## 2023-10-28 DIAGNOSIS — M549 Dorsalgia, unspecified: Secondary | ICD-10-CM | POA: Diagnosis not present

## 2023-10-28 DIAGNOSIS — M6281 Muscle weakness (generalized): Secondary | ICD-10-CM | POA: Diagnosis not present

## 2023-11-01 DIAGNOSIS — M6281 Muscle weakness (generalized): Secondary | ICD-10-CM | POA: Diagnosis not present

## 2023-11-01 DIAGNOSIS — M549 Dorsalgia, unspecified: Secondary | ICD-10-CM | POA: Diagnosis not present

## 2023-11-01 DIAGNOSIS — M799 Soft tissue disorder, unspecified: Secondary | ICD-10-CM | POA: Diagnosis not present

## 2023-11-04 DIAGNOSIS — M799 Soft tissue disorder, unspecified: Secondary | ICD-10-CM | POA: Diagnosis not present

## 2023-11-04 DIAGNOSIS — M6281 Muscle weakness (generalized): Secondary | ICD-10-CM | POA: Diagnosis not present

## 2023-11-04 DIAGNOSIS — M549 Dorsalgia, unspecified: Secondary | ICD-10-CM | POA: Diagnosis not present

## 2023-11-08 DIAGNOSIS — M6281 Muscle weakness (generalized): Secondary | ICD-10-CM | POA: Diagnosis not present

## 2023-11-08 DIAGNOSIS — M549 Dorsalgia, unspecified: Secondary | ICD-10-CM | POA: Diagnosis not present

## 2023-11-08 DIAGNOSIS — M799 Soft tissue disorder, unspecified: Secondary | ICD-10-CM | POA: Diagnosis not present

## 2023-11-10 DIAGNOSIS — D045 Carcinoma in situ of skin of trunk: Secondary | ICD-10-CM | POA: Diagnosis not present

## 2023-11-10 DIAGNOSIS — D225 Melanocytic nevi of trunk: Secondary | ICD-10-CM | POA: Diagnosis not present

## 2023-11-10 DIAGNOSIS — D2262 Melanocytic nevi of left upper limb, including shoulder: Secondary | ICD-10-CM | POA: Diagnosis not present

## 2023-11-10 DIAGNOSIS — D0472 Carcinoma in situ of skin of left lower limb, including hip: Secondary | ICD-10-CM | POA: Diagnosis not present

## 2023-11-10 DIAGNOSIS — L570 Actinic keratosis: Secondary | ICD-10-CM | POA: Diagnosis not present

## 2023-11-10 DIAGNOSIS — Z08 Encounter for follow-up examination after completed treatment for malignant neoplasm: Secondary | ICD-10-CM | POA: Diagnosis not present

## 2023-11-10 DIAGNOSIS — L821 Other seborrheic keratosis: Secondary | ICD-10-CM | POA: Diagnosis not present

## 2023-11-10 DIAGNOSIS — D2261 Melanocytic nevi of right upper limb, including shoulder: Secondary | ICD-10-CM | POA: Diagnosis not present

## 2023-11-10 DIAGNOSIS — D0439 Carcinoma in situ of skin of other parts of face: Secondary | ICD-10-CM | POA: Diagnosis not present

## 2023-11-10 DIAGNOSIS — D2271 Melanocytic nevi of right lower limb, including hip: Secondary | ICD-10-CM | POA: Diagnosis not present

## 2023-11-10 DIAGNOSIS — D485 Neoplasm of uncertain behavior of skin: Secondary | ICD-10-CM | POA: Diagnosis not present

## 2023-11-10 DIAGNOSIS — D0422 Carcinoma in situ of skin of left ear and external auricular canal: Secondary | ICD-10-CM | POA: Diagnosis not present

## 2023-11-10 DIAGNOSIS — D2272 Melanocytic nevi of left lower limb, including hip: Secondary | ICD-10-CM | POA: Diagnosis not present

## 2023-11-10 DIAGNOSIS — Z85828 Personal history of other malignant neoplasm of skin: Secondary | ICD-10-CM | POA: Diagnosis not present

## 2023-11-10 DIAGNOSIS — C44519 Basal cell carcinoma of skin of other part of trunk: Secondary | ICD-10-CM | POA: Diagnosis not present

## 2023-11-11 ENCOUNTER — Encounter: Payer: Self-pay | Admitting: Nurse Practitioner

## 2023-11-11 DIAGNOSIS — M549 Dorsalgia, unspecified: Secondary | ICD-10-CM | POA: Diagnosis not present

## 2023-11-11 DIAGNOSIS — M799 Soft tissue disorder, unspecified: Secondary | ICD-10-CM | POA: Diagnosis not present

## 2023-11-11 DIAGNOSIS — M6281 Muscle weakness (generalized): Secondary | ICD-10-CM | POA: Diagnosis not present

## 2023-11-15 DIAGNOSIS — M6281 Muscle weakness (generalized): Secondary | ICD-10-CM | POA: Diagnosis not present

## 2023-11-15 DIAGNOSIS — M549 Dorsalgia, unspecified: Secondary | ICD-10-CM | POA: Diagnosis not present

## 2023-11-15 DIAGNOSIS — M799 Soft tissue disorder, unspecified: Secondary | ICD-10-CM | POA: Diagnosis not present

## 2023-11-18 ENCOUNTER — Encounter: Payer: Self-pay | Admitting: Nurse Practitioner

## 2023-11-19 DIAGNOSIS — M6281 Muscle weakness (generalized): Secondary | ICD-10-CM | POA: Diagnosis not present

## 2023-11-19 DIAGNOSIS — M549 Dorsalgia, unspecified: Secondary | ICD-10-CM | POA: Diagnosis not present

## 2023-11-19 DIAGNOSIS — M799 Soft tissue disorder, unspecified: Secondary | ICD-10-CM | POA: Diagnosis not present

## 2023-11-22 DIAGNOSIS — M799 Soft tissue disorder, unspecified: Secondary | ICD-10-CM | POA: Diagnosis not present

## 2023-11-22 DIAGNOSIS — M6281 Muscle weakness (generalized): Secondary | ICD-10-CM | POA: Diagnosis not present

## 2023-11-22 DIAGNOSIS — M549 Dorsalgia, unspecified: Secondary | ICD-10-CM | POA: Diagnosis not present

## 2023-11-30 DIAGNOSIS — M799 Soft tissue disorder, unspecified: Secondary | ICD-10-CM | POA: Diagnosis not present

## 2023-11-30 DIAGNOSIS — M6281 Muscle weakness (generalized): Secondary | ICD-10-CM | POA: Diagnosis not present

## 2023-11-30 DIAGNOSIS — M549 Dorsalgia, unspecified: Secondary | ICD-10-CM | POA: Diagnosis not present

## 2023-12-02 DIAGNOSIS — M549 Dorsalgia, unspecified: Secondary | ICD-10-CM | POA: Diagnosis not present

## 2023-12-02 DIAGNOSIS — M799 Soft tissue disorder, unspecified: Secondary | ICD-10-CM | POA: Diagnosis not present

## 2023-12-02 DIAGNOSIS — M6281 Muscle weakness (generalized): Secondary | ICD-10-CM | POA: Diagnosis not present

## 2023-12-06 DIAGNOSIS — M799 Soft tissue disorder, unspecified: Secondary | ICD-10-CM | POA: Diagnosis not present

## 2023-12-06 DIAGNOSIS — M549 Dorsalgia, unspecified: Secondary | ICD-10-CM | POA: Diagnosis not present

## 2023-12-06 DIAGNOSIS — M6281 Muscle weakness (generalized): Secondary | ICD-10-CM | POA: Diagnosis not present

## 2023-12-22 DIAGNOSIS — C44519 Basal cell carcinoma of skin of other part of trunk: Secondary | ICD-10-CM | POA: Diagnosis not present

## 2023-12-28 ENCOUNTER — Encounter: Payer: Self-pay | Admitting: Gastroenterology

## 2023-12-28 ENCOUNTER — Ambulatory Visit: Payer: Medicare HMO | Admitting: Gastroenterology

## 2023-12-28 ENCOUNTER — Other Ambulatory Visit: Payer: Self-pay | Admitting: Nurse Practitioner

## 2023-12-28 VITALS — BP 98/60 | HR 92 | Ht 71.0 in | Wt 210.8 lb

## 2023-12-28 DIAGNOSIS — I10 Essential (primary) hypertension: Secondary | ICD-10-CM

## 2023-12-28 DIAGNOSIS — Z87891 Personal history of nicotine dependence: Secondary | ICD-10-CM | POA: Diagnosis not present

## 2023-12-28 DIAGNOSIS — R1319 Other dysphagia: Secondary | ICD-10-CM

## 2023-12-28 DIAGNOSIS — I25118 Atherosclerotic heart disease of native coronary artery with other forms of angina pectoris: Secondary | ICD-10-CM | POA: Diagnosis not present

## 2023-12-28 DIAGNOSIS — Z860100 Personal history of colon polyps, unspecified: Secondary | ICD-10-CM

## 2023-12-28 DIAGNOSIS — Z860101 Personal history of adenomatous and serrated colon polyps: Secondary | ICD-10-CM | POA: Diagnosis not present

## 2023-12-28 DIAGNOSIS — Z800 Family history of malignant neoplasm of digestive organs: Secondary | ICD-10-CM | POA: Diagnosis not present

## 2023-12-28 MED ORDER — SUFLAVE 178.7 G PO SOLR: 1.0000 | Freq: Once | ORAL | 0 refills | Status: AC

## 2023-12-28 NOTE — Patient Instructions (Addendum)
 You have been scheduled for a colonoscopy. Please follow written instructions given to you at your visit today.   If you use inhalers (even only as needed), please bring them with you on the day of your procedure.  DO NOT TAKE 7 DAYS PRIOR TO TEST- Trulicity (dulaglutide) Ozempic, Wegovy (semaglutide) Mounjaro (tirzepatide) Bydureon Bcise (exanatide extended release)  DO NOT TAKE 1 DAY PRIOR TO YOUR TEST Rybelsus (semaglutide) Adlyxin (lixisenatide) Victoza (liraglutide) Byetta (exanatide) ___________________________________________________________________________  Mitchell Stephens will receive your bowel preparation through [facility], which ensures the lowest copay and home delivery, with outreach via text or call from an [phone] number. Please respond promptly to avoid rescheduling of your procedure. If you are interested in alternative options or have any questions regarding your prep, please contact them at [phone] ____________________________________________________________________________  Your Provider Has Sent Your Bowel Prep Regimen To [facility]   [facility] will contact you to verify your information and collect your copay, if applicable. Enjoy the comfort of your home while your prescription is mailed to you, FREE of any shipping charges.   [facility] accepts all major insurance benefits and applies discounts & coupons.  Have additional questions?   Chat: [email or web address] Call: [phone] Email: [email or web address] NCPDP: [id number]  How will [facility] contact you?  With a Welcome phone call,  a Welcome text and a checkout link in text form.  Texts you receive from [phone] Are NOT Spam.  *To set up delivery, you must complete the checkout process via link or speak to one of the patient care representatives. If [facility] is unable to reach you, your prescription may be delayed.  To avoid long hold times on the phone, you may also utilize the secure chat  feature on the [facility] website to request that they call you back for transaction completion or to expedite your concerns.   _______________________________________________________  If your blood pressure at your visit was 140/90 or greater, please contact your primary care physician to follow up on this.  _______________________________________________________  If you are age 25 or older, your body mass index should be between 23-30. Your Body mass index is 29.4 kg/m. If this is out of the aforementioned range listed, please consider follow up with your Primary Care Provider.  If you are age 59 or younger, your body mass index should be between 19-25. Your Body mass index is 29.4 kg/m. If this is out of the aformentioned range listed, please consider follow up with your Primary Care Provider.   ________________________________________________________  The [place] [facility] providers would like to encourage you to use [facility] to communicate with providers for non-urgent requests or questions.  Due to long hold times on the telephone, sending your provider a message by [facility] may be a faster and more efficient way to get a response.  Please allow 48 business hours for a response.  Please remember that this is for non-urgent requests.  _______________________________________________________ It was a pleasure to see you today!  Thank you for trusting me with your gastrointestinal care!

## 2023-12-28 NOTE — Progress Notes (Signed)
 [facility] Consult Note:  History: Mitchell Stephens. 12/28/2023  Referring provider: Sharon Seller, NP  Reason for consult/chief complaint: Colonoscopy (Discuss colonoscopy, no issues )   Subjective  Prior history:  Surveillance colonoscopy with Dr. Orvan Stephens April 20 22-11 subcentimeter polyps (10 specimens noted in pathology report, all tubular high-grade dysplasia) Tubulovillous adenoma in 2012 Reportedly no polyps in 2016   __________   Mitchell Stephens is a 78 year old man here today to discuss colon polyp surveillance.  He has tended toward constipation for many years that he feels is well-controlled with a combination of senna and Dulcolax.  Bowel prep was good on last exam.  He denies rectal bleeding or abdominal pain, loss of appetite or weight change.  Has had a few episodes over years of food feeling briefly stuck in the lower chest.  He was told at 1 point he may have a Schatzki ring so he accommodates for that by eating carefully and having plenty liquids.  He does not consider this to be a cause of concern.   ROS:  Review of Systems  Constitutional:  Negative for appetite change and unexpected weight change.  HENT:  Negative for mouth sores and voice change.   Eyes:  Negative for pain and redness.  Respiratory:  Negative for cough and shortness of breath.   Cardiovascular:  Negative for chest pain and palpitations.  Genitourinary:  Negative for dysuria and hematuria.  Musculoskeletal:  Negative for arthralgias and myalgias.  Skin:  Negative for pallor and rash.  Neurological:  Negative for weakness and headaches.  Hematological:  Negative for adenopathy.     Past Medical History: Past Medical History:  Diagnosis Date   Actinic keratosis 02/10/2021   left ear mid antihelix   Alcoholism (HCC)    per pt   Allergy    Basal cell carcinoma 05/13/2020   left prox medial pretibial    Basal cell carcinoma 08/26/2020   below the left medial  knee - tx with ED&C    Basal cell carcinoma 02/10/2021   Right lower eyelid/lat canthus Moh's 08/07/2021   Basal cell carcinoma 12/17/2021   left epigastric, superficial- EDC   Basal cell carcinoma 06/03/2022   left lateral elbow, EDC 06/24/2022   Basal cell carcinoma 06/02/2023   Left upper arm. Superficial and focal infiltration. Excised 06/23/23   Basal cell carcinoma 06/02/2023   Right upper arm. Nodular and infiltrative. Excision pending.   Coronary artery disease    a. 06/2017 MV: EF 45%, large inf and apical defect w/o ischemia;  b. 06/2017 Cath: LM nl, LAD 30ost, LCX 50p, OM1/2 ok, RCA 100p w/ L->R collats. EF 35%.   Depression with anxiety    Gastric ulcer, acute with hemorrhage 2001   Dr. Okey Stephens, [facility]   H/O congenital atrial septal defect (ASD) repair    Heart murmur    ASD repair    Helicobacter pylori gastritis    Heme positive stool    History of gout    History of MRSA infection    History of peptic ulcer disease    had bleeding ulcers requiring transfusion   History of PSVT (paroxysmal supraventricular tachycardia)    Hyperlipidemia    Hypertension    Ischemic cardiomyopathy    a. 06/2017 LV gram: EF 35%.   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 is a 78 y.o. male  past medical history of Coronary artery disease, occluded RCA proximal region (presumably started March 2018) History of smoking, stopped November 2017 OSA on CPAP HTN Hyperlipidemia Former alcohol abuse, stopped >10 years ago, Prior episodes of tachycardia associated with alcohol, previously declined monitor asd repair, age 63 EF previously 35% by catheterization, estimated  50% on echo 09/2017, moderate MR Who presents for follow-up of his coronary artery disease, occluded RCA"  No angina reported at that visit.  Echocardiogram done with report below   Past Surgical History: Past Surgical History:  Procedure Laterality Date   ASD REPAIR, OSTIUM PRIMUM  1966   basal cell carcinoma removal     COLONOSCOPY     EAR BIOPSY Left 2013   [facility]Cook--squamous cell ca ([facility])   LEFT HEART CATH AND CORONARY ANGIOGRAPHY Left 06/30/2017   Procedure: LEFT HEART CATH AND CORONARY ANGIOGRAPHY;  Surgeon: Mitchell Iba, MD;  Location: [facility] INVASIVE CV LAB;  Service: Cardiovascular;  Laterality: Left;   MOHS SURGERY     ear   open heart surgery  1966   Dr. Maple Stephens ([facility]); was PFO repair--has been normal in f/u   PALATE / UVULA BIOPSY / EXCISION  2000   [facility]Spren[facility] Mitchell Stephens)   TONSILLECTOMY AND ADENOIDECTOMY  1959   TOOTH EXTRACTION  01/06/2023   Dentist   UVULECTOMY       Family History: Family History  Problem Relation Age of Onset   Failure to thrive Mother    Arthritis Mother    Other Mother        Colectomy   Brain cancer Father    Hypertension Sister    Brain cancer Sister    Other Sister        Blastoma   Stomach cancer Maternal Grandmother    Cancer Paternal Grandfather        type unknown   Liver cancer Paternal Uncle    Other Other        mother's siblings died of cancer ( abdominal)   Colon cancer  Neg Hx    Colon polyps Neg Hx    Esophageal cancer Neg Hx    Rectal cancer Neg Hx    Reports his mother had colon cancer inserted other relatives on that side. Social History: Social History   Socioeconomic History   Marital status: Married    Spouse name: Not on file   Number of children: 1   Years of education: Not on file   Highest education level: Bachelor's degree (e.g., BA, AB, BS)  Occupational History   Occupation: retired  Tobacco Use   Smoking status: Former  Current packs/day: 0.00    Types: Cigarettes    Start date: 09/03/1974    Quit date: 09/03/2014    Years since quitting: 9.3   Smokeless tobacco: Never  Vaping Use   Vaping status: Never Used  Substance and Sexual Activity   Alcohol use: No    Alcohol/week: 0.0 standard drinks of alcohol    Comment: previously did drink alcohol heavily   Drug use: No    Comment: likes to avoid pain meds, no illicit drugs used   Sexual activity: Yes  Other Topics Concern   Not on file  Social History Narrative   Drinks caffeine   Married in 1973   Lives in house   Two story home, 2 live in home   Sales Engineer   [place] and does yardwork   Has living will and [employer]   Social Drivers of Health   Financial Resource Strain: Low Risk  (07/22/2023)   Overall Financial Resource Strain (CARDIA)    Difficulty of Paying Living Expenses: Not hard at all  Food Insecurity: No Food Insecurity (07/22/2023)   Hunger Vital Sign    Worried About Running Out of Food in the Last Year: Never true    Ran Out of Food in the Last Year: Never true  Transportation Needs: Unknown (07/22/2023)   PRAPARE - Administrator, Civil Service (Medical): Not on file    Lack of Transportation (Non-Medical): No  Physical Activity: Unknown (07/22/2023)   Exercise Vital Sign    Days of Exercise per Week: 0 days    Minutes of Exercise per Session: Not on file  Stress: No Stress Concern Present (07/22/2023)   [employer] of  Occupational Health - Occupational Stress Questionnaire    Feeling of Stress : Not at all  Social Connections: Socially Integrated (07/22/2023)   Social Connection and Isolation Panel [NHANES]    Frequency of Communication with Friends and Family: More than three times a week    Frequency of Social Gatherings with Friends and Family: Patient declined    Attends Religious Services: More than 4 times per year    Active Member of [employer] or Organizations: Yes    Attends Banker Meetings: 1 to 4 times per year    Marital Status: Married    Allergies: Allergies  Allergen Reactions   Pollen Extract Other (See Comments)    Stuffy nose/itchy throat (Seasonal allergies)    Outpatient Meds: Current Outpatient Medications  Medication Sig Dispense Refill   aspirin EC 81 MG tablet Take 81 mg by mouth daily.     buPROPion (WELLBUTRIN XL) 300 MG 24 hr tablet TAKE ONE TABLET BY MOUTH ONE TIME DAILY 90 tablet 1   colchicine 0.6 MG tablet Take 1 tablet (0.6 mg total) by mouth as needed. 30 tablet 5   ezetimibe (ZETIA) 10 MG tablet Take 1 tablet (10 mg total) by mouth daily. 90 tablet 1   hydrochlorothiazide (HYDRODIURIL) 25 MG tablet TAKE 1 TABLET (25 MG TOTAL) BY MOUTH DAILY AS NEEDED. 90 tablet 3   isosorbide mononitrate (IMDUR) 30 MG 24 hr tablet TAKE 1/2 OF A TABLET (15 MG TOTAL) BY MOUTH DAILY 45 tablet 1   losartan (COZAAR) 25 MG tablet TAKE ONE TABLET BY MOUTH ONE TIME DAILY 90 tablet 1   metoprolol succinate (TOPROL-XL) 100 MG 24 hr tablet TAKE ONE TABLET BY MOUTH ONE TIME DAILY WITH OR IMMEDIATELY FOLLOWING A MEAL 90 tablet 1   nitroGLYCERIN (NITROSTAT) 0.4 MG SL tablet  Place 1 tablet (0.4 mg total) under the tongue every 5 (five) minutes as needed for chest pain. 25 tablet 1   PEG 3350-KCl-NaCl-NaSulf-MgSul (SUFLAVE) 178.7 g SOLR Take 1 each by mouth once for 1 dose. 1 each 0   propranolol (INDERAL) 20 MG tablet TAKE 1 TABLET (20 MG TOTAL) BY MOUTH AS NEEDED (AS NEEDED FOR FAST  HEART RATES). 90 tablet 1   senna (SENOKOT) 8.6 MG tablet Take 2 tablets by mouth at bedtime.     sertraline (ZOLOFT) 50 MG tablet Take 1 tablet (50 mg total) by mouth daily. 90 tablet 3   simvastatin (ZOCOR) 40 MG tablet Take 1 tablet (40 mg total) by mouth daily. 90 tablet 1   spironolactone (ALDACTONE) 25 MG tablet Take 1 tablet (25 mg total) by mouth daily. 90 tablet 1   traZODone (DESYREL) 50 MG tablet Take 1 tablet (50 mg total) by mouth at bedtime as needed. for insomnia 90 tablet 1   No current facility-administered medications for this visit.      ___________________________________________________________________ Objective   Exam:  BP 98/60 (BP Location: Left Arm, Patient Position: Sitting, Cuff Size: Normal)   Pulse 92   Ht 5\' 11"  (1.803 m)   Wt 210 lb 12.8 oz (95.6 kg)   BMI 29.40 kg/m  Wt Readings from Last 3 Encounters:  12/28/23 210 lb 12.8 oz (95.6 kg)  07/26/23 221 lb 12.8 oz (100.6 kg)  01/26/23 216 lb 9.6 oz (98.2 kg)    General: Well-appearing, normal vocal quality Eyes: sclera anicteric, no redness ENT: oral mucosa moist without lesions, no cervical or supraclavicular lymphadenopathy CV: Regular without appreciable murmur, no JVD, no peripheral edema Resp: clear to auscultation bilaterally, normal RR and effort noted GI: soft, no tenderness, with active bowel sounds. No guarding or palpable organomegaly noted. Skin; warm and dry, no rash or jaundice noted Neuro: awake, alert and oriented x 3. Normal gross motor function and fluent speech   Data:  April 2024 echocardiogram  1. Left ventricular ejection fraction, by estimation, is 60 to 65%. The  left ventricle has normal function. The left ventricle has no regional  wall motion abnormalities. Left ventricular diastolic parameters are  consistent with Grade I diastolic  dysfunction (impaired relaxation).   2. Right ventricular systolic function is normal. The right ventricular  size is normal. There  is normal pulmonary artery systolic pressure. The  estimated right ventricular systolic pressure is 24.7 mmHg.   3. Left atrial size was mildly dilated.   4. The mitral valve is normal in structure. Moderate mitral valve  regurgitation. No evidence of mitral stenosis.   5. Tricuspid valve regurgitation is mild to moderate.   6. The aortic valve has an indeterminant number of cusps. Aortic valve  regurgitation is not visualized. No aortic stenosis is present.   7. There is borderline dilatation of the ascending aorta, measuring 39  mm.   8. The inferior vena cava is normal in size with Mitchell than 50%  respiratory variability, suggesting right atrial pressure of 3 mmHg.    _________________________      Latest Ref Rng & Units 07/26/2023    1:43 PM 01/22/2023    9:16 AM 08/24/2022   10:29 AM  CBC  WBC 3.8 - 10.8 Thousand/uL 11.7  7.8  7.8   Hemoglobin 13.2 - 17.1 g/dL 16.1  09.6  04.5   Hematocrit 38.5 - 50.0 % 43.6  39.3  40.6   Platelets 140 - 400 Thousand/uL 336  308  268       Latest Ref Rng & Units 07/26/2023    1:43 PM 01/22/2023    9:16 AM 08/24/2022   10:29 AM  CMP  Glucose 65 - 99 mg/dL 578  469  629   BUN 7 - 25 mg/dL 23  23  26    Creatinine 0.70 - 1.28 mg/dL 5.28  4.13  2.44   Sodium 135 - 146 mmol/L 138  137  136   Potassium 3.5 - 5.3 mmol/L 4.8  4.8  4.5   Chloride 98 - 110 mmol/L 101  102  102   CO2 20 - 32 mmol/L 28  27  27    Calcium 8.6 - 10.3 mg/dL 9.7  9.7  9.6   Total Protein 6.1 - 8.1 g/dL 6.5  6.3  6.5   Total Bilirubin 0.2 - 1.2 mg/dL 0.5  0.4  0.5   AST 10 - 35 U/L 16  18  16    ALT 9 - 46 U/L 16  18  14       Encounter Diagnoses  Name Primary?   Hx of colonic polyps Yes   Atherosclerosis of native coronary artery of native heart with stable angina pectoris (HCC)     Assessment and Plan Assessment & Plan  History of colon polyps, 10 or 11 tubular adenomas on last exam 3 years ago (reports is 11, pathology reports as 10).  He also reports  multiple maternal family members including his mother have had colorectal cancer, which seems to be newly reported to [place]. He is due for surveillance colonoscopy, and I recommended the have it done given the significant polyp findings on last exam as well as reported family history.  He was agreeable after discussion of procedure and risks.  The benefits and risks of the planned procedure(s) were described in detail with the patient or (when appropriate) their health care proxy.  Risks were outlined as including, but not limited to, bleeding, infection, perforation, adverse medication reaction leading to cardiac or pulmonary decompensation, pancreatitis (if ERCP).  The limitation of incomplete mucosal visualization was also discussed.  No guarantees or warranties were given.  He has a history of stable coronary disease with no current anginal symptoms.  Therefore, it seems to me he is unlikely to need further cardiac testing prior to sedation for:Marland Kitchen  Due to see cardiology on 01/28/2024, so we have scheduled his colonoscopy for after that.    Thank you for the courtesy of this consult.  Please call me with any questions or concerns.  Charlie Pitter III  CC: Referring provider noted above

## 2023-12-29 NOTE — Telephone Encounter (Signed)
Pharmacy requested refill. Pended Rx and sent to [name] for approval due to HIGH ALERT Warning.  

## 2023-12-30 DIAGNOSIS — H2513 Age-related nuclear cataract, bilateral: Secondary | ICD-10-CM | POA: Diagnosis not present

## 2023-12-30 DIAGNOSIS — H00022 Hordeolum internum right lower eyelid: Secondary | ICD-10-CM | POA: Diagnosis not present

## 2023-12-30 DIAGNOSIS — H02105 Unspecified ectropion of left lower eyelid: Secondary | ICD-10-CM | POA: Diagnosis not present

## 2024-01-05 DIAGNOSIS — L910 Hypertrophic scar: Secondary | ICD-10-CM | POA: Diagnosis not present

## 2024-01-05 DIAGNOSIS — D0472 Carcinoma in situ of skin of left lower limb, including hip: Secondary | ICD-10-CM | POA: Diagnosis not present

## 2024-01-05 DIAGNOSIS — D045 Carcinoma in situ of skin of trunk: Secondary | ICD-10-CM | POA: Diagnosis not present

## 2024-01-13 DIAGNOSIS — D0422 Carcinoma in situ of skin of left ear and external auricular canal: Secondary | ICD-10-CM | POA: Diagnosis not present

## 2024-01-13 DIAGNOSIS — D0430 Carcinoma in situ of skin of unspecified part of face: Secondary | ICD-10-CM | POA: Diagnosis not present

## 2024-01-24 ENCOUNTER — Ambulatory Visit: Payer: Medicare HMO | Admitting: Nurse Practitioner

## 2024-01-26 NOTE — Progress Notes (Unsigned)
 Date:  01/28/2024   ID:  Mitchell Brook., DOB Aug 28, 1946, MRN [id number]  Patient Location:  [address] [place] [address]   Provider location:   [facility], [place] office for  PCP:  Verma Gobble, NP  Cardiologist:  Cheryll Corti [facility]   Chief Complaint  Patient presents with   12 month follow up     Patient c/o shortness of breath with little exertion. Patient would like to make sure he is cleared to have a colonoscopy; scheduled for next week in [place].      History of Present Illness:    Mitchell Nixon. is a 78 y.o. male  past medical history of Coronary artery disease, occluded RCA proximal region (presumably started March 2018) History of smoking, stopped November 2017 OSA on CPAP HTN Hyperlipidemia Former alcohol abuse, stopped >10 years ago, Prior episodes of tachycardia associated with alcohol, previously declined monitor asd repair, age 38 EF previously 35% by catheterization, estimated  50% on echo 09/2017, moderate MR Who presents for follow-up of his coronary artery disease, occluded RCA  Last seen in clinic by myself 4/24  Reports recent fall getting out of a boat, Trauma to his back, fracture Was evaluated by neurosurgery, recommendation to rest 6 to 12 months Has been sedentary recently, reports some shortness of breath on exertion  Weight down 15 pounds  Prior stress test 2022, fixed defect inferior wall Last echocardiogram 2018, EF 50%, moderate MR  Lab work reviewed Total 118 LDL 45 A1c 6.3  EKG personally reviewed by myself on todays visit EKG Interpretation Date/Time:  Friday January 28 2024 10:21:22 EDT Ventricular Rate:  78 PR Interval:  234 QRS Duration:  114 QT Interval:  402 QTC Calculation: 458 R Axis:   -67  Text Interpretation: Sinus rhythm with 1st degree A-V block Left anterior fascicular block Inferior-posterior infarct (cited on or before 27-Dec-1995) When  compared with ECG of 10-May-2018 14:22, No significant change was found Confirmed by Belva Boyden [phone]) on 01/28/2024 10:47:31 AM    Other past medical history reviewed stress testing 06/2017 which showed a large area of inferior apical scar without ischemia and an EF of 45%.    Diagnostic catheterization showed an occluded proximal right coronary artery with left to right collaterals and EF of 35-45%.  F  Echo in December showed improvement in EF to 50% with moderate MR  Cardiac catheterization June 30, 2017 Prox LAD lesion, 30 %stenosed. Prox Cx lesion, 50 %stenosed. Prox RCA lesion, 100 %stenosed. The left ventricular ejection fraction is 35-45% by visual estimate.   Past Medical History:  Diagnosis Date   Actinic keratosis 02/10/2021   left ear mid antihelix   Alcoholism (HCC)    per pt   Allergy    Basal cell carcinoma 05/13/2020   left prox medial pretibial    Basal cell carcinoma 08/26/2020   below the left medial knee - tx with ED&C    Basal cell carcinoma 02/10/2021   Right lower eyelid/lat canthus Moh's 08/07/2021   Basal cell carcinoma 12/17/2021   left epigastric, superficial- EDC   Basal cell carcinoma 06/03/2022   left lateral elbow, EDC 06/24/2022   Basal cell carcinoma 06/02/2023   Left upper arm. Superficial and focal infiltration. Excised 06/23/23   Basal cell carcinoma 06/02/2023   Right upper arm. Nodular and infiltrative. Excision 06/30/23   Coronary artery disease    a. 06/2017 MV: EF 45%,  large inf and apical defect w/o ischemia;  b. 06/2017 Cath: LM nl, LAD 30ost, LCX 50p, OM1/2 ok, RCA 100p w/ L->R collats. EF 35%.   Depression with anxiety    Gastric ulcer, acute with hemorrhage 2001   Dr. Nicolette Barrio, [facility]   H/O congenital atrial septal defect (ASD) repair    Heart murmur    ASD repair    Helicobacter pylori gastritis    Heme positive stool    History of gout    History of MRSA infection    History of peptic ulcer disease    had bleeding  ulcers requiring transfusion   History of PSVT (paroxysmal supraventricular tachycardia)    Hyperlipidemia    Hypertension    Ischemic cardiomyopathy    a. 06/2017 LV gram: EF 35%.   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   eft chest parasternal sup, txt'd EDC   Squamous cell carcinoma of skin 01/29/2020   right clavicle   Squamous cell carcinoma of skin 08/13/2021   R lat pectoral - EDC 09/24/2021   Squamous cell carcinoma of skin 11/20/2021   L chest parasternal sup - tx with ED&C   Squamous cell carcinoma of skin 11/20/2021   L chest parasternal inf - tx with ED&C 12/17/21   Squamous cell carcinoma of skin 11/20/2021   R Xyphoid - tx with [facility] 12/17/21   Tobacco abuse    Past Surgical History:  Procedure Laterality Date   ASD REPAIR, OSTIUM PRIMUM  1966   basal cell carcinoma removal     COLONOSCOPY     EAR BIOPSY Left 2013   Dr.Cook--squamous cell ca ([facility])   LEFT HEART CATH AND CORONARY ANGIOGRAPHY Left 06/30/2017   Procedure: LEFT HEART CATH AND CORONARY ANGIOGRAPHY;  Surgeon: Devorah Fonder, MD;  Location: [facility] INVASIVE CV LAB;  Service: Cardiovascular;  Laterality: Left;   MOHS SURGERY     ear   open heart surgery  1966   Dr. Linder Revere ([facility]); was PFO repair--has been normal in f/u   PALATE / UVULA BIOPSY / EXCISION  2000   Dr.Sprenhe [facility])   TONSILLECTOMY AND ADENOIDECTOMY  1959   TOOTH EXTRACTION  01/06/2023   Dentist   UVULECTOMY        Allergies:   Pollen extract   Social History   Tobacco Use   Smoking status: Former    Current packs/day: 0.00    Types: Cigarettes    Start date: 09/03/1974    Quit date: 09/03/2014    Years since quitting:  9.4   Smokeless tobacco: Never  Vaping Use   Vaping status: Never Used  Substance Use Topics   Alcohol use: No    Alcohol/week: 0.0 standard drinks of alcohol    Comment: previously did drink alcohol heavily   Drug use: No    Comment: likes to avoid pain meds, no illicit drugs used     Current Outpatient Medications on File Prior to Visit  Medication Sig Dispense Refill   aspirin  EC 81 MG tablet Take 81 mg by mouth daily.     buPROPion  (WELLBUTRIN  XL) 300 MG 24 hr tablet TAKE  ONE TABLET BY MOUTH ONE TIME DAILY 90 tablet 1   colchicine  0.6 MG tablet Take 1 tablet (0.6 mg total) by mouth as needed. 30 tablet 5   ezetimibe  (ZETIA ) 10 MG tablet TAKE ONE TABLET BY MOUTH ONE TIME DAILY 90 tablet 1   hydrochlorothiazide  (HYDRODIURIL ) 25 MG tablet TAKE 1 TABLET (25 MG TOTAL) BY MOUTH DAILY AS NEEDED. 90 tablet 3   isosorbide  mononitrate (IMDUR ) 30 MG 24 hr tablet TAKE 1/2 OF A TABLET (15 MG TOTAL) BY MOUTH DAILY 45 tablet 1   losartan  (COZAAR ) 25 MG tablet TAKE ONE TABLET BY MOUTH ONE TIME DAILY 90 tablet 1   metoprolol  succinate (TOPROL -XL) 100 MG 24 hr tablet TAKE ONE TABLET BY MOUTH ONE TIME DAILY WITH OR IMMEDIATELY FOLLOWING A MEAL 90 tablet 1   nitroGLYCERIN  (NITROSTAT ) 0.4 MG SL tablet Place 1 tablet (0.4 mg total) under the tongue every 5 (five) minutes as needed for chest pain. 25 tablet 1   propranolol  (INDERAL ) 20 MG tablet TAKE 1 TABLET (20 MG TOTAL) BY MOUTH AS NEEDED (AS NEEDED FOR FAST HEART RATES). 90 tablet 1   senna (SENOKOT) 8.6 MG tablet Take 2 tablets by mouth at bedtime.     sertraline  (ZOLOFT ) 50 MG tablet Take 1 tablet (50 mg total) by mouth daily. 90 tablet 3   simvastatin  (ZOCOR ) 40 MG tablet Take 1 tablet (40 mg total) by mouth daily. 90 tablet 1   spironolactone  (ALDACTONE ) 25 MG tablet Take 1 tablet (25 mg total) by mouth daily. 90 tablet 1   traZODone  (DESYREL ) 50 MG tablet Take 1 tablet (50 mg total) by mouth at bedtime as needed. for insomnia 90 tablet 1   No  current facility-administered medications on file prior to visit.     Family Hx: The patient's family history includes Arthritis in his mother; Brain cancer in his father and sister; Cancer in his paternal grandfather; Failure to thrive in his mother; Hypertension in his sister; Liver cancer in his paternal uncle; Other in his mother, sister, and another family member; Stomach cancer in his maternal grandmother. There is no history of Colon cancer, Colon polyps, Esophageal cancer, or Rectal cancer.  ROS:   Please see the history of present illness.    Review of Systems  Constitutional: Negative.   HENT: Negative.    Respiratory: Negative.    Cardiovascular: Negative.   Gastrointestinal: Negative.   Musculoskeletal: Negative.   Neurological: Negative.   Psychiatric/Behavioral: Negative.    All other systems reviewed and are negative.    Labs/Other Tests and Data Reviewed:    Recent Labs: 07/26/2023: ALT 16; BUN 23; Creat 1.46; Hemoglobin 14.0; Platelets 336; Potassium 4.8; Sodium 138   Recent Lipid Panel Lab Results  Component Value Date/Time   CHOL 155 07/26/2023 01:43 PM   CHOL 128 01/09/2016 08:25 AM   TRIG 182 (H) 07/26/2023 01:43 PM   HDL 47 07/26/2023 01:43 PM   HDL 43 01/09/2016 08:25 AM   CHOLHDL 3.3 07/26/2023 01:43 PM   LDLCALC 80 07/26/2023 01:43 PM    Wt Readings from Last 3 Encounters:  01/28/24 206 lb 2 oz (93.5 kg)  12/28/23 210 lb 12.8 oz (95.6 kg)  07/26/23 221 lb 12.8 oz (100.6 kg)     Exam:    Vital Signs: Vital signs may also be detailed in the HPI BP 100/64 (BP Location: Left Arm, Patient Position: Sitting, Cuff Size: Normal)   Pulse 78   Ht 6' (1.829 m)   Wt 206  lb 2 oz (93.5 kg)   SpO2 97%   BMI 27.96 kg/m   Constitutional:  oriented to person, place, and time. No distress.  HENT:  Head: Grossly normal Eyes:  no discharge. No scleral icterus.  Neck: No JVD, no carotid bruits  Cardiovascular: Regular rate and rhythm, no murmurs  appreciated Pulmonary/Chest: Clear to auscultation bilaterally, no wheezes or rails Abdominal: Soft.  no distension.  no tenderness.  Musculoskeletal: Normal range of motion Neurological:  normal muscle tone. Coordination normal. No atrophy Skin: Skin warm and dry Psychiatric: normal affect, pleasant  ASSESSMENT & PLAN:    Problem List Items Addressed This Visit       Cardiology Problems   Paroxysmal tachycardia (HCC)   Relevant Orders   EKG 12-Lead (Completed)   Atherosclerosis of native coronary artery of native heart with stable angina pectoris (HCC) - Primary   Relevant Orders   EKG 12-Lead (Completed)   Cardiomyopathy (HCC)   Relevant Orders   EKG 12-Lead (Completed)   Essential hypertension, benign   Relevant Orders   EKG 12-Lead (Completed)   Hyperlipidemia     Other   History of tobacco abuse   History of atrial septal defect repair   Other Visit Diagnoses       Chronic systolic heart failure (HCC)       Relevant Orders   EKG 12-Lead (Completed)     Paroxysmal SVT (supraventricular tachycardia) (HCC)         Preop cardiovascular evaluation Acceptable risk for colonoscopy Recommend morning of procedure he will hold the isosorbide  losartan  and spironolactone  given low blood pressure  Tachycardia Continue metoprolol  succinate 100 daily  CAD with stable angina Currently with no symptoms of angina. No further workup at this time. Continue current medication regimen. For worsening shortness of breath recommend he call our office, stress testing could be performed  Shortness of breath Likely secondary to deconditioning, recommend once his back pain improves he start a regular exercise program  Hyperlipidemia Cholesterol is at goal on the current lipid regimen. No changes to the medications were made.  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

## 2024-01-27 ENCOUNTER — Other Ambulatory Visit: Payer: Self-pay | Admitting: Cardiovascular Disease

## 2024-01-28 ENCOUNTER — Ambulatory Visit: Payer: Medicare HMO | Attending: Cardiovascular Disease | Admitting: Cardiovascular Disease

## 2024-01-28 ENCOUNTER — Encounter: Payer: Self-pay | Admitting: Cardiovascular Disease

## 2024-01-28 ENCOUNTER — Telehealth: Payer: Self-pay | Admitting: Gastroenterology

## 2024-01-28 VITALS — BP 100/64 | HR 78 | Ht 72.0 in | Wt 206.1 lb

## 2024-01-28 DIAGNOSIS — E782 Mixed hyperlipidemia: Secondary | ICD-10-CM

## 2024-01-28 DIAGNOSIS — I10 Essential (primary) hypertension: Secondary | ICD-10-CM | POA: Diagnosis not present

## 2024-01-28 DIAGNOSIS — I5022 Chronic systolic (congestive) heart failure: Secondary | ICD-10-CM

## 2024-01-28 DIAGNOSIS — I471 Supraventricular tachycardia, unspecified: Secondary | ICD-10-CM | POA: Diagnosis not present

## 2024-01-28 DIAGNOSIS — I479 Paroxysmal tachycardia, unspecified: Secondary | ICD-10-CM | POA: Diagnosis not present

## 2024-01-28 DIAGNOSIS — Z87891 Personal history of nicotine dependence: Secondary | ICD-10-CM | POA: Diagnosis not present

## 2024-01-28 DIAGNOSIS — I255 Ischemic cardiomyopathy: Secondary | ICD-10-CM | POA: Diagnosis not present

## 2024-01-28 DIAGNOSIS — Z8774 Personal history of (corrected) congenital malformations of heart and circulatory system: Secondary | ICD-10-CM | POA: Diagnosis not present

## 2024-01-28 DIAGNOSIS — I25118 Atherosclerotic heart disease of native coronary artery with other forms of angina pectoris: Secondary | ICD-10-CM

## 2024-01-28 MED ORDER — NA SULFATE-K SULFATE-MG SULF 17.5-3.13-1.6 GM/177ML PO SOLN: 1.0000 | Freq: Once | ORAL | 0 refills | Status: AC

## 2024-01-28 NOTE — Patient Instructions (Signed)

## 2024-01-28 NOTE — Telephone Encounter (Signed)
 Inbound call from [facility], stating Suflave  is not covered under patients formulary for his upcoming procedure on 5/1. Requesting suprep be sent in instead.

## 2024-01-28 NOTE — Telephone Encounter (Signed)
 Sent script for [employer]. New instructions sent via Mychart.

## 2024-01-31 ENCOUNTER — Encounter: Payer: Self-pay | Admitting: Nurse Practitioner

## 2024-01-31 ENCOUNTER — Ambulatory Visit (INDEPENDENT_AMBULATORY_CARE_PROVIDER_SITE_OTHER): Payer: Medicare HMO | Admitting: Nurse Practitioner

## 2024-01-31 VITALS — BP 124/86 | HR 75 | Temp 97.0°F | Resp 16 | Ht 72.0 in | Wt 205.4 lb

## 2024-01-31 DIAGNOSIS — K5909 Other constipation: Secondary | ICD-10-CM | POA: Diagnosis not present

## 2024-01-31 DIAGNOSIS — S32010D Wedge compression fracture of first lumbar vertebra, subsequent encounter for fracture with routine healing: Secondary | ICD-10-CM | POA: Diagnosis not present

## 2024-01-31 DIAGNOSIS — K635 Polyp of colon: Secondary | ICD-10-CM

## 2024-01-31 DIAGNOSIS — G4733 Obstructive sleep apnea (adult) (pediatric): Secondary | ICD-10-CM

## 2024-01-31 DIAGNOSIS — R739 Hyperglycemia, unspecified: Secondary | ICD-10-CM | POA: Diagnosis not present

## 2024-01-31 DIAGNOSIS — I10 Essential (primary) hypertension: Secondary | ICD-10-CM

## 2024-01-31 DIAGNOSIS — M1A9XX0 Chronic gout, unspecified, without tophus (tophi): Secondary | ICD-10-CM

## 2024-01-31 DIAGNOSIS — I479 Paroxysmal tachycardia, unspecified: Secondary | ICD-10-CM | POA: Diagnosis not present

## 2024-01-31 DIAGNOSIS — I25118 Atherosclerotic heart disease of native coronary artery with other forms of angina pectoris: Secondary | ICD-10-CM

## 2024-01-31 DIAGNOSIS — F325 Major depressive disorder, single episode, in full remission: Secondary | ICD-10-CM

## 2024-01-31 DIAGNOSIS — G4700 Insomnia, unspecified: Secondary | ICD-10-CM | POA: Diagnosis not present

## 2024-01-31 DIAGNOSIS — E782 Mixed hyperlipidemia: Secondary | ICD-10-CM

## 2024-01-31 DIAGNOSIS — K621 Rectal polyp: Secondary | ICD-10-CM

## 2024-01-31 NOTE — Assessment & Plan Note (Signed)
 Follow up A1c at this time. Encouraged dietary modifications and to increase activity as he is able.

## 2024-01-31 NOTE — Progress Notes (Signed)
 Careteam: Patient Care Team: Verma Gobble, NP as PCP - General (Geriatric Medicine) [place], Peter M, MD as PCP - Cardiology (Cardiology) Emilia Harbour, [place] as Referring Physician (Dermatology) Rogers Clayman, MD as Referring Physician (Otolaryngology) Rosa College, MD as Consulting Physician (Ophthalmology)  PLACE OF SERVICE:  [facility]  Advanced Directive information Does Patient Have a Medical Advance Directive?: Yes, Type of Advance Directive: Healthcare Power of [place];Living will, Does patient want to make changes to medical advance directive?: No - Patient declined  Allergies  Allergen Reactions   Pollen Extract Other (See Comments)    Stuffy nose/itchy throat (Seasonal allergies)    Chief Complaint  Patient presents with   Medical Management of Chronic Issues    6 month follow up. Discuss the need for Colonoscopy.     HPI:  Discussed the use of AI scribe software for clinical note transcription with the patient, who gave verbal consent to proceed.  History of Present Illness   Mitchell Stephens. is a 78 year old male who presents for a six-month follow-up.  He recently visited his cardiologist, where his blood pressure was recorded at 100/64 mmHg. Today, his blood pressure is 124/86 mmHg  He is scheduled for a colonoscopy on Thursday and is fasting for blood work today. He is concerned about his A1c levels due to recent high sugar consumption. He mentions his weight is down and he has started exercising again.  He has a history of a compression fracture at L1, which occurred on July 17th, 2024. He completed eight weeks of physical therapy and reports that his back is improving, allowing him to walk up to a mile, although he experienced pain after walking a mile one day. He is gradually increasing his activity level.  No recent gout flares and he has been eating more chicken and less red meat.   He is on Zoloft  for anxiety and depression, which  is well-controlled.   He takes Senna for constipation, which is effective.  He uses a CPAP machine for sleep apnea and takes trazodone  for insomnia, which he uses almost every night. He recently went a few nights without it and slept well. He is considering reducing the dose from 50 mg to 25 mg.  He reports shortness of breath, which he attributes to being out of shape due to reduced physical activity. He notes that increased movement is helping his back improve.  He has a recent injury from a dog scratch, which is sensitive and has some scar tissue. He is concerned about a possible infection as it is swollen and painful.  No recent chest pains, worsening anxiety or depression, or recent gout flares.       Review of Systems:  Review of Systems  Constitutional:  Negative for chills, fever and weight loss.  HENT:  Negative for tinnitus.   Respiratory:  Negative for cough, sputum production and shortness of breath.   Cardiovascular:  Negative for chest pain, palpitations and leg swelling.  Gastrointestinal:  Negative for abdominal pain, constipation, diarrhea and heartburn.  Genitourinary:  Negative for dysuria, frequency and urgency.  Musculoskeletal:  Negative for back pain, falls, joint pain and myalgias.  Skin: Negative.   Neurological:  Negative for dizziness and headaches.  Psychiatric/Behavioral:  Negative for depression and memory loss. The patient does not have insomnia.     Past Medical History:  Diagnosis Date   Actinic keratosis 02/10/2021   left ear mid antihelix   Alcoholism (HCC)  per pt   Allergy    Basal cell carcinoma 05/13/2020   left prox medial pretibial    Basal cell carcinoma 08/26/2020   below the left medial knee - tx with ED&C    Basal cell carcinoma 02/10/2021   Right lower eyelid/lat canthus Moh's 08/07/2021   Basal cell carcinoma 12/17/2021   left epigastric, superficial- EDC   Basal cell carcinoma 06/03/2022   left lateral elbow, EDC 06/24/2022    Basal cell carcinoma 06/02/2023   Left upper arm. Superficial and focal infiltration. Excised 06/23/23   Basal cell carcinoma 06/02/2023   Right upper arm. Nodular and infiltrative. Excision 06/30/23   Coronary artery disease    a. 06/2017 MV: EF 45%, large inf and apical defect w/o ischemia;  b. 06/2017 Cath: LM nl, LAD 30ost, LCX 50p, OM1/2 ok, RCA 100p w/ L->R collats. EF 35%.   Depression with anxiety    Gastric ulcer, acute with hemorrhage 2001   Dr. Nicolette Barrio, [facility]   H/O congenital atrial septal defect (ASD) repair    Heart murmur    ASD repair    Helicobacter pylori gastritis    Heme positive stool    History of atrial septal defect repair 10/08/2016   History of gout    History of MRSA infection    History of peptic ulcer disease    had bleeding ulcers requiring transfusion   History of PSVT (paroxysmal supraventricular tachycardia)    History of tobacco abuse 12/13/2017   Hyperlipidemia    Hypertension    Ischemic cardiomyopathy    a. 06/2017 LV gram: EF 35%.   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   eft chest parasternal sup, txt'd EDC   Squamous cell carcinoma of skin 01/29/2020   right clavicle   Squamous cell carcinoma of skin 08/13/2021   R lat pectoral - EDC 09/24/2021   Squamous cell carcinoma of skin 11/20/2021   L chest parasternal sup - tx with ED&C   Squamous cell carcinoma of skin 11/20/2021   L chest parasternal inf - tx with ED&C 12/17/21   Squamous cell carcinoma of skin 11/20/2021   R Xyphoid - tx with [facility] 12/17/21   Tobacco abuse    Past Surgical History:  Procedure Laterality Date   ASD REPAIR, OSTIUM PRIMUM  1966   basal cell carcinoma  removal     COLONOSCOPY     EAR BIOPSY Left 2013   Dr.Cook--squamous cell ca ([facility])   LEFT HEART CATH AND CORONARY ANGIOGRAPHY Left 06/30/2017   Procedure: LEFT HEART CATH AND CORONARY ANGIOGRAPHY;  Surgeon: Devorah Fonder, MD;  Location: [facility] INVASIVE CV LAB;  Service: Cardiovascular;  Laterality: Left;   MOHS SURGERY     ear   open heart surgery  1966   Dr. Linder Revere ([facility]); was PFO repair--has been normal in f/u   PALATE / UVULA BIOPSY / EXCISION  2000   Dr.[facility])   TONSILLECTOMY AND ADENOIDECTOMY  1959   TOOTH EXTRACTION  01/06/2023   Dentist   UVULECTOMY     Social History:   reports that he quit smoking about 9 years ago. His smoking  use included cigarettes. He started smoking about 49 years ago. He has never used smokeless tobacco. He reports that he does not drink alcohol and does not use drugs.  Family History  Problem Relation Age of Onset   Failure to thrive Mother    Arthritis Mother    Other Mother        Colectomy   Brain cancer Father    Hypertension Sister    Brain cancer Sister    Other Sister        Blastoma   Stomach cancer Maternal Grandmother    Cancer Paternal Grandfather        type unknown   Liver cancer Paternal Uncle    Other Other        mother's siblings died of cancer ( abdominal)   Colon cancer Neg Hx    Colon polyps Neg Hx    Esophageal cancer Neg Hx    Rectal cancer Neg Hx     Medications: Patient's Medications  New Prescriptions   No medications on file  Previous Medications   ASPIRIN  EC 81 MG TABLET    Take 81 mg by mouth daily.   BUPROPION  (WELLBUTRIN  XL) 300 MG 24 HR TABLET    TAKE ONE TABLET BY MOUTH ONE TIME DAILY   COLCHICINE  0.6 MG TABLET    Take 1 tablet (0.6 mg total) by mouth as needed.   EZETIMIBE  (ZETIA ) 10 MG TABLET    TAKE ONE TABLET BY MOUTH ONE TIME DAILY   HYDROCHLOROTHIAZIDE  (HYDRODIURIL ) 25 MG TABLET    TAKE 1 TABLET (25 MG TOTAL) BY MOUTH DAILY AS NEEDED.   ISOSORBIDE  MONONITRATE (IMDUR ) 30 MG 24 HR  TABLET    TAKE 1/2 OF A TABLET (15 MG TOTAL) BY MOUTH DAILY   LOSARTAN  (COZAAR ) 25 MG TABLET    TAKE ONE TABLET BY MOUTH ONE TIME DAILY   METOPROLOL  SUCCINATE (TOPROL -XL) 100 MG 24 HR TABLET    TAKE ONE TABLET BY MOUTH ONE TIME DAILY WITH OR IMMEDIATELY FOLLOWING A MEAL   NITROGLYCERIN  (NITROSTAT ) 0.4 MG SL TABLET    Place 1 tablet (0.4 mg total) under the tongue every 5 (five) minutes as needed for chest pain.   PROPRANOLOL  (INDERAL ) 20 MG TABLET    TAKE 1 TABLET (20 MG TOTAL) BY MOUTH AS NEEDED (AS NEEDED FOR FAST HEART RATES).   SENNA (SENOKOT) 8.6 MG TABLET    Take 2 tablets by mouth at bedtime.   SERTRALINE  (ZOLOFT ) 50 MG TABLET    Take 1 tablet (50 mg total) by mouth daily.   SIMVASTATIN  (ZOCOR ) 40 MG TABLET    Take 1 tablet (40 mg total) by mouth daily.   SPIRONOLACTONE  (ALDACTONE ) 25 MG TABLET    Take 1 tablet (25 mg total) by mouth daily.   TRAZODONE  (DESYREL ) 50 MG TABLET    Take 1 tablet (50 mg total) by mouth at bedtime as needed. for insomnia  Modified Medications   No medications on file  Discontinued Medications   No medications on file    Physical Exam:  Vitals:   01/31/24 1259  BP: 124/86  Pulse: 75  Resp: 16  Temp: (!) 97 F (36.1 C)  SpO2: 97%  Weight: 205 lb 6.4 oz (93.2 kg)  Height: 6' (1.829 m)   Body mass index is 27.86 kg/m. Wt Readings from Last 3 Encounters:  01/31/24 205 lb 6.4 oz (93.2 kg)  01/28/24 206 lb 2 oz (93.5 kg)  12/28/23 210 lb 12.8 oz (95.6 kg)  Physical Exam Constitutional:      General: He is not in acute distress.    Appearance: He is well-developed. He is not diaphoretic.  HENT:     Head: Normocephalic and atraumatic.     Right Ear: External ear normal.     Left Ear: External ear normal.     Mouth/Throat:     Pharynx: No oropharyngeal exudate.  Eyes:     Conjunctiva/sclera: Conjunctivae normal.     Pupils: Pupils are equal, round, and reactive to light.  Cardiovascular:     Rate and Rhythm: Normal rate and regular rhythm.      Heart sounds: Normal heart sounds.  Pulmonary:     Effort: Pulmonary effort is normal.     Breath sounds: Normal breath sounds.  Abdominal:     General: Bowel sounds are normal.     Palpations: Abdomen is soft.  Musculoskeletal:        General: No tenderness.     Cervical back: Normal range of motion and neck supple.     Right lower leg: No edema.     Left lower leg: No edema.  Skin:    General: Skin is warm and dry.  Neurological:     Mental Status: He is alert and oriented to person, place, and time.     Labs reviewed: Basic Metabolic Panel: Recent Labs    07/26/23 1343  NA 138  K 4.8  CL 101  CO2 28  GLUCOSE 117*  BUN 23  CREATININE 1.46*  CALCIUM 9.7   Liver Function Tests: Recent Labs    07/26/23 1343  AST 16  ALT 16  BILITOT 0.5  PROT 6.5   No results for input(s): "LIPASE", "AMYLASE" in the last 8760 hours. No results for input(s): "AMMONIA" in the last 8760 hours. CBC: Recent Labs    07/26/23 1343  WBC 11.7*  NEUTROABS 8,050*  HGB 14.0  HCT 43.6  MCV 94.4  PLT 336   Lipid Panel: Recent Labs    07/26/23 1343  CHOL 155  HDL 47  LDLCALC 80  TRIG 182*  CHOLHDL 3.3   TSH: No results for input(s): "TSH" in the last 8760 hours. A1C: Lab Results  Component Value Date   HGBA1C 6.5 (H) 07/26/2023     Assessment/Plan Essential hypertension, benign Assessment & Plan: Blood pressure well controlled, goal bp <140/90 Continue current medications and dietary modifications follow metabolic panel  Orders: -     COMPLETE METABOLIC PANEL WITHOUT GFR -     CBC with Differential/Platelet  Paroxysmal tachycardia (HCC) Assessment & Plan: Stable on metoprolol  with PRN propranolol     Mixed hyperlipidemia Assessment & Plan: Continues on zocor  daily with dietary modifications   Orders: -     Lipid panel  Colorectal polyps  Follow up colonoscopy scheduled  Hyperglycemia Assessment & Plan: Follow up A1c at this time. Encouraged  dietary modifications and to increase activity as he is able.   Orders: -     Hemoglobin A1c  Atherosclerosis of native coronary artery of native heart with stable angina pectoris [facility]) Assessment & Plan: Stable, continues on ASA, isosorbide , and metoprolol    Closed compression fracture of L1 lumbar vertebra with routine healing, subsequent encounter  Ongoing pain but making progress with mobility.   Depression, major, in remission (HCC) Assessment & Plan: Well managed on zoloft  50 mg daily    Chronic constipation Assessment & Plan: Well managed with senokot 2 tablets at bedtime   Obstructive sleep apnea on  CPAP Assessment & Plan: Continue CPAP   Insomnia, unspecified type  Stable, using trazodone , can decrease to 25 mg at bedtime and to decrease to as needed if able.   Chronic gout without tophus, unspecified cause, unspecified site Assessment & Plan: No recent flares. Diet controlled.   Orders: -     Uric acid  Return in about 6 months (around 08/01/2024) for routine follow up, labs at time of visit.  Mitchell Stephens K. Denney Fisherman [facility] [phone]

## 2024-01-31 NOTE — Assessment & Plan Note (Signed)
 Blood pressure well controlled, goal bp <140/90 Continue current medications and dietary modifications follow metabolic panel

## 2024-01-31 NOTE — Assessment & Plan Note (Signed)
 Continues on zocor  daily with dietary modifications

## 2024-01-31 NOTE — Assessment & Plan Note (Signed)
 No recent flares. Diet controlled.

## 2024-01-31 NOTE — Assessment & Plan Note (Signed)
 Continue CPAP.

## 2024-01-31 NOTE — Assessment & Plan Note (Signed)
 Stable, continues on ASA, isosorbide , and metoprolol 

## 2024-01-31 NOTE — Assessment & Plan Note (Signed)
 Well managed on zoloft  50 mg daily

## 2024-01-31 NOTE — Assessment & Plan Note (Signed)
 Stable on metoprolol  with PRN propranolol 

## 2024-01-31 NOTE — Assessment & Plan Note (Signed)
 Well managed with senokot 2 tablets at bedtime

## 2024-02-01 ENCOUNTER — Encounter: Payer: Self-pay | Admitting: Nurse Practitioner

## 2024-02-01 LAB — CBC WITH DIFFERENTIAL/PLATELET
Absolute Lymphocytes: 1637 {cells}/uL (ref 850–3900)
Absolute Monocytes: 757 {cells}/uL (ref 200–950)
Basophils Absolute: 70 {cells}/uL (ref 0–200)
Basophils Relative: 0.8 %
Eosinophils Absolute: 299 {cells}/uL (ref 15–500)
Eosinophils Relative: 3.4 %
HCT: 42.6 % (ref 38.5–50.0)
Hemoglobin: 13.9 g/dL (ref 13.2–17.1)
MCH: 30.1 pg (ref 27.0–33.0)
MCHC: 32.6 g/dL (ref 32.0–36.0)
MCV: 92.2 fL (ref 80.0–100.0)
MPV: 9.5 fL (ref 7.5–12.5)
Monocytes Relative: 8.6 %
Neutro Abs: 6037 {cells}/uL (ref 1500–7800)
Neutrophils Relative %: 68.6 %
Platelets: 332 10*3/uL (ref 140–400)
RBC: 4.62 10*6/uL (ref 4.20–5.80)
RDW: 12.9 % (ref 11.0–15.0)
Total Lymphocyte: 18.6 %
WBC: 8.8 10*3/uL (ref 3.8–10.8)

## 2024-02-01 LAB — COMPLETE METABOLIC PANEL WITHOUT GFR
AG Ratio: 1.9 (calc) (ref 1.0–2.5)
ALT: 15 U/L (ref 9–46)
AST: 17 U/L (ref 10–35)
Albumin: 4.3 g/dL (ref 3.6–5.1)
Alkaline phosphatase (APISO): 46 U/L (ref 35–144)
BUN/Creatinine Ratio: 13 (calc) (ref 6–22)
BUN: 17 mg/dL (ref 7–25)
CO2: 28 mmol/L (ref 20–32)
Calcium: 9.8 mg/dL (ref 8.6–10.3)
Chloride: 100 mmol/L (ref 98–110)
Creat: 1.34 mg/dL — ABNORMAL HIGH (ref 0.70–1.28)
Globulin: 2.3 g/dL (ref 1.9–3.7)
Glucose, Bld: 95 mg/dL (ref 65–99)
Potassium: 4.8 mmol/L (ref 3.5–5.3)
Sodium: 134 mmol/L — ABNORMAL LOW (ref 135–146)
Total Bilirubin: 0.6 mg/dL (ref 0.2–1.2)
Total Protein: 6.6 g/dL (ref 6.1–8.1)

## 2024-02-01 LAB — HEMOGLOBIN A1C
Hgb A1c MFr Bld: 6.2 % — ABNORMAL HIGH (ref <5.7)
Mean Plasma Glucose: 131 mg/dL
eAG (mmol/L): 7.3 mmol/L

## 2024-02-01 LAB — LIPID PANEL
Cholesterol: 124 mg/dL (ref <200)
HDL: 49 mg/dL (ref >=40)
LDL Cholesterol (Calc): 52 mg/dL
Non-HDL Cholesterol (Calc): 75 mg/dL (ref <130)
Total CHOL/HDL Ratio: 2.5 (calc) (ref <5.0)
Triglycerides: 145 mg/dL (ref <150)

## 2024-02-01 LAB — URIC ACID: Uric Acid, Serum: 6.8 mg/dL (ref 4.0–8.0)

## 2024-02-03 ENCOUNTER — Encounter: Payer: Self-pay | Admitting: Gastroenterology

## 2024-02-03 ENCOUNTER — Ambulatory Visit: Admitting: Gastroenterology

## 2024-02-03 VITALS — BP 122/73 | HR 78 | Temp 97.9°F | Resp 13 | Ht 71.0 in | Wt 210.0 lb

## 2024-02-03 DIAGNOSIS — G4733 Obstructive sleep apnea (adult) (pediatric): Secondary | ICD-10-CM | POA: Diagnosis not present

## 2024-02-03 DIAGNOSIS — F418 Other specified anxiety disorders: Secondary | ICD-10-CM | POA: Diagnosis not present

## 2024-02-03 DIAGNOSIS — D123 Benign neoplasm of transverse colon: Secondary | ICD-10-CM | POA: Diagnosis not present

## 2024-02-03 DIAGNOSIS — I2510 Atherosclerotic heart disease of native coronary artery without angina pectoris: Secondary | ICD-10-CM | POA: Diagnosis not present

## 2024-02-03 DIAGNOSIS — Z1211 Encounter for screening for malignant neoplasm of colon: Secondary | ICD-10-CM | POA: Diagnosis not present

## 2024-02-03 DIAGNOSIS — D126 Benign neoplasm of colon, unspecified: Secondary | ICD-10-CM | POA: Diagnosis not present

## 2024-02-03 DIAGNOSIS — Z860100 Personal history of colon polyps, unspecified: Secondary | ICD-10-CM

## 2024-02-03 DIAGNOSIS — K648 Other hemorrhoids: Secondary | ICD-10-CM | POA: Diagnosis not present

## 2024-02-03 DIAGNOSIS — D128 Benign neoplasm of rectum: Secondary | ICD-10-CM

## 2024-02-03 MED ORDER — SODIUM CHLORIDE 0.9 % IV SOLN: 500.0000 mL | Freq: Once | INTRAVENOUS | Status: DC

## 2024-02-03 NOTE — Progress Notes (Signed)
 Report given to PACU, vss

## 2024-02-03 NOTE — Patient Instructions (Signed)
 Resume previous diet Continue present medications  Await pathology results No need for repeat colonoscopy YOU HAD AN ENDOSCOPIC PROCEDURE TODAY AT THE [facility]:   Refer to the procedure report that was given to you for any specific questions about what was found during the examination.  If the procedure report does not answer your questions, please call your gastroenterologist to clarify.  If you requested that your care partner not be given the details of your procedure findings, then the procedure report has been included in a sealed envelope for you to review at your convenience later.  YOU SHOULD EXPECT: Some feelings of bloating in the abdomen. Passage of more gas than usual.  Walking can help get rid of the air that was put into your GI tract during the procedure and reduce the bloating. If you had a lower endoscopy (such as a colonoscopy or flexible sigmoidoscopy) you may notice spotting of blood in your stool or on the toilet paper. If you underwent a bowel prep for your procedure, you may not have a normal bowel movement for a few days.  Please Note:  You might notice some irritation and congestion in your nose or some drainage.  This is from the oxygen used during your procedure.  There is no need for concern and it should clear up in a day or so.  SYMPTOMS TO REPORT IMMEDIATELY:  Following lower endoscopy (colonoscopy or flexible sigmoidoscopy):  Excessive amounts of blood in the stool  Significant tenderness or worsening of abdominal pains  Swelling of the abdomen that is new, acute  Fever of 100F or higher  For urgent or emergent issues, a gastroenterologist can be reached at any hour by calling [phone]. Do not use MyChart messaging for urgent concerns.   DIET:  We do recommend a small meal at first, but then you may proceed to your regular diet.  Drink plenty of fluids but you should avoid alcoholic beverages for 24 hours.  ACTIVITY:  You should plan to  take it easy for the rest of today and you should NOT DRIVE or use heavy machinery until tomorrow (because of the sedation medicines used during the test).    FOLLOW UP: Our staff will call the number listed on your records the next business day following your procedure.  We will call around 7:15- 8:00 am to check on you and address any questions or concerns that you may have regarding the information given to you following your procedure. If we do not reach you, we will leave a message.     If any biopsies were taken you will be contacted by phone or by letter within the next 1-3 weeks.  Please call us  at [phone] if you have not heard about the biopsies in 3 weeks.   SIGNATURES/CONFIDENTIALITY: You and/or your care partner have signed paperwork which will be entered into your electronic medical record.  These signatures attest to the fact that that the information above on your After Visit Summary has been reviewed and is understood.  Full responsibility of the confidentiality of this discharge information lies with you and/or your care-partner.

## 2024-02-03 NOTE — Op Note (Signed)
 [facility] Patient Name: Glendal Marks Procedure Date: 02/03/2024 1:48 PM MRN: [id number] Endoscopist: Ace Abu L. Dominic Friendly , MD, [id number] Age: 78 Referring MD:  Date of Birth: June 14, 1946 Gender: Male Account #: [id number] Procedure:                Colonoscopy Indications:              Surveillance: Personal history of adenomatous                            polyps on last colonoscopy 3 years ago                           10-11 tubular adenomas last colonoscopy April 2022                            (Dr. Savannah Curlin)                           Reportedly no polyps 2016                           Tubulovillous adenoma 2012 Medicines:                Monitored Anesthesia Care Procedure:                Pre-Anesthesia Assessment:                           - Prior to the procedure, a History and Physical                            was performed, and patient medications and                            allergies were reviewed. The patient's tolerance of                            previous anesthesia was also reviewed. The risks                            and benefits of the procedure and the sedation                            options and risks were discussed with the patient.                            All questions were answered, and informed consent                            was obtained. Prior Anticoagulants: The patient has                            taken no anticoagulant or antiplatelet agents. ASA                            Grade Assessment:  III - A patient with severe                            systemic disease. After reviewing the risks and                            benefits, the patient was deemed in satisfactory                            condition to undergo the procedure.                           After obtaining informed consent, the colonoscope                            was passed under direct vision. Throughout the                            procedure, the patient's  blood pressure, pulse, and                            oxygen saturations were monitored continuously. The                            Olympus Scope SN [id number] was introduced through the                            anus and advanced to the the cecum, identified by                            appendiceal orifice and ileocecal valve. The                            colonoscopy was performed without difficulty. The                            patient tolerated the procedure well. The quality                            of the bowel preparation was good after lavage.                            There was some fibrous debris (seeds and other) in                            the cecal cap that could not be completely cleared                            and initially clogged the scope and required brush                            clearance of the suction channel. (See photo).                            (  Patient had consumed fruit yesterday) The                            ileocecal valve, appendiceal orifice, and rectum                            were photographed. Scope In: 1:52:13 PM Scope Out: 2:15:31 PM Scope Withdrawal Time: 0 hours 16 minutes 21 seconds  Total Procedure Duration: 0 hours 23 minutes 18 seconds  Findings:                 The perianal and digital rectal examinations were                            normal.                           Two sessile polyps were found in the distal rectum                            and transverse colon. The polyps were diminutive in                            size. These polyps were removed with a cold snare.                            Resection and retrieval were complete.                           Repeat examination of right colon under NBI                            performed.                           Internal hemorrhoids were found. The hemorrhoids                            were small.                           The exam was otherwise without abnormality  on                            direct and retroflexion views. Complications:            No immediate complications. Estimated Blood Loss:     Estimated blood loss was minimal. Impression:               - Two diminutive polyps in the distal rectum and in                            the transverse colon, removed with a cold snare.                            Resected and retrieved.                           -  Internal hemorrhoids.                           - The examination was otherwise normal on direct                            and retroflexion views. Recommendation:           - Patient has a contact number available for                            emergencies. The signs and symptoms of potential                            delayed complications were discussed with the                            patient. Return to normal activities tomorrow.                            Written discharge instructions were provided to the                            patient.                           - Resume previous diet.                           - Continue present medications.                           - Await pathology results.                           - No repeat colonoscopy due to age, current                            guidelines and low risk findings today. Parrie Rasco L. Dominic Friendly, MD 02/03/2024 2:23:36 PM This report has been signed electronically.

## 2024-02-03 NOTE — Progress Notes (Signed)
 History and Physical:  This patient presents for endoscopic testing for: Encounter Diagnosis  Name Primary?   Hx of colonic polyps Yes    Surveillance colonoscopy today 10-11 TA last colonoscopy April 2022 (Dr Savannah Curlin) Patient denies chronic abdominal pain, rectal bleeding, constipation or diarrhea.   Patient is otherwise without complaints or active issues today.   Past Medical History: Past Medical History:  Diagnosis Date   Actinic keratosis 02/10/2021   left ear mid antihelix   Alcoholism (HCC)    per pt   Allergy    Basal cell carcinoma 05/13/2020   left prox medial pretibial    Basal cell carcinoma 08/26/2020   below the left medial knee - tx with ED&C    Basal cell carcinoma 02/10/2021   Right lower eyelid/lat canthus Moh's 08/07/2021   Basal cell carcinoma 12/17/2021   left epigastric, superficial- EDC   Basal cell carcinoma 06/03/2022   left lateral elbow, EDC 06/24/2022   Basal cell carcinoma 06/02/2023   Left upper arm. Superficial and focal infiltration. Excised 06/23/23   Basal cell carcinoma 06/02/2023   Right upper arm. Nodular and infiltrative. Excision 06/30/23   Coronary artery disease    a. 06/2017 MV: EF 45%, large inf and apical defect w/o ischemia;  b. 06/2017 Cath: LM nl, LAD 30ost, LCX 50p, OM1/2 ok, RCA 100p w/ L->R collats. EF 35%.   Depression with anxiety    Gastric ulcer, acute with hemorrhage 2001   Dr. Nicolette Barrio, [facility]   H/O congenital atrial septal defect (ASD) repair    asd repair   Heart murmur    ASD repair    Helicobacter pylori gastritis    Heme positive stool    History of atrial septal defect repair 10/08/2016   History of gout    History of MRSA infection    History of peptic ulcer disease    had bleeding ulcers requiring transfusion   History of PSVT (paroxysmal supraventricular tachycardia)    History of tobacco abuse 12/13/2017   Hyperlipidemia    Hypertension    Ischemic cardiomyopathy    a. 06/2017 LV gram: EF 35%.    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   eft chest parasternal sup, txt'd EDC   Squamous cell carcinoma of skin 01/29/2020   right clavicle   Squamous cell carcinoma of skin 08/13/2021   R lat pectoral - EDC 09/24/2021   Squamous cell carcinoma of skin 11/20/2021   L chest parasternal sup - tx with ED&C   Squamous cell carcinoma of skin 11/20/2021   L chest parasternal inf - tx with ED&C 12/17/21   Squamous cell carcinoma of skin 11/20/2021   R Xyphoid - [facility] with [facility] 12/17/21   Tobacco abuse      Past Surgical History: Past Surgical History:  Procedure Laterality Date   ASD REPAIR, OSTIUM PRIMUM  1966   basal cell carcinoma removal     COLONOSCOPY     EAR BIOPSY Left 2013   Dr.Cook--squamous cell ca ([facility])   LEFT HEART CATH AND CORONARY ANGIOGRAPHY Left 06/30/2017   Procedure: LEFT HEART CATH AND CORONARY ANGIOGRAPHY;  Surgeon: Jerelene Monday,  Deadra Everts, MD;  Location: [facility] INVASIVE CV LAB;  Service: Cardiovascular;  Laterality: Left;   MOHS SURGERY     ear   open heart surgery  1966   Dr. Linder Revere ([facility]); was PFO repair--has been normal in f/u   PALATE / UVULA BIOPSY / EXCISION  2000   Dr.Sprenhe [facility])   TONSILLECTOMY AND ADENOIDECTOMY  1959   TOOTH EXTRACTION  01/06/2023   Dentist   UVULECTOMY      Allergies: Allergies  Allergen Reactions   Pollen Extract Other (See Comments)    Stuffy nose/itchy throat (Seasonal allergies)    Outpatient Meds: Current Outpatient Medications  Medication Sig Dispense Refill   aspirin  EC 81 MG tablet Take 81 mg by mouth daily.     buPROPion  (WELLBUTRIN  XL) 300 MG 24 hr tablet TAKE ONE TABLET BY MOUTH ONE TIME DAILY 90 tablet 1    ezetimibe  (ZETIA ) 10 MG tablet TAKE ONE TABLET BY MOUTH ONE TIME DAILY 90 tablet 1   hydrochlorothiazide  (HYDRODIURIL ) 25 MG tablet TAKE 1 TABLET (25 MG TOTAL) BY MOUTH DAILY AS NEEDED. 90 tablet 3   isosorbide  mononitrate (IMDUR ) 30 MG 24 hr tablet TAKE 1/2 OF A TABLET (15 MG TOTAL) BY MOUTH DAILY 45 tablet 1   losartan  (COZAAR ) 25 MG tablet TAKE ONE TABLET BY MOUTH ONE TIME DAILY 90 tablet 1   metoprolol  succinate (TOPROL -XL) 100 MG 24 hr tablet TAKE ONE TABLET BY MOUTH ONE TIME DAILY WITH OR IMMEDIATELY FOLLOWING A MEAL 90 tablet 1   senna (SENOKOT) 8.6 MG tablet Take 2 tablets by mouth at bedtime.     sertraline  (ZOLOFT ) 50 MG tablet Take 1 tablet (50 mg total) by mouth daily. 90 tablet 3   simvastatin  (ZOCOR ) 40 MG tablet Take 1 tablet (40 mg total) by mouth daily. 90 tablet 1   spironolactone  (ALDACTONE ) 25 MG tablet Take 1 tablet (25 mg total) by mouth daily. 90 tablet 1   traZODone  (DESYREL ) 50 MG tablet Take 1 tablet (50 mg total) by mouth at bedtime as needed. for insomnia 90 tablet 1   colchicine  0.6 MG tablet Take 1 tablet (0.6 mg total) by mouth as needed. 30 tablet 5   nitroGLYCERIN  (NITROSTAT ) 0.4 MG SL tablet Place 1 tablet (0.4 mg total) under the tongue every 5 (five) minutes as needed for chest pain. (Patient not taking: Reported on 02/03/2024) 25 tablet 1   propranolol  (INDERAL ) 20 MG tablet TAKE 1 TABLET (20 MG TOTAL) BY MOUTH AS NEEDED (AS NEEDED FOR FAST HEART RATES). 90 tablet 1   Current Facility-Administered Medications  Medication Dose Route Frequency Provider Last Rate Last Admin   0.9 %  sodium chloride  infusion  500 mL Intravenous Once Danis, Levonia Wolfley L III, MD          ___________________________________________________________________ Objective   Exam:  BP (!) 142/88   Pulse 80   Temp 97.9 F (36.6 C) (Temporal)   Resp 16   Ht 5\' 11"  (1.803 m)   Wt 210 lb (95.3 kg)   SpO2 94%   BMI 29.29 kg/m   CV: regular , S1/S2 Resp: clear to auscultation  bilaterally, normal RR and effort noted GI: soft, no tenderness, with active bowel sounds.   Assessment: Encounter Diagnosis  Name Primary?   Hx of colonic polyps Yes     Plan: Colonoscopy   The benefits and risks of the planned procedure(s) were described in detail with the patient or (when appropriate) their health care proxy.  Risks were outlined  as including, but not limited to, bleeding, infection, perforation, adverse medication reaction leading to cardiac or pulmonary decompensation, pancreatitis (if ERCP).  The limitation of incomplete mucosal visualization was also discussed.  No guarantees or warranties were given.  The patient is appropriate for an endoscopic procedure in the ambulatory setting.   - Lorella Roles, MD

## 2024-02-03 NOTE — Progress Notes (Signed)
 Called to room to assist during endoscopic procedure.  Patient ID and intended procedure confirmed with present staff. Received instructions for my participation in the procedure from the performing physician.

## 2024-02-04 ENCOUNTER — Telehealth: Payer: Self-pay

## 2024-02-04 ENCOUNTER — Other Ambulatory Visit: Payer: Self-pay

## 2024-02-04 MED ORDER — TRAZODONE HCL 50 MG PO TABS: 50.0000 mg | ORAL_TABLET | Freq: Every evening | ORAL | 1 refills | Status: DC | PRN

## 2024-02-04 MED ORDER — SPIRONOLACTONE 25 MG PO TABS: 25.0000 mg | ORAL_TABLET | Freq: Every day | ORAL | 1 refills | Status: DC

## 2024-02-04 NOTE — Telephone Encounter (Signed)
 Pharmacy has request refill on medication. Warnings populate for refill. Routed to PCP Roselie Conger, Champ Coma, NP

## 2024-02-04 NOTE — Telephone Encounter (Signed)
 Follow up call to pt, no answer.

## 2024-02-07 NOTE — Telephone Encounter (Signed)
 Medications refilled by PCP Verma Gobble, NP

## 2024-02-08 LAB — SURGICAL PATHOLOGY

## 2024-02-15 ENCOUNTER — Ambulatory Visit: Payer: Self-pay | Admitting: Gastroenterology

## 2024-02-21 ENCOUNTER — Other Ambulatory Visit: Payer: Self-pay | Admitting: *Deleted

## 2024-02-21 MED ORDER — ISOSORBIDE MONONITRATE ER 30 MG PO TB24: ORAL_TABLET | ORAL | 1 refills | Status: DC

## 2024-02-21 NOTE — Telephone Encounter (Signed)
Pharmacy Requested refill.  

## 2024-03-08 ENCOUNTER — Other Ambulatory Visit: Payer: Self-pay | Admitting: Nurse Practitioner

## 2024-03-08 DIAGNOSIS — E782 Mixed hyperlipidemia: Secondary | ICD-10-CM

## 2024-03-09 DIAGNOSIS — H04123 Dry eye syndrome of bilateral lacrimal glands: Secondary | ICD-10-CM | POA: Diagnosis not present

## 2024-03-09 DIAGNOSIS — H2513 Age-related nuclear cataract, bilateral: Secondary | ICD-10-CM | POA: Diagnosis not present

## 2024-03-09 DIAGNOSIS — H538 Other visual disturbances: Secondary | ICD-10-CM | POA: Diagnosis not present

## 2024-03-09 DIAGNOSIS — Z85828 Personal history of other malignant neoplasm of skin: Secondary | ICD-10-CM | POA: Diagnosis not present

## 2024-03-09 DIAGNOSIS — H43813 Vitreous degeneration, bilateral: Secondary | ICD-10-CM | POA: Diagnosis not present

## 2024-03-15 DIAGNOSIS — D485 Neoplasm of uncertain behavior of skin: Secondary | ICD-10-CM | POA: Diagnosis not present

## 2024-03-15 DIAGNOSIS — C44622 Squamous cell carcinoma of skin of right upper limb, including shoulder: Secondary | ICD-10-CM | POA: Diagnosis not present

## 2024-04-04 ENCOUNTER — Encounter: Payer: Self-pay | Admitting: Anesthesiology

## 2024-04-04 ENCOUNTER — Encounter: Payer: Self-pay | Admitting: Ophthalmology

## 2024-04-10 NOTE — Discharge Instructions (Signed)

## 2024-05-15 ENCOUNTER — Ambulatory Visit: Admission: RE | Admit: 2024-05-15 | Source: Home / Self Care | Admitting: Ophthalmology

## 2024-05-15 SURGERY — PHACOEMULSIFICATION, CATARACT, WITH IOL INSERTION
Anesthesia: Topical | Laterality: Right

## 2024-05-17 DIAGNOSIS — C44622 Squamous cell carcinoma of skin of right upper limb, including shoulder: Secondary | ICD-10-CM | POA: Diagnosis not present

## 2024-05-17 DIAGNOSIS — D0461 Carcinoma in situ of skin of right upper limb, including shoulder: Secondary | ICD-10-CM | POA: Diagnosis not present

## 2024-05-29 ENCOUNTER — Ambulatory Visit: Admit: 2024-05-29 | Admitting: Ophthalmology

## 2024-05-29 SURGERY — PHACOEMULSIFICATION, CATARACT, WITH IOL INSERTION
Anesthesia: Topical | Laterality: Left

## 2024-06-13 DIAGNOSIS — H2513 Age-related nuclear cataract, bilateral: Secondary | ICD-10-CM | POA: Diagnosis not present

## 2024-06-21 DIAGNOSIS — H52222 Regular astigmatism, left eye: Secondary | ICD-10-CM | POA: Diagnosis not present

## 2024-06-21 DIAGNOSIS — H524 Presbyopia: Secondary | ICD-10-CM | POA: Diagnosis not present

## 2024-06-21 DIAGNOSIS — H25812 Combined forms of age-related cataract, left eye: Secondary | ICD-10-CM | POA: Diagnosis not present

## 2024-06-22 ENCOUNTER — Other Ambulatory Visit: Payer: Self-pay | Admitting: Nurse Practitioner

## 2024-06-22 DIAGNOSIS — I10 Essential (primary) hypertension: Secondary | ICD-10-CM

## 2024-06-23 ENCOUNTER — Telehealth: Payer: Self-pay | Admitting: Cardiovascular Disease

## 2024-06-23 DIAGNOSIS — I25118 Atherosclerotic heart disease of native coronary artery with other forms of angina pectoris: Secondary | ICD-10-CM

## 2024-06-23 MED ORDER — NITROGLYCERIN 0.4 MG SL SUBL: 0.4000 mg | SUBLINGUAL_TABLET | SUBLINGUAL | 7 refills | Status: AC | PRN

## 2024-06-23 NOTE — Telephone Encounter (Signed)
 High Risk Warning Populated when attempting to refill, I will send to Provider for further review

## 2024-06-23 NOTE — Telephone Encounter (Signed)
 Pt's medication was sent to pt's pharmacy as requested. Confirmation received.

## 2024-06-23 NOTE — Telephone Encounter (Signed)
*  STAT* If patient is at the pharmacy, call can be transferred to refill team.     1. Which medications need to be refilled? (please list name of each medication and dose if known)     nitroGLYCERIN  (NITROSTAT ) 0.4 MG SL tablet  2. Would you like to learn more about the convenience, safety, & potential cost savings by using the [facility]? no     3. Are you open to using the [facility] (Type [facility].  ).no     4. Which pharmacy/location (including street and city if local pharmacy) is medication to be sent to?    [facility] [address] - [place], [place] - [address]     5. Do they need a 30 day or 90 day supply? One bottle

## 2024-07-05 DIAGNOSIS — H52201 Unspecified astigmatism, right eye: Secondary | ICD-10-CM | POA: Diagnosis not present

## 2024-07-05 DIAGNOSIS — Z961 Presence of intraocular lens: Secondary | ICD-10-CM | POA: Diagnosis not present

## 2024-07-05 DIAGNOSIS — H524 Presbyopia: Secondary | ICD-10-CM | POA: Diagnosis not present

## 2024-07-05 DIAGNOSIS — Z9842 Cataract extraction status, left eye: Secondary | ICD-10-CM | POA: Diagnosis not present

## 2024-07-05 DIAGNOSIS — H25811 Combined forms of age-related cataract, right eye: Secondary | ICD-10-CM | POA: Diagnosis not present

## 2024-08-01 ENCOUNTER — Other Ambulatory Visit: Payer: Self-pay | Admitting: Cardiovascular Disease

## 2024-08-04 ENCOUNTER — Ambulatory Visit: Admitting: Nurse Practitioner

## 2024-08-07 ENCOUNTER — Encounter: Payer: Self-pay | Admitting: Nurse Practitioner

## 2024-08-07 ENCOUNTER — Ambulatory Visit (INDEPENDENT_AMBULATORY_CARE_PROVIDER_SITE_OTHER): Payer: Self-pay | Admitting: Nurse Practitioner

## 2024-08-07 VITALS — BP 112/72 | HR 66 | Temp 97.6°F | Ht 71.0 in | Wt 212.6 lb

## 2024-08-07 DIAGNOSIS — I479 Paroxysmal tachycardia, unspecified: Secondary | ICD-10-CM

## 2024-08-07 DIAGNOSIS — M1A9XX0 Chronic gout, unspecified, without tophus (tophi): Secondary | ICD-10-CM | POA: Diagnosis not present

## 2024-08-07 DIAGNOSIS — G8929 Other chronic pain: Secondary | ICD-10-CM

## 2024-08-07 DIAGNOSIS — M5442 Lumbago with sciatica, left side: Secondary | ICD-10-CM

## 2024-08-07 DIAGNOSIS — F325 Major depressive disorder, single episode, in full remission: Secondary | ICD-10-CM

## 2024-08-07 DIAGNOSIS — I10 Essential (primary) hypertension: Secondary | ICD-10-CM

## 2024-08-07 DIAGNOSIS — R739 Hyperglycemia, unspecified: Secondary | ICD-10-CM

## 2024-08-07 DIAGNOSIS — E782 Mixed hyperlipidemia: Secondary | ICD-10-CM

## 2024-08-07 MED ORDER — GABAPENTIN 100 MG PO CAPS: 100.0000 mg | ORAL_CAPSULE | Freq: Three times a day (TID) | ORAL | 1 refills | Status: AC | PRN

## 2024-08-07 NOTE — Progress Notes (Unsigned)
 Careteam: Patient Care Team: Caro Harlene POUR, NP as PCP - General (Geriatric Medicine) Jordan, Peter M, MD as PCP - Cardiology (Cardiology) Clayburn Heinrich, [place] as Referring Physician (Dermatology) Milissa Hamming, MD as Referring Physician (Otolaryngology) Myrna Adine Anes, MD as Consulting Physician (Ophthalmology)  PLACE OF SERVICE:  [facility]  Advanced Directive information    Allergies  Allergen Reactions   Pollen Extract Other (See Comments)    Stuffy nose/itchy throat (Seasonal allergies)    Chief Complaint  Patient presents with   Medical Management of Chronic Issues    6 month routine visit . - pt having problems walking pt thinks its resulting in the fall 1.5 years ago. Patient stated it hurts for him to walk long distances. Pt  received flu vaccine from publiz  , pt stated he'll get his COVID vaccine today.    HPI:  Discussed the use of AI scribe software for clinical note transcription with the patient, who gave verbal consent to proceed.  History of Present Illness Mitchell Stephens. Mitchell Stephens is a 78 year old male with a history of lumbar compression fracture who presents with worsening leg pain and difficulty walking.  He has been experiencing increased difficulty walking due to pain and weakness in his leg. The pain originates in the lower back, radiates down to the foot, and creates a sensation that his legs might collapse. This pain has persisted since a fall a year ago, which resulted in a broken coccyx and an acute compression fracture of L1.  He has a history of a bulging disc at L4-5, confirmed by imaging. An MRI was performed six weeks post-injury, though he is unsure of the details. He previously consulted with a neurosurgeon, who told him that there was nothing that could be done, and that he may never be the same.  He underwent two months of physical therapy, which he feels did not help and possibly worsened his condition. He wants to walk  again, as he has been unable to walk more than half a mile without pain for the past year. He is concerned about the impact of his sedentary lifestyle on his overall health, including an increased heart rate.  He has hx of CAD -s/p cardiac cath in 2018- currently taking medications for his heart, including metoprolol  100 mg, imdur  30 mg, losartan  25 mg, spironolactone  25 mg daily, hydrochlorothiazide  25 mg    and for cholesterol, including Zetia  and Zocor .   He also takes Wellbutrin  and Zoloft  for mood, which he reports as stable.  In the review of symptoms, he reports pain when walking, but not when sitting. No recent gout flares and no significant changes in bowel movements or urination patterns. He is concerned about his ability to remain active and maintain his health.   Review of Systems:  Review of Systems  Constitutional:  Negative for chills, fever and weight loss.  HENT:  Negative for tinnitus.   Respiratory:  Negative for cough, sputum production and shortness of breath.   Cardiovascular:  Negative for chest pain, palpitations and leg swelling.  Gastrointestinal:  Negative for abdominal pain, constipation, diarrhea and heartburn.  Genitourinary:  Negative for dysuria, frequency and urgency.  Musculoskeletal:  Positive for back pain. Negative for falls, joint pain and myalgias.  Skin: Negative.   Neurological:  Positive for tingling and sensory change. Negative for dizziness and headaches.  Psychiatric/Behavioral:  Negative for depression and memory loss. The patient does not have insomnia.  Past Medical History:  Diagnosis Date   Actinic keratosis 02/10/2021   left ear mid antihelix   Alcoholism (HCC)    per pt   Allergy    Basal cell carcinoma 05/13/2020   left prox medial pretibial    Basal cell carcinoma 08/26/2020   below the left medial knee - tx with ED&C    Basal cell carcinoma 02/10/2021   Right lower eyelid/lat canthus Moh's 08/07/2021   Basal cell carcinoma  12/17/2021   left epigastric, superficial- EDC   Basal cell carcinoma 06/03/2022   left lateral elbow, EDC 06/24/2022   Basal cell carcinoma 06/02/2023   Left upper arm. Superficial and focal infiltration. Excised 06/23/23   Basal cell carcinoma 06/02/2023   Right upper arm. Nodular and infiltrative. Excision 06/30/23   Coronary artery disease    a. 06/2017 MV: EF 45%, large inf and apical defect w/o ischemia;  b. 06/2017 Cath: LM nl, LAD 30ost, LCX 50p, OM1/2 ok, RCA 100p w/ L->R collats. EF 35%.   Depression with anxiety    Gastric ulcer, acute with hemorrhage 2001   Dr. Rollene, [facility]   H/O congenital atrial septal defect (ASD) repair    asd repair   Heart murmur    ASD repair    Helicobacter pylori gastritis    Heme positive stool    History of atrial septal defect repair 10/08/2016   History of gout    History of MRSA infection    History of peptic ulcer disease    had bleeding ulcers requiring transfusion   History of PSVT (paroxysmal supraventricular tachycardia)    History of tobacco abuse 12/13/2017   Hyperlipidemia    Hypertension    Ischemic cardiomyopathy    a. 06/2017 LV gram: EF 35%.   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   eft chest parasternal sup, txt'd EDC   Squamous cell carcinoma of skin 01/29/2020   right clavicle   Squamous cell carcinoma of skin 08/13/2021   R lat pectoral - EDC 09/24/2021   Squamous cell carcinoma of skin 11/20/2021   L chest parasternal sup - tx with ED&C   Squamous cell carcinoma of skin 11/20/2021   L chest parasternal inf - tx with ED&C 12/17/21   Squamous cell carcinoma of skin 11/20/2021   R Xyphoid - tx with [facility]  12/17/21   Tobacco abuse    Past Surgical History:  Procedure Laterality Date   ASD REPAIR, OSTIUM PRIMUM  1966   basal cell carcinoma removal     COLONOSCOPY     EAR BIOPSY Left 2013   Dr.Cook--squamous cell ca ([facility])   LEFT HEART CATH AND CORONARY ANGIOGRAPHY Left 06/30/2017   Procedure: LEFT HEART CATH AND CORONARY ANGIOGRAPHY;  Surgeon: Perla Evalene PARAS, MD;  Location: [facility] INVASIVE CV LAB;  Service: Cardiovascular;  Laterality: Left;   MOHS SURGERY     ear   open heart surgery  1966   Dr. Neysa ([facility]); was PFO repair--has been normal in f/u   PALATE / UVULA BIOPSY / EXCISION  2000   Dr.Sprenhe [facility])   TONSILLECTOMY AND ADENOIDECTOMY  1959   TOOTH  EXTRACTION  01/06/2023   Dentist   UVULECTOMY     Social History:   reports that he quit smoking about 9 years ago. His smoking use included cigarettes. He started smoking about 49 years ago. He has never used smokeless tobacco. He reports that he does not drink alcohol and does not use drugs.  Family History  Problem Relation Age of Onset   Failure to thrive Mother    Arthritis Mother    Other Mother        Colectomy   Brain cancer Father    Hypertension Sister    Brain cancer Sister    Other Sister        Blastoma   Stomach cancer Maternal Grandmother    Cancer Paternal Grandfather        type unknown   Liver cancer Paternal Uncle    Other Other        mother's siblings died of cancer ( abdominal)   Colon cancer Neg Hx    Colon polyps Neg Hx    Esophageal cancer Neg Hx    Rectal cancer Neg Hx     Medications: Patient's Medications  New Prescriptions   No medications on file  Previous Medications   ASPIRIN  EC 81 MG TABLET    Take 81 mg by mouth daily.   BUPROPION  (WELLBUTRIN  XL) 300 MG 24 HR TABLET    TAKE ONE TABLET BY MOUTH ONE TIME DAILY   EZETIMIBE  (ZETIA ) 10 MG TABLET    TAKE ONE TABLET BY MOUTH ONE TIME DAILY   FLUZONE HIGH-DOSE 0.5 ML INJECTION    Inject 0.5 mLs into the muscle once.    HYDROCHLOROTHIAZIDE  (HYDRODIURIL ) 25 MG TABLET    TAKE 1 TABLET (25 MG TOTAL) BY MOUTH DAILY AS NEEDED.   ISOSORBIDE  DINITRATE (ISORDIL ) 30 MG TABLET    Take 15 mg by mouth daily.   ISOSORBIDE  MONONITRATE (IMDUR ) 30 MG 24 HR TABLET    TAKE 1/2 OF A TABLET (15 MG TOTAL) BY MOUTH DAILY   LOSARTAN  (COZAAR ) 25 MG TABLET    TAKE ONE TABLET BY MOUTH ONE TIME DAILY   METOPROLOL  SUCCINATE (TOPROL -XL) 100 MG 24 HR TABLET    TAKE ONE TABLET BY MOUTH ONE TIME DAILY WITH OR IMMEDIATELY FOLLOWING A MEAL   NITROGLYCERIN  (NITROSTAT ) 0.4 MG SL TABLET    Place 1 tablet (0.4 mg total) under the tongue every 5 (five) minutes as needed for chest pain.   PROPRANOLOL  (INDERAL ) 20 MG TABLET    TAKE 1 TABLET (20 MG TOTAL) BY MOUTH AS NEEDED (AS NEEDED FOR FAST HEART RATES).   SENNA (SENOKOT) 8.6 MG TABLET    Take 2 tablets by mouth at bedtime.   SERTRALINE  (ZOLOFT ) 50 MG TABLET    Take 1 tablet (50 mg total) by mouth daily.   SIMVASTATIN  (ZOCOR ) 40 MG TABLET    TAKE ONE TABLET BY MOUTH ONE TIME DAILY   SPIRONOLACTONE  (ALDACTONE ) 25 MG TABLET    Take 1 tablet (25 mg total) by mouth daily.   TRAZODONE  (DESYREL ) 50 MG TABLET    Take 1 tablet (50 mg total) by mouth at bedtime as needed. for insomnia  Modified Medications   No medications on file  Discontinued Medications   No medications on file    Physical Exam:  Vitals:   08/07/24 0932  BP: 112/72  Pulse: 66  Temp: 97.6 F (36.4 C)  SpO2: 98%  Weight: 212 lb 9.6 oz (96.4 kg)  Height: 5' 11 (1.803  m)   Body mass index is 29.65 kg/m. Wt Readings from Last 3 Encounters:  08/07/24 212 lb 9.6 oz (96.4 kg)  02/03/24 210 lb (95.3 kg)  01/31/24 205 lb 6.4 oz (93.2 kg)    Physical Exam Constitutional:      General: He is not in acute distress.    Appearance: He is well-developed. He is not diaphoretic.  HENT:     Head: Normocephalic and atraumatic.     Right Ear: External ear normal.     Left Ear: External ear normal.     Mouth/Throat:     Pharynx: No  oropharyngeal exudate.  Eyes:     Conjunctiva/sclera: Conjunctivae normal.     Pupils: Pupils are equal, round, and reactive to light.  Cardiovascular:     Rate and Rhythm: Normal rate and regular rhythm.     Heart sounds: Normal heart sounds.  Pulmonary:     Effort: Pulmonary effort is normal.     Breath sounds: Normal breath sounds.  Abdominal:     General: Bowel sounds are normal.     Palpations: Abdomen is soft.  Musculoskeletal:        General: No tenderness.     Cervical back: Normal range of motion and neck supple.     Right lower leg: No edema.     Left lower leg: No edema.  Skin:    General: Skin is warm and dry.  Neurological:     Mental Status: He is alert and oriented to person, place, and time.     Labs reviewed: Basic Metabolic Panel: Recent Labs    01/31/24 1326  NA 134*  K 4.8  CL 100  CO2 28  GLUCOSE 95  BUN 17  CREATININE 1.34*  CALCIUM 9.8   Liver Function Tests: Recent Labs    01/31/24 1326  AST 17  ALT 15  BILITOT 0.6  PROT 6.6   No results for input(s): LIPASE, AMYLASE in the last 8760 hours. No results for input(s): AMMONIA in the last 8760 hours. CBC: Recent Labs    01/31/24 1326  WBC 8.8  NEUTROABS 6,037  HGB 13.9  HCT 42.6  MCV 92.2  PLT 332   Lipid Panel: Recent Labs    01/31/24 1326  CHOL 124  HDL 49  LDLCALC 52  TRIG 145  CHOLHDL 2.5   TSH: No results for input(s): TSH in the last 8760 hours. A1C: Lab Results  Component Value Date   HGBA1C 6.2 (H) 01/31/2024     Assessment/Plan  Assessment and Plan Assessment & Plan Low back pain with left-sided sciatica Chronic low back pain with left-sided sciatica, likely related to previous L1 compression fracture and possible L3-4, L4-5 involvement. Previous physical therapy was not beneficial. Possible bulging disc at L4-5 noted on prior imaging per patient. Neurosurgical evaluation suggested no surgical intervention at the time. - Order physical therapy  for back strengthening. - Refer to neurosurgery for evaluation of possible steroid injection or other interventions. - Prescribe gabapentin 100 mg, three times daily as needed for nerve pain. - Advise monitoring for drowsiness with gabapentin use.  Hyperglycemia Hyperglycemia with pending A1c to monitor glucose control. Discussed impact of sedentary lifestyle on blood sugar levels. - Order A1c test. - Discuss dietary modifications if A1c is elevated.  Essential hypertension Essential hypertension, currently managed with medication.  Mixed hyperlipidemia Mixed hyperlipidemia, managed with Zetia  and Zocor .  Depression Depression managed with Wellbutrin  and Zoloft . Mood reported as well-managed.  Gout Stable, no  recent flare  General Health Maintenance Discussed importance of maintaining mobility to prevent decline in health. - Order routine blood work to check kidney function and electrolytes.    Return in about 6 months (around 02/04/2025) for routine follow up.  Theodore Rahrig K. Caro BODILY [facility] [phone]

## 2024-08-08 LAB — HEMOGLOBIN A1C
Hgb A1c MFr Bld: 5.8 % — ABNORMAL HIGH (ref <5.7)
Mean Plasma Glucose: 120 mg/dL
eAG (mmol/L): 6.6 mmol/L

## 2024-08-08 LAB — CBC WITH DIFFERENTIAL/PLATELET
Absolute Lymphocytes: 1355 {cells}/uL (ref 850–3900)
Absolute Monocytes: 792 {cells}/uL (ref 200–950)
Basophils Absolute: 97 {cells}/uL (ref 0–200)
Basophils Relative: 1.1 %
Eosinophils Absolute: 396 {cells}/uL (ref 15–500)
Eosinophils Relative: 4.5 %
HCT: 41.3 % (ref 38.5–50.0)
Hemoglobin: 13.5 g/dL (ref 13.2–17.1)
MCH: 30.7 pg (ref 27.0–33.0)
MCHC: 32.7 g/dL (ref 32.0–36.0)
MCV: 93.9 fL (ref 80.0–100.0)
MPV: 9.6 fL (ref 7.5–12.5)
Monocytes Relative: 9 %
Neutro Abs: 6160 {cells}/uL (ref 1500–7800)
Neutrophils Relative %: 70 %
Platelets: 261 Thousand/uL (ref 140–400)
RBC: 4.4 Million/uL (ref 4.20–5.80)
RDW: 13.3 % (ref 11.0–15.0)
Total Lymphocyte: 15.4 %
WBC: 8.8 Thousand/uL (ref 3.8–10.8)

## 2024-08-08 LAB — COMPREHENSIVE METABOLIC PANEL WITH GFR
AG Ratio: 1.8 (calc) (ref 1.0–2.5)
ALT: 22 U/L (ref 9–46)
AST: 24 U/L (ref 10–35)
Albumin: 4 g/dL (ref 3.6–5.1)
Alkaline phosphatase (APISO): 42 U/L (ref 35–144)
BUN/Creatinine Ratio: 15 (calc) (ref 6–22)
BUN: 22 mg/dL (ref 7–25)
CO2: 27 mmol/L (ref 20–32)
Calcium: 9.4 mg/dL (ref 8.6–10.3)
Chloride: 101 mmol/L (ref 98–110)
Creat: 1.49 mg/dL — ABNORMAL HIGH (ref 0.70–1.28)
Globulin: 2.2 g/dL (ref 1.9–3.7)
Glucose, Bld: 104 mg/dL — ABNORMAL HIGH (ref 65–99)
Potassium: 4.5 mmol/L (ref 3.5–5.3)
Sodium: 136 mmol/L (ref 135–146)
Total Bilirubin: 0.5 mg/dL (ref 0.2–1.2)
Total Protein: 6.2 g/dL (ref 6.1–8.1)
eGFR: 47 mL/min/1.73m2 — ABNORMAL LOW (ref >=60)

## 2024-08-09 ENCOUNTER — Ambulatory Visit: Payer: Self-pay | Admitting: Nurse Practitioner

## 2024-08-09 DIAGNOSIS — Z85828 Personal history of other malignant neoplasm of skin: Secondary | ICD-10-CM | POA: Diagnosis not present

## 2024-08-09 DIAGNOSIS — D2262 Melanocytic nevi of left upper limb, including shoulder: Secondary | ICD-10-CM | POA: Diagnosis not present

## 2024-08-09 DIAGNOSIS — D485 Neoplasm of uncertain behavior of skin: Secondary | ICD-10-CM | POA: Diagnosis not present

## 2024-08-09 DIAGNOSIS — L905 Scar conditions and fibrosis of skin: Secondary | ICD-10-CM | POA: Diagnosis not present

## 2024-08-09 DIAGNOSIS — C44519 Basal cell carcinoma of skin of other part of trunk: Secondary | ICD-10-CM | POA: Diagnosis not present

## 2024-08-09 DIAGNOSIS — D2271 Melanocytic nevi of right lower limb, including hip: Secondary | ICD-10-CM | POA: Diagnosis not present

## 2024-08-09 DIAGNOSIS — Z08 Encounter for follow-up examination after completed treatment for malignant neoplasm: Secondary | ICD-10-CM | POA: Diagnosis not present

## 2024-08-09 DIAGNOSIS — D2272 Melanocytic nevi of left lower limb, including hip: Secondary | ICD-10-CM | POA: Diagnosis not present

## 2024-08-09 DIAGNOSIS — C44311 Basal cell carcinoma of skin of nose: Secondary | ICD-10-CM | POA: Diagnosis not present

## 2024-08-09 DIAGNOSIS — D0462 Carcinoma in situ of skin of left upper limb, including shoulder: Secondary | ICD-10-CM | POA: Diagnosis not present

## 2024-08-09 DIAGNOSIS — L910 Hypertrophic scar: Secondary | ICD-10-CM | POA: Diagnosis not present

## 2024-08-09 DIAGNOSIS — D0471 Carcinoma in situ of skin of right lower limb, including hip: Secondary | ICD-10-CM | POA: Diagnosis not present

## 2024-08-09 DIAGNOSIS — D045 Carcinoma in situ of skin of trunk: Secondary | ICD-10-CM | POA: Diagnosis not present

## 2024-08-09 DIAGNOSIS — L821 Other seborrheic keratosis: Secondary | ICD-10-CM | POA: Diagnosis not present

## 2024-08-09 DIAGNOSIS — D225 Melanocytic nevi of trunk: Secondary | ICD-10-CM | POA: Diagnosis not present

## 2024-08-09 DIAGNOSIS — C44529 Squamous cell carcinoma of skin of other part of trunk: Secondary | ICD-10-CM | POA: Diagnosis not present

## 2024-08-09 DIAGNOSIS — L570 Actinic keratosis: Secondary | ICD-10-CM | POA: Diagnosis not present

## 2024-08-09 DIAGNOSIS — D2261 Melanocytic nevi of right upper limb, including shoulder: Secondary | ICD-10-CM | POA: Diagnosis not present

## 2024-08-10 ENCOUNTER — Other Ambulatory Visit: Payer: Self-pay | Admitting: Nurse Practitioner

## 2024-08-11 NOTE — Telephone Encounter (Signed)
 High risk or very high risk warning populated when attempting to refill medication. RX request sent to PCP for review and approval if warranted.

## 2024-08-24 DIAGNOSIS — M6281 Muscle weakness (generalized): Secondary | ICD-10-CM | POA: Diagnosis not present

## 2024-08-24 DIAGNOSIS — M5442 Lumbago with sciatica, left side: Secondary | ICD-10-CM | POA: Diagnosis not present

## 2024-08-28 DIAGNOSIS — M6281 Muscle weakness (generalized): Secondary | ICD-10-CM | POA: Diagnosis not present

## 2024-08-28 DIAGNOSIS — M5442 Lumbago with sciatica, left side: Secondary | ICD-10-CM | POA: Diagnosis not present

## 2024-08-30 ENCOUNTER — Inpatient Hospital Stay
Admission: RE | Admit: 2024-08-30 | Discharge: 2024-08-30 | Disposition: A | Discharge status: Home / Self Care | Payer: Self-pay | Source: Ambulatory Visit | Attending: Neurosurgery | Admitting: Neurosurgery

## 2024-08-30 ENCOUNTER — Other Ambulatory Visit: Payer: Self-pay | Admitting: Family Medicine

## 2024-08-30 DIAGNOSIS — M5442 Lumbago with sciatica, left side: Secondary | ICD-10-CM | POA: Diagnosis not present

## 2024-08-30 DIAGNOSIS — Z049 Encounter for examination and observation for unspecified reason: Secondary | ICD-10-CM

## 2024-08-30 DIAGNOSIS — M6281 Muscle weakness (generalized): Secondary | ICD-10-CM | POA: Diagnosis not present

## 2024-09-04 ENCOUNTER — Other Ambulatory Visit: Payer: Self-pay | Admitting: Nurse Practitioner

## 2024-09-04 DIAGNOSIS — E782 Mixed hyperlipidemia: Secondary | ICD-10-CM

## 2024-09-07 ENCOUNTER — Other Ambulatory Visit: Payer: Self-pay | Admitting: Nurse Practitioner

## 2024-09-07 DIAGNOSIS — M5442 Lumbago with sciatica, left side: Secondary | ICD-10-CM | POA: Diagnosis not present

## 2024-09-07 DIAGNOSIS — M6281 Muscle weakness (generalized): Secondary | ICD-10-CM | POA: Diagnosis not present

## 2024-09-11 NOTE — Progress Notes (Unsigned)
 Referring Physician:  Caro Harlene POUR, NP [address] [place],  [place] [address]  Primary Physician:  Caro Harlene POUR, NP  History of Present Illness: 09/11/2024 Mr. Ishaq Maffei is here today with a chief complaint of ***  Left side low back pain that radiates down the left leg to the foot causing weakness and difficulty walking.   Duration: Post fall 04/2023 Compression fracture at L1   Bowel/Bladder Dysfunction: none  Conservative measures:  Physical therapy: *** Currently in PT at [facility] Multimodal medical therapy including regular antiinflammatories: ***Gabapentin    Injections: no epidural steroid injections  Past Surgery: ***none  Jayson DEL Hegeman Jr. has ***no symptoms of cervical myelopathy.  The symptoms are causing a significant impact on the patient's life.   I have utilized the care everywhere function in epic to review the outside records available from external health systems.  Review of Systems:  A 10 point review of systems is negative, except for the pertinent positives and negatives detailed in the HPI.  Past Medical History: Past Medical History:  Diagnosis Date   Actinic keratosis 02/10/2021   left ear mid antihelix   Alcoholism (HCC)    per pt   Allergy    Basal cell carcinoma 05/13/2020   left prox medial pretibial    Basal cell carcinoma 08/26/2020   below the left medial knee - tx with ED&C    Basal cell carcinoma 02/10/2021   Right lower eyelid/lat canthus Moh's 08/07/2021   Basal cell carcinoma 12/17/2021   left epigastric, superficial- EDC   Basal cell carcinoma 06/03/2022   left lateral elbow, EDC 06/24/2022   Basal cell carcinoma 06/02/2023   Left upper arm. Superficial and focal infiltration. Excised 06/23/23   Basal cell carcinoma 06/02/2023   Right upper arm. Nodular and infiltrative. Excision 06/30/23   Coronary artery disease    a. 06/2017 MV: EF 45%, large inf and apical defect w/o ischemia;  b. 06/2017  Cath: LM nl, LAD 30ost, LCX 50p, OM1/2 ok, RCA 100p w/ L->R collats. EF 35%.   Depression with anxiety    Gastric ulcer, acute with hemorrhage 2001   Dr. Rollene, [facility]   H/O congenital atrial septal defect (ASD) repair    asd repair   Heart murmur    ASD repair    Helicobacter pylori gastritis    Heme positive stool    History of atrial septal defect repair 10/08/2016   History of gout    History of MRSA infection    History of peptic ulcer disease    had bleeding ulcers requiring transfusion   History of PSVT (paroxysmal supraventricular tachycardia)    History of tobacco abuse 12/13/2017   Hyperlipidemia    Hypertension    Ischemic cardiomyopathy    a. 06/2017 LV gram: EF 35%.   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   eft chest parasternal sup, txt'd EDC   Squamous cell carcinoma of skin 01/29/2020   right clavicle   Squamous cell carcinoma of skin 08/13/2021   R lat pectoral - EDC 09/24/2021   Squamous cell carcinoma of skin 11/20/2021   L  chest parasternal sup - tx with ED&C   Squamous cell carcinoma of skin 11/20/2021   L chest parasternal inf - tx with ED&C 12/17/21   Squamous cell carcinoma of skin 11/20/2021   R Xyphoid - tx with [facility] 12/17/21   Tobacco abuse     Past Surgical History: Past Surgical History:  Procedure Laterality Date   ASD REPAIR, OSTIUM PRIMUM  1966   basal cell carcinoma removal     COLONOSCOPY     EAR BIOPSY Left 2013   Dr.Cook--squamous cell ca ([facility])   LEFT HEART CATH AND CORONARY ANGIOGRAPHY Left 06/30/2017   Procedure: LEFT HEART CATH AND CORONARY ANGIOGRAPHY;  Surgeon: Perla Evalene PARAS, MD;  Location: [facility] INVASIVE CV LAB;   Service: Cardiovascular;  Laterality: Left;   MOHS SURGERY     ear   open heart surgery  1966   Dr. Neysa ([facility]); was PFO repair--has been normal in f/u   PALATE / UVULA BIOPSY / EXCISION  2000   Dr.Sprenhe [facility])   TONSILLECTOMY AND ADENOIDECTOMY  1959   TOOTH EXTRACTION  01/06/2023   Dentist   UVULECTOMY      Allergies: Allergies as of 09/18/2024 - Review Complete 08/07/2024  Allergen Reaction Noted   Pollen extract Other (See Comments)     Medications:  Current Outpatient Medications:    aspirin  EC 81 MG tablet, Take 81 mg by mouth daily., Disp: , Rfl:    buPROPion  (WELLBUTRIN  XL) 300 MG 24 hr tablet, TAKE ONE TABLET BY MOUTH ONE TIME DAILY, Disp: 90 tablet, Rfl: 1   ezetimibe  (ZETIA ) 10 MG tablet, TAKE ONE TABLET BY MOUTH ONE TIME DAILY, Disp: 90 tablet, Rfl: 2   gabapentin  (NEURONTIN ) 100 MG capsule, Take 1 capsule (100 mg total) by mouth 3 (three) times daily as needed., Disp: 90 capsule, Rfl: 1   hydrochlorothiazide  (HYDRODIURIL ) 25 MG tablet, TAKE 1 TABLET (25 MG TOTAL) BY MOUTH DAILY AS NEEDED., Disp: 90 tablet, Rfl: 3   isosorbide  mononitrate (IMDUR ) 30 MG 24 hr tablet, TAKE ONE-HALF TABLET BY MOUTH ONE TIME DAILY, Disp: 45 tablet, Rfl: 2   losartan  (COZAAR ) 25 MG tablet, TAKE ONE TABLET BY MOUTH ONE TIME DAILY, Disp: 90 tablet, Rfl: 1   metoprolol  succinate (TOPROL -XL) 100 MG 24 hr tablet, TAKE ONE TABLET BY MOUTH ONE TIME DAILY WITH OR IMMEDIATELY FOLLOWING A MEAL, Disp: 90 tablet, Rfl: 1   nitroGLYCERIN  (NITROSTAT ) 0.4 MG SL tablet, Place 1 tablet (0.4 mg total) under the tongue every 5 (five) minutes as needed for chest pain., Disp: 25 tablet, Rfl: 7   propranolol  (INDERAL ) 20 MG tablet, TAKE 1 TABLET (20 MG TOTAL) BY MOUTH AS NEEDED (AS NEEDED FOR FAST HEART RATES)., Disp: 90 tablet, Rfl: 1   senna (SENOKOT) 8.6 MG tablet, Take 2 tablets by mouth at bedtime., Disp: , Rfl:    sertraline  (ZOLOFT ) 50 MG tablet, Take 1 tablet (50 mg total) by mouth daily., Disp: 90 tablet,  Rfl: 3   simvastatin  (ZOCOR ) 40 MG tablet, TAKE ONE TABLET BY MOUTH ONE TIME DAILY, Disp: 90 tablet, Rfl: 1   spironolactone  (ALDACTONE ) 25 MG tablet, TAKE ONE TABLET BY MOUTH ONE TIME DAILY, Disp: 90 tablet, Rfl: 1   traZODone  (DESYREL ) 50 MG tablet, TAKE ONE TABLET BY MOUTH AT BEDTIME AS NEEDED FOR INSOMNIA, Disp: 90 tablet, Rfl: 1  Social History: Social History   Tobacco Use   Smoking status: Former    Current packs/day: 0.00    Types: Cigarettes    Start  date: 09/03/1974    Quit date: 09/03/2014    Years since quitting: 10.0   Smokeless tobacco: Never  Vaping Use   Vaping status: Never Used  Substance Use Topics   Alcohol use: No    Alcohol/week: 0.0 standard drinks of alcohol    Comment: previously did drink alcohol heavily   Drug use: No    Comment: likes to avoid pain meds, no illicit drugs used    Family Medical History: Family History  Problem Relation Age of Onset   Failure to thrive Mother    Arthritis Mother    Other Mother        Colectomy   Brain cancer Father    Hypertension Sister    Brain cancer Sister    Other Sister        Blastoma   Stomach cancer Maternal Grandmother    Cancer Paternal Grandfather        type unknown   Liver cancer Paternal Uncle    Other Other        mother's siblings died of cancer ( abdominal)   Colon cancer Neg Hx    Colon polyps Neg Hx    Esophageal cancer Neg Hx    Rectal cancer Neg Hx     Physical Examination: There were no vitals filed for this visit.  General: Patient is in no apparent distress. Attention to examination is appropriate.  Neck:   Supple.  Full range of motion.  Respiratory: Patient is breathing without any difficulty.   NEUROLOGICAL:     Awake, alert, oriented to person, place, and time.  Speech is clear and fluent.   Cranial Nerves: Pupils equal round and reactive to light.  Facial tone is symmetric.  Facial sensation is symmetric. Shoulder shrug is symmetric. Tongue protrusion is midline.     Strength: Side Biceps Triceps Deltoid Interossei Grip Wrist Ext. Wrist Flex.  R 5 5 5 5 5 5 5   L 5 5 5 5 5 5 5    Side Iliopsoas Quads Hamstring PF DF EHL  R 5 5 5 5 5 5   L 5 5 5 5 5 5    Reflexes are ***2+ and symmetric at the biceps, triceps, brachioradialis, patella and achilles.   Hoffman's is absent. Clonus is absent  Bilateral upper and lower extremity sensation is intact to light touch ***.     No evidence of dysmetria noted.  Gait is normal.    Imaging: *** I have personally reviewed the images and agree with the above interpretation.  Medical Decision Making/Assessment and Plan: Mr. Dauber is a pleasant 78 y.o. male with ***  There are no diagnoses linked to this encounter.   Thank you for involving me in the care of this patient.    Penne MICAEL Sharps MD/MSCR Neurosurgery

## 2024-09-12 DIAGNOSIS — M6281 Muscle weakness (generalized): Secondary | ICD-10-CM | POA: Diagnosis not present

## 2024-09-12 DIAGNOSIS — M5442 Lumbago with sciatica, left side: Secondary | ICD-10-CM | POA: Diagnosis not present

## 2024-09-18 ENCOUNTER — Encounter: Payer: Self-pay | Admitting: Neurosurgery

## 2024-09-18 ENCOUNTER — Ambulatory Visit

## 2024-09-18 ENCOUNTER — Ambulatory Visit: Admitting: Neurosurgery

## 2024-09-18 VITALS — BP 110/68 | Wt 208.8 lb

## 2024-09-18 DIAGNOSIS — S32010S Wedge compression fracture of first lumbar vertebra, sequela: Secondary | ICD-10-CM

## 2024-09-18 DIAGNOSIS — M25552 Pain in left hip: Secondary | ICD-10-CM | POA: Diagnosis not present

## 2024-09-18 DIAGNOSIS — M5126 Other intervertebral disc displacement, lumbar region: Secondary | ICD-10-CM | POA: Diagnosis not present

## 2024-09-18 DIAGNOSIS — M545 Low back pain, unspecified: Secondary | ICD-10-CM

## 2024-09-18 DIAGNOSIS — G8929 Other chronic pain: Secondary | ICD-10-CM

## 2024-09-18 DIAGNOSIS — M160 Bilateral primary osteoarthritis of hip: Secondary | ICD-10-CM | POA: Diagnosis not present

## 2024-09-18 DIAGNOSIS — M47816 Spondylosis without myelopathy or radiculopathy, lumbar region: Secondary | ICD-10-CM | POA: Diagnosis not present

## 2024-09-18 DIAGNOSIS — M79605 Pain in left leg: Secondary | ICD-10-CM | POA: Insufficient documentation

## 2024-09-18 DIAGNOSIS — M4856XA Collapsed vertebra, not elsewhere classified, lumbar region, initial encounter for fracture: Secondary | ICD-10-CM | POA: Diagnosis not present

## 2024-09-18 DIAGNOSIS — M7062 Trochanteric bursitis, left hip: Secondary | ICD-10-CM | POA: Diagnosis not present

## 2024-09-18 DIAGNOSIS — M79652 Pain in left thigh: Secondary | ICD-10-CM

## 2024-09-18 DIAGNOSIS — M47817 Spondylosis without myelopathy or radiculopathy, lumbosacral region: Secondary | ICD-10-CM | POA: Diagnosis not present

## 2024-10-03 ENCOUNTER — Ambulatory Visit: Payer: Self-pay | Admitting: Neurosurgery

## 2024-10-08 ENCOUNTER — Other Ambulatory Visit: Payer: Self-pay | Admitting: Nurse Practitioner

## 2024-10-08 DIAGNOSIS — F339 Major depressive disorder, recurrent, unspecified: Secondary | ICD-10-CM

## 2024-10-10 ENCOUNTER — Encounter: Payer: Self-pay | Admitting: Nurse Practitioner

## 2024-10-10 ENCOUNTER — Encounter: Payer: Medicare HMO | Admitting: Nurse Practitioner

## 2024-10-10 ENCOUNTER — Encounter: Payer: Self-pay | Admitting: Neurosurgery

## 2024-10-10 DIAGNOSIS — Z0000 Encounter for general adult medical examination without abnormal findings: Secondary | ICD-10-CM | POA: Diagnosis not present

## 2024-10-10 NOTE — Progress Notes (Signed)
"  °  This service is provided via telemedicine  No vital signs collected/recorded due to the encounter was a telemedicine visit.   Location of patient (ex: home, work):  Home  Patient consents to a telephone visit:  Yes  Location of the provider (ex: office, home):  Office [place].   Name of any referring provider:  na  Names of all persons participating in the telemedicine service and their role in the encounter:  Mitchell Stephens, patient, Donzell Beal, CMA, Harlene An, NP  Time spent on call:  7:11  "

## 2024-10-10 NOTE — Patient Instructions (Signed)
 [name],  Thank you for taking the time for your Medicare Wellness Visit. I appreciate your continued commitment to your health goals. Please review the care plan we discussed, and feel free to reach out if I can assist you further.  Please note that Annual Wellness Visits do not include a physical exam. Some assessments may be limited, especially if the visit was conducted virtually. If needed, we may recommend an in-person follow-up with your provider.  Ongoing Care Seeing your primary care provider every 3 to 6 months helps us  monitor your health and provide consistent, personalized care.   Referrals If a referral was made during today's visit and you haven't received any updates within two weeks, please contact the referred provider directly to check on the status.  Recommended Screenings:  Health Maintenance  Topic Date Due   COVID-19 Vaccine (11 - 2025-26 season) 06/30/2024   Medicare Annual Wellness Visit  10/04/2024   DTaP/Tdap/Td vaccine (2 - Td or Tdap) 12/14/2033   Pneumococcal Vaccine for age over 36  Completed   Flu Shot  Completed   Hepatitis C Screening  Completed   Zoster (Shingles) Vaccine  Completed   Meningitis B Vaccine  Aged Out   Colon Cancer Screening  Discontinued       10/10/2024   11:03 AM  Advanced Directives  Does Patient Have a Medical Advance Directive? Yes  Type of Advance Directive Healthcare Power of Attorney  Does patient want to make changes to medical advance directive? No - Patient declined  Copy of Healthcare Power of Attorney in Chart? No - copy requested    Vision: Annual vision screenings are recommended for early detection of glaucoma, cataracts, and diabetic retinopathy. These exams can also reveal signs of chronic conditions such as diabetes and high blood pressure.  Dental: Annual dental screenings help detect early signs of oral cancer, gum disease, and other conditions linked to overall health, including heart disease and  diabetes.  Please see the attached documents for additional preventive care recommendations.

## 2024-10-10 NOTE — Progress Notes (Signed)
 "  Chief Complaint  Patient presents with   Medicare Wellness    AWV     Subjective:   Mitchell Stephens. is a 79 y.o. male who presents for a Medicare Annual Wellness Visit.  Visit info / Clinical Intake: Medicare Wellness Visit Type:: Subsequent Annual Wellness Visit Persons participating in visit and providing information:: patient Medicare Wellness Visit Mode:: Video Since this visit was completed virtually, some vitals may be partially provided or unavailable. Missing vitals are due to the limitations of the virtual format.: Unable to obtain vitals - no equipment If Telephone or Video please confirm:: I connected with patient using audio/video enable telemedicine. I verified patient identity with two identifiers, discussed telehealth limitations, and patient agreed to proceed. Patient Location:: Home Provider Location:: Office [place]. Interpreter Needed?: No Pre-visit prep was completed: yes AWV questionnaire completed by patient prior to visit?: yes Date:: 10/09/24 Living arrangements:: lives with spouse/significant other Patient's Overall Health Status Rating: good Typical amount of pain: some Does pain affect daily life?: (!) yes Are you currently prescribed opioids?: no  Dietary Habits and Nutritional Risks How many meals a day?: 3 Eats fruit and vegetables daily?: yes Most meals are obtained by: preparing own meals In the last 2 weeks, have you had any of the following?: none Diabetic:: no  Functional Status Activities of Daily Living (to include ambulation/medication): Independent Ambulation: Independent Medication Administration: Independent Home Management (perform basic housework or laundry): Independent Manage your own finances?: yes Primary transportation is: driving Concerns about vision?: no *vision screening is required for WTM* Concerns about hearing?: no  Fall Screening Falls in the past year?: 0 Number of falls in past year: 0 Was there  an injury with Fall?: 0 Fall Risk Category Calculator: 0 Patient Fall Risk Level: Low Fall Risk  Fall Risk Patient at Risk for Falls Due to: No Fall Risks Fall risk Follow up: Falls evaluation completed  Home and Transportation Safety: All rugs have non-skid backing?: yes All stairs or steps have railings?: yes Grab bars in the bathtub or shower?: yes Have non-skid surface in bathtub or shower?: yes Good home lighting?: yes Regular seat belt use?: yes Hospital stays in the last year:: no  Cognitive Assessment Difficulty concentrating, remembering, or making decisions? : no Will 6CIT or Mini Cog be Completed: yes What year is it?: 0 points What month is it?: 0 points Give patient an address phrase to remember (5 components): [address] [facility] [address] [place]  About what time is it?: 0 points Count backwards from 20 to 1: 0 points Say the months of the year in reverse: 0 points Repeat the address phrase from earlier: 0 points 6 CIT Score: 0 points  Advance Directives (For Healthcare) Does Patient Have a Medical Advance Directive?: Yes Does patient want to make changes to medical advance directive?: No - Patient declined Type of Advance Directive: Healthcare Power of Attorney Copy of Healthcare Power of Attorney in Chart?: No - copy requested Copy of Living Will in Chart?: No - copy requested  Reviewed/Updated  Reviewed/Updated: Reviewed All (Medical, Surgical, Family, Medications, Allergies, Care Teams, Patient Goals)    Allergies (verified) Pollen extract   Current Medications (verified) Outpatient Encounter Medications as of 10/10/2024  Medication Sig   aspirin  EC 81 MG tablet Take 81 mg by mouth daily.   buPROPion  (WELLBUTRIN  XL) 300 MG 24 hr tablet TAKE ONE TABLET BY MOUTH ONE TIME DAILY   ezetimibe  (ZETIA ) 10 MG tablet TAKE ONE TABLET BY MOUTH ONE TIME  DAILY   gabapentin  (NEURONTIN ) 100 MG capsule Take 1 capsule (100 mg total) by mouth 3 (three) times daily as needed.    hydrochlorothiazide  (HYDRODIURIL ) 25 MG tablet TAKE ONE TABLET BY MOUTH ONE TIME DAILY AS NEEDED   isosorbide  mononitrate (IMDUR ) 30 MG 24 hr tablet TAKE ONE-HALF TABLET BY MOUTH ONE TIME DAILY   losartan  (COZAAR ) 25 MG tablet TAKE ONE TABLET BY MOUTH ONE TIME DAILY   metoprolol  succinate (TOPROL -XL) 100 MG 24 hr tablet TAKE ONE TABLET BY MOUTH ONE TIME DAILY WITH OR IMMEDIATELY FOLLOWING A MEAL   nitroGLYCERIN  (NITROSTAT ) 0.4 MG SL tablet Place 1 tablet (0.4 mg total) under the tongue every 5 (five) minutes as needed for chest pain.   senna (SENOKOT) 8.6 MG tablet Take 2 tablets by mouth at bedtime.   sertraline  (ZOLOFT ) 50 MG tablet TAKE ONE TABLET (50 MG TOTAL) BY MOUTH ONE TIME DAILY   simvastatin  (ZOCOR ) 40 MG tablet TAKE ONE TABLET BY MOUTH ONE TIME DAILY   spironolactone  (ALDACTONE ) 25 MG tablet TAKE ONE TABLET BY MOUTH ONE TIME DAILY   traZODone  (DESYREL ) 50 MG tablet TAKE ONE TABLET BY MOUTH AT BEDTIME AS NEEDED FOR INSOMNIA   No facility-administered encounter medications on file as of 10/10/2024.    History: Past Medical History:  Diagnosis Date   Actinic keratosis 02/10/2021   left ear mid antihelix   Alcoholism (HCC)    per pt   Allergy    Basal cell carcinoma 05/13/2020   left prox medial pretibial    Basal cell carcinoma 08/26/2020   below the left medial knee - tx with ED&C    Basal cell carcinoma 02/10/2021   Right lower eyelid/lat canthus Moh's 08/07/2021   Basal cell carcinoma 12/17/2021   left epigastric, superficial- EDC   Basal cell carcinoma 06/03/2022   left lateral elbow, EDC 06/24/2022   Basal cell carcinoma 06/02/2023   Left upper arm. Superficial and focal infiltration. Excised 06/23/23   Basal cell carcinoma 06/02/2023   Right upper arm. Nodular and infiltrative. Excision 06/30/23   Coronary artery disease    a. 06/2017 MV: EF 45%, large inf and apical defect w/o ischemia;  b. 06/2017 Cath: LM nl, LAD 30ost, LCX 50p, OM1/2 ok, RCA 100p w/ L->R collats. EF  35%.   Depression with anxiety    Gastric ulcer, acute with hemorrhage 2001   Dr. Rollene, [facility]   H/O congenital atrial septal defect (ASD) repair    asd repair   Heart murmur    ASD repair    Helicobacter pylori gastritis    Heme positive stool    History of atrial septal defect repair 10/08/2016   History of gout    History of MRSA infection    History of peptic ulcer disease    had bleeding ulcers requiring transfusion   History of PSVT (paroxysmal supraventricular tachycardia)    History of tobacco abuse 12/13/2017   Hyperlipidemia    Hypertension    Ischemic cardiomyopathy    a. 06/2017 LV gram: EF 35%.   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   eft chest parasternal sup, txt'd [facility]  Squamous cell carcinoma of skin 01/29/2020   right clavicle   Squamous cell carcinoma of skin 08/13/2021   R lat pectoral - EDC 09/24/2021   Squamous cell carcinoma of skin 11/20/2021   L chest parasternal sup - tx with ED&C   Squamous cell carcinoma of skin 11/20/2021   L chest parasternal inf - tx with ED&C 12/17/21   Squamous cell carcinoma of skin 11/20/2021   R Xyphoid - tx with [facility] 12/17/21   Tobacco abuse    Past Surgical History:  Procedure Laterality Date   ASD REPAIR, OSTIUM PRIMUM  1966   basal cell carcinoma removal     COLONOSCOPY     EAR BIOPSY Left 2013   Dr.Cook--squamous cell ca ([facility])   LEFT HEART CATH AND CORONARY ANGIOGRAPHY Left 06/30/2017   Procedure: LEFT HEART CATH AND CORONARY ANGIOGRAPHY;  Surgeon: Perla Evalene PARAS, MD;  Location: [facility] INVASIVE CV LAB;  Service: Cardiovascular;  Laterality: Left;   MOHS SURGERY     ear   open heart surgery  1966    Dr. Neysa ([facility]); was PFO repair--has been normal in f/u   PALATE / UVULA BIOPSY / EXCISION  2000   Dr.Sprenhe [facility])   TONSILLECTOMY AND ADENOIDECTOMY  1959   TOOTH EXTRACTION  01/06/2023   Dentist   UVULECTOMY     Family History  Problem Relation Age of Onset   Failure to thrive Mother    Arthritis Mother    Other Mother        Colectomy   Brain cancer Father    Hypertension Sister    Brain cancer Sister    Other Sister        Blastoma   Stomach cancer Maternal Grandmother    Cancer Paternal Grandfather        type unknown   Liver cancer Paternal Uncle    Other Other        mother's siblings died of cancer ( abdominal)   Colon cancer Neg Hx    Colon polyps Neg Hx    Esophageal cancer Neg Hx    Rectal cancer Neg Hx    Social History   Occupational History   Occupation: retired  Tobacco Use   Smoking status: Former    Current packs/day: 0.00    Types: Cigarettes    Start date: 09/03/1974    Quit date: 09/03/2014    Years since quitting: 10.1   Smokeless tobacco: Never  Vaping Use   Vaping status: Never Used  Substance and Sexual Activity   Alcohol use: No    Alcohol/week: 0.0 standard drinks of alcohol    Comment: previously did drink alcohol heavily   Drug use: No    Comment: likes to avoid pain meds, no illicit drugs used   Sexual activity: Yes   Tobacco Counseling Counseling given: Not Answered  SDOH Screenings   Food Insecurity: No Food Insecurity (10/10/2024)  Housing: Unknown (10/10/2024)  Transportation Needs: No Transportation Needs (10/10/2024)  Utilities: Not At Risk (10/10/2024)  Depression (PHQ2-9): Low Risk (10/10/2024)  Financial Resource Strain: Low Risk (08/06/2024)  Physical Activity: Inactive (08/06/2024)  Social Connections: Unknown (08/06/2024)  Stress: No Stress Concern Present (08/06/2024)  Tobacco Use: Medium Risk (10/10/2024)   See flowsheets for full screening details  Depression Screen PHQ 2 & 9 Depression Scale- Over the past 2  weeks, how often have you been bothered by any of the following problems? Little interest or pleasure in doing things: 0 Feeling down, depressed, or hopeless (  PHQ Adolescent also includes...irritable): 0 PHQ-2 Total Score: 0     Goals Addressed   None          Objective:    There were no vitals filed for this visit. There is no height or weight on file to calculate BMI.  Hearing/Vision screen Vision Screening - Comments:: [facility] Last Exam: 08/2024 Immunizations and Health Maintenance Health Maintenance  Topic Date Due   COVID-19 Vaccine (11 - 2025-26 season) 06/30/2024   Medicare Annual Wellness (AWV)  10/10/2025   DTaP/Tdap/Td (2 - Td or Tdap) 12/14/2033   Pneumococcal Vaccine: 50+ Years  Completed   Influenza Vaccine  Completed   Hepatitis C Screening  Completed   Zoster Vaccines- Shingrix  Completed   Meningococcal B Vaccine  Aged Out   Colonoscopy  Discontinued        Assessment/Plan:  This is a routine wellness examination for Grayson.  Patient Care Team: Caro Harlene POUR, NP as PCP - General (Geriatric Medicine) Jordan, Peter M, MD as PCP - Cardiology (Cardiology) Clayburn Heinrich, [place] as Referring Physician (Dermatology) Milissa Hamming, MD as Referring Physician (Otolaryngology) Myrna Adine Anes, MD as Consulting Physician (Ophthalmology)  I have personally reviewed and noted the following in the patients chart:   Medical and social history Use of alcohol, tobacco or illicit drugs  Current medications and supplements including opioid prescriptions. Functional ability and status Nutritional status Physical activity Advanced directives List of other physicians Hospitalizations, surgeries, and ER visits in previous 12 months Vitals Screenings to include cognitive, depression, and falls Referrals and appointments  No orders of the defined types were placed in this encounter.  In addition, I have reviewed and discussed with patient certain  preventive protocols, quality metrics, and best practice recommendations. A written personalized care plan for preventive services as well as general preventive health recommendations were provided to patient.   Harlene POUR Caro, NP   10/10/2024   Return in 1 year (on 10/10/2025) for AWV.  After Visit Summary: (MyChart) Due to this being a telephonic visit, the after visit summary with patients personalized plan was offered to patient via MyChart   "

## 2024-10-25 NOTE — Progress Notes (Signed)
 [facility] Crown Preparation Appointment  Mitchell Stephens is a 79 y.o. male identified by name who presents to the [facility] for preparation of Monolithic Zirconia crown on tooth #21. Patient denies any changes to medical history or medication. Patient also denies any significant post operative pain, swelling, bleeding, or infection.  Patient was seen this morning for cleaning which resulted in revision for bony fragnments in grad perio after extraction of #6 and 11 in urgent care.  Patient expressed that visit was the worse dental visit he had ever had.  Patient did not take blood pressure cockatil since 24 hrs.  Requested tylenol . Taken 500mg  before 11am. Prescribed 500mg  at 2:21pm for pain after flap surgery.  Dental procedures in this visit   There are no dental procedures in this visit.    Pre-Op Vitals: BP 120/79   Pulse 71   Impression taken for preimpression of maxillary RPD  Photo with Shade  Patient had projecting bone  Patient would like tx completed by march 21st Indiction for acrylic rpd  Extraoral & Intraoral Exams: Extraoral exam: Changes observed since last appointment: .Some blood from AM appt. Intraoral exam: Changes observed since last appointment: Flap opened on #11 for alveoloplasty per patient, Nabs crackers which patient ate for lunch.  Treatment of Tooth #21: Topical benzocaine 20% placed. Profound local anesthesia obtained with 1 cartridge of Septocaine 4% with epinephrine 1:100,000 via soft tissue infiltrations.  Crown Prep: Tooth #21 prepared for Monolithic Zirconia crown.  Provisionalization matrix made with pre-operative intraoral STD putty .  Isovac, size M achieved.  Adequate 1.11mm occlusal and 1mm axial reductions were made for Monolithic Zirconia crown with a 1mm chamfer margin established supragingival.  Adequate clearance, draw, and ferrule established with no  undercuts.  Impression Taken on office B Trios scanner in MIP with minimal discomfort. Bite in MIP also taken using Regisil.  Provisionalization: Provisional crown fabricated using A2 in the pre-operative matrix.  Provisional crown removed and excess trimmed away.  Margins determined to be acceptable.  Provisional crown cemented with Tempbond with patient applying firm biting pressure to cotton roll for 5 minutes.  Excess cement cleaned with spoon, knotted floss, and explorer.  Occlusion checked and adjusted.    Questions invited and answered. Patient tolerated the treatment well and left in stable condition satisfied with today's appointment.  Plan: [x]  #21 Buildup [x]  #21 Prep [x]  #21 Impression []  Delivery  Next Visit: Monolithic Zirconia crown delivery for tooth #21.   Provider: Curly Challenger, DDS Candidate, Class of 2026 Attending: Dr. Maryelizabeth Cipro Date: 10/25/2024  Time: 7:28 AM

## 2024-11-02 ENCOUNTER — Ambulatory Visit: Admitting: Student in an Organized Health Care Education/Training Program

## 2024-11-02 ENCOUNTER — Encounter: Payer: Self-pay | Admitting: Student in an Organized Health Care Education/Training Program

## 2024-11-02 VITALS — BP 138/73 | HR 80 | Temp 97.4°F | Resp 16 | Ht 71.0 in | Wt 207.0 lb

## 2024-11-02 DIAGNOSIS — S22080A Wedge compression fracture of T11-T12 vertebra, initial encounter for closed fracture: Secondary | ICD-10-CM | POA: Insufficient documentation

## 2024-11-02 DIAGNOSIS — M5442 Lumbago with sciatica, left side: Secondary | ICD-10-CM | POA: Insufficient documentation

## 2024-11-02 DIAGNOSIS — S32010S Wedge compression fracture of first lumbar vertebra, sequela: Secondary | ICD-10-CM | POA: Insufficient documentation

## 2024-11-02 DIAGNOSIS — S22080S Wedge compression fracture of T11-T12 vertebra, sequela: Secondary | ICD-10-CM | POA: Insufficient documentation

## 2024-11-02 DIAGNOSIS — M5136 Other intervertebral disc degeneration, lumbar region with discogenic back pain only: Secondary | ICD-10-CM | POA: Insufficient documentation

## 2024-11-02 DIAGNOSIS — M5134 Other intervertebral disc degeneration, thoracic region: Secondary | ICD-10-CM | POA: Diagnosis present

## 2024-11-02 DIAGNOSIS — M47816 Spondylosis without myelopathy or radiculopathy, lumbar region: Secondary | ICD-10-CM | POA: Insufficient documentation

## 2024-11-02 DIAGNOSIS — S32010A Wedge compression fracture of first lumbar vertebra, initial encounter for closed fracture: Secondary | ICD-10-CM | POA: Insufficient documentation

## 2024-11-02 DIAGNOSIS — G8929 Other chronic pain: Secondary | ICD-10-CM | POA: Diagnosis present

## 2024-11-02 DIAGNOSIS — G894 Chronic pain syndrome: Secondary | ICD-10-CM | POA: Insufficient documentation

## 2024-11-02 MED ORDER — BUPRENORPHINE 7.5 MCG/HR TD PTWK: 1.0000 | MEDICATED_PATCH | TRANSDERMAL | 0 refills | Status: DC

## 2024-11-02 MED ORDER — BUPRENORPHINE 5 MCG/HR TD PTWK: 1.0000 | MEDICATED_PATCH | TRANSDERMAL | 0 refills | Status: DC

## 2024-11-02 NOTE — Progress Notes (Signed)
 PROVIDER NOTE: Interpretation of information contained herein should be left to medically-trained personnel. Specific patient instructions are provided elsewhere under Patient Instructions section of medical record. This document was created in part using AI and STT-dictation technology, any transcriptional errors that may result from this process are unintentional.  Patient: Mitchell Stephens.  Service: E/M Encounter  Provider: Wallie Sherry, MD  DOB: 1946-10-03  Delivery: Face-to-face  Specialty: Interventional Pain Management  MRN: [id number]  Setting: Ambulatory outpatient facility  Specialty designation: 09  Type: New Patient  Location: Outpatient office facility  PCP: Caro Harlene POUR, NP  DOS: 11/02/2024    Referring Prov.: Claudene Penne ORN, MD   Primary Reason(s) for Visit: Encounter for initial evaluation of one or more chronic problems (new to examiner) potentially causing chronic pain, and posing a threat to normal musculoskeletal function. (Level of risk: High) CC: Back Pain  HPI  Mitchell Stephens is a 18 y.o. year old, male patient, who comes for the first time to our practice referred by Claudene Penne ORN, MD for our initial evaluation of his chronic pain. He has Essential hypertension, benign; Squamous cell carcinoma (HCC); Hyperlipidemia; Hyperglycemia; Chronic constipation; Obstructive sleep apnea on CPAP; Depression, major, in remission; Atherosclerosis of native coronary artery of native heart with stable angina pectoris; Cardiomyopathy (HCC); Paroxysmal tachycardia (HCC); Degenerative joint disease of hand; Chronic gout without tophus; Left leg pain; Compression fracture of T12 vertebra (HCC); Compression fracture of L1 lumbar vertebra (HCC); Chronic left-sided low back pain with left-sided sciatica; Degeneration of intervertebral disc of lumbar region with discogenic back pain; and Chronic pain syndrome on their problem list. Today he comes in for evaluation of his Back  Pain  Pain Assessment: Location: Lower Back Radiating: Radiates down left leg to ankle Onset: More than a month ago Duration: Chronic pain Quality: Aching Severity: 3 /10 (subjective, self-reported pain score)  Effect on ADL: Limits activities Timing: Constant Modifying factors: rest BP: 138/73  HR: 80  Onset and Duration: Gradual Cause of pain: Trauma Severity: No change since onset Timing: During activity or exercise Aggravating Factors: Bending, Lifiting, Prolonged standing, Squatting, Stooping , Walking, Walking uphill, and Walking downhill Alleviating Factors: Lying down, Resting, and Sitting Associated Problems: Constipation, Night-time cramps, Numbness, and Tingling Quality of Pain: Intermittent and Toothache-like Previous Examinations or Tests: Neurosurgical evaluation Previous Treatments: The patient denies none listed  Mitchell Stephens is being evaluated for possible interventional pain management therapies for the treatment of his chronic pain.   Discussed the use of AI scribe software for clinical note transcription with the patient, who gave verbal consent to proceed.  History of Present Illness   Mitchell Stephens is a 79 year old male with osteoporosis who presents with chronic lower back and leg pain following a fall. He was referred by Dr. Claudene and the neurosurgical team for evaluation of his chronic pain.  In July 2024, he experienced a fall from a dock into a boat, landing on his buttocks, resulting in an acute compression fracture. He did not seek immediate medical attention but later consulted a neurosurgeon who provided a brace. He wore the brace and experienced significant pain, requiring a year of healing during which he was largely sedentary.  Prior to the fall, he was active, walking two miles daily without pain. Post-fall, he was unable to walk until November 2024, when he began physical therapy for six to eight weeks. He gradually increased  his walking distance to a mile a day. However, he began  experiencing pain in his left thigh, lower back, and buttocks, which radiated down his left side during walks. This pain was different from previous sciatica episodes, as it was not a shooting pain but rather occurred with exercise.  He underwent x-rays which, according to the radiology report he read, showed retroesthesis at L2 and L4. He also has a new fracture at T12, identified since his last MRI in 2024. He has osteoporosis, which contributes to his bone fragility.  He experiences weakness in his leg, feeling as though it might collapse when in pain. No pain in the mid-back but confirms the majority of pain is in the lower back. No pain in the groin.  He manages his pain with Tylenol , taking approximately 1000 mg daily, and occasionally uses Celebrex at a low dose. He is cautious with Tylenol  due to potential liver issues. He frequently uses heat for pain relief, sitting on a heating chair and sofa.  He has a planned [place] cruise in March, which involves significant walking, and he is concerned about his ability to participate due to his pain.       Meds  Current Medications[1]  Imaging Review  DG Lumbar Spine Complete  Narrative EXAM: 4 VIEW(S) XRAY OF THE LUMBAR SPINE 09/18/2024 01:45:49 PM  COMPARISON: Comparison with outside MRI 05/04/2023.  CLINICAL HISTORY: back and left outer thigh pain  FINDINGS:  LUMBAR SPINE: BONES: Unchanged chronic compression fracture of L1. There is a new compression fracture of T12 which is age indeterminate. 3 mm of retrolisthesis of L2 and 4 mm of retrolisthesis of L3 on neutral view. This does not substantially change on flexion or extension. Vertebral body heights are maintained for the remaining visualized lumbar vertebrae.  DISCS AND DEGENERATIVE CHANGES: Advanced disc space height loss at L5 - S1. Moderate facet arthropathy at L5 - S1. No severe degenerative changes are  noted at other visualized lumbar levels.  SOFT TISSUES: No acute abnormality.  IMPRESSION: 1. Age-indeterminate compression fracture of T12. 2. Chronic L1 compression fracture unchanged from 05/04/2023. 3. Mild Retrolisthesis of L2 and L3 without substantial change on flexion or extension.  Electronically signed by: Norman Gatlin MD 09/28/2024 12:19 AM EST RP Workstation: [id number]  Narrative EXAM: 2 or more VIEW(S) XRAY OF THE BILATERAL HIP 09/18/2024 01:45:49 PM  COMPARISON: None available.  CLINICAL HISTORY: Left Hip Pain  FINDINGS:  BONES AND JOINTS: Mild degenerative changes of bilateral hip joints with mild joint space narrowing and osteophytosis of the superior acetabulum.  SOFT TISSUES: Vascular calcifications noted.  IMPRESSION: 1. Mild bilateral hip osteoarthritis.  Electronically signed by: Norman Gatlin MD 09/28/2024 12:15 AM EST RP Workstation: [id number]  DG Knee Complete 4 Views Left  Narrative CLINICAL DATA:  Left lateral knee pain for 3-4 months, increasing now. No known injury.  EXAM: LEFT KNEE - COMPLETE 4+ VIEW  COMPARISON:  None.  FINDINGS: Early chondrocalcinosis. Mild joint space narrowing in the medial and patellofemoral compartments. Early spurring in the patellofemoral compartment. No acute bony abnormality. Specifically, no fracture, subluxation, or dislocation. Soft tissues are intact.  IMPRESSION: Chondrocalcinosis. Mild degenerative changes as above. No acute findings.   Electronically Signed By: Franky Crease M.D. On: 01/14/2015 15:56   Complexity Note: Imaging results reviewed.                         ROS  Cardiovascular: Daily Aspirin  intake, Heart attack ( Date: 2016), and Heart surgery Pulmonary or Respiratory: Temporary stoppage of breathing during sleep  Neurological: No reported neurological signs or symptoms such as seizures, abnormal skin sensations, urinary and/or fecal incontinence, being born with an  abnormal open spine and/or a tethered spinal cord Psychological-Psychiatric: No reported psychological or psychiatric signs or symptoms such as difficulty sleeping, anxiety, depression, delusions or hallucinations (schizophrenial), mood swings (bipolar disorders) or suicidal ideations or attempts Gastrointestinal: No reported gastrointestinal signs or symptoms such as vomiting or evacuating blood, reflux, heartburn, alternating episodes of diarrhea and constipation, inflamed or scarred liver, or pancreas or irrregular and/or infrequent bowel movements Genitourinary: No reported renal or genitourinary signs or symptoms such as difficulty voiding or producing urine, peeing blood, non-functioning kidney, kidney stones, difficulty emptying the bladder, difficulty controlling the flow of urine, or chronic kidney disease Hematological: No reported hematological signs or symptoms such as prolonged bleeding, low or poor functioning platelets, bruising or bleeding easily, hereditary bleeding problems, low energy levels due to low hemoglobin or being anemic Endocrine: No reported endocrine signs or symptoms such as high or low blood sugar, rapid heart rate due to high thyroid levels, obesity or weight gain due to slow thyroid or thyroid disease Rheumatologic: No reported rheumatological signs and symptoms such as fatigue, joint pain, tenderness, swelling, redness, heat, stiffness, decreased range of motion, with or without associated rash Musculoskeletal: Negative for myasthenia gravis, muscular dystrophy, multiple sclerosis or malignant hyperthermia Work History: Retired  Allergies  Mitchell Stephens is allergic to pollen extract.  Laboratory Chemistry Profile   Renal Lab Results  Component Value Date   BUN 22 08/07/2024   CREATININE 1.49 (H) 08/07/2024   BCR 15 08/07/2024   GFRAA 52 (L) 03/14/2021   GFRNONAA 45 (L) 03/14/2021   SPECGRAV 1.010 05/09/2019   PHUR 6.5 05/09/2019   PROTEINUR Negative  05/09/2019     Electrolytes Lab Results  Component Value Date   NA 136 08/07/2024   K 4.5 08/07/2024   CL 101 08/07/2024   CALCIUM 9.4 08/07/2024     Hepatic Lab Results  Component Value Date   AST 24 08/07/2024   ALT 22 08/07/2024   ALBUMIN 4.0 05/10/2018   ALKPHOS 41 05/10/2018   LIPASE 30 05/10/2018     ID No results found for: LYMEIGGIGMAB, HIV, SARSCOV2NAA, STAPHAUREUS, MRSAPCR, HCVAB, PREGTESTUR, RMSFIGG, QFVRPH1IGG, QFVRPH2IGG   Bone No results found for: VD25OH, CI874NY7UNU, CI6874NY7, CI7874NY7, 25OHVITD1, 25OHVITD2, 25OHVITD3, TESTOFREE, TESTOSTERONE   Endocrine Lab Results  Component Value Date   GLUCOSE 104 (H) 08/07/2024   HGBA1C 5.8 (H) 08/07/2024     Neuropathy Lab Results  Component Value Date   HGBA1C 5.8 (H) 08/07/2024     CNS No results found for: COLORCSF, APPEARCSF, RBCCOUNTCSF, WBCCSF, POLYSCSF, LYMPHSCSF, EOSCSF, PROTEINCSF, GLUCCSF, JCVIRUS, CSFOLI, IGGCSF, LABACHR, ACETBL   Inflammation (CRP: Acute  ESR: Chronic) No results found for: CRP, ESRSEDRATE, LATICACIDVEN   Rheumatology Lab Results  Component Value Date   LABURIC 6.8 01/31/2024     Coagulation Lab Results  Component Value Date   INR 1.01 06/25/2017   LABPROT 13.2 06/25/2017   PLT 261 08/07/2024     Cardiovascular Lab Results  Component Value Date   TROPONINI 3 03/14/2021   HGB 13.5 08/07/2024   HCT 41.3 08/07/2024     Screening No results found for: SARSCOV2NAA, COVIDSOURCE, STAPHAUREUS, MRSAPCR, HCVAB, HIV, PREGTESTUR   Cancer No results found for: CEA, CA125, LABCA2   Allergens No results found for: ALMOND, APPLE, ASPARAGUS, AVOCADO, BANANA, BARLEY, BASIL, BAYLEAF, GREENBEAN, LIMABEAN, WHITEBEAN, BEEFIGE, REDBEET, BLUEBERRY, BROCCOLI, CABBAGE, MELON, CARROT, CASEIN, CASHEWNUT, CAULIFLOWER, CELERY  Note: Lab results  reviewed.  PFSH  Drug: Mitchell Stephens  reports no history of drug use. Alcohol:  reports no history of alcohol use. Tobacco:  reports that he quit smoking about 10 years ago. His smoking use included cigarettes. He started smoking about 50 years ago. He has never used smokeless tobacco. Medical:  has a past medical history of Actinic keratosis (02/10/2021), Alcoholism (HCC), Allergy, Basal cell carcinoma (05/13/2020), Basal cell carcinoma (08/26/2020), Basal cell carcinoma (02/10/2021), Basal cell carcinoma (12/17/2021), Basal cell carcinoma (06/03/2022), Basal cell carcinoma (06/02/2023), Basal cell carcinoma (06/02/2023), Coronary artery disease, Depression with anxiety, Gastric ulcer, acute with hemorrhage (2001), H/O congenital atrial septal defect (ASD) repair, Heart murmur, Helicobacter pylori gastritis, Heme positive stool, History of atrial septal defect repair (10/08/2016), History of gout, History of MRSA infection, History of peptic ulcer disease, History of PSVT (paroxysmal supraventricular tachycardia), History of tobacco abuse (12/13/2017), Hyperlipidemia, Hypertension, Ischemic cardiomyopathy, Myocardial infarction (HCC) (2018), Obstructive sleep apnea, Perennial allergic rhinitis, Sleep apnea, 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 Family: family history includes Arthritis in his mother; Brain cancer in his father and sister; Cancer in his paternal grandfather; Failure to thrive in his mother; Hypertension in his sister; Liver cancer in his paternal uncle; Other in his mother, sister, and another family member; Stomach cancer in his maternal  grandmother.  Past Surgical History:  Procedure Laterality Date   ASD REPAIR, OSTIUM PRIMUM  1966   basal cell carcinoma removal     COLONOSCOPY     EAR BIOPSY Left 2013   Dr.Cook--squamous cell ca ([facility])   LEFT HEART CATH AND CORONARY ANGIOGRAPHY Left 06/30/2017   Procedure: LEFT HEART CATH AND CORONARY ANGIOGRAPHY;  Surgeon: Perla Evalene PARAS, MD;  Location: [facility] INVASIVE CV LAB;  Service: Cardiovascular;  Laterality: Left;   MOHS SURGERY     ear   open heart surgery  1966   Dr. Neysa ([facility]); was PFO repair--has been normal in f/u   PALATE / UVULA BIOPSY / EXCISION  2000   Dr.Sprenhe [facility])   TONSILLECTOMY AND ADENOIDECTOMY  1959   TOOTH EXTRACTION  01/06/2023   Dentist   UVULECTOMY     Active Ambulatory Problems    Diagnosis Date Noted   Essential hypertension, benign 10/15/2014   Squamous cell carcinoma (HCC) 10/15/2014   Hyperlipidemia 10/15/2014   Hyperglycemia 10/15/2014   Chronic constipation 10/15/2014   Obstructive sleep apnea on CPAP 10/15/2014   Depression, major, in remission 01/09/2016   Atherosclerosis of native coronary artery of native heart with stable angina pectoris 08/31/2017   Cardiomyopathy (HCC) 12/13/2017   Paroxysmal tachycardia (HCC) 04/14/2018   Degenerative joint disease of hand 01/17/2016   Chronic gout without tophus 01/31/2024   Left leg pain 09/18/2024   Compression fracture of T12 vertebra (HCC) 11/02/2024   Compression fracture of L1 lumbar vertebra (HCC) 11/02/2024   Chronic left-sided low back pain with left-sided sciatica 11/02/2024   Degeneration of intervertebral disc of lumbar region with discogenic back pain 11/02/2024   Chronic pain syndrome 11/02/2024   Resolved Ambulatory Problems    Diagnosis Date Noted   Tobacco abuse 10/15/2014   Metacarpophalangeal joint pain 01/09/2016   History of atrial septal defect repair 10/08/2016   Unstable angina (HCC) 06/30/2017  Positive cardiac stress test 06/30/2017   History of tobacco  abuse 12/13/2017   Personal history of other malignant neoplasm of skin 06/23/2012   Past Medical History:  Diagnosis Date   Actinic keratosis 02/10/2021   Alcoholism (HCC)    Allergy    Basal cell carcinoma 05/13/2020   Basal cell carcinoma 08/26/2020   Basal cell carcinoma 02/10/2021   Basal cell carcinoma 12/17/2021   Basal cell carcinoma 06/03/2022   Basal cell carcinoma 06/02/2023   Basal cell carcinoma 06/02/2023   Coronary artery disease    Depression with anxiety    Gastric ulcer, acute with hemorrhage 2001   H/O congenital atrial septal defect (ASD) repair    Heart murmur    Helicobacter pylori gastritis    Heme positive stool    History of gout    History of MRSA infection    History of peptic ulcer disease    History of PSVT (paroxysmal supraventricular tachycardia)    Hypertension    Ischemic cardiomyopathy    Myocardial infarction (HCC) 2018   Obstructive sleep apnea    Perennial allergic rhinitis    Sleep apnea    Squamous cell carcinoma in situ 11/20/2021   Squamous cell carcinoma in situ 11/20/2021   Squamous cell carcinoma in situ 11/20/2021   Squamous cell carcinoma of skin 01/29/2020   Squamous cell carcinoma of skin 08/13/2021   Squamous cell carcinoma of skin 11/20/2021   Squamous cell carcinoma of skin 11/20/2021   Squamous cell carcinoma of skin 11/20/2021   Constitutional Exam  General appearance: Well nourished, well developed, and well hydrated. In no apparent acute distress Vitals:   11/02/24 0903  BP: 138/73  Pulse: 80  Resp: 16  Temp: (!) 97.4 F (36.3 C)  SpO2: 99%  Weight: 207 lb (93.9 kg)  Height: 5' 11 (1.803 m)   BMI Assessment: Estimated body mass index is 28.87 kg/m as calculated from the following:   Height as of this encounter: 5' 11 (1.803 m).   Weight as of this encounter: 207 lb (93.9 kg).  BMI interpretation table: BMI level Category Range association with higher incidence of chronic pain  <18 kg/m2 Underweight    18.5-24.9 kg/m2 Ideal body weight   25-29.9 kg/m2 Overweight Increased incidence by 20%  30-34.9 kg/m2 Obese (Class I) Increased incidence by 68%  35-39.9 kg/m2 Severe obesity (Class II) Increased incidence by 136%  >40 kg/m2 Extreme obesity (Class III) Increased incidence by 254%   Patient's current BMI Ideal Body weight  Body mass index is 28.87 kg/m. Ideal body weight: 75.3 kg (166 lb 0.1 oz) Adjusted ideal body weight: 82.7 kg (182 lb 6.5 oz)   BMI Readings from Last 4 Encounters:  11/02/24 28.87 kg/m  09/18/24 29.12 kg/m  08/07/24 29.65 kg/m  02/03/24 29.29 kg/m   Wt Readings from Last 4 Encounters:  11/02/24 207 lb (93.9 kg)  09/18/24 208 lb 12.8 oz (94.7 kg)  08/07/24 212 lb 9.6 oz (96.4 kg)  02/03/24 210 lb (95.3 kg)    Psych/Mental status: Alert, oriented x 3 (person, place, & time)       Eyes: PERLA Respiratory: No evidence of acute respiratory distress  Thoracic Spine Area Exam  Skin & Axial Inspection: No masses, redness, or swelling Alignment: Symmetrical Functional ROM: Pain restricted ROM Stability: No instability detected Muscle Tone/Strength: Functionally intact. No obvious neuro-muscular anomalies detected. Sensory (Neurological): Musculoskeletal pain pattern Muscle strength & Tone: No palpable anomalies Lumbar Spine Area Exam  Skin & Axial Inspection:  No masses, redness, or swelling Alignment: Symmetrical Functional ROM: Pain restricted ROM       Stability: No instability detected Muscle Tone/Strength: Functionally intact. No obvious neuro-muscular anomalies detected. Sensory (Neurological): Musculoskeletal pain pattern Palpation: Complains of area being tender to palpation        Gait & Posture Assessment  Ambulation: Unassisted Gait: Relatively normal for age and body habitus Posture: WNL  Lower Extremity Exam    Side: Right lower extremity  Side: Left lower extremity  Stability: No instability observed          Stability: No instability  observed          Skin & Extremity Inspection: Skin color, temperature, and hair growth are WNL. No peripheral edema or cyanosis. No masses, redness, swelling, asymmetry, or associated skin lesions. No contractures.  Skin & Extremity Inspection: Skin color, temperature, and hair growth are WNL. No peripheral edema or cyanosis. No masses, redness, swelling, asymmetry, or associated skin lesions. No contractures.  Functional ROM: Unrestricted ROM                  Functional ROM: Unrestricted ROM                  Muscle Tone/Strength: Functionally intact. No obvious neuro-muscular anomalies detected.  Muscle Tone/Strength: Functionally intact. No obvious neuro-muscular anomalies detected.  Sensory (Neurological): Unimpaired        Sensory (Neurological): Unimpaired        DTR: Patellar: deferred today Achilles: deferred today Plantar: deferred today  DTR: Patellar: deferred today Achilles: deferred today Plantar: deferred today  Palpation: No palpable anomalies  Palpation: No palpable anomalies    Assessment  Primary Diagnosis & Pertinent Problem List: The primary encounter diagnosis was Compression fracture of T12 vertebra, sequela. Diagnoses of Compression fracture of L1 vertebra, sequela, Chronic left-sided low back pain with left-sided sciatica, Lumbar facet arthropathy, Lumbar spondylosis, Degeneration of intervertebral disc of lumbar region with discogenic back pain, Chronic pain syndrome, and Other intervertebral disc degeneration, thoracic region were also pertinent to this visit.  Visit Diagnosis (New problems to examiner): 1. Compression fracture of T12 vertebra, sequela   2. Compression fracture of L1 vertebra, sequela   3. 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 Lumbar facet arthropathy   5. Lumbar spondylosis   6. Degeneration of intervertebral disc of lumbar region with discogenic back pain   7. Chronic pain syndrome   8. Other intervertebral disc  degeneration, thoracic region    Plan of Care (Initial workup plan)  Assessment and Plan    Chronic left-sided low back pain with left-sided sciatica and lumbar spondylosis   He experiences chronic left-sided low back pain with sciatica, worsened by walking and physical activity, with pain radiating to the left thigh and buttocks. A new MRI is needed to assess the current status, as the previous one from 2024 is outdated. The differential diagnosis includes arthritis and changes from previous compression fractures. He reports no groin pain. Current pain management with Tylenol  and low-dose Celebrex provides limited relief. A buprenorphine  patch is considered for chronic pain management, requiring prior authorization. The low risk of buprenorphine  compared to other opioids and its potential to maintain baseline pain control was discussed. An MRI of the lumbar spine is ordered. A buprenorphine  patch is prescribed, starting at 5 mcg/hour for the first month, increasing to 7.5 mcg/hour in the second month. He is instructed to alternate patch sites weekly and avoid using heating pads over  the patch. Continue Tylenol  and Celebrex as needed.  Sequelae of T12 and L1 vertebral compression fractures   He has T12 and L1 vertebral compression fractures with associated chronic pain. A new MRI is needed to assess the current status, as the previous one from 2024 is outdated. Kyphoplasty is not recommended due to the risk of further fractures in osteoporotic bone. The potential for further fractures with kyphoplasty due to osteoporosis was discussed, and the decision to avoid it was made. An MRI of the thoracic spine is ordered. Continue to avoid kyphoplasty.  Chronic pain syndrome   His chronic pain syndrome significantly impacts his quality of life and mobility. Pain management includes Tylenol  and Celebrex, with limited relief. A buprenorphine  patch is considered for chronic pain management, requiring prior  authorization. The potential benefits of buprenorphine  in maintaining baseline pain control and reducing pain fluctuations were discussed. A buprenorphine  patch is prescribed as outlined in the plan for chronic left-sided low back pain. Continue the current pain management regimen with Tylenol  and Celebrex.         Imaging Orders         MR THORACIC SPINE WO CONTRAST         MR LUMBAR SPINE WO CONTRAST     1. Compression fracture of T12 vertebra, sequela (Primary) - MR THORACIC SPINE WO CONTRAST; Future - buprenorphine  (BUTRANS ) 5 MCG/HR PTWK; Place 1 patch onto the skin once a week for 28 days.  Dispense: 4 patch; Refill: 0 - buprenorphine  (BUTRANS ) 7.5 MCG/HR; Place 1 patch onto the skin once a week for 28 days.  Dispense: 4 patch; Refill: 0  2. Compression fracture of L1 vertebra, sequela - MR LUMBAR SPINE WO CONTRAST; Future - buprenorphine  (BUTRANS ) 5 MCG/HR PTWK; Place 1 patch onto the skin once a week for 28 days.  Dispense: 4 patch; Refill: 0 - buprenorphine  (BUTRANS ) 7.5 MCG/HR; Place 1 patch onto the skin once a week for 28 days.  Dispense: 4 patch; Refill: 0  3. Chronic left-sided low back pain with left-sided sciatica  4. Lumbar facet arthropathy - MR LUMBAR SPINE WO CONTRAST; Future  5. 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 Chronic pain syndrome - MR LUMBAR SPINE WO CONTRAST; Future - buprenorphine  (BUTRANS ) 5 MCG/HR PTWK; Place 1 patch onto the skin once a week for 28 days.  Dispense: 4 patch; Refill: 0 - buprenorphine  (BUTRANS ) 7.5 MCG/HR; Place 1 patch onto the skin once a week for 28 days.  Dispense: 4 patch; Refill: 0  8. Other intervertebral disc degeneration, thoracic region - MR THORACIC SPINE WO CONTRAST; Future    Pharmacotherapy (current): Medications ordered:  Meds ordered this encounter  Medications   buprenorphine  (BUTRANS ) 5 MCG/HR PTWK     Sig: Place 1 patch onto the skin once a week for 28 days.    Dispense:  4 patch    Refill:  0   buprenorphine  (BUTRANS ) 7.5 MCG/HR    Sig: Place 1 patch onto the skin once a week for 28 days.    Dispense:  4 patch    Refill:  0   Medications administered during this visit: Mitchell DEL. Easterday Jr. Stephens had no medications administered during this visit.   Interventional management options: Mitchell Stephens was informed that there is no guarantee that he would be a candidate for interventional therapies. The decision will be based on the results of diagnostic studies,  as well as Mitchell Stephens's risk profile.  Procedure(s) under consideration:  Pending results of ordered studies    Provider-requested follow-up: Return in about 3 weeks (around 11/23/2024) for 2nd pt visit- discuss imaging.  Future Appointments  Date Time Provider Department Center  02/05/2025  9:20 AM Caro Harlene POUR, NP [facility] [address]  10/11/2025  9:40 AM Caro Harlene POUR, NP [facility] [address]   I discussed the assessment and treatment plan with the patient. The patient was provided an opportunity to ask questions and all were answered. The patient agreed with the plan and demonstrated an understanding of the instructions.  Patient advised to call back or seek an in-person evaluation if the symptoms or condition worsens.  I personally spent a total of 60 minutes in the care of the patient today including preparing to see the patient, getting/reviewing separately obtained history, performing a medically appropriate exam/evaluation, counseling and educating, placing orders, and documenting clinical information in the EHR.  Note by: Wallie Sherry, MD (TTS and AI technology used. I apologize for any typographical errors that were not detected and corrected.) Date: 11/02/2024; Time: 9:51 AM     [1]  Current Outpatient Medications:    aspirin  EC 81 MG tablet, Take 81 mg by mouth daily., Disp: , Rfl:     buprenorphine  (BUTRANS ) 5 MCG/HR PTWK, Place 1 patch onto the skin once a week for 28 days., Disp: 4 patch, Rfl: 0   [START ON 11/30/2024] buprenorphine  (BUTRANS ) 7.5 MCG/HR, Place 1 patch onto the skin once a week for 28 days., Disp: 4 patch, Rfl: 0   buPROPion  (WELLBUTRIN  XL) 300 MG 24 hr tablet, TAKE ONE TABLET BY MOUTH ONE TIME DAILY, Disp: 90 tablet, Rfl: 1   ezetimibe  (ZETIA ) 10 MG tablet, TAKE ONE TABLET BY MOUTH ONE TIME DAILY, Disp: 90 tablet, Rfl: 2   gabapentin  (NEURONTIN ) 100 MG capsule, Take 1 capsule (100 mg total) by mouth 3 (three) times daily as needed., Disp: 90 capsule, Rfl: 1   hydrochlorothiazide  (HYDRODIURIL ) 25 MG tablet, TAKE ONE TABLET BY MOUTH ONE TIME DAILY AS NEEDED, Disp: 90 tablet, Rfl: 1   isosorbide  mononitrate (IMDUR ) 30 MG 24 hr tablet, TAKE ONE-HALF TABLET BY MOUTH ONE TIME DAILY, Disp: 45 tablet, Rfl: 2   losartan  (COZAAR ) 25 MG tablet, TAKE ONE TABLET BY MOUTH ONE TIME DAILY, Disp: 90 tablet, Rfl: 1   metoprolol  succinate (TOPROL -XL) 100 MG 24 hr tablet, TAKE ONE TABLET BY MOUTH ONE TIME DAILY WITH OR IMMEDIATELY FOLLOWING A MEAL, Disp: 90 tablet, Rfl: 1   nitroGLYCERIN  (NITROSTAT ) 0.4 MG SL tablet, Place 1 tablet (0.4 mg total) under the tongue every 5 (five) minutes as needed for chest pain., Disp: 25 tablet, Rfl: 7   senna (SENOKOT) 8.6 MG tablet, Take 2 tablets by mouth at bedtime., Disp: , Rfl:    sertraline  (ZOLOFT ) 50 MG tablet, TAKE ONE TABLET (50 MG TOTAL) BY MOUTH ONE TIME DAILY, Disp: 90 tablet, Rfl: 1   simvastatin  (ZOCOR ) 40 MG tablet, TAKE ONE TABLET BY MOUTH ONE TIME DAILY, Disp: 90 tablet, Rfl: 1   spironolactone  (ALDACTONE ) 25 MG tablet, TAKE ONE TABLET BY MOUTH ONE TIME DAILY, Disp: 90 tablet, Rfl: 1   traZODone  (DESYREL ) 50 MG tablet, TAKE ONE TABLET BY MOUTH AT BEDTIME AS NEEDED FOR INSOMNIA, Disp: 90 tablet, Rfl: 1

## 2024-11-02 NOTE — Progress Notes (Signed)
 Safety precautions to be maintained throughout the outpatient stay will include: orient to surroundings, keep bed in low position, maintain call bell within reach at all times, provide assistance with transfer out of bed and ambulation.

## 2024-11-02 NOTE — Patient Instructions (Signed)
 Buprenorphine  Patches What is this medication? BUPRENORPHINE  (byoo pre NOR feen) treats severe, chronic pain. It is prescribed when other pain medications have not worked or cannot be tolerated. It works by blocking pain signals in the brain. It belongs to a group of medications called opioids. This medication is long-acting. Do not use it to treat sudden pain. This medicine may be used for other purposes; ask your health care provider or pharmacist if you have questions. COMMON BRAND NAME(S): Butrans  What should I tell my care team before I take this medication? They need to know if you have any of these conditions: Brain tumor Frequently drink alcohol Gallbladder disease Head injury Heart disease Irregular heartbeat or rhythm Liver disease Low adrenal gland function Lung or breathing disease, such as asthma or COPD Pancreatic disease Seizures Sleep apnea Stomach or intestine problems Substance use disorder Taken an MAOI, such as Marplan, Nardil, or Parnate in the last 14 days An unusual or allergic reaction to buprenorphine , other medications, foods, dyes, or preservatives Pregnant or trying to get pregnant Breastfeeding How should I use this medication? This medication is for external use only. Apply the patch to your skin. Select a clean, dry area of skin on your upper outer arm, upper chest, upper back, or the side of the chest. The upper back is a good spot to put the patch on people who are confused because it will be hard for them to remove the patch. Do not apply the patch to oily, broken, burned, cut, or irritated skin. Use only water to clean the area. Do not use soap or alcohol to clean the skin because this can increase the effects of the medication. If the area is hairy, clip the hair with scissors, but do not shave. Do not cut or damage the patch. A cut or damaged patch can be very dangerous because you may get too much medication. Take the patch out of its wrapper, and take  off the protective strip over the sticky part. Do not use a patch if the packaging or backing is damaged. Do not touch the sticky part with your fingers. Press the sticky surface to the skin using the palm of your hand. Press the patch to the skin for 15 seconds. Wash your hands at once with soap and water. Keep patches far away from children. Do not let children see you apply the patch and do not apply it where children can see it. Do not call the patch a sticker, tattoo, or bandage, as this could encourage the child to mimic your actions. Used patches still contain medication. Children or pets can have serious side effects or die from putting used patches in their mouth or on their bodies. Take off the old patch before putting on a new patch. Apply each new patch to a different area of skin. If a patch comes off or causes irritation, remove it and apply a new patch to different site. If the edges of the patch start to loosen, apply first aid tape to the edges of the patch. If problems with the patch not sticking continue, cover the patch with a see-through adhesive dressing (like Bioclusive or Tegaderm). Never cover the patch with any other bandage or tape. A special MedGuide will be given to you by the pharmacist with each prescription and refill. Be sure to read this information carefully each time. This medication comes with INSTRUCTIONS FOR USE. Ask your pharmacist for directions on how to use this medication. Read the information  carefully. Talk to your pharmacist or care team if you have questions. Talk to your care team about the use of this medication in children. Special care may be needed. Overdosage: If you think you have taken too much of this medicine contact a poison control center or emergency room at once. NOTE: This medicine is only for you. Do not share this medicine with others. What if I miss a dose? If you miss a dose, use it as soon as you can. If it is almost time for your next  dose, use only that dose. Do not use double or extra doses. What may interact with this medication? Do not take this medication with any of the following: Cisapride Dronedarone Pimozide Safinamide Samidorphan Thioridazine This medication may also interact with the following: Alcohol Antihistamines for allergy, cough, and cold Atropine Benzodiazepines, such as alprazolam, diazepam, or lorazepam Certain antibiotics, such as clarithromycin, erythromycin Certain antivirals for hepatitis or HIV Certain medications for bladder problems, such as oxybutynin or tolterodine Certain medications for depression or mental health conditions Certain medications for fungal infections, such as fluconazole, ketoconazole, posaconazole Certain medications for migraine headache like almotriptan, eletriptan, frovatriptan, naratriptan, rizatriptan, sumatriptan, zolmitriptan Certain medications for nausea or vomiting like dolasetron, ondansetron , palonosetron Certain medications for seizures, such as carbamazepine, phenobarbital, phenytoin Certain medications for stomach problems, such as dicyclomine or hyoscyamine Certain medications for travel sickness, such as scopolamine Certain medications for Parkinson disease, such as benztropine or trihexyphenidyl Ipratropium Linezolid MAOIs, such as Marplan, Nardil, and Parnate Medications that cause drowsiness before a procedure, such as propofol Medications that help you fall asleep Medications that relax muscles Methylene blue (injected into a vein) Other medications that cause heart rhythm changes Opioid medications for pain or cough Phenothiazines, such as chlorpromazine, prochlorperazine Rifampin This list may not describe all possible interactions. Give your health care provider a list of all the medicines, herbs, non-prescription drugs, or dietary supplements you use. Also tell them if you smoke, drink alcohol, or use illegal drugs. Some items may  interact with your medicine. What should I watch for while using this medication? Tell your care team if your pain does not go away, if it gets worse, or if you have new or a different type of pain. You may develop tolerance to this medication. Tolerance means that you will need a higher dose of the medication for pain relief. Tolerance is normal and is expected if you take this medication for a long time. Taking this medication with other substances that cause drowsiness, such as alcohol, benzodiazepines, or other opioids can cause serious side effects. Give your care team a list of all medications you use. They will tell you how much medication to take. Do not take more medication than directed. Call emergency services if you have problems breathing or staying awake. Long term use of this medication may cause your brain and body to depend on it. This can happen even when used as directed by your care team. You and your care team will work together to determine how long you will need to take this medication. If your care team wants you to stop this medication, the dose will be slowly lowered over time to reduce the risk of side effects. Naloxone is an emergency medication used for an opioid overdose. An overdose can happen if you take too much of an opioid. It can also happen if an opioid is taken with some other medications or substances such as alcohol. Know the symptoms of an overdose,  such as trouble breathing, unusually tired or sleepy, or not being able to respond or wake up. Make sure to tell caregivers and close contacts where your naloxone is stored. Make sure they know how to use it. After naloxone is given, the person giving it must call emergency services. Naloxone is a temporary treatment. Repeat doses may be needed. This medication may affect your coordination, reaction time, or judgment. Do not drive or operate machinery until you know how this medication affects you. Sit up or stand slowly to  reduce the risk of dizzy or fainting spells. Drinking alcohol with this medication can increase the risk of these side effects. This patch is sensitive to body heat changes. If your skin gets too hot, more medication will come out of the patch and can cause an overdose. Call your care team if you get a fever. Do not take hot baths. Do not sunbathe. Do not use hot tubs, saunas, hairdryers, heating pads, electric blankets, heated waterbeds, or tanning lamps. Do not engage in exercise that increases your body temperature. If you are going to need surgery, an MRI, CT scan, or other procedure, tell your care team that you are using this medication. You may need to remove the patch before the procedure. This medication will cause constipation. If you do not have a bowel movement for 3 days, call your care team. Your mouth may get dry. Chewing sugarless gum or sucking hard candy and drinking plenty of water may help. Contact your care team if the problem does not go away or is severe. Talk to your care team if you may be pregnant. Prolonged use of this medication during pregnancy can cause temporary withdrawal in a newborn. Talk to your care team before breastfeeding. Changes to your treatment plan may be needed. If you breastfeed while taking this medication, seek medical care right away if you notice the child has slow or noisy breathing, is unusually sleepy or not able to wake up, or is limp. Long-term use of this medication may cause infertility. Talk to your care team if you are concerned about your fertility. What side effects may I notice from receiving this medication? Side effects that you should report to your care team as soon as possible: Allergic reactions--skin rash, itching, hives, swelling of the face, lips, tongue, or throat CNS depression--slow or shallow breathing, shortness of breath, feeling faint, dizziness, confusion, trouble staying awake Heart rhythm changes--fast or irregular  heartbeat, dizziness, feeling faint or lightheaded, chest pain, trouble breathing Liver injury--right upper belly pain, loss of appetite, nausea, light-colored stool, dark yellow or brown urine, yellowing skin or eyes, unusual weakness or fatigue Low adrenal gland function--nausea, vomiting, loss of appetite, unusual weakness or fatigue, dizziness Low blood pressure--dizziness, feeling faint or lightheaded, blurry vision Side effects that usually do not require medical attention (report to your care team if they continue or are bothersome): Constipation Dizziness Drowsiness Dry mouth Headache Irritation at application site Nausea Vomiting This list may not describe all possible side effects. Call your doctor for medical advice about side effects. You may report side effects to FDA at 1-800-FDA-1088. Where should I keep my medication? Keep this medication out of reach of children and pets. Store it out of sight in a safe place. Do not share it with others. Misuse of this medication is dangerous and against the law. Store at room temperature between 20 and 25 degrees C (68 and 77 degrees F). Keep this medication in the original packaging until you  are ready to take it. Get rid of any unused medication after the expiration date. This medication may cause harm and death if it is taken by other adults, children, or pets. It is important to get rid of the medication as soon as you no longer need it or it is expired. To get rid of this medication: Take the medication to a take-back program. Check with your pharmacy or law enforcement to find a location. Follow the steps given to you by your pharmacy. You may be given a pre-paid mail-back envelope or disposal product to safely get rid of your medication. If other options are not available, flush the medication down the toilet. NOTE: This sheet is a summary. It may not cover all possible information. If you have questions about this medicine, talk to  your doctor, pharmacist, or health care provider.  2025 [employer] Standard (2023-09-08 00:00:00)

## 2024-11-03 ENCOUNTER — Other Ambulatory Visit: Payer: Self-pay

## 2024-11-03 DIAGNOSIS — G894 Chronic pain syndrome: Secondary | ICD-10-CM

## 2024-11-03 DIAGNOSIS — S32010S Wedge compression fracture of first lumbar vertebra, sequela: Secondary | ICD-10-CM

## 2024-11-03 DIAGNOSIS — S22080S Wedge compression fracture of T11-T12 vertebra, sequela: Secondary | ICD-10-CM

## 2024-11-06 MED ORDER — BUPRENORPHINE 5 MCG/HR TD PTWK: 1.0000 | MEDICATED_PATCH | TRANSDERMAL | 0 refills | Status: AC

## 2024-11-06 MED ORDER — BUPRENORPHINE 7.5 MCG/HR TD PTWK: 1.0000 | MEDICATED_PATCH | TRANSDERMAL | 0 refills | Status: AC

## 2024-11-08 ENCOUNTER — Telehealth: Payer: Self-pay | Admitting: Student in an Organized Health Care Education/Training Program

## 2024-11-08 NOTE — Telephone Encounter (Signed)
 Medications were already sent to [facility] on [address] and patient is aware.

## 2024-11-08 NOTE — Telephone Encounter (Signed)
 Pt needs prescription called in on [facility] on [address] The other pharmacy was to expensive. Pain patch

## 2024-11-17 ENCOUNTER — Ambulatory Visit

## 2025-02-05 ENCOUNTER — Ambulatory Visit: Admitting: Nurse Practitioner

## 2025-10-11 ENCOUNTER — Ambulatory Visit: Admitting: Nurse Practitioner
